# Patient Record
Sex: Female | Born: 1965 | Race: Black or African American | Hispanic: No | Smoking: Never smoker
Health system: Southern US, Community
[De-identification: ages and names within clinical notes are randomized; demographics above are authoritative.]

## PROBLEM LIST (undated history)

## (undated) DIAGNOSIS — I272 Pulmonary hypertension, unspecified: Secondary | ICD-10-CM

## (undated) DIAGNOSIS — Q211 Atrial septal defect: Secondary | ICD-10-CM

## (undated) DIAGNOSIS — D259 Leiomyoma of uterus, unspecified: Secondary | ICD-10-CM

## (undated) DIAGNOSIS — T7840XA Allergy, unspecified, initial encounter: Secondary | ICD-10-CM

## (undated) DIAGNOSIS — D509 Iron deficiency anemia, unspecified: Secondary | ICD-10-CM

## (undated) DIAGNOSIS — L0291 Cutaneous abscess, unspecified: Secondary | ICD-10-CM

## (undated) DIAGNOSIS — R600 Localized edema: Secondary | ICD-10-CM

## (undated) DIAGNOSIS — L723 Sebaceous cyst: Secondary | ICD-10-CM

## (undated) DIAGNOSIS — I10 Essential (primary) hypertension: Secondary | ICD-10-CM

## (undated) DIAGNOSIS — E559 Vitamin D deficiency, unspecified: Secondary | ICD-10-CM

## (undated) DIAGNOSIS — M519 Unspecified thoracic, thoracolumbar and lumbosacral intervertebral disc disorder: Secondary | ICD-10-CM

## (undated) DIAGNOSIS — K56609 Unspecified intestinal obstruction, unspecified as to partial versus complete obstruction: Secondary | ICD-10-CM

## (undated) DIAGNOSIS — D649 Anemia, unspecified: Secondary | ICD-10-CM

## (undated) DIAGNOSIS — E7800 Pure hypercholesterolemia, unspecified: Secondary | ICD-10-CM

## (undated) DIAGNOSIS — L089 Local infection of the skin and subcutaneous tissue, unspecified: Secondary | ICD-10-CM

## (undated) HISTORY — DX: Cutaneous abscess, unspecified: L02.91

## (undated) HISTORY — DX: Iron deficiency anemia, unspecified: D50.9

## (undated) HISTORY — DX: Local infection of the skin and subcutaneous tissue, unspecified: L08.9

## (undated) HISTORY — PX: ENDOMETRIAL ABLATION: SHX621

## (undated) HISTORY — DX: Sebaceous cyst: L72.3

## (undated) HISTORY — DX: Leiomyoma of uterus, unspecified: D25.9

## (undated) HISTORY — PX: OTHER SURGICAL HISTORY: SHX169

## (undated) HISTORY — DX: Essential (primary) hypertension: I10

## (undated) HISTORY — DX: Pulmonary hypertension, unspecified: I27.20

## (undated) HISTORY — DX: Atrial septal defect: Q21.1

## (undated) HISTORY — DX: Allergy, unspecified, initial encounter: T78.40XA

## (undated) HISTORY — DX: Morbid (severe) obesity due to excess calories: E66.01

## (undated) HISTORY — DX: Anemia, unspecified: D64.9

## (undated) HISTORY — PX: ASD REPAIR: SHX258

## (undated) HISTORY — DX: Localized edema: R60.0

## (undated) HISTORY — DX: Unspecified thoracic, thoracolumbar and lumbosacral intervertebral disc disorder: M51.9

## (undated) HISTORY — DX: Unspecified intestinal obstruction, unspecified as to partial versus complete obstruction: K56.609

## (undated) HISTORY — DX: Pure hypercholesterolemia, unspecified: E78.00

## (undated) HISTORY — DX: Vitamin D deficiency, unspecified: E55.9

---

## 1994-07-19 HISTORY — PX: TUBAL LIGATION: SHX77

## 1998-09-24 ENCOUNTER — Other Ambulatory Visit: Admission: RE | Admit: 1998-09-24 | Discharge: 1998-09-24 | Payer: Self-pay | Admitting: Obstetrics

## 2000-03-04 ENCOUNTER — Ambulatory Visit (HOSPITAL_COMMUNITY): Admission: RE | Admit: 2000-03-04 | Discharge: 2000-03-04 | Payer: Self-pay | Admitting: Orthopedic Surgery

## 2000-03-05 ENCOUNTER — Encounter: Payer: Self-pay | Admitting: Orthopedic Surgery

## 2000-03-05 ENCOUNTER — Ambulatory Visit (HOSPITAL_COMMUNITY): Admission: RE | Admit: 2000-03-05 | Discharge: 2000-03-05 | Payer: Self-pay | Admitting: Orthopedic Surgery

## 2000-04-25 ENCOUNTER — Other Ambulatory Visit: Admission: RE | Admit: 2000-04-25 | Discharge: 2000-04-25 | Payer: Self-pay | Admitting: Obstetrics

## 2000-04-28 ENCOUNTER — Encounter: Admission: RE | Admit: 2000-04-28 | Discharge: 2000-04-28 | Payer: Self-pay | Admitting: Obstetrics

## 2000-04-28 ENCOUNTER — Encounter: Payer: Self-pay | Admitting: Obstetrics

## 2000-07-07 ENCOUNTER — Encounter: Admission: RE | Admit: 2000-07-07 | Discharge: 2000-10-05 | Payer: Self-pay | Admitting: Anesthesiology

## 2000-08-09 ENCOUNTER — Other Ambulatory Visit: Admission: RE | Admit: 2000-08-09 | Discharge: 2000-08-09 | Payer: Self-pay | Admitting: Obstetrics

## 2003-04-30 ENCOUNTER — Encounter: Payer: Self-pay | Admitting: Obstetrics

## 2003-04-30 ENCOUNTER — Ambulatory Visit (HOSPITAL_COMMUNITY): Admission: RE | Admit: 2003-04-30 | Discharge: 2003-04-30 | Payer: Self-pay | Admitting: Obstetrics

## 2003-05-13 ENCOUNTER — Encounter: Payer: Self-pay | Admitting: Obstetrics

## 2003-05-13 ENCOUNTER — Encounter: Admission: RE | Admit: 2003-05-13 | Discharge: 2003-05-13 | Payer: Self-pay | Admitting: Obstetrics

## 2003-08-09 ENCOUNTER — Emergency Department (HOSPITAL_COMMUNITY): Admission: AD | Admit: 2003-08-09 | Discharge: 2003-08-09 | Payer: Self-pay | Admitting: Family Medicine

## 2003-08-11 ENCOUNTER — Emergency Department (HOSPITAL_COMMUNITY): Admission: AD | Admit: 2003-08-11 | Discharge: 2003-08-11 | Payer: Self-pay | Admitting: Family Medicine

## 2003-08-13 ENCOUNTER — Emergency Department (HOSPITAL_COMMUNITY): Admission: AD | Admit: 2003-08-13 | Discharge: 2003-08-13 | Payer: Self-pay | Admitting: Family Medicine

## 2003-10-24 ENCOUNTER — Encounter: Admission: RE | Admit: 2003-10-24 | Discharge: 2003-10-24 | Payer: Self-pay | Admitting: Obstetrics

## 2003-12-26 ENCOUNTER — Encounter: Payer: Self-pay | Admitting: Cardiology

## 2003-12-26 ENCOUNTER — Ambulatory Visit (HOSPITAL_COMMUNITY): Admission: RE | Admit: 2003-12-26 | Discharge: 2003-12-26 | Payer: Self-pay | Admitting: Cardiology

## 2004-04-10 ENCOUNTER — Ambulatory Visit: Payer: Self-pay | Admitting: Cardiology

## 2004-04-13 ENCOUNTER — Ambulatory Visit: Payer: Self-pay | Admitting: Cardiology

## 2004-04-20 ENCOUNTER — Ambulatory Visit: Payer: Self-pay | Admitting: Cardiology

## 2004-07-03 ENCOUNTER — Ambulatory Visit: Payer: Self-pay | Admitting: Cardiology

## 2004-09-08 ENCOUNTER — Ambulatory Visit (HOSPITAL_COMMUNITY): Admission: RE | Admit: 2004-09-08 | Discharge: 2004-09-08 | Payer: Self-pay | Admitting: Cardiology

## 2004-09-08 ENCOUNTER — Ambulatory Visit: Payer: Self-pay

## 2004-09-08 ENCOUNTER — Ambulatory Visit: Payer: Self-pay | Admitting: Cardiology

## 2004-09-10 ENCOUNTER — Ambulatory Visit: Payer: Self-pay | Admitting: Internal Medicine

## 2004-09-10 ENCOUNTER — Ambulatory Visit (HOSPITAL_COMMUNITY): Admission: RE | Admit: 2004-09-10 | Discharge: 2004-09-10 | Payer: Self-pay | Admitting: Cardiology

## 2004-09-10 ENCOUNTER — Encounter: Payer: Self-pay | Admitting: Internal Medicine

## 2004-09-24 ENCOUNTER — Ambulatory Visit: Payer: Self-pay | Admitting: Internal Medicine

## 2005-02-16 ENCOUNTER — Encounter: Admission: RE | Admit: 2005-02-16 | Discharge: 2005-02-16 | Payer: Self-pay | Admitting: Family Medicine

## 2005-03-19 ENCOUNTER — Ambulatory Visit: Payer: Self-pay | Admitting: Cardiology

## 2005-03-31 ENCOUNTER — Ambulatory Visit: Payer: Self-pay | Admitting: Internal Medicine

## 2005-04-07 ENCOUNTER — Ambulatory Visit: Payer: Self-pay

## 2006-07-20 ENCOUNTER — Ambulatory Visit (HOSPITAL_COMMUNITY): Admission: RE | Admit: 2006-07-20 | Discharge: 2006-07-20 | Payer: Self-pay | Admitting: Obstetrics

## 2006-08-11 ENCOUNTER — Ambulatory Visit: Payer: Self-pay | Admitting: Cardiology

## 2006-10-05 ENCOUNTER — Encounter (INDEPENDENT_AMBULATORY_CARE_PROVIDER_SITE_OTHER): Payer: Self-pay | Admitting: Specialist

## 2006-10-05 ENCOUNTER — Ambulatory Visit (HOSPITAL_COMMUNITY): Admission: RE | Admit: 2006-10-05 | Discharge: 2006-10-05 | Payer: Self-pay | Admitting: Obstetrics

## 2007-06-14 ENCOUNTER — Emergency Department (HOSPITAL_COMMUNITY): Admission: EM | Admit: 2007-06-14 | Discharge: 2007-06-15 | Payer: Self-pay | Admitting: Emergency Medicine

## 2007-12-21 ENCOUNTER — Ambulatory Visit: Payer: Self-pay | Admitting: Cardiology

## 2008-08-09 ENCOUNTER — Ambulatory Visit: Payer: Self-pay | Admitting: Cardiology

## 2008-09-02 ENCOUNTER — Encounter: Payer: Self-pay | Admitting: Cardiology

## 2008-09-02 ENCOUNTER — Ambulatory Visit: Payer: Self-pay

## 2008-09-16 LAB — CONVERTED CEMR LAB: Pap Smear: NORMAL

## 2009-01-27 ENCOUNTER — Telehealth: Payer: Self-pay | Admitting: Cardiology

## 2009-01-27 ENCOUNTER — Telehealth: Payer: Self-pay | Admitting: Internal Medicine

## 2009-02-07 ENCOUNTER — Encounter: Admission: RE | Admit: 2009-02-07 | Discharge: 2009-02-07 | Payer: Self-pay | Admitting: Family Medicine

## 2009-03-19 ENCOUNTER — Emergency Department (HOSPITAL_COMMUNITY): Admission: EM | Admit: 2009-03-19 | Discharge: 2009-03-19 | Payer: Self-pay | Admitting: Emergency Medicine

## 2009-08-19 ENCOUNTER — Ambulatory Visit: Payer: Self-pay | Admitting: Internal Medicine

## 2009-08-19 DIAGNOSIS — D509 Iron deficiency anemia, unspecified: Secondary | ICD-10-CM

## 2009-08-19 DIAGNOSIS — E559 Vitamin D deficiency, unspecified: Secondary | ICD-10-CM | POA: Insufficient documentation

## 2009-08-19 HISTORY — DX: Iron deficiency anemia, unspecified: D50.9

## 2009-08-19 LAB — CONVERTED CEMR LAB
Bilirubin Urine: NEGATIVE
Ketones, ur: NEGATIVE mg/dL
Leukocytes, UA: NEGATIVE
Nitrite: NEGATIVE
Specific Gravity, Urine: 1.015 (ref 1.000–1.030)
pH: 7.5 (ref 5.0–8.0)

## 2009-08-20 ENCOUNTER — Encounter: Payer: Self-pay | Admitting: Internal Medicine

## 2009-08-20 DIAGNOSIS — I272 Pulmonary hypertension, unspecified: Secondary | ICD-10-CM

## 2009-08-20 DIAGNOSIS — M5137 Other intervertebral disc degeneration, lumbosacral region: Secondary | ICD-10-CM | POA: Insufficient documentation

## 2009-08-20 DIAGNOSIS — J309 Allergic rhinitis, unspecified: Secondary | ICD-10-CM | POA: Insufficient documentation

## 2009-08-20 HISTORY — DX: Pulmonary hypertension, unspecified: I27.20

## 2009-08-21 ENCOUNTER — Telehealth: Payer: Self-pay | Admitting: Internal Medicine

## 2009-08-21 ENCOUNTER — Encounter (INDEPENDENT_AMBULATORY_CARE_PROVIDER_SITE_OTHER): Payer: Self-pay | Admitting: *Deleted

## 2009-08-25 ENCOUNTER — Telehealth: Payer: Self-pay | Admitting: Internal Medicine

## 2009-08-26 ENCOUNTER — Ambulatory Visit: Payer: Self-pay | Admitting: Internal Medicine

## 2009-08-27 ENCOUNTER — Encounter: Payer: Self-pay | Admitting: Internal Medicine

## 2009-08-27 ENCOUNTER — Telehealth: Payer: Self-pay | Admitting: Internal Medicine

## 2009-08-28 ENCOUNTER — Encounter: Payer: Self-pay | Admitting: Internal Medicine

## 2009-10-23 ENCOUNTER — Ambulatory Visit: Payer: Self-pay | Admitting: Internal Medicine

## 2009-10-23 ENCOUNTER — Encounter: Payer: Self-pay | Admitting: Internal Medicine

## 2010-01-02 ENCOUNTER — Ambulatory Visit (HOSPITAL_COMMUNITY): Admission: RE | Admit: 2010-01-02 | Discharge: 2010-01-02 | Payer: Self-pay | Admitting: Obstetrics

## 2010-01-08 ENCOUNTER — Ambulatory Visit: Payer: Self-pay | Admitting: Internal Medicine

## 2010-01-08 ENCOUNTER — Telehealth: Payer: Self-pay | Admitting: Internal Medicine

## 2010-01-08 DIAGNOSIS — L2089 Other atopic dermatitis: Secondary | ICD-10-CM

## 2010-01-08 DIAGNOSIS — L2084 Intrinsic (allergic) eczema: Secondary | ICD-10-CM | POA: Insufficient documentation

## 2010-01-09 ENCOUNTER — Telehealth: Payer: Self-pay | Admitting: Internal Medicine

## 2010-01-12 ENCOUNTER — Telehealth: Payer: Self-pay | Admitting: Internal Medicine

## 2010-04-06 ENCOUNTER — Ambulatory Visit: Payer: Self-pay | Admitting: Internal Medicine

## 2010-04-06 ENCOUNTER — Encounter: Payer: Self-pay | Admitting: Internal Medicine

## 2010-04-06 DIAGNOSIS — F329 Major depressive disorder, single episode, unspecified: Secondary | ICD-10-CM | POA: Insufficient documentation

## 2010-04-06 DIAGNOSIS — F3289 Other specified depressive episodes: Secondary | ICD-10-CM | POA: Insufficient documentation

## 2010-04-06 DIAGNOSIS — R9431 Abnormal electrocardiogram [ECG] [EKG]: Secondary | ICD-10-CM | POA: Insufficient documentation

## 2010-04-06 LAB — CONVERTED CEMR LAB
ALT: 14 units/L (ref 0–35)
Albumin: 3.5 g/dL (ref 3.5–5.2)
Basophils Absolute: 0 10*3/uL (ref 0.0–0.1)
CK-MB: 2.1 ng/mL (ref 0.3–4.0)
Calcium: 8.5 mg/dL (ref 8.4–10.5)
Chloride: 99 meq/L (ref 96–112)
Creatinine, Ser: 0.7 mg/dL (ref 0.4–1.2)
HCT: 33.1 % — ABNORMAL LOW (ref 36.0–46.0)
Hemoglobin: 11.1 g/dL — ABNORMAL LOW (ref 12.0–15.0)
Lymphs Abs: 2.2 10*3/uL (ref 0.7–4.0)
Monocytes Absolute: 0.4 10*3/uL (ref 0.1–1.0)
Neutro Abs: 4.7 10*3/uL (ref 1.4–7.7)
Potassium: 3.7 meq/L (ref 3.5–5.1)
RBC: 3.85 M/uL — ABNORMAL LOW (ref 3.87–5.11)
Sodium: 138 meq/L (ref 135–145)
TSH: 0.77 microintl units/mL (ref 0.35–5.50)
Total Bilirubin: 0.4 mg/dL (ref 0.3–1.2)
Total CK: 215 units/L — ABNORMAL HIGH (ref 7–177)
Total Protein: 7 g/dL (ref 6.0–8.3)
Vit D, 25-Hydroxy: 15 ng/mL — ABNORMAL LOW (ref 30–89)

## 2010-04-07 ENCOUNTER — Telehealth: Payer: Self-pay | Admitting: Internal Medicine

## 2010-04-08 ENCOUNTER — Telehealth: Payer: Self-pay | Admitting: Internal Medicine

## 2010-04-13 ENCOUNTER — Ambulatory Visit: Payer: Self-pay | Admitting: Internal Medicine

## 2010-04-21 ENCOUNTER — Telehealth: Payer: Self-pay | Admitting: Internal Medicine

## 2010-04-21 ENCOUNTER — Encounter: Payer: Self-pay | Admitting: Physician Assistant

## 2010-04-27 ENCOUNTER — Ambulatory Visit: Payer: Self-pay | Admitting: Internal Medicine

## 2010-06-04 ENCOUNTER — Telehealth: Payer: Self-pay | Admitting: Internal Medicine

## 2010-06-10 ENCOUNTER — Telehealth: Payer: Self-pay | Admitting: Internal Medicine

## 2010-06-15 ENCOUNTER — Ambulatory Visit: Payer: Self-pay | Admitting: Internal Medicine

## 2010-06-29 ENCOUNTER — Telehealth: Payer: Self-pay | Admitting: Internal Medicine

## 2010-07-06 ENCOUNTER — Telehealth: Payer: Self-pay | Admitting: Internal Medicine

## 2010-07-06 ENCOUNTER — Encounter (INDEPENDENT_AMBULATORY_CARE_PROVIDER_SITE_OTHER): Payer: Self-pay | Admitting: *Deleted

## 2010-07-27 ENCOUNTER — Emergency Department (HOSPITAL_COMMUNITY)
Admission: EM | Admit: 2010-07-27 | Discharge: 2010-07-27 | Payer: Self-pay | Source: Home / Self Care | Admitting: Emergency Medicine

## 2010-08-08 ENCOUNTER — Encounter: Payer: Self-pay | Admitting: Cardiology

## 2010-08-18 ENCOUNTER — Other Ambulatory Visit: Payer: Self-pay | Admitting: Internal Medicine

## 2010-08-18 ENCOUNTER — Encounter: Payer: Self-pay | Admitting: Internal Medicine

## 2010-08-18 ENCOUNTER — Ambulatory Visit
Admission: RE | Admit: 2010-08-18 | Discharge: 2010-08-18 | Payer: Self-pay | Source: Home / Self Care | Attending: Internal Medicine | Admitting: Internal Medicine

## 2010-08-18 DIAGNOSIS — R7309 Other abnormal glucose: Secondary | ICD-10-CM | POA: Insufficient documentation

## 2010-08-18 LAB — CBC WITH DIFFERENTIAL/PLATELET
Eosinophils Absolute: 0.1 10*3/uL (ref 0.0–0.7)
HCT: 34 % — ABNORMAL LOW (ref 36.0–46.0)
Hemoglobin: 11.3 g/dL — ABNORMAL LOW (ref 12.0–15.0)
MCHC: 33.3 g/dL (ref 30.0–36.0)
MCV: 81.7 fl (ref 78.0–100.0)
Neutro Abs: 4.1 10*3/uL (ref 1.4–7.7)
Platelets: 333 10*3/uL (ref 150.0–400.0)
RBC: 4.16 Mil/uL (ref 3.87–5.11)

## 2010-08-18 LAB — BASIC METABOLIC PANEL
BUN: 8 mg/dL (ref 6–23)
Calcium: 9.3 mg/dL (ref 8.4–10.5)
Creatinine, Ser: 0.7 mg/dL (ref 0.4–1.2)
GFR: 121.81 mL/min (ref >60.00)
Glucose, Bld: 104 mg/dL — ABNORMAL HIGH (ref 70–99)
Potassium: 4.2 mEq/L (ref 3.5–5.1)
Sodium: 142 mEq/L (ref 135–145)

## 2010-08-18 LAB — IBC PANEL
Iron: 29 ug/dL — ABNORMAL LOW (ref 42–145)
Saturation Ratios: 6.6 % — ABNORMAL LOW (ref 20.0–50.0)
Transferrin: 315.1 mg/dL (ref 212.0–360.0)

## 2010-08-18 NOTE — Progress Notes (Signed)
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 she looked up her Dx online and saw that she is still contagious. Pt is requesting MD's advise and clarification. please advise? Initial call taken by: Margaret Pyle, CMA,  August 27, 2009 1:17 PM  Follow-up for Phone Call        she would be only contagious for the first 7 days, and only to persons who have never had chickenpox in the past, which is usually children under 71 yo.  If she has significant exposure to persons such as this, she should avoiding touch contact, which is how it is spread.  we can give note off of work if this is needed, but since her rash started 5 days (by my last note) prior to feb 1 (her visit here) and is usually no longer contagious after 7 days, it is highly unlikely that she would need this at this time Follow-up by: Corwin Levins MD,  August 27, 2009 1:29 PM  Additional Follow-up for Phone Call Additional follow up Details #1::        left message on machine for pt to return my call. Margaret Pyle, CMA  August 27, 2009 2:40 PM.    Pt states that MD told her that she may also have Impetigo and she knows that this is infectious. Pt is very anxious and is requested a referral ASAP. please advise.  pt is requesting work note for secondary infection. Additional Follow-up by: Margaret Pyle, CMA,  August 27, 2009 3:00 PM     Appended Document: Clarification ok for work note  - done hardcopy to [employer] side B - dahlia   Appended Document: Clarification called pt left msg to call back  Appended Document: Clarification patient is requesting a dermatologist referral asap, called pt informed work note is up front for her  Appended Document: Clarification ok - refer derm

## 2010-08-18 NOTE — Progress Notes (Signed)
  Phone Note Call from Patient Call back at Home Phone [phone]   Caller: Patient Call For: Corwin Levins MD Summary of Call: Pt saw Dr Jonny Ruiz needs prescription for itching, Chicken Pox. Pt requests a cream to help with itching.Please advise. Initial call taken by: Verdell Face,  August 21, 2009 8:17 AM  Follow-up for Phone Call        ok for OTC caladryl as needed  Follow-up by: Corwin Levins MD,  August 21, 2009 12:42 PM  Additional Follow-up for Phone Call Additional follow up Details #1::        pt is requesting an Rx so she can use her public assistance flex card. pt is also requesting a note for work thru Friday, return Monday. please advise Additional Follow-up by: Margaret Pyle, CMA,  August 21, 2009 1:32 PM    Additional Follow-up for Phone Call Additional follow up Details #2::    done hardcopy to [facility] side B - dahlia   is pt okay to get work note?Margaret Pyle, CMA  August 21, 2009 2:49 PM  Follow-up by: Corwin Levins MD,  August 21, 2009 2:41 PM  Additional Follow-up for Phone Call Additional follow up Details #3:: Details for Additional Follow-up Action Taken: ok for work note as pt suggests - to robin Additional Follow-up by: Corwin Levins MD,  August 21, 2009 3:14 PM  New/Updated Medications: CALADRYL 1-8 % LOTN (PRAMOXINE-CALAMINE) use asd four times per day as needed Prescriptions: CALADRYL 1-8 % LOTN (PRAMOXINE-CALAMINE) use asd four times per day as needed  #1 x 1   Entered and Authorized by:   Corwin Levins MD   Signed by:   Corwin Levins MD on 08/21/2009   Method used:   Print then Give to Patient   RxID:   [id number]   Faxed prescription to [facility]. Left patient a work note at the front desk in the cabnet and called pt to informe prescription has been sent in and to come by and pickup work note.

## 2010-08-18 NOTE — Progress Notes (Signed)
Summary: Shingles  Phone Note Call from Patient   Caller: Patient [phone] Summary of Call: pt called stating that she is still having break out, and it has become a lot more painful. 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 Initial call taken by: Margaret Pyle, CMA,  August 25, 2009 9:36 AM  Follow-up for Phone Call        if the rash has "crusted over"  she should no longer be contagious (which should be about the 7 to 10 day mark);  remember the pain is from nerve irritation, so the pain itself does not mean she would be contagious Follow-up by: Corwin Levins MD,  August 25, 2009 10:14 AM  Additional Follow-up for Phone Call Additional follow up Details #1::        Patient notified. Additional Follow-up by: Lucious Groves,  August 25, 2009 1:56 PM

## 2010-08-18 NOTE — Assessment & Plan Note (Signed)
Summary: re-establish,rash/united hc/cd   Vital Signs:  Patient profile:   45 year old female Height:      66 inches Weight:      253 pounds BMI:     40.98 O2 Sat:      98 % on Room air Temp:     100.1 degrees F oral Pulse rate:   88 / minute BP sitting:   110 / 70  (left arm) Cuff size:   large  Vitals Entered ByZella Ball Ewing (August 19, 2009 2:49 PM)  O2 Flow:  Room air  Preventive Care Screening  Mammogram:    Date:  03/19/2009    Results:  normal   Pap Smear:    Date:  09/16/2008    Results:  normal   CC: new pt, re-established, rash/RE   CC:  new pt, re-established, and rash/RE.  History of Present Illness: here to re-establish;  unaware of fever., c/o rash x 5 days not painful unless she scratches it;  located left upper abd, chest and left side and back, with ? few spots to left neck and axillary and left medial arm;  all mildly uncomfortable only; mild tender to touch, and no high fever or chills;  has has mild URI symtpoms in the past wk with sinus ocngestion and post nasal gtt and mild non prod cough, but so mild she did not need OTC or other meds, all mostly resolved now.  No hx of shingles or similar rash in the past.  Pt denies other CP, sob, doe, wheezing, orthopnea, pnd, worsening LE edema, palps, dizziness or syncope Pt denies new neuro symptoms such as headache, facial or extremity weakness   also denies GU symptoms such as dysuria, freq, urgency and hematuria.    Problems Prior to Update: 1)  Fever Unspecified  (ICD-780.60) 2)  Uri  (ICD-465.9) 3)  Shingles  (ICD-053.9) 4)  Vitamin D Deficiency  (ICD-268.9) 5)  Anemia-iron Deficiency  (ICD-280.9)  Medications Prior to Update: 1)  Lasix 20 Mg Tabs (Furosemide) .Marland Kitchen.. 1 Tab Once Daily  Current Medications (verified): 1)  Lasix 20 Mg Tabs (Furosemide) .Marland Kitchen.. 1 Tab Once Daily 2)  Ecotrin 325 Mg Tbec (Aspirin) .Marland Kitchen.. 1p O Once Daily 3)  Valacyclovir Hcl 1 Gm Tabs (Valacyclovir Hcl) .Marland Kitchen.. 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 Tubal Ligation 1996  Family History: Reviewed history from 08/12/2008 and no changes required. Family History of Coronary Artery Disease: mother and father mother and father with HTN, DM sister with anxiety mother with breast cancer  Social History: Reviewed history from 08/12/2008 and no changes required. 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  Alcohol Use - no Regular Exercise - no Drug Use - no  Review of Systems  all otherwise negative per pt -  Physical Exam  General:  alert and overweight-appearing.   Head:  normocephalic and atraumatic.   Eyes:  vision grossly intact, pupils equal, and pupils round.   Ears:  R ear normal and L ear normal.   Nose:  no external deformity and no nasal discharge.   Mouth:  no gingival abnormalities and pharynx pink and moist.   Neck:  supple and no masses.   Lungs:  normal respiratory effort and normal breath sounds.   Heart:  normal rate and regular rhythm.   Abdomen:  soft, non-tender, and normal bowel sounds.   Msk:  no joint tenderness and no joint swelling.   Extremities:  no edema, no erythema  Skin:  left breast, chest , side, and left medial arm with grouped  nonpustular vesicles on erythem base Cervical Nodes:  No lymphadenopathy noted Axillary Nodes:  No palpable lymphadenopathy   Impression & Recommendations:  Problem # 1:  SHINGLES (ICD-053.9) mild symptomatic only;  for valtrex course, educated and reassured  Problem # 2:  FEVER UNSPECIFIED (ICD-780.60)  etilology not clear , as shingles most often does not have fever, - ? related to URI but asympt there as well; will check cxr and urine studies  Orders: T-2 View CXR, Same Day (71020.5TC) T-Culture, Urine ([id number]) TLB-Udip w/ Micro (81001-URINE)  Problem # 3:  URI (ICD-465.9)  Her updated medication list for this problem includes:    Ecotrin 325 Mg Tbec (Aspirin) .Marland Kitchen... 1p o once daily resolving, possibly the etiology for the fever, but not clear;  for tylenol prn  Complete Medication List: 1)  Lasix 20 Mg Tabs (Furosemide) .Marland Kitchen.. 1 tab once daily 2)  Ecotrin 325 Mg Tbec (Aspirin) .Marland Kitchen.. 1p o once daily 3)  Valacyclovir Hcl 1 Gm Tabs (Valacyclovir hcl) .Marland Kitchen.. 1 gm by mouth three times a day for 7 days  Other Orders: Tdap => 71yrs IM (09811) Admin 1st Vaccine (91478)  Patient Instructions: 1)  you had the tetanus shot today 2)  Please take all new medications as prescribed 3)  Continue all previous medications as before this visit  4)  please avoid small children or anyone who has not had chickenpox for 1 wk 5)  Please go to Radiology in the basement level for your X-Ray today  6)  Please go to the Lab in the basement for your urine tests today  7)  Please schedule a follow-up appointment in 1 year or sooner if needed Prescriptions: VALACYCLOVIR HCL 1 GM TABS (VALACYCLOVIR HCL) 1 gm by mouth three times a day for 7 days  #21 x 0   Entered and Authorized by:   Corwin Levins MD   Signed by:   Corwin Levins MD on 08/19/2009   Method used:   Print then Give to Patient   RxID:   [id number]    Immunizations Administered:  Tetanus Vaccine:    Vaccine Type: Tdap     Site: right deltoid    Mfr: GlaxoSmithKline    Dose: 0.5 ml    Route: IM    Given by: Robin Ewing    Exp. Date: 09/13/2011    Lot #: [id number]    VIS given: 06/06/07 version given August 19, 2009.

## 2010-08-18 NOTE — Miscellaneous (Signed)
  Clinical Lists Changes  Observations: Added new observation of ECHOINTERP: SUMMARY -  Overall left ventricular systolic function was normal. Left       ventricular ejection fraction was estimated to be 65 %. There       was no diagnostic evidence of left ventricular regional wall       motion abnormalities. -  Normal aortic valve -  Normal mitral valve -  The right ventricle was mildly dilated. Right ventricular       systolic function was mildly reduced. -  There is no obvious shunting at the level of the ASD repair.  IMPRESSIONS -  There is no obvious shunting at the level of the ASD repair.  (09/02/2009 9:43)      Echocardiogram  Procedure date:  09/02/2009  Findings:      SUMMARY -  Overall left ventricular systolic function was normal. Left       ventricular ejection fraction was estimated to be 65 %. There       was no diagnostic evidence of left ventricular regional wall       motion abnormalities. -  Normal aortic valve -  Normal mitral valve -  The right ventricle was mildly dilated. Right ventricular       systolic function was mildly reduced. -  There is no obvious shunting at the level of the ASD repair.  IMPRESSIONS -  There is no obvious shunting at the level of the ASD repair.

## 2010-08-18 NOTE — Assessment & Plan Note (Signed)
Summary: FU ON STRESS/ PT SPOKE WITH Jessica Davenport WED./N[employer]   Vital Signs:  Patient profile:   45 year old female Height:      67 inches Weight:      284 pounds BMI:     44.64 O2 Sat:      98 % on Room air Temp:     99.2 degrees F oral Pulse rate:   76 / minute Pulse rhythm:   regular Resp:     16 per minute BP sitting:   132 / 80  (left arm) Cuff size:   large  Vitals Entered By: Rock Nephew CMA (April 13, 2010 9:30 AM)  Nutrition Counseling: Patient's BMI is greater than 25 and therefore counseled on weight management options.  O2 Flow:  Room air CC: Patient c/o continued anxiety and stress/ discuss FMLA Is Patient Diabetic? No Pain Assessment Patient in pain? no       Does patient need assistance? Functional Status Self care Ambulation Normal   Primary Care Provider:  [name] MD  CC:  Patient c/o continued anxiety and stress/ discuss FMLA.  History of Present Illness: She returns for f/up and states that she needs a leave of absence from work for emotional reasons. She is feeling overwhelmed and has been having outbursts of crying. She has been on low dose Viibryd for one week and she has not had any side effects.  Preventive Screening-Counseling & Management  Alcohol-Tobacco     Alcohol drinks/day: 0     Smoking Status: never     Tobacco Counseling: not indicated; no tobacco use  Clinical Review Panels:  Prevention   Last Mammogram:  normal (02/16/2010)   Last Pap Smear:  normal (02/16/2010)  Immunizations   Last Tetanus Booster:  Tdap (08/19/2009)  Diabetes Management   Creatinine:  0.7 (04/06/2010)  CBC   WBC:  7.4 (04/06/2010)   RBC:  3.85 (04/06/2010)   Hgb:  11.1 (04/06/2010)   Hct:  33.1 (04/06/2010)   Platelets:  288.0 (04/06/2010)   MCV  86.0 (04/06/2010)   MCHC  33.7 (04/06/2010)   RDW  14.4 (04/06/2010)   PMN:  63.7 (04/06/2010)   Lymphs:  29.8 (04/06/2010)   Monos:  5.3 (04/06/2010)   Eosinophils:  0.9 (04/06/2010)  Basophil:  0.3 (04/06/2010)  Complete Metabolic Panel   Glucose:  108 (04/06/2010)   Sodium:  138 (04/06/2010)   Potassium:  3.7 (04/06/2010)   Chloride:  99 (04/06/2010)   CO2:  30 (04/06/2010)   BUN:  9 (04/06/2010)   Creatinine:  0.7 (04/06/2010)   Albumin:  3.5 (04/06/2010)   Total Protein:  7.0 (04/06/2010)   Calcium:  8.5 (04/06/2010)   Total Bili:  0.4 (04/06/2010)   Alk Phos:  56 (04/06/2010)   SGPT (ALT):  14 (04/06/2010)   SGOT (AST):  17 (04/06/2010)   Medications Prior to Update: 1)  Lasix 20 Mg Tabs (Furosemide) .Marland Kitchen.. 1 Tab Once Daily 2)  Ecotrin 325 Mg Tbec (Aspirin) .Marland Kitchen.. 1p O Once Daily 3)  Iron 325 (65 Fe) Mg Tabs (Ferrous Sulfate) .Marland Kitchen.. 1 Tab Once Daily 4)  Bromax 11 Mg Xr12h-Tab (Brompheniramine Maleate) .... One By Mouth Two Times A Day As Needed For Itching 5)  Desoximetasone 0.25 % Crea (Desoximetasone) .... Applt To Aa Two Times A Day Prn 6)  Viibryd 10 Mg Tabs (Vilazodone Hcl) .... Starter Kit, One Once Daily  Current Medications (verified): 1)  Lasix 20 Mg Tabs (Furosemide) .Marland Kitchen.. 1 Tab Once Daily 2)  Ecotrin  325 Mg Tbec (Aspirin) .Marland Kitchen.. 1p O Once Daily 3)  Iron 325 (65 Fe) Mg Tabs (Ferrous Sulfate) .Marland Kitchen.. 1 Tab Once Daily 4)  Desoximetasone 0.25 % Crea (Desoximetasone) .... 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  Alcohol Use - no Regular Exercise -  no Drug Use - no  Risk Factors: Alcohol Use: 0 (04/13/2010) Exercise: no (08/12/2008)  Risk Factors: Smoking Status: never (04/13/2010)  Family History: Reviewed history from 08/19/2009 and no changes required. Family History of Coronary Artery Disease: mother and father mother and father with HTN, DM sister with anxiety mother with breast cancer  Social History: Reviewed history from 08/19/2009 and no changes required. 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  Alcohol Use - no Regular Exercise - no Drug Use - no  Review of Systems       The patient complains of weight gain and depression.  The patient denies anorexia, fever, weight loss, chest pain, syncope, dyspnea on exertion, peripheral edema, prolonged cough, headaches, hemoptysis, and abdominal pain.   Psych:  Complains of anxiety, depression, easily angered, easily tearful, irritability, and mental problems; denies alternate hallucination ( auditory/visual), panic attacks, sense of great danger, suicidal thoughts/plans, thoughts of violence, unusual visions or sounds, and thoughts /plans of harming others.  Physical Exam  General:  alert, well-developed, well-nourished, well-hydrated, appropriate dress, normal appearance, healthy-appearing, and cooperative to examination.   Head:  normocephalic, atraumatic, no abnormalities observed, and no abnormalities palpated.   Mouth:  good dentition, no gingival abnormalities, no dental plaque, and pharynx pink and moist.   Neck:  No deformities, masses, or tenderness noted. Lungs:  normal respiratory effort, no intercostal retractions, no accessory muscle use, normal breath sounds, no dullness, no fremitus, no crackles, and no wheezes.   Heart:  normal rate, regular rhythm, no murmur, no gallop, no rub, and no JVD.   Abdomen:  soft, non-tender, normal bowel sounds, no distention, no masses, no guarding, no rigidity, no rebound tenderness, no abdominal  hernia, no inguinal hernia, no hepatomegaly, and no splenomegaly.   Msk:  No deformity or scoliosis noted of thoracic or lumbar spine.   Pulses:  R and L carotid,radial,femoral,dorsalis pedis and posterior tibial pulses are full and equal bilaterally Extremities:  No clubbing, cyanosis, edema, or deformity noted with normal full range of motion of all joints.   Neurologic:  No cranial nerve deficits noted. Station and gait are normal. Plantar reflexes are down-going bilaterally. DTRs are symmetrical throughout. Sensory, motor and coordinative functions appear intact. Skin:  Intact without suspicious lesions or rashes Cervical Nodes:  no anterior cervical adenopathy and no posterior cervical adenopathy.   Psych:  Cognition and judgment appear intact. Alert and cooperative with normal attention span and concentration. No apparent delusions, illusions, hallucinations   Impression & Recommendations:  Problem # 1:  DEPRESSIVE DISORDER (ICD-311)  FMLA paperwork completed, continue Viibryd with a slow dose increase according to  the starter pak, start psychotherapy The following medications were removed from the medication list:    Viibryd 10 Mg Tabs (Vilazodone hcl) ..... Starter kit, one once daily  Orders: Psychology Referral (Psychology)  Complete Medication List: 1)  Lasix 20 Mg Tabs (Furosemide) .Marland Kitchen.. 1 tab once daily 2)  Ecotrin 325 Mg Tbec (Aspirin) .Marland Kitchen.. 1p o once daily 3)  Iron 325 (65 Fe) Mg Tabs (Ferrous sulfate) .Marland Kitchen.. 1 tab once daily 4)  Desoximetasone 0.25 % Crea (Desoximetasone) .... Applt to aa two times a day prn  Patient Instructions: 1)  Please schedule a follow-up appointment in 2 weeks. 2)  It is important that you exercise regularly at least 20 minutes 5 times a week. If you develop chest pain, have severe difficulty breathing, or feel very tired , stop exercising immediately and seek medical attention. 3)  You need to lose weight. Consider a lower calorie diet and regular  exercise.   Preventive Care Screening  Mammogram:    Date:  02/16/2010    Results:  normal   Pap Smear:    Date:  02/16/2010    Results:  normal

## 2010-08-18 NOTE — Letter (Signed)
Summary: Out of Work  [facility]  [address] [place], [place] [address]   Phone: [phone]  Fax: [phone]    August 21, 2009   Employee:  LYNSI DOONER Holleran    To Whom It May Concern:   For Medical reasons, please excuse the above named employee from work for the following dates:  Start:   08/20/2009  End:     08/22/2009  If you need additional information, please feel free to contact our office.         Sincerely,    Dr Oliver Barre

## 2010-08-18 NOTE — Assessment & Plan Note (Signed)
Summary: FOOT PROBLEM/ TRANSFER FROM DR JOHN/NWS   Vital Signs:  Patient profile:   45 year old female Height:      66.5 inches Weight:      272.75 pounds BMI:     43.52 O2 Sat:      99 % on Room air Temp:     98.7 degrees F oral Pulse rate:   77 / minute Pulse rhythm:   regular Resp:     16 per minute BP sitting:   118 / 78  (left arm) Cuff size:   large  Vitals Entered By: Rock Nephew CMA (January 08, 2010 8:40 AM)  Nutrition Counseling: Patient's BMI is greater than 25 and therefore counseled on weight management options.  O2 Flow:  Room air CC: rash/itching, Rash Is Patient Diabetic? No Pain Assessment Patient in pain? no        Primary Care Provider:  [name] MD  CC:  rash/itching and Rash.  History of Present Illness:  Rash      This is a 45 year old woman who presents with Rash.  The symptoms began 2 days ago.  The severity is described as mild.  The patient reports hives, but denies macules, papules, nodules, welts, pustules, blisters, and ulcers.  The rash is located on the right arm and right foot.  The rash is worse with heat and worse with scratching.  The patient denies the following symptoms: fever, headache, facial swelling, tongue swelling, burning, difficulty breathing, abdominal pain, nausea, vomiting, diarrhea, dizziness, and sore throat.  The patient denies history of recent tick bite, recent tick exposure, other insect bite, recent infection, recent antibiotic use, new medication, new clothing, new topical exposure, recent travel, and pet/animal contact.    Preventive Screening-Counseling & Management  Alcohol-Tobacco     Alcohol drinks/day: 0     Smoking Status: never  Hep-HIV-STD-Contraception     Hepatitis Risk: no risk noted     HIV Risk: no risk noted     STD Risk: no risk noted      Sexual History:  currently monogamous.        Drug Use:  no.    Medications Prior to Update: 1)  Lasix 20 Mg Tabs (Furosemide) .Marland Kitchen.. 1 Tab Once  Daily 2)  Ecotrin 325 Mg Tbec (Aspirin) .Marland Kitchen.. 1p O Once Daily 3)  Hydroxyzine Hcl 25 Mg Tabs (Hydroxyzine Hcl) .Marland Kitchen.. 1 - 2 By Mouth Four Times Per Day As Needed For Itch 4)  Methylprednisolone (Pak) 4 Mg Tabs (Methylprednisolone) .... Follow Directions 5)  Cefuroxime Axetil 500 Mg Tabs (Cefuroxime Axetil) .Marland Kitchen.. 1 Tab Two Times A Day For 10 Days 6)  Allerx Df 4-2.5 & 8-2.5 Mg Misc (Chlorpheniramine-Methscop) .... Take 1 White Am Tab in Morning Take ` Blue Pm Tab in Evening 7)  Antibiotic Ear 3.5-10000-1 Soln (Neomycin-Polymyxin-Hc) .... 5 Drops in Affected Ear Four Times A Day For 10 Days 8)  Iron 325 (65 Fe) Mg Tabs (Ferrous Sulfate) .Marland Kitchen.. 1 Tab Once Daily 9)  Mytussin Ac 100-10 Mg/61ml Syrp (Guaifenesin-Codeine) .... 5-10 Ml By Mouth Qid As Needed For Cough  Current Medications (verified): 1)  Lasix 20 Mg Tabs (Furosemide) .Marland Kitchen.. 1 Tab Once Daily 2)  Ecotrin 325 Mg Tbec (Aspirin) .Marland Kitchen.. 1p O Once Daily 3)  Iron 325 (65 Fe) Mg Tabs (Ferrous Sulfate) .Marland Kitchen.. 1 Tab Once Daily 4)  Triamcinolone Acetonide 0.5 % Crea (Triamcinolone Acetonide) .... Apply To Aa Two Times A Day As Needed For Rash/itching 5)  Lodrane 12 Hour 6 Mg Xr12h-Tab (Brompheniramine Maleate) .... 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  Alcohol Use - no Regular Exercise - no Drug Use - no  Risk  Factors: Alcohol Use: 0 (01/08/2010) Exercise: no (08/12/2008)  Risk Factors: Smoking Status: never (01/08/2010)  Family History: Reviewed history from 08/19/2009 and no changes required. Family History of Coronary Artery Disease: mother and father mother and father with HTN, DM sister with anxiety mother with breast cancer  Social History: Reviewed history from 08/19/2009 and no changes required. 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  Alcohol Use - no Regular Exercise - no Drug Use - no Sexual History:  currently monogamous  Review of Systems       The patient complains of weight gain.  The patient denies anorexia, fever, weight loss, chest pain, syncope, dyspnea on exertion, peripheral edema, prolonged cough, headaches, hemoptysis, abdominal pain, suspicious skin lesions, and enlarged lymph nodes.    Physical Exam  General:  alert, well-developed, well-nourished, well-hydrated, appropriate dress, normal appearance, healthy-appearing, cooperative to examination, and overweight-appearing.   Head:  normocephalic and atraumatic.   Eyes:  vision grossly intact and no injection.   Nose:  External nasal examination shows no deformity or inflammation. Nasal mucosa are pink and moist without lesions or exudates. Mouth:  Oral mucosa and oropharynx without lesions or exudates.  Teeth in good repair. Neck:  supple, full ROM, right lipoma 1.5cm, no thyromegaly, no JVD, normal carotid upstroke, no carotid bruits, no cervical lymphadenopathy, and no neck tenderness.   Lungs:  normal respiratory effort, no intercostal retractions, no accessory muscle use, normal breath sounds, no dullness, no fremitus, no crackles, and no wheezes.   Heart:  normal rate, regular rhythm, no murmur, no gallop, no rub, and no JVD.   Abdomen:  soft, non-tender, normal bowel sounds, no distention, no masses, no hepatomegaly, and no splenomegaly.   Msk:  normal ROM, no joint tenderness, no  joint swelling, no joint warmth, no redness over joints, no joint deformities, no joint instability, and no crepitation.   Pulses:  R and L carotid,radial,femoral,dorsalis pedis and posterior tibial pulses are full and equal bilaterally Extremities:  No clubbing, cyanosis, edema, or deformity noted with normal full range of motion of all joints.   Neurologic:  No cranial nerve deficits noted. Station and gait are normal. Plantar reflexes are down-going bilaterally. DTRs are symmetrical throughout. Sensory, motor and coordinative functions appear intact. Skin:  macular rash  with erythema in right antecubital fossa but no induration, exudate, or vesicles and right medial foot and lipoma.  turgor normal, no suspicious lesions, no ecchymoses, no petechiae, no purpura, no ulcerations, no edema, macular rash, and lipoma.   Cervical Nodes:  no anterior cervical adenopathy and no posterior cervical adenopathy.   Axillary Nodes:  no R axillary adenopathy and no L axillary adenopathy.   Inguinal Nodes:  no R inguinal adenopathy and no L inguinal adenopathy.   Psych:  Cognition and judgment appear intact. Alert and cooperative with normal attention span and concentration. No apparent delusions, illusions, hallucinations   Impression & Recommendations:  Problem # 1:  ECZEMA, ATOPIC (ICD-691.8) Assessment New  try depo-medrol IM and Lodrane as needed  Her updated medication list for this problem includes:    Triamcinolone Acetonide 0.5 % Crea (Triamcinolone acetonide) .Marland Kitchen... Apply to aa two times a day as needed for rash/itching  Orders: Admin of Therapeutic Inj  intramuscular or subcutaneous (16109) Depo- Medrol 40mg  (J1030) Depo- Medrol 80mg  (J1040)  Complete Medication List: 1)  Lasix 20 Mg Tabs (Furosemide) .Marland Kitchen.. 1 tab once daily 2)  Ecotrin 325 Mg Tbec (Aspirin) .Marland Kitchen.. 1p o once daily 3)  Iron 325 (65 Fe) Mg Tabs (Ferrous sulfate) .Marland Kitchen.. 1 tab once daily 4)  Triamcinolone Acetonide 0.5 % Crea  (Triamcinolone acetonide) .... Apply to aa two times a day as needed for rash/itching 5)  Lodrane 12 Hour 6 Mg Xr12h-tab (Brompheniramine maleate) .... One by mouth two times a day as needed for rash and itching  Patient Instructions: 1)  Please schedule a follow-up appointment in 1 month. Prescriptions: LODRANE 12 HOUR 6 MG XR12H-TAB (BROMPHENIRAMINE MALEATE) One by mouth two times a day as needed for rash and itching  #35 x 2   Entered and Authorized by:   [name] MD   Signed by:   [name] MD on 01/08/2010   Method used:   Electronically to        [facility] [phone]* (retail)       [address]       [place], [place]  [address]       Ph: [phone]       Fax: [phone]   RxID:   [id number] TRIAMCINOLONE ACETONIDE 0.5 % CREA (TRIAMCINOLONE ACETONIDE) Apply to AA two times a day as needed for rash/itching  #45 gms x 2   Entered and Authorized by:   [name] MD   Signed by:   [name] MD on 01/08/2010   Method used:   Electronically to        [facility] [phone]* (retail)       [address]       [place], [place]  [address]       Ph: [phone]       Fax: [phone]   RxID:   [id number]    Medication Administration  Injection # 1:    Medication: Depo- Medrol 80mg     Diagnosis: ECZEMA, ATOPIC (ICD-691.8)    Route: IM    Site: R deltoid    Exp Date: 10/2012    Lot #: [id number]    Mfr: pfizer    Patient tolerated injection without complications    Given by: Rock Nephew CMA (January 08, 2010 9:43 AM)  Injection # 2:    Medication: Depo- Medrol 40mg     Diagnosis: ECZEMA, ATOPIC (ICD-691.8)    Route: IM    Site: R deltoid    Exp Date: 10/2012    Lot #: [id number]    Mfr: pfizer    Patient tolerated injection without complications    Given by: Rock Nephew CMA (January 08, 2010 9:43 AM)  Orders Added: 1)  Est. Patient Level IV [10932] 2)  Admin of Therapeutic Inj  intramuscular or subcutaneous [96372] 3)  Depo- Medrol  40mg  [J1030] 4)  Depo- Medrol 80mg  [  J1040]

## 2010-08-18 NOTE — Assessment & Plan Note (Signed)
Summary: 2 WK ROV /NWS  #   Vital Signs:  Patient profile:   45 year old female Height:      67 inches Weight:      284 pounds BMI:     44.64 O2 Sat:      99 % on Room air Temp:     98.8 degrees F oral Pulse rate:   60 / minute Pulse rhythm:   regular Resp:     16 per minute BP sitting:   124 / 88  (left arm) Cuff size:   large  Vitals Entered By: Rock Nephew CMA (April 27, 2010 10:08 AM)  Nutrition Counseling: Patient's BMI is greater than 25 and therefore counseled on weight management options.  O2 Flow:  Room air CC: follow-up visit, Insomnia Is Patient Diabetic? No Pain Assessment Patient in pain? no       Does patient need assistance? Functional Status Self care Ambulation Normal   Primary Care Provider:  [name] MD  CC:  follow-up visit and Insomnia.  History of Present Illness:  Insomnia      This is a 45 year old woman who presents with Insomnia.  The symptoms began 2 weeks ago.  The severity is described as mild.  The patient reports difficulty falling asleep, frequent awakening, early awakening, and daytime somnolence, but denies nightmares, leg movements, snoring, and apnea noted by partner.  Associated symptoms include weight gain, headache, and depression.  Risk factors for insomnia include obesity.  Treatments tried in the past and found to be ineffective incude behavior modification, hypnosis, and relaxation techniques.  She is getting therapy from her pastor.  She had an episode of hives several weeks ago while she was taking Vicodin for dental surgery but that problem has resolved.  Preventive Screening-Counseling & Management  Alcohol-Tobacco     Alcohol drinks/day: 0     Smoking Status: never     Tobacco Counseling: not indicated; no tobacco use  Hep-HIV-STD-Contraception     Hepatitis Risk: no risk noted     HIV Risk: no risk noted     STD Risk: no risk noted      Sexual History:  currently monogamous.        Drug Use:  no.     Clinical Review Panels:  Prevention   Last Mammogram:  normal (02/16/2010)   Last Pap Smear:  normal (02/16/2010)  Immunizations   Last Tetanus Booster:  Tdap (08/19/2009)  Diabetes Management   Creatinine:  0.7 (04/06/2010)  CBC   WBC:  7.4 (04/06/2010)   RBC:  3.85 (04/06/2010)   Hgb:  11.1 (04/06/2010)   Hct:  33.1 (04/06/2010)   Platelets:  288.0 (04/06/2010)   MCV  86.0 (04/06/2010)   MCHC  33.7 (04/06/2010)   RDW  14.4 (04/06/2010)   PMN:  63.7 (04/06/2010)   Lymphs:  29.8 (04/06/2010)   Monos:  5.3 (04/06/2010)   Eosinophils:  0.9 (04/06/2010)   Basophil:  0.3 (04/06/2010)  Complete Metabolic Panel   Glucose:  108 (04/06/2010)   Sodium:  138 (04/06/2010)   Potassium:  3.7 (04/06/2010)   Chloride:  99 (04/06/2010)   CO2:  30 (04/06/2010)   BUN:  9 (04/06/2010)   Creatinine:  0.7 (04/06/2010)   Albumin:  3.5 (04/06/2010)   Total Protein:  7.0 (04/06/2010)   Calcium:  8.5 (04/06/2010)   Total Bili:  0.4 (04/06/2010)   Alk Phos:  56 (04/06/2010)   SGPT (ALT):  14 (04/06/2010)   SGOT (  AST):  17 (04/06/2010)   Medications Prior to Update: 1)  Lasix 20 Mg Tabs (Furosemide) .Marland Kitchen.. 1 Tab Once Daily 2)  Ecotrin 325 Mg Tbec (Aspirin) .Marland Kitchen.. 1p O Once Daily 3)  Iron 325 (65 Fe) Mg Tabs (Ferrous Sulfate) .Marland Kitchen.. 1 Tab Once Daily 4)  Desoximetasone 0.25 % Crea (Desoximetasone) .... Applt To Aa Two Times A Day Prn 5)  Hydroxyzine Hcl 10 Mg Tabs (Hydroxyzine Hcl) .Marland Kitchen.. 1-2 By Mouth Q6 Hours As Needed For Hives  Current Medications (verified): 1)  Lasix 20 Mg Tabs (Furosemide) .Marland Kitchen.. 1 Tab Once Daily 2)  Ecotrin 325 Mg Tbec (Aspirin) .Marland Kitchen.. 1p O Once Daily 3)  Iron 325 (65 Fe) Mg Tabs (Ferrous Sulfate) .Marland Kitchen.. 1 Tab Once Daily 4)  Desoximetasone 0.25 % Crea (Desoximetasone) .... Applt To Aa Two Times A Day Prn 5)  Hydroxyzine Hcl 10 Mg Tabs (Hydroxyzine Hcl) .Marland Kitchen.. 1-2 By Mouth Q6 Hours As Needed For Hives 6)  Viibryd 40 Mg Tabs (Vilazodone Hcl) .... One By Mouth Once Daily 7)   Trazodone Hcl 50 Mg Tabs (Trazodone Hcl) .... 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  Alcohol Use - no Regular Exercise - no Drug Use - no  Risk Factors: Alcohol Use: 0 (04/27/2010) Exercise: no (08/12/2008)  Risk Factors: Smoking Status: never (04/27/2010)  Family History: Reviewed history from 08/19/2009 and no changes required. Family History of Coronary Artery Disease: mother and father mother and father with HTN, DM sister with anxiety mother with breast cancer  Social History: Reviewed history from 08/19/2009 and no changes required. 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  Alcohol Use - no Regular Exercise - no Drug Use - no  Review of Systems       The patient complains of weight gain.  The patient denies chest pain, dyspnea on exertion, peripheral edema, prolonged cough, abdominal pain, muscle weakness, difficulty walking, and depression.   Derm:  Denies changes in color of skin, changes in nail beds, dryness, flushing, hair loss, itching, lesion(s), and rash. Psych:  Complains of depression; denies alternate hallucination ( auditory/visual), anxiety, easily angered, easily tearful, irritability, mental problems, panic attacks, sense of great danger, suicidal thoughts/plans, thoughts of violence, unusual visions or sounds, and  thoughts /plans of harming others.  Physical Exam  General:  alert, well-developed, well-nourished, well-hydrated, appropriate dress, normal appearance, healthy-appearing, and cooperative to examination.  overweight-appearing.   Head:  normocephalic, atraumatic, no abnormalities observed, and no abnormalities palpated.   Mouth:  good dentition, no gingival abnormalities, no dental plaque, and pharynx pink and moist.   Neck:  No deformities, masses, or tenderness noted. Lungs:  normal respiratory effort, no intercostal retractions, no accessory muscle use, normal breath sounds, no dullness, no fremitus, no crackles, and no wheezes.   Heart:  normal rate, regular rhythm, no murmur, no gallop, no rub, and no JVD.   Abdomen:  soft, non-tender, normal bowel  sounds, no distention, no masses, no guarding, no rigidity, no rebound tenderness, no abdominal hernia, no inguinal hernia, no hepatomegaly, and no splenomegaly.   Msk:  No deformity or scoliosis noted of thoracic or lumbar spine.   Pulses:  R and L carotid,radial,femoral,dorsalis pedis and posterior tibial pulses are full and equal bilaterally Extremities:  No clubbing, cyanosis, edema, or deformity noted with normal full range of motion of all joints.   Neurologic:  No cranial nerve deficits noted. Station and gait are normal. Plantar reflexes are down-going bilaterally. DTRs are symmetrical throughout. Sensory, motor and coordinative functions appear intact. Skin:  Intact without suspicious lesions or rashes Psych:  Cognition and judgment appear intact. Alert and cooperative with normal attention span and concentration. No apparent delusions, illusions, hallucinations   Impression & Recommendations:  Problem # 1:  DEPRESSIVE DISORDER (ICD-311) Assessment Unchanged  her FMLA paperwork was completed again, she was not agreeable to returning to work due to the recent insomnia, will treat depression and insomnia for 2 months and evaluate if she  will return to work at that time Her updated medication list for this problem includes:    Hydroxyzine Hcl 10 Mg Tabs (Hydroxyzine hcl) .Marland Kitchen... 1-2 by mouth q6 hours as needed for hives    Viibryd 40 Mg Tabs (Vilazodone hcl) ..... One by mouth once daily    Trazodone Hcl 50 Mg Tabs (Trazodone hcl) ..... One by mouth at bedtime as needed for insomnia  Discussed treatment options, including trial of antidpressant medication. Will refer to behavioral health. Follow-up call in in 24-48 hours and recheck in 2 weeks, sooner as needed. Patient agrees to call if any worsening of symptoms or thoughts of doing harm arise. Verified that the patient has no suicidal ideation at this time.   Problem # 2:  ECZEMA, ATOPIC (ICD-691.8) Assessment: Improved  Her updated medication list for this problem includes:    Desoximetasone 0.25 % Crea (Desoximetasone) .Marland Kitchen... Applt to aa two times a day prn  Complete Medication List: 1)  Lasix 20 Mg Tabs (Furosemide) .Marland Kitchen.. 1 tab once daily 2)  Ecotrin 325 Mg Tbec (Aspirin) .Marland Kitchen.. 1p o once daily 3)  Iron 325 (65 Fe) Mg Tabs (Ferrous sulfate) .Marland Kitchen.. 1 tab once daily 4)  Desoximetasone 0.25 % Crea (Desoximetasone) .... Applt to aa two times a day prn 5)  Hydroxyzine Hcl 10 Mg Tabs (Hydroxyzine hcl) .Marland Kitchen.. 1-2 by mouth q6 hours as needed for hives 6)  Viibryd 40 Mg Tabs (Vilazodone hcl) .... One by mouth once daily 7)  Trazodone Hcl 50 Mg Tabs (Trazodone hcl) .... One by mouth at bedtime as needed for insomnia  Patient Instructions: 1)  Please schedule a follow-up appointment in 2 months. 2)  It is important that you exercise regularly at least 20 minutes 5 times a week. If you develop chest pain, have severe difficulty breathing, or feel very tired , stop exercising immediately and seek medical attention. 3)  You need to lose weight. Consider a lower calorie diet and regular exercise.  Prescriptions: TRAZODONE HCL 50 MG TABS (TRAZODONE HCL) One by mouth at bedtime as needed for  insomnia  #30 x 11   Entered and Authorized by:   [name] MD   Signed by:   [name] MD on 04/27/2010   Method used:   Electronically to        [facility] [phone]* (retail)       [address]  [address]       [place], [place]  [address]       Ph: [phone]       Fax: [phone]   RxID:   [id number] VIIBRYD 40 MG TABS (VILAZODONE HCL) One by mouth once daily  #110 x 0   Entered and Authorized by:   [name] MD   Signed by:   [name] MD on 04/27/2010   Method used:   Samples Given   RxID:   [id number]

## 2010-08-18 NOTE — Assessment & Plan Note (Signed)
Summary: anxiety/cd   Vital Signs:  Patient profile:   45 year old female Menstrual status:  irregular LMP:     05/14/2010 Height:      67 inches Weight:      292 pounds BMI:     45.90 O2 Sat:      99 % on Room air Temp:     99.2 degrees F oral Pulse rate:   91 / minute Pulse rhythm:   regular Resp:     16 per minute BP sitting:   128 / 80  (left arm) Cuff size:   large  Vitals Entered By: Rock Nephew CMA (June 15, 2010 3:44 PM)  Nutrition Counseling: Patient's BMI is greater than 25 and therefore counseled on weight management options.  O2 Flow:  Room air  Primary Care Provider:  [name] MD   History of Present Illness: She did not tolerate Viibryd and wants to try a new med for depression.  Preventive Screening-Counseling & Management  Alcohol-Tobacco     Alcohol drinks/day: 0     Alcohol Counseling: not indicated; patient does not drink     Smoking Status: never     Tobacco Counseling: not indicated; no tobacco use  Hep-HIV-STD-Contraception     Hepatitis Risk: no risk noted     HIV Risk: no risk noted     STD Risk: no risk noted      Sexual History:  currently monogamous.        Drug Use:  no.    Clinical Review Panels:  Prevention   Last Mammogram:  normal (02/16/2010)   Last Pap Smear:  normal (02/16/2010)  Immunizations   Last Tetanus Booster:  Tdap (08/19/2009)  Diabetes Management   Creatinine:  0.7 (04/06/2010)  CBC   WBC:  7.4 (04/06/2010)   RBC:  3.85 (04/06/2010)   Hgb:  11.1 (04/06/2010)   Hct:  33.1 (04/06/2010)   Platelets:  288.0 (04/06/2010)   MCV  86.0 (04/06/2010)   MCHC  33.7 (04/06/2010)   RDW  14.4 (04/06/2010)   PMN:  63.7 (04/06/2010)   Lymphs:  29.8 (04/06/2010)   Monos:  5.3 (04/06/2010)   Eosinophils:  0.9 (04/06/2010)   Basophil:  0.3 (04/06/2010)  Complete Metabolic Panel   Glucose:  108 (04/06/2010)   Sodium:  138 (04/06/2010)   Potassium:  3.7 (04/06/2010)   Chloride:  99 (04/06/2010)   CO2:   30 (04/06/2010)   BUN:  9 (04/06/2010)   Creatinine:  0.7 (04/06/2010)   Albumin:  3.5 (04/06/2010)   Total Protein:  7.0 (04/06/2010)   Calcium:  8.5 (04/06/2010)   Total Bili:  0.4 (04/06/2010)   Alk Phos:  56 (04/06/2010)   SGPT (ALT):  14 (04/06/2010)   SGOT (AST):  17 (04/06/2010)   Medications Prior to Update: 1)  Lasix 20 Mg Tabs (Furosemide) .Marland Kitchen.. 1 Tab Once Daily 2)  Ecotrin 325 Mg Tbec (Aspirin) .Marland Kitchen.. 1p O Once Daily 3)  Iron 325 (65 Fe) Mg Tabs (Ferrous Sulfate) .Marland Kitchen.. 1 Tab Once Daily 4)  Desoximetasone 0.25 % Crea (Desoximetasone) .... Applt To Aa Two Times A Day Prn 5)  Hydroxyzine Hcl 10 Mg Tabs (Hydroxyzine Hcl) .Marland Kitchen.. 1-2 By Mouth Q6 Hours As Needed For Hives 6)  Trazodone Hcl 150 Mg Tabs (Trazodone Hcl) .... One By Mouth At Bedtime  Current Medications (verified): 1)  Lasix 20 Mg Tabs (Furosemide) .Marland Kitchen.. 1 Tab Once Daily 2)  Ecotrin 325 Mg Tbec (Aspirin) .Marland Kitchen.. 1p O Once Daily 3)  Iron 325 (65 Fe) Mg Tabs (Ferrous Sulfate) .Marland Kitchen.. 1 Tab Once Daily 4)  Hydroxyzine Hcl 10 Mg Tabs (Hydroxyzine Hcl) .Marland Kitchen.. 1-2 By Mouth Q6 Hours As Needed For Hives 5)  Trazodone Hcl 150 Mg Tabs (Trazodone Hcl) .... One By Mouth At Bedtime 6)  Sertraline Hcl 50 Mg Tabs (Sertraline Hcl) .... 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  Alcohol Use - no Regular Exercise - no Drug Use -  no  Risk Factors: Alcohol Use: 0 (06/15/2010) Exercise: no (08/12/2008)  Risk Factors: Smoking Status: never (06/15/2010)  Family History: Reviewed history from 08/19/2009 and no changes required. Family History of Coronary Artery Disease: mother and father mother and father with HTN, DM sister with anxiety mother with breast cancer  Social History: Reviewed history from 08/19/2009 and no changes required. 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  Alcohol Use - no Regular Exercise - no Drug Use - no  Review of Systems       The patient complains of weight gain and depression.  The patient denies anorexia, fever, weight loss, chest pain, syncope, dyspnea on exertion, peripheral edema, prolonged cough, headaches, hemoptysis, abdominal pain, suspicious skin lesions, unusual weight change, abnormal bleeding, and enlarged lymph nodes.   Psych:  Complains of anxiety, depression, easily angered, and irritability; denies alternate hallucination ( auditory/visual), easily tearful, mental problems, panic attacks, sense of great danger, suicidal thoughts/plans, thoughts of violence, unusual visions or sounds, and thoughts /plans of harming others.  Physical Exam  General:  alert, well-developed, well-nourished, well-hydrated, appropriate dress, normal appearance, healthy-appearing, and cooperative to examination.  overweight-appearing.   Head:  normocephalic, atraumatic, no abnormalities observed, and no abnormalities palpated.   Mouth:  good dentition, no gingival abnormalities, no dental plaque, and pharynx pink and moist.   Neck:  No deformities, masses, or tenderness noted. Lungs:  normal respiratory effort, no intercostal retractions, no accessory muscle use, normal breath sounds, no dullness, no fremitus, no crackles, and no wheezes.   Heart:  normal rate, regular rhythm, no murmur, no gallop, no rub, and no JVD.   Abdomen:  soft, non-tender, normal bowel  sounds, no distention, no masses, no guarding, no rigidity, no rebound tenderness, no abdominal hernia, no inguinal hernia, no hepatomegaly, and no splenomegaly.   Msk:  No deformity or scoliosis noted of thoracic or lumbar spine.   Pulses:  R and L carotid,radial,femoral,dorsalis pedis and posterior tibial pulses are full and equal bilaterally Extremities:  No clubbing, cyanosis, edema, or deformity noted with normal full range of motion of all joints.   Neurologic:  No cranial nerve deficits noted. Station and gait are normal. Plantar reflexes are down-going bilaterally. DTRs are symmetrical throughout. Sensory, motor and coordinative functions appear intact. Skin:  Intact without suspicious lesions or rashes Cervical Nodes:  no anterior cervical adenopathy and no posterior cervical adenopathy.   Psych:  Oriented X3, memory intact for recent and remote,  normally interactive, good eye contact, not anxious appearing, not depressed appearing, not agitated, not suicidal, and not homicidal.     Impression & Recommendations:  Problem # 1:  DEPRESSIVE DISORDER (ICD-311) Assessment Unchanged  Her updated medication list for this problem includes:    Hydroxyzine Hcl 10 Mg Tabs (Hydroxyzine hcl) .Marland Kitchen... 1-2 by mouth q6 hours as needed for hives    Trazodone Hcl 150 Mg Tabs (Trazodone hcl) ..... One by mouth at bedtime    Sertraline Hcl 50 Mg Tabs (Sertraline hcl) ..... One by mouth once daily for depression  Discussed treatment options, including trial of antidpressant medication. Will refer to behavioral health. Follow-up call in in 24-48 hours and recheck in 2 weeks, sooner as needed. Patient agrees to call if any worsening of symptoms or thoughts of doing harm arise. Verified that the patient has no suicidal ideation at this time.   Problem # 2:  PULMONARY HYPERTENSION (ICD-416.8) Assessment: Unchanged  Complete Medication List: 1)  Lasix 20 Mg Tabs (Furosemide) .Marland Kitchen.. 1 tab once daily 2)  Ecotrin  325 Mg Tbec (Aspirin) .Marland Kitchen.. 1p o once daily 3)  Iron 325 (65 Fe) Mg Tabs (Ferrous sulfate) .Marland Kitchen.. 1 tab once daily 4)  Hydroxyzine Hcl 10 Mg Tabs (Hydroxyzine hcl) .Marland Kitchen.. 1-2 by mouth q6 hours as needed for hives 5)  Trazodone Hcl 150 Mg Tabs (Trazodone hcl) .... One by mouth at bedtime 6)  Sertraline Hcl 50 Mg Tabs (Sertraline hcl) .... One by mouth once daily for depression  Patient Instructions: 1)  Please schedule a follow-up appointment in 2 months. 2)  It is important that you exercise regularly at least 20 minutes 5 times a week. If you develop chest pain, have severe difficulty breathing, or feel very tired , stop exercising immediately and seek medical attention. 3)  You need to lose weight. Consider a lower calorie diet and regular exercise.  Prescriptions: SERTRALINE HCL 50 MG TABS (SERTRALINE HCL) One by mouth once daily for depression  #30 x 11   Entered and Authorized by:   [name] MD   Signed by:   [name] MD on 06/15/2010   Method used:   Electronically to        [facility] [address] [phone]* (retail)       [address]       [address]       [place], [place]  [address]       Ph: [phone]       Fax: [phone]   RxID:   [id number]    Orders Added: 1)  Est. Patient Level IV [60630]

## 2010-08-18 NOTE — Progress Notes (Signed)
Summary: RESULTS  Phone Note Call from Patient Call back at [phone]   Summary of Call: Patient is requesting results of labs.  Initial call taken by: Lamar Sprinkles, CMA,  April 07, 2010 10:00 AM  Follow-up for Phone Call        vitamin D level is low, mild anemia, cardiac enzymes and other labs are all normal Follow-up by: [name] MD,  April 07, 2010 10:04 AM  Additional Follow-up for Phone Call Additional follow up Details #1::        Left detailed vm on pt's cell # Additional Follow-up by: Lamar Sprinkles, CMA,  April 07, 2010 12:16 PM

## 2010-08-18 NOTE — Progress Notes (Signed)
Summary: Anxiety  Phone Note Call from Patient   Summary of Call: Pt stopped the Viibrid and says tremors stopped. She is c/o increased anxiety attacks and is going to schedule for earlier apt for f/u. ANything pt can use while waiting on office visit?  Initial call taken by: Lamar Sprinkles, CMA,  June 10, 2010 12:49 PM  Follow-up for Phone Call        higher dose of trazodone Follow-up by: [name] MD,  June 10, 2010 12:57 PM  Additional Follow-up for Phone Call Additional follow up Details #1::        left message for pt to callback office Additional Follow-up by: Brenton Grills CMA Duncan Dull),  June 10, 2010 2:55 PM    Additional Follow-up for Phone Call Additional follow up Details #2::    Pt informed  Follow-up by: Lamar Sprinkles, CMA,  June 10, 2010 5:09 PM  New/Updated Medications: TRAZODONE HCL 150 MG TABS (TRAZODONE HCL) One by mouth at bedtime Prescriptions: TRAZODONE HCL 150 MG TABS (TRAZODONE HCL) One by mouth at bedtime  #30 x 11   Entered and Authorized by:   [name] MD   Signed by:   [name] MD on 06/10/2010   Method used:   Electronically to        [facility] [address] [phone]* (retail)       [address]       [address]       [place], [place]  [address]       Ph: [phone]       Fax: [phone]   RxID:   [id number]

## 2010-08-18 NOTE — Letter (Signed)
Summary: [employer]   Imported By: Lester Westfir 04/29/2010 09:38:58  _____________________________________________________________________  External Attachment:    Type:   Image     Comment:   External Document

## 2010-08-18 NOTE — Assessment & Plan Note (Signed)
Summary: SHAKING-HEART RACING-ANXIOUS-CRYING--STC   Vital Signs:  Patient profile:   45 year old female Height:      67 inches Weight:      285 pounds O2 Sat:      98 % on Room air Temp:     98.7 degrees F oral Pulse rate:   80 / minute Pulse rhythm:   regular Resp:     16 per minute BP sitting:   120 / 68  (left arm)  O2 Flow:  Room air  Primary Care Provider:  [name] MD  CC:  Depressive symptoms.  History of Present Illness:       This is a 45 year old female who presents with Chest pain.  The symptoms began 2 days ago.  On a scale of 1 to 10, the intensity is described as a 3.  The patient reports resting chest pain, but denies exertional chest pain, nausea, vomiting, diaphoresis, shortness of breath, palpitations, dizziness, light headedness, syncope, and indigestion.  The pain is described as constant, sharp, and stabbing.  The pain is located in the left anterior chest.  The pain radiates to the left upper chest.  Episodes of chest pain last >30 minutes.  The pain is brought on or made worse by upper body movement.  The pain is relieved or improved with change in position.    Depressive Symptoms      The patient also presents with Depressive symptoms.  The symptoms began 4-8 weeks ago.  The severity is described as moderate.  The patient reports depressed mood, loss of interest/pleasure, significant weight gain, and insomnia, but denies significant weight loss, hypersomnia, psychomotor agitation, and psychomotor retardation.  The patient also reports fatigue or loss of energy, feelings of worthlessness, and diminished concentration.  The patient denies indecisiveness, thoughts of death, thoughts of suicide, suicidal intent, and suicidal plans.  The patient reports the following psychosocial stressors: recent divorce and major life changes.  The patient denies abnormally elevated mood, abnormally irritable mood, decreased need for sleep, increased talkativeness,  distractibility, flight of ideas, increased goal-directed activity, and inflated self-esteem/ grandiosity.    Preventive Screening-Counseling & Management  Alcohol-Tobacco     Alcohol drinks/day: 0     Smoking Status: never     Tobacco Counseling: not indicated; no tobacco use  Medications Prior to Update: 1)  Lasix 20 Mg Tabs (Furosemide) .Marland Kitchen.. 1 Tab Once Daily 2)  Ecotrin 325 Mg Tbec (Aspirin) .Marland Kitchen.. 1p O Once Daily 3)  Iron 325 (65 Fe) Mg Tabs (Ferrous Sulfate) .Marland Kitchen.. 1 Tab Once Daily 4)  Bromax 11 Mg Xr12h-Tab (Brompheniramine Maleate) .... One By Mouth Two Times A Day As Needed For Itching 5)  Desoximetasone 0.25 % Crea (Desoximetasone) .... Applt To Aa Two Times A Day Prn  Current Medications (verified): 1)  Lasix 20 Mg Tabs (Furosemide) .Marland Kitchen.. 1 Tab Once Daily 2)  Ecotrin 325 Mg Tbec (Aspirin) .Marland Kitchen.. 1p O Once Daily 3)  Iron 325 (65 Fe) Mg Tabs (Ferrous Sulfate) .Marland Kitchen.. 1 Tab Once Daily 4)  Bromax 11 Mg Xr12h-Tab (Brompheniramine Maleate) .... One By Mouth Two Times A Day As Needed For Itching 5)  Desoximetasone 0.25 % Crea (Desoximetasone) .... Applt To Aa Two Times A Day Prn 6)  Viibryd 10 Mg Tabs (Vilazodone Hcl) .... 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  Alcohol Use - no Regular Exercise - no Drug Use - no  Risk Factors: Alcohol Use: 0 (04/06/2010) Exercise: no  (08/12/2008)  Risk Factors: Smoking Status: never (04/06/2010)  Family History: Reviewed history from 08/19/2009 and no changes required. Family History of Coronary Artery Disease: mother and father mother and father with HTN, DM sister with anxiety mother with breast cancer  Social History: Reviewed history from 08/19/2009 and no changes required. 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  Alcohol Use - no Regular Exercise - no Drug Use - no  Review of Systems       The patient complains of weight gain and chest pain.  The patient denies anorexia, fever, weight loss, hoarseness, syncope, dyspnea on exertion, peripheral edema, prolonged cough, headaches, hemoptysis, abdominal pain, hematuria, suspicious skin lesions, enlarged lymph nodes, and angioedema.   Resp:  Complains of cough; denies chest discomfort, chest pain with inspiration, coughing up blood, pleuritic, shortness of breath, sputum productive, and wheezing. Psych:  Complains of anxiety, easily angered, easily tearful, and irritability; denies alternate hallucination ( auditory/visual), depression, mental problems, panic attacks, sense of great danger, suicidal thoughts/plans, thoughts of violence, unusual visions or sounds, and thoughts /plans of harming others. Heme:  Denies abnormal bruising, bleeding, enlarge lymph nodes, fevers, pallor, and skin discoloration.  Physical Exam  General:  alert, well-developed, well-nourished, well-hydrated, appropriate dress, normal appearance, healthy-appearing, and cooperative to examination.   Head:  normocephalic, atraumatic, no abnormalities observed, and no abnormalities palpated.   Mouth:  good dentition, no gingival abnormalities, no dental plaque, and pharynx pink and moist.   Neck:  No deformities, masses, or tenderness noted. Chest Wall:  no deformities, no tenderness, and no mass.   Lungs:  normal respiratory effort, no intercostal retractions, no  accessory muscle use, normal breath sounds, no dullness, no fremitus, no crackles, and no wheezes.   Heart:  normal rate, regular rhythm, no murmur, no gallop, no rub, and no JVD.   Abdomen:  soft, non-tender, normal bowel sounds, no distention, no masses, no guarding, no rigidity, no rebound tenderness, no abdominal hernia, no inguinal hernia, no hepatomegaly, and no splenomegaly.   Msk:  No deformity or scoliosis noted of thoracic or lumbar spine.   Pulses:  R and L carotid,radial,femoral,dorsalis pedis and posterior tibial pulses are full and equal bilaterally Extremities:  No clubbing, cyanosis, edema, or deformity noted with normal full range of motion of all joints.   Neurologic:  No cranial nerve deficits noted. Station and gait are normal. Plantar reflexes are down-going bilaterally. DTRs are symmetrical throughout. Sensory, motor and coordinative functions appear intact. Skin:  Intact without suspicious lesions or rashes Cervical Nodes:  no anterior cervical adenopathy and no posterior cervical adenopathy.   Axillary Nodes:  no R axillary adenopathy and no L axillary adenopathy.   Inguinal Nodes:  no R inguinal adenopathy and no L inguinal adenopathy.   Psych:  Cognition and judgment appear intact. Alert and cooperative with normal attention span and concentration. No apparent delusions, illusions, hallucinations Additional Exam:  EKG shows flat T waves diffusely with  no ST elevation and no Q waves   Impression & Recommendations:  Problem # 1:  DEPRESSIVE DISORDER (ICD-311) Assessment New  Her updated medication list for this problem includes:    Viibryd 10 Mg Tabs (Vilazodone hcl) ..... Starter kit, one once daily  Problem # 2:  ELECTROCARDIOGRAM, ABNORMAL (ICD-794.31) Assessment: New will check cadiac enzymes, d-dimer, lytes, and other labs Orders: Venipuncture (16109) TLB-BMP (Basic Metabolic Panel-BMET) (80048-METABOL) TLB-CBC Platelet - w/Differential  (85025-CBCD) TLB-Hepatic/Liver Function Pnl (80076-HEPATIC) TLB-TSH (Thyroid Stimulating Hormone) (60454-UJW) TLB-Cardiac Panel ([id number]) T-D-Dimer Fibrin Derivatives Quantitive [id number]) T-Vitamin D (25-Hydroxy) (29528-41324) EKG w/ Interpretation (93000)  Problem # 3:  CHEST PAIN UNSPECIFIED (ICD-786.50) Assessment: New this pain does not sound cardiac but since the EKG is abnormal I will check enzymes Orders: Venipuncture (40102) TLB-BMP (Basic Metabolic Panel-BMET) (80048-METABOL) TLB-CBC Platelet - w/Differential (85025-CBCD) TLB-Hepatic/Liver Function Pnl (80076-HEPATIC) TLB-TSH (Thyroid Stimulating Hormone) (84443-TSH) TLB-Cardiac Panel ([id number]) T-D-Dimer Fibrin Derivatives Quantitive ([id number]) T-Vitamin D (25-Hydroxy) (33295-18841) EKG w/ Interpretation (93000)  Complete Medication List: 1)  Lasix 20 Mg Tabs (Furosemide) .Marland Kitchen.. 1 tab once daily 2)  Ecotrin 325 Mg Tbec (Aspirin) .Marland Kitchen.. 1p o once daily 3)  Iron 325 (65 Fe) Mg Tabs (Ferrous sulfate) .Marland Kitchen.. 1 tab once daily 4)  Bromax 11 Mg Xr12h-tab (Brompheniramine maleate) .... One by mouth two times a day as needed for itching 5)  Desoximetasone 0.25 % Crea (Desoximetasone) .... Applt to aa two times a day prn 6)  Viibryd 10 Mg Tabs (Vilazodone hcl) .... Starter kit, one once daily  Patient Instructions: 1)  Please schedule a follow-up appointment in 1 month. 2)  It is important that you exercise regularly at least 20 minutes 5 times a week. If you develop chest pain, have severe difficulty breathing, or feel very tired , stop exercising immediately and seek medical attention. 3)  You need to lose weight. Consider a lower calorie diet and regular exercise.  Prescriptions: VIIBRYD 10 MG TABS (VILAZODONE HCL) starter kit, one once daily  #1 x 0   Entered and Authorized by:   [name] MD   Signed by:   [name] MD on 04/06/2010   Method used:   Samples Given   RxID:   [id number]

## 2010-08-18 NOTE — Progress Notes (Signed)
Summary: med change  Phone Note From Pharmacy   Summary of Call: [facility] sent fax stating that triamcinolon 0.5% cream is on back order and request for MD to changes to something else. Please advise    New/Updated Medications: DESOXIMETASONE 0.25 % CREA (DESOXIMETASONE) Applt to AA two times a day prn Prescriptions: DESOXIMETASONE 0.25 % CREA (DESOXIMETASONE) Applt to AA two times a day prn  #30 gms x 1   Entered and Authorized by:   [name] MD   Signed by:   [name] MD on 01/12/2010   Method used:   Electronically to        [facility] [phone]* (retail)       [address]       [place], [place]  [address]       Ph: [phone]       Fax: [phone]   RxID:   [id number]

## 2010-08-18 NOTE — Letter (Signed)
Summary: Out of Work  [facility]  [address] [place], [place] [address]   Phone: [phone]  Fax: [phone]    August 27, 2009   Employee:  Jessica Davenport    To Whom It May Concern:   For Medical reasons, please excuse the above named employee from work for the following dates:  Start:   Aug 28, 2009  End:   Sep 01, 2009  If you need additional information, please feel free to contact our office.         Sincerely,    Corwin Levins MD

## 2010-08-18 NOTE — Progress Notes (Signed)
Summary: Hives  Phone Note Call from Patient   Summary of Call: Patient is requesting rx for "hives". She is currently taking vicodin due to oral surgery which is causing itching. Says she can not stop med b/c of pain. She can not take benadryl during the day b/c of sleepyness. Please advise. Initial call taken by: Lamar Sprinkles, CMA,  April 21, 2010 9:24 AM  Follow-up for Phone Call        Patient notified by request different pharmacy  Follow-up by: Rock Nephew CMA,  April 21, 2010 2:05 PM    New/Updated Medications: HYDROXYZINE HCL 10 MG TABS (HYDROXYZINE HCL) 1-2 by mouth q6 hours as needed for hives Prescriptions: HYDROXYZINE HCL 10 MG TABS (HYDROXYZINE HCL) 1-2 by mouth q6 hours as needed for hives  #50 x 1   Entered by:   Rock Nephew CMA   Authorized by:   [name] MD   Signed by:   Rock Nephew CMA on 04/21/2010   Method used:   Electronically to        [facility] [address] [phone]* (retail)       [address]       [address]       [place], [place]  [address]       Ph: [phone]       Fax: [phone]   RxID:   [id number] HYDROXYZINE HCL 10 MG TABS (HYDROXYZINE HCL) 1-2 by mouth q6 hours as needed for hives  #50 x 1   Entered and Authorized by:   [name] MD   Signed by:   [name] MD on 04/21/2010   Method used:   Electronically to        [facility] [phone]* (retail)       [address]       [place], [place]  [address]       Ph: [phone]       Fax: [phone]   RxID:   [id number]

## 2010-08-18 NOTE — Progress Notes (Signed)
Summary: Lodrane  Phone Note From Pharmacy   Caller: [facility] [address] [phone]* Summary of Call: Pharmacy called requesting permission to dispense generic for Lodrane stating that it is generic but is not 100% approved. Please advise. Initial call taken by: Lucious Groves,  January 08, 2010 4:21 PM  Follow-up for Phone Call        Thanks. Follow-up by: Lucious Groves,  January 08, 2010 5:10 PM    New/Updated Medications: BROMAX 11 MG XR12H-TAB (BROMPHENIRAMINE MALEATE) One by mouth two times a day as needed for itching Prescriptions: BROMAX 11 MG XR12H-TAB (BROMPHENIRAMINE MALEATE) One by mouth two times a day as needed for itching  #60 x 2   Entered and Authorized by:   [name] MD   Signed by:   [name] MD on 01/08/2010   Method used:   Electronically to        [facility] [phone]* (retail)       [address]       [place], [place]  [address]       Ph: [phone]       Fax: [phone]   RxID:   [id number]

## 2010-08-18 NOTE — Progress Notes (Signed)
Summary: FORMS  Phone Note Call from Patient Call back at [phone]   Summary of Call: Patient is requesting a call back.  Initial call taken by: Lamar Sprinkles, CMA,  April 08, 2010 4:06 PM  Follow-up for Phone Call        Pt continues to have extreme anxiety and stress. She will get FMLA forms from employer and schedule office visit with Dr Yetta Barre to discuss some time off work.  Follow-up by: Lamar Sprinkles, CMA,  April 08, 2010 4:35 PM

## 2010-08-18 NOTE — Progress Notes (Signed)
  Phone Note Call from Patient   Caller: Patient Summary of Call: Per pt, called stating that she need rx [facility] of [facility]. RX sent and pt notified Initial call taken by: Rock Nephew CMA,  January 09, 2010 11:26 AM    Prescriptions: TRIAMCINOLONE ACETONIDE 0.5 % CREA (TRIAMCINOLONE ACETONIDE) Apply to AA two times a day as needed for rash/itching  #45 gms x 2   Entered by:   Rock Nephew CMA   Authorized by:   [name] MD   Signed by:   Rock Nephew CMA on 01/09/2010   Method used:   Electronically to        [facility] [phone]* (retail)       [address]       [address]       [place], [place]  [address]       Ph: [phone]       Fax: [phone]   RxID:   [id number] BROMAX 11 MG XR12H-TAB (BROMPHENIRAMINE MALEATE) One by mouth two times a day as needed for itching  #60 x 2   Entered by:   Rock Nephew CMA   Authorized by:   [name] MD   Signed by:   Rock Nephew CMA on 01/09/2010   Method used:   Electronically to        [facility] [phone]* (retail)       [address]       [address]       [place], [place]  [address]       Ph: [phone]       Fax: [phone]   RxID:   [id number]

## 2010-08-18 NOTE — Progress Notes (Signed)
Summary: Viibryd  Phone Note Call from Patient   Summary of Call: Patient is requesting a call back regarding vibrid med.  Initial call taken by: Lamar Sprinkles, CMA,  June 04, 2010 9:43 AM  Follow-up for Phone Call        Pt c/o shaking all over, nervous feeling, swelling in legs, ankles & feet, wt gain and she is waking up at 3 am unable to go back to sleep. Symptoms started approx 3 wks ago.  Follow-up by: Lamar Sprinkles, CMA,  June 04, 2010 9:46 AM  Additional Follow-up for Phone Call Additional follow up Details #1::        if she thinks this is a viibryd side effect then stop viibryd and follow-up with me in 2 weeks Additional Follow-up by: [name] MD,  June 04, 2010 9:48 AM    Additional Follow-up for Phone Call Additional follow up Details #2::    Pt informed  Follow-up by: Lamar Sprinkles, CMA,  June 04, 2010 11:01 AM

## 2010-08-18 NOTE — Assessment & Plan Note (Signed)
Summary: RASH IS WORSE  STC   Vital Signs:  Patient profile:   45 year old female Height:      66.5 inches Weight:      254 pounds BMI:     40.53 O2 Sat:      98 % on Room air Temp:     98.2 degrees F oral Pulse rate:   77 / minute BP sitting:   112 / 70  (left arm) Cuff size:   large  Vitals Entered ByZella Ball Ewing (August 26, 2009 11:24 AM)  O2 Flow:  Room air CC: rash/RE   CC:  rash/RE.  History of Present Illness: here with improvement of most recent rash with resolving vesciles to the left breast area with only an area of plaquelike erythema now wiht mild tender, but now with other rash worse to bilat upper abd, chest, upper back and medial left arm with some itch but mostly painful;  denies fever, chills, weakness, malaise, wt loss, night sweats, and Pt denies CP, sob, doe, wheezing, orthopnea, pnd, worsening LE edema, palps, dizziness or syncope   Pt denies new neuro symptoms such as headache, facial or extremity weakness   O/w seems to tolerate currnet meds well    Problems Prior to Update: 1)  Rash-nonvesicular  (ICD-782.1) 2)  Disc Disease, Lumbar  (ICD-722.52) 3)  Pulmonary Hypertension  (ICD-416.8) 4)  Allergic Rhinitis  (ICD-477.9) 5)  Fever Unspecified  (ICD-780.60) 6)  Uri  (ICD-465.9) 7)  Shingles  (ICD-053.9) 8)  Vitamin D Deficiency  (ICD-268.9) 9)  Anemia-iron Deficiency  (ICD-280.9)  Medications Prior to Update: 1)  Lasix 20 Mg Tabs (Furosemide) .Marland Kitchen.. 1 Tab Once Daily 2)  Ecotrin 325 Mg Tbec (Aspirin) .Marland Kitchen.. 1p O Once Daily 3)  Valacyclovir Hcl 1 Gm Tabs (Valacyclovir Hcl) .Marland Kitchen.. 1 Gm By Mouth Three Times A Day For 7 Days 4)  Caladryl 1-8 % Lotn (Pramoxine-Calamine) .... Use Asd Four Times Per Day As Needed  Current Medications (verified): 1)  Lasix 20 Mg Tabs (Furosemide) .Marland Kitchen.. 1 Tab Once Daily 2)  Ecotrin 325 Mg Tbec (Aspirin) .Marland Kitchen.. 1p O Once Daily 3)  Valacyclovir Hcl 1 Gm Tabs (Valacyclovir Hcl) .Marland Kitchen.. 1 Gm By Mouth Three Times A Day For 7 Days 4)   Caladryl 1-8 % Lotn (Pramoxine-Calamine) .... Use Asd Four Times Per Day As Needed 5)  Doxycycline Hyclate 100 Mg Caps (Doxycycline Hyclate) .Marland Kitchen.. 1po Two Times A Day 6)  Hydroxyzine Hcl 25 Mg Tabs (Hydroxyzine Hcl) .Marland Kitchen.. 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  Alcohol Use - no Regular Exercise - no Drug Use - no  Risk Factors: Exercise: no (08/12/2008)  Risk Factors: Smoking Status: never (08/12/2008)  Review of Systems       all otherwise negative per pt   Physical Exam  General:  alert and overweight-appearing.   Head:  normocephalic and atraumatic.   Eyes:  vision grossly intact, pupils equal, and pupils round.   Ears:  R ear normal and L ear normal.   Nose:  no external deformity and no nasal discharge.  Mouth:  no gingival abnormalities and pharynx pink and moist.   Neck:  supple and no masses.   Lungs:  normal respiratory effort and normal breath sounds.   Heart:  normal rate and regular rhythm.   Abdomen:  soft, non-tender, and normal bowel sounds.   Extremities:  no edema, no erythema  Skin:  grouped vesicle type rash to left breast improved with resolution of vesicles and plaque;like  erythema only still presnet;  also has numberous single erythem lesions ot the upper abd, chest , upper back adn left medial arm small 5 mm and mild tender   Impression & Recommendations:  Problem # 1:  RASH-NONVESICULAR (ICD-782.1) to upper abd, chest and back and medial left arm  ? staph  - for doxy trial  Problem # 2:  SHINGLES  (ICD-053.9) improved, to finish prior tx  Complete Medication List: 1)  Lasix 20 Mg Tabs (Furosemide) .Marland Kitchen.. 1 tab once daily 2)  Ecotrin 325 Mg Tbec (Aspirin) .Marland Kitchen.. 1p o once daily 3)  Valacyclovir Hcl 1 Gm Tabs (Valacyclovir hcl) .Marland Kitchen.. 1 gm by mouth three times a day for 7 days 4)  Caladryl 1-8 % Lotn (Pramoxine-calamine) .... Use asd four times per day as needed 5)  Doxycycline Hyclate 100 Mg Caps (Doxycycline hyclate) .Marland Kitchen.. 1po two times a day 6)  Hydroxyzine Hcl 25 Mg Tabs (Hydroxyzine hcl) .Marland Kitchen.. 1 - 2 by mouth four times per day as needed for itch  Patient Instructions: 1)  Please take all new medications as prescribed 2)  Continue all previous medications as before this visit  3)  please call in 2 to 3 days if not improved for dermatology referral 4)  Please schedule a follow-up appointment as needed. Prescriptions: HYDROXYZINE HCL 25 MG TABS (HYDROXYZINE HCL) 1 - 2 by mouth four times per day as needed for itch  #50 x 1   Entered and Authorized by:   Corwin Levins MD   Signed by:   Corwin Levins MD on 08/26/2009   Method used:   Print then Give to Patient   RxID:   [id number] DOXYCYCLINE HYCLATE 100 MG CAPS (DOXYCYCLINE HYCLATE) 1po two times a day  #20 x 0   Entered and Authorized by:   Corwin Levins MD   Signed by:   Corwin Levins MD on 08/26/2009   Method used:   Print then Give to Patient   RxID:   [id number]

## 2010-08-18 NOTE — Miscellaneous (Signed)
Summary: Orders Update  Clinical Lists Changes  Orders: Added new Referral order of Dermatology Referral (Derma) - Signed 

## 2010-08-18 NOTE — Letter (Signed)
Summary: Out of Work  [facility]  [address] [place], [place] [address]   Phone: [phone]  Fax: [phone]    October 23, 2009   Employee:  Jessica Davenport Bolinger    To Whom It May Concern:   For Medical reasons, please excuse the above named employee from work for the following dates:  Start:   10/23/09  End:   10/27/09  If you need additional information, please feel free to contact our office.         Sincerely,    [name] MD

## 2010-08-18 NOTE — Assessment & Plan Note (Signed)
Summary: SORE THROAT/ NOSE BLEEDS/ RED EYES/ NWS   Vital Signs:  Patient profile:   45 year old female Height:      66.5 inches (168.91 cm) Weight:      261.75 pounds (118.98 kg) BMI:     41.77 O2 Sat:      98 % on Room air Temp:     98.1 degrees F (36.72 degrees C) oral Pulse rate:   80 / minute Pulse rhythm:   regular Resp:     16 per minute BP sitting:   122 / 76  (left arm) Cuff size:   large  Vitals Entered By: Morrie Sheldon Johnson(October 23, 2009 1:20 PM)  Nutrition Counseling: Patient's BMI is greater than 25 and therefore counseled on weight management options.  O2 Flow:  Room air CC: pt c/o sore throat/swelling in throat blocking airway since sunday/pt also stated she had a nosebleed today/no voice/fatigue/ eye redness/aj, URI symptoms   Primary Care Provider:  Corwin Levins MD  CC:  pt c/o sore throat/swelling in throat blocking airway since sunday/pt also stated she had a nosebleed today/no voice/fatigue/ eye redness/aj and URI symptoms.  History of Present Illness:  URI Symptoms      This is a 45 year old woman who presents with URI symptoms.  The symptoms began 6 days ago.  The severity is described as moderate.  The patient reports nasal congestion, purulent nasal discharge, and dry cough, but denies productive cough, earache, and sick contacts.  The patient denies fever, stiff neck, dyspnea, wheezing, rash, vomiting, diarrhea, use of an antipyretic, and response to antipyretic.  The patient denies headache, muscle aches, and severe fatigue.  The patient denies the following risk factors for Strep sinusitis: unilateral facial pain, unilateral nasal discharge, poor response to decongestant, double sickening, tooth pain, Strep exposure, tender adenopathy, and absence of cough.  She went to an [facility] 3 days ago and she says that blood work and a RSS were normal but she says that a chest xray was not done. The [facility] gave several prescriptions but she still has a severe cough with  post-tussive N/V.  Preventive Screening-Counseling & Management  Alcohol-Tobacco     Alcohol drinks/day: 0     Smoking Status: never  Hep-HIV-STD-Contraception     Hepatitis Risk: no risk noted     HIV Risk: no risk noted     STD Risk: no risk noted  Medications Prior to Update: 1)  Lasix 20 Mg Tabs (Furosemide) .Marland Kitchen.. 1 Tab Once Daily 2)  Ecotrin 325 Mg Tbec (Aspirin) .Marland Kitchen.. 1p O Once Daily 3)  Valacyclovir Hcl 1 Gm Tabs (Valacyclovir Hcl) .Marland Kitchen.. 1 Gm By Mouth Three Times A Day For 7 Days 4)  Caladryl 1-8 % Lotn (Pramoxine-Calamine) .... Use Asd Four Times Per Day As Needed 5)  Doxycycline Hyclate 100 Mg Caps (Doxycycline Hyclate) .Marland Kitchen.. 1po Two Times A Day 6)  Hydroxyzine Hcl 25 Mg Tabs (Hydroxyzine Hcl) .Marland Kitchen.. 1 - 2 By Mouth Four Times Per Day As Needed For Itch  Current Medications (verified): 1)  Lasix 20 Mg Tabs (Furosemide) .Marland Kitchen.. 1 Tab Once Daily 2)  Ecotrin 325 Mg Tbec (Aspirin) .Marland Kitchen.. 1p O Once Daily 3)  Hydroxyzine Hcl 25 Mg Tabs (Hydroxyzine Hcl) .Marland Kitchen.. 1 - 2 By Mouth Four Times Per Day As Needed For Itch 4)  Methylprednisolone (Pak) 4 Mg Tabs (Methylprednisolone) .... Follow Directions 5)  Cefuroxime Axetil 500 Mg Tabs (Cefuroxime Axetil) .Marland Kitchen.. 1 Tab Two Times A Day For 10  Days 6)  Allerx Df 4-2.5 & 8-2.5 Mg Misc (Chlorpheniramine-Methscop) .... Take 1 White Am Tab in Morning Take ` Blue Pm Tab in Evening 7)  Antibiotic Ear 3.5-10000-1 Soln (Neomycin-Polymyxin-Hc) .... 5 Drops in Affected Ear Four Times A Day For 10 Days 8)  Iron 325 (65 Fe) Mg Tabs (Ferrous Sulfate) .Marland Kitchen.. 1 Tab Once Daily 9)  Mytussin Ac 100-10 Mg/38ml Syrp (Guaifenesin-Codeine) .... 5-10 Ml By Mouth Qid As Needed For Cough  Allergies (verified): No Known Drug Allergies  Past History:  Past Medical History: Reviewed history from 08/20/2009 and no changes required. 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 Tubal Ligation 1996  Family History: Reviewed history from 08/19/2009 and no changes required. Family History of Coronary Artery Disease: mother and father mother and father with HTN, DM sister with anxiety mother with breast cancer  Social History: Reviewed history from 08/19/2009 and no changes required. 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  Alcohol Use - no Regular Exercise - no Drug Use - no Hepatitis Risk:  no risk noted HIV Risk:  no risk noted STD Risk:  no risk noted  Review of Systems  The patient denies anorexia, fever, weight loss, chest pain, syncope, dyspnea on exertion, peripheral edema, prolonged cough, headaches, hemoptysis, abdominal pain, hematuria, suspicious skin lesions, and enlarged lymph nodes.    Physical Exam  General:  alert, well-developed, well-nourished, well-hydrated, appropriate dress, normal appearance, and healthy-appearing.   Head:  normocephalic, atraumatic, no abnormalities observed, and no abnormalities palpated.   Eyes:  No corneal or conjunctival inflammation noted. EOMI. Perrla. Funduscopic exam benign, without hemorrhages, exudates or papilledema. Vision grossly normal. Ears:  R ear normal and L ear normal.   Nose:  no external deformity, nose piercing noted, no external erythema, no nasal discharge, no mucosal pallor, no mucosal edema, no airflow obstruction, no intranasal foreign body, no nasal polyps, no nasal mucosal lesions, no mucosal friability, no active bleeding or clots, no sinus percussion tenderness, and no septum abnormalities.   Mouth:  Oral mucosa and oropharynx without lesions or exudates.  Teeth in good repair. Neck:  No deformities, masses, or tenderness noted. Lungs:  Normal respiratory effort, chest expands symmetrically. Lungs are clear to auscultation, no crackles or wheezes. Heart:  Normal rate and regular rhythm.  S1 and S2 normal without gallop, murmur, click, rub or other extra sounds. Abdomen:  Bowel sounds positive,abdomen soft and non-tender without masses, organomegaly or hernias noted. Msk:  No deformity or scoliosis noted of thoracic or lumbar spine.   Pulses:  R and L carotid,radial,femoral,dorsalis pedis and posterior tibial pulses are full and equal bilaterally Extremities:  No clubbing, cyanosis, edema, or deformity noted with normal full range of motion of all joints.   Neurologic:  No cranial nerve deficits noted. Station and gait are normal. Plantar reflexes are down-going bilaterally. DTRs are symmetrical throughout. Sensory, motor and coordinative functions appear intact. Skin:  Intact without suspicious lesions or rashes Cervical Nodes:  no anterior cervical adenopathy and no posterior cervical adenopathy.   Axillary Nodes:  no R axillary adenopathy and no L axillary adenopathy.   Inguinal Nodes:  no R inguinal adenopathy and no L inguinal adenopathy.   Psych:  Cognition and judgment appear intact. Alert and cooperative with normal attention span and concentration. No apparent delusions, illusions, hallucinations  Impression & Recommendations:  Problem # 1:  COUGH (ICD-786.2) will look for pneumonia Orders: T-2 View CXR (71020TC)  Problem # 2:  URI (ICD-465.9) Assessment: Unchanged continue current meds but offer stronger Rx for cough  Her updated medication list for this problem includes:    Ecotrin 325 Mg Tbec (Aspirin) .Marland Kitchen... 1p o once daily    Allerx Df 4-2.5 & 8-2.5 Mg Misc (Chlorpheniramine-methscop) .Marland Kitchen... Take 1 white am tab in morning take ` blue pm tab in evening    Mytussin Ac 100-10 Mg/65ml Syrp (Guaifenesin-codeine) .Marland Kitchen... 5-10 ml by mouth qid as needed for cough  Instructed on symptomatic treatment. Call if symptoms persist or worsen.   Complete Medication List: 1)  Lasix 20 Mg Tabs (Furosemide) .Marland Kitchen.. 1 tab once daily 2)  Ecotrin 325 Mg Tbec (Aspirin) .Marland Kitchen.. 1p o once  daily 3)  Hydroxyzine Hcl 25 Mg Tabs (Hydroxyzine hcl) .Marland Kitchen.. 1 - 2 by mouth four times per day as needed for itch 4)  Methylprednisolone (pak) 4 Mg Tabs (Methylprednisolone) .... Follow directions 5)  Cefuroxime Axetil 500 Mg Tabs (Cefuroxime axetil) .Marland Kitchen.. 1 tab two times a day for 10 days 6)  Allerx Df 4-2.5 & 8-2.5 Mg Misc (Chlorpheniramine-methscop) .... Take 1 white am tab in morning take ` blue pm tab in evening 7)  Antibiotic Ear 3.5-10000-1 Soln (Neomycin-polymyxin-hc) .... 5 drops in affected ear four times a day for 10 days 8)  Iron 325 (65 Fe) Mg Tabs (Ferrous sulfate) .Marland Kitchen.. 1 tab once daily 9)  Mytussin Ac 100-10 Mg/24ml Syrp (Guaifenesin-codeine) .... 5-10 ml by mouth qid as needed for cough  Patient Instructions: 1)  Please schedule a follow-up appointment in 2 weeks. 2)  Take your antibiotic as prescribed until ALL of it is gone, but stop if you develop a rash or swelling and contact our office as soon as possible. 3)  Acute bronchitis symptoms for less than 10 days are not helped by antibiotics. take over the counter cough medications. call if no improvment in  5-7 days, sooner if increasing cough, fever, or new symptoms( shortness of breath, chest pain). Prescriptions: MYTUSSIN AC 100-10 MG/5ML SYRP (GUAIFENESIN-CODEINE) 5-10 ml by mouth QID as needed for cough  #8 ozs. x 0   Entered and Authorized by:   [name] MD   Signed by:   [name] MD on 10/23/2009   Method used:   Print then Give to Patient   RxID:   [id number]

## 2010-08-18 NOTE — Miscellaneous (Signed)
  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

## 2010-08-19 ENCOUNTER — Encounter: Payer: Self-pay | Admitting: Internal Medicine

## 2010-08-20 NOTE — Progress Notes (Signed)
Summary: REQ A LETTER  Phone Note Call from Patient   Summary of Call: Please see previous phone note:   Pt need letter from MD stating she continues to be under his care and will be re-evaluated in January.  Follow-up by: Lamar Sprinkles, CMA,  July 01, 2010 9:22 AM  Initial call taken by: Lamar Sprinkles, CMA,  July 06, 2010 9:11 AM  Follow-up for Phone Call        please print this for me Follow-up by: [name] MD,  July 06, 2010 9:22 AM  Additional Follow-up for Phone Call Additional follow up Details #1::        Left vm for pt that letter is ready.  Additional Follow-up by: Lamar Sprinkles, CMA,  July 06, 2010 5:30 PM

## 2010-08-20 NOTE — Progress Notes (Signed)
Summary: FMLA  Phone Note Call from Patient Call back at Home Phone [phone] Call back at [phone]   Summary of Call: Patient is requesting a call back. Has questions about FMLA dates.  Initial call taken by: Lamar Sprinkles, CMA,  June 29, 2010 4:39 PM  Follow-up for Phone Call        Pt need letter from MD stating she continues to be under his care and will be re-evaluated in January.  Follow-up by: Lamar Sprinkles, CMA,  July 01, 2010 9:22 AM

## 2010-08-20 NOTE — Letter (Signed)
Summary: Generic Letter   [facility]  [address] [place], [place] [address]   Phone: [phone]  Fax: [phone]    07/06/2010  [facility] [address] [place], [place]  [address]    The above named patient is being treated by Dr Sanda Linger. She is required by Dr Yetta Barre to be re-evaluated January 2012. Please contact our office with any questions.      Sincerely,   Lamar Sprinkles, CMA (AAM) for Dr Sanda Linger

## 2010-08-21 ENCOUNTER — Telehealth: Payer: Self-pay | Admitting: Internal Medicine

## 2010-08-21 ENCOUNTER — Encounter: Payer: Self-pay | Admitting: Internal Medicine

## 2010-08-26 NOTE — Letter (Signed)
Summary: Out of Work  [facility]  [address] [place], [place] [address]   Phone: [phone]  Fax: [phone]    August 21, 2010   Employee:  RULA KENISTON Hipple    To Whom It May Concern:   Aubrei Bouchie may return to work Monday Feb. 6th. She is to remain on light duty. If you need additional information, please feel free to contact our office.         Sincerely,    Lamar Sprinkles, CMA for Dr Sanda Linger

## 2010-08-26 NOTE — Assessment & Plan Note (Signed)
Summary: 2 mos f/u #/cd   Vital Signs:  Patient profile:   45 year old female Menstrual status:  irregular LMP:     07/28/2010 Height:      67 inches Weight:      292 pounds BMI:     45.90 O2 Sat:      98 % on Room air Temp:     98.0 degrees F oral Pulse rate:   84 / minute Pulse rhythm:   regular Resp:     16 per minute BP sitting:   114 / 82  (left arm) Cuff size:   large  Vitals Entered By: Rock Nephew CMA (August 18, 2010 10:23 AM)  O2 Flow:  Room air  Primary Care Provider:  [name] MD   History of Present Illness: She returns for f/up and she tells me that she was at a drugstore about 3 weeks ago and her blood sugar was 345.  Also, she tells me that she strained her right knee on 08/06/10 and is seeing ?ortho doctor.    Preventive Screening-Counseling & Management  Alcohol-Tobacco     Alcohol drinks/day: 0     Alcohol Counseling: not indicated; patient does not drink     Smoking Status: never     Tobacco Counseling: not indicated; no tobacco use  Hep-HIV-STD-Contraception     Hepatitis Risk: no risk noted     HIV Risk: no risk noted     STD Risk: no risk noted      Sexual History:  currently monogamous.        Drug Use:  no.    Clinical Review Panels:  Prevention   Last Mammogram:  normal (02/16/2010)   Last Pap Smear:  normal (02/16/2010)  Immunizations   Last Tetanus Booster:  Tdap (08/19/2009)  Diabetes Management   Creatinine:  0.7 (04/06/2010)  CBC   WBC:  7.4 (04/06/2010)   RBC:  3.85 (04/06/2010)   Hgb:  11.1 (04/06/2010)   Hct:  33.1 (04/06/2010)   Platelets:  288.0 (04/06/2010)   MCV  86.0 (04/06/2010)   MCHC  33.7 (04/06/2010)   RDW  14.4 (04/06/2010)   PMN:  63.7 (04/06/2010)   Lymphs:  29.8 (04/06/2010)   Monos:  5.3 (04/06/2010)   Eosinophils:  0.9 (04/06/2010)   Basophil:  0.3 (04/06/2010)  Complete Metabolic Panel   Glucose:  108 (04/06/2010)   Sodium:  138 (04/06/2010)   Potassium:  3.7 (04/06/2010)  Chloride:  99 (04/06/2010)   CO2:  30 (04/06/2010)   BUN:  9 (04/06/2010)   Creatinine:  0.7 (04/06/2010)   Albumin:  3.5 (04/06/2010)   Total Protein:  7.0 (04/06/2010)   Calcium:  8.5 (04/06/2010)   Total Bili:  0.4 (04/06/2010)   Alk Phos:  56 (04/06/2010)   SGPT (ALT):  14 (04/06/2010)   SGOT (AST):  17 (04/06/2010)   Medications Prior to Update: 1)  Lasix 20 Mg Tabs (Furosemide) .Marland Kitchen.. 1 Tab Once Daily 2)  Ecotrin 325 Mg Tbec (Aspirin) .Marland Kitchen.. 1p O Once Daily 3)  Iron 325 (65 Fe) Mg Tabs (Ferrous Sulfate) .Marland Kitchen.. 1 Tab Once Daily 4)  Hydroxyzine Hcl 10 Mg Tabs (Hydroxyzine Hcl) .Marland Kitchen.. 1-2 By Mouth Q6 Hours As Needed For Hives 5)  Trazodone Hcl 150 Mg Tabs (Trazodone Hcl) .... One By Mouth At Bedtime 6)  Sertraline Hcl 50 Mg Tabs (Sertraline Hcl) .... One By Mouth Once Daily For Depression  Current Medications (verified): 1)  Lasix 20 Mg Tabs (Furosemide) .Marland KitchenMarland KitchenMarland Kitchen 1  Tab Once Daily 2)  Ecotrin 325 Mg Tbec (Aspirin) .Marland Kitchen.. 1p O Once Daily 3)  Iron 325 (65 Fe) Mg Tabs (Ferrous Sulfate) .Marland Kitchen.. 1 Tab Once Daily 4)  Hydroxyzine Hcl 10 Mg Tabs (Hydroxyzine Hcl) .Marland Kitchen.. 1-2 By Mouth Q6 Hours As Needed For Hives 5)  Sertraline Hcl 50 Mg Tabs (Sertraline Hcl) .... One By Mouth Once Daily For Depression 6)  Ibuprofen 800 Mg Tabs (Ibuprofen) 7)  Hydrocodone-Acetaminophen 5-500 Mg Tabs (Hydrocodone-Acetaminophen) .... 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  Alcohol Use - no Regular Exercise - no Drug Use - no  Risk Factors: Alcohol Use: 0 (08/18/2010) Exercise: no (08/12/2008)  Risk Factors: Smoking Status: never (08/18/2010)  Family History: Reviewed history from 08/19/2009 and no changes required. Family History of Coronary Artery Disease: mother and father mother and father with HTN, DM sister with anxiety mother with breast cancer  Social History: Reviewed history from 08/19/2009 and no changes required. 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  Alcohol Use - no Regular Exercise - no Drug Use - no  Review of Systems       The patient complains of weight gain and difficulty walking.  The patient denies anorexia, fever, weight loss, chest pain, syncope, dyspnea on exertion, peripheral edema, prolonged cough, headaches, hemoptysis, abdominal pain, hematuria, muscle weakness, suspicious skin lesions, depression, abnormal bleeding, and enlarged lymph nodes.   MS:  Complains of joint pain and stiffness; denies joint redness, joint swelling, loss of strength, low back pain, mid back pain, muscle aches, and muscle weakness. Psych:  Denies anxiety, depression, easily angered, easily tearful, irritability, mental problems, panic attacks, sense of great danger, suicidal thoughts/plans, thoughts of violence, and unusual visions or sounds. Endo:  Complains of excessive thirst; denies cold intolerance, excessive hunger, excessive urination, heat intolerance, polyuria, and weight change. Heme:  Denies abnormal bruising, bleeding, enlarge lymph nodes, fevers, pallor, and skin discoloration.  Physical Exam  General:  alert, well-developed, well-nourished, well-hydrated, appropriate dress, normal appearance, healthy-appearing, and cooperative to examination.  overweight-appearing.   Head:  normocephalic, atraumatic, no abnormalities observed, and no abnormalities palpated.   Mouth:   good dentition, no gingival abnormalities, no dental plaque, and pharynx pink and moist.   Neck:  No deformities, masses, or tenderness noted. Lungs:  normal respiratory effort, no intercostal retractions, no accessory muscle use, normal breath sounds, no dullness, no fremitus, no crackles, and no wheezes.   Heart:  normal rate, regular rhythm, no murmur, no gallop, no rub, and no JVD.   Abdomen:  soft, non-tender, normal bowel sounds, no distention, no masses, no guarding, no rigidity, no rebound tenderness, no abdominal hernia, no inguinal hernia, no hepatomegaly, and no splenomegaly.   Msk:  normal ROM, no joint tenderness, no joint swelling, no joint warmth, no redness over joints, no joint deformities, no joint instability, no crepitation, and no muscle atrophy.   Pulses:  R and L carotid,radial,femoral,dorsalis pedis and posterior tibial  pulses are full and equal bilaterally Extremities:  No clubbing, cyanosis, edema, or deformity noted with normal full range of motion of all joints.   Neurologic:  No cranial nerve deficits noted. Station and gait are normal. Plantar reflexes are down-going bilaterally. DTRs are symmetrical throughout. Sensory, motor and coordinative functions appear intact. Skin:  Intact without suspicious lesions or rashes Psych:  Oriented X3, memory intact for recent and remote, normally interactive, good eye contact, not anxious appearing, not depressed appearing, not agitated, not suicidal, and not homicidal.     Impression & Recommendations:  Problem # 1:  HYPERGLYCEMIA (ICD-790.29) Assessment New  Orders: Venipuncture (16109) TLB-BMP (Basic Metabolic Panel-BMET) (80048-METABOL) TLB-IBC Pnl (Iron/FE;Transferrin) (83550-IBC) TLB-CBC Platelet - w/Differential (85025-CBCD) TLB-A1C / Hgb A1C (Glycohemoglobin) (83036-A1C) T-Vitamin D (25-Hydroxy) [phone])  Labs Reviewed: Creat: 0.7 (04/06/2010)     Problem # 2:  DEPRESSIVE DISORDER (ICD-311) Assessment:  Improved  The following medications were removed from the medication list:    Trazodone Hcl 150 Mg Tabs (Trazodone hcl) ..... One by mouth at bedtime Her updated medication list for this problem includes:    Hydroxyzine Hcl 10 Mg Tabs (Hydroxyzine hcl) .Marland Kitchen... 1-2 by mouth q6 hours as needed for hives    Sertraline Hcl 50 Mg Tabs (Sertraline hcl) ..... One by mouth once daily for depression  Problem # 3:  PULMONARY HYPERTENSION (ICD-416.8) Assessment: Unchanged  Orders: Venipuncture (91478) TLB-BMP (Basic Metabolic Panel-BMET) (80048-METABOL) TLB-IBC Pnl (Iron/FE;Transferrin) (83550-IBC) TLB-CBC Platelet - w/Differential (85025-CBCD) TLB-A1C / Hgb A1C (Glycohemoglobin) (83036-A1C) T-Vitamin D (25-Hydroxy) (29562-13086)  Problem # 4:  ANEMIA-IRON DEFICIENCY (ICD-280.9) Assessment: Unchanged  Her updated medication list for this problem includes:    Iron 325 (65 Fe) Mg Tabs (Ferrous sulfate) .Marland Kitchen... 1 tab once daily  Orders: Venipuncture (57846) TLB-BMP (Basic Metabolic Panel-BMET) (80048-METABOL) TLB-IBC Pnl (Iron/FE;Transferrin) (83550-IBC) TLB-CBC Platelet - w/Differential (85025-CBCD) TLB-A1C / Hgb A1C (Glycohemoglobin) (83036-A1C) T-Vitamin D (25-Hydroxy) (96295-28413)  Problem # 5:  VITAMIN D DEFICIENCY (ICD-268.9) Assessment: Unchanged  Orders: Venipuncture (24401) TLB-BMP (Basic Metabolic Panel-BMET) (80048-METABOL) TLB-IBC Pnl (Iron/FE;Transferrin) (83550-IBC) TLB-CBC Platelet - w/Differential (85025-CBCD) TLB-A1C / Hgb A1C (Glycohemoglobin) (83036-A1C) T-Vitamin D (25-Hydroxy) (02725-36644)  Complete Medication List: 1)  Lasix 20 Mg Tabs (Furosemide) .Marland Kitchen.. 1 tab once daily 2)  Ecotrin 325 Mg Tbec (Aspirin) .Marland Kitchen.. 1p o once daily 3)  Iron 325 (65 Fe) Mg Tabs (Ferrous sulfate) .Marland Kitchen.. 1 tab once daily 4)  Hydroxyzine Hcl 10 Mg Tabs (Hydroxyzine hcl) .Marland Kitchen.. 1-2 by mouth q6 hours as needed for hives 5)  Sertraline Hcl 50 Mg Tabs (Sertraline hcl) .... One by mouth once daily  for depression 6)  Ibuprofen 800 Mg Tabs (Ibuprofen) 7)  Hydrocodone-acetaminophen 5-500 Mg Tabs (Hydrocodone-acetaminophen) .... As needed  Patient Instructions: 1)  Please schedule a follow-up appointment in 2 months. 2)  It is important that you exercise regularly at least 20 minutes 5 times a week. If you develop chest pain, have severe difficulty breathing, or feel very tired , stop exercising immediately and seek medical attention. 3)  You need to lose weight. Consider a lower calorie diet and regular exercise.    Orders Added: 1)  Venipuncture [36415] 2)  TLB-BMP (Basic Metabolic Panel-BMET) [80048-METABOL] 3)  TLB-IBC Pnl (Iron/FE;Transferrin) [83550-IBC] 4)  TLB-CBC Platelet - w/Differential [85025-CBCD] 5)  TLB-A1C / Hgb A1C (Glycohemoglobin) [83036-A1C] 6)  T-Vitamin D (25-Hydroxy) [03474-25956] 7)  Est. Patient Level IV [38756]

## 2010-08-26 NOTE — Letter (Signed)
Summary: Results Follow-up Letter  [facility]  [address] [place], [place] [address]   Phone: [phone]  Fax: [phone]    08/19/2010  [address] [place], [place]  [address]  Dear Ms. Quale,   The following are the results of your recent test(s):  Test     Result     Vitamin D     low Iron       low CBC       mild anemia Blood sugars    "pre-diabetes"    _________________________________________________________  Please call for an appointment soon _________________________________________________________ _________________________________________________________ _________________________________________________________  Sincerely,  Sanda Linger MD [facility]

## 2010-08-26 NOTE — Progress Notes (Signed)
Summary: REQ FOR NOTE  Phone Note Call from Patient Call back at Home Phone [phone]   Summary of Call: Patient is requesting a note from MD - return to work on Monday on light duty. OK?  Initial call taken by: Lamar Sprinkles, CMA,  August 21, 2010 11:17 AM  Follow-up for Phone Call        sure Follow-up by: [name] MD,  August 21, 2010 11:38 AM  Additional Follow-up for Phone Call Additional follow up Details #1::        Done, Pt informed  Additional Follow-up by: Lamar Sprinkles, CMA,  August 21, 2010 11:53 AM

## 2010-08-27 ENCOUNTER — Encounter: Payer: Self-pay | Admitting: Internal Medicine

## 2010-08-27 ENCOUNTER — Ambulatory Visit (INDEPENDENT_AMBULATORY_CARE_PROVIDER_SITE_OTHER): Payer: 59 | Admitting: Internal Medicine

## 2010-08-27 ENCOUNTER — Telehealth (INDEPENDENT_AMBULATORY_CARE_PROVIDER_SITE_OTHER): Payer: Self-pay | Admitting: *Deleted

## 2010-08-27 DIAGNOSIS — R7309 Other abnormal glucose: Secondary | ICD-10-CM

## 2010-08-27 DIAGNOSIS — F3289 Other specified depressive episodes: Secondary | ICD-10-CM

## 2010-08-27 DIAGNOSIS — E559 Vitamin D deficiency, unspecified: Secondary | ICD-10-CM

## 2010-08-27 DIAGNOSIS — N920 Excessive and frequent menstruation with regular cycle: Secondary | ICD-10-CM | POA: Insufficient documentation

## 2010-08-27 DIAGNOSIS — G4700 Insomnia, unspecified: Secondary | ICD-10-CM | POA: Insufficient documentation

## 2010-08-27 DIAGNOSIS — D509 Iron deficiency anemia, unspecified: Secondary | ICD-10-CM

## 2010-08-27 DIAGNOSIS — F329 Major depressive disorder, single episode, unspecified: Secondary | ICD-10-CM

## 2010-08-27 DIAGNOSIS — I2789 Other specified pulmonary heart diseases: Secondary | ICD-10-CM

## 2010-08-31 ENCOUNTER — Encounter: Payer: Self-pay | Admitting: Internal Medicine

## 2010-08-31 ENCOUNTER — Ambulatory Visit (INDEPENDENT_AMBULATORY_CARE_PROVIDER_SITE_OTHER): Payer: 59 | Admitting: Internal Medicine

## 2010-08-31 ENCOUNTER — Telehealth: Payer: Self-pay | Admitting: Internal Medicine

## 2010-08-31 DIAGNOSIS — L509 Urticaria, unspecified: Secondary | ICD-10-CM | POA: Insufficient documentation

## 2010-08-31 DIAGNOSIS — L501 Idiopathic urticaria: Secondary | ICD-10-CM

## 2010-08-31 DIAGNOSIS — L2089 Other atopic dermatitis: Secondary | ICD-10-CM

## 2010-09-01 ENCOUNTER — Ambulatory Visit: Payer: 59 | Admitting: Psychiatry

## 2010-09-01 DIAGNOSIS — F3289 Other specified depressive episodes: Secondary | ICD-10-CM

## 2010-09-01 DIAGNOSIS — F329 Major depressive disorder, single episode, unspecified: Secondary | ICD-10-CM

## 2010-09-01 DIAGNOSIS — F6389 Other impulse disorders: Secondary | ICD-10-CM

## 2010-09-03 ENCOUNTER — Telehealth: Payer: Self-pay | Admitting: Internal Medicine

## 2010-09-03 NOTE — Assessment & Plan Note (Signed)
Summary: PER PT RECEIVED LETTER TO SCHED--TEST RESULTS--L SHOULDER PAI...   Vital Signs:  Patient profile:   45 year old female Menstrual status:  irregular Height:      67 inches Weight:      292 pounds BMI:     45.90 O2 Sat:      94 % on Room air Temp:     98.7 degrees F oral Pulse rate:   66 / minute Pulse rhythm:   regular Resp:     16 per minute BP sitting:   116 / 80  (right arm) Cuff size:   large  Vitals Entered By: Rock Nephew CMA (August 27, 2010 10:13 AM)  Nutrition Counseling: Patient's BMI is greater than 25 and therefore counseled on weight management options.  O2 Flow:  Room air  Primary Care Provider:  [name] MD  CC:  Insomnia.  History of Present Illness:  Insomnia      This is a 45 year old woman who presents with Insomnia.  The symptoms began 4-6 months ago.  The severity is described as moderate.  The patient reports difficulty falling asleep and frequent awakening, but denies early awakening, nightmares, leg movements, snoring, apnea noted by partner, and daytime somnolence.  Associated symptoms include weight gain.  Risk factors for insomnia include obesity.  Behaviors that may contribute to insomnia include reading in bed.  Treatments tried in the past and found to be ineffective incude behavior modification, hypnosis, relaxation techniques, and sedatives.    Preventive Screening-Counseling & Management  Alcohol-Tobacco     Alcohol drinks/day: 0     Alcohol Counseling: not indicated; patient does not drink     Smoking Status: never     Tobacco Counseling: not indicated; no tobacco use  Hep-HIV-STD-Contraception     Hepatitis Risk: no risk noted     HIV Risk: no risk noted     STD Risk: no risk noted      Sexual History:  currently monogamous.        Drug Use:  no.    Clinical Review Panels:  Prevention   Last Mammogram:  normal (02/16/2010)   Last Pap Smear:  normal (02/16/2010)  Immunizations   Last Tetanus Booster:  Tdap  (08/19/2009)  Diabetes Management   HgBA1C:  5.9 (08/18/2010)   Creatinine:  0.7 (08/18/2010)  CBC   WBC:  6.7 (08/18/2010)   RBC:  4.16 (08/18/2010)   Hgb:  11.3 (08/18/2010)   Hct:  34.0 (08/18/2010)   Platelets:  333.0 (08/18/2010)   MCV  81.7 (08/18/2010)   MCHC  33.3 (08/18/2010)   RDW  16.4 (08/18/2010)   PMN:  60.6 (08/18/2010)   Lymphs:  34.0 (08/18/2010)   Monos:  3.6 (08/18/2010)   Eosinophils:  1.7 (08/18/2010)   Basophil:  0.1 (08/18/2010)  Complete Metabolic Panel   Glucose:  104 (08/18/2010)   Sodium:  142 (08/18/2010)   Potassium:  4.2 (08/18/2010)   Chloride:  104 (08/18/2010)   CO2:  30 (08/18/2010)   BUN:  8 (08/18/2010)   Creatinine:  0.7 (08/18/2010)   Albumin:  3.5 (04/06/2010)   Total Protein:  7.0 (04/06/2010)   Calcium:  9.3 (08/18/2010)   Total Bili:  0.4 (04/06/2010)   Alk Phos:  56 (04/06/2010)   SGPT (ALT):  14 (04/06/2010)   SGOT (AST):  17 (04/06/2010)   Medications Prior to Update: 1)  Lasix 20 Mg Tabs (Furosemide) .Marland Kitchen.. 1 Tab Once Daily 2)  Ecotrin 325 Mg Tbec (  Aspirin) .Marland Kitchen.. 1p O Once Daily 3)  Iron 325 (65 Fe) Mg Tabs (Ferrous Sulfate) .Marland Kitchen.. 1 Tab Once Daily 4)  Hydroxyzine Hcl 10 Mg Tabs (Hydroxyzine Hcl) .Marland Kitchen.. 1-2 By Mouth Q6 Hours As Needed For Hives 5)  Sertraline Hcl 50 Mg Tabs (Sertraline Hcl) .... One By Mouth Once Daily For Depression 6)  Ibuprofen 800 Mg Tabs (Ibuprofen) 7)  Hydrocodone-Acetaminophen 5-500 Mg Tabs (Hydrocodone-Acetaminophen) .... As Needed  Current Medications (verified): 1)  Lasix 20 Mg Tabs (Furosemide) .Marland Kitchen.. 1 Tab Once Daily 2)  Ecotrin 325 Mg Tbec (Aspirin) .Marland Kitchen.. 1p O Once Daily 3)  Iron 325 (65 Fe) Mg Tabs (Ferrous Sulfate) .Marland Kitchen.. 1 By Mouth Three Times A Day With Food 4)  Hydroxyzine Hcl 10 Mg Tabs (Hydroxyzine Hcl) .Marland Kitchen.. 1-2 By Mouth Q6 Hours As Needed For Hives 5)  Sertraline Hcl 50 Mg Tabs (Sertraline Hcl) .... One By Mouth Once Daily For Depression 6)  Zolpidem Tartrate 10 Mg Tabs (Zolpidem Tartrate)  .... One By Mouth At Bedtime As Needed For Insomnia 7)  [facility]vs Vitamin D 2000 Unit Caps (Cholecalciferol) .... 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  Alcohol Use - no Regular Exercise - no Drug Use - no  Risk Factors: Alcohol Use: 0 (08/27/2010) Exercise: no (08/12/2008)  Risk Factors: Smoking Status: never (08/27/2010)  Family History: Reviewed history from 08/19/2009 and no changes required. Family History of Coronary Artery Disease: mother and father mother and father with HTN, DM sister with anxiety mother with breast cancer  Social History: Reviewed history from 08/19/2009 and no changes required. 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  Alcohol Use - no Regular Exercise - no Drug Use - no  Review of Systems       The patient complains of weight gain.  The patient denies anorexia, fever, weight loss, chest pain, syncope, dyspnea on exertion, peripheral edema, prolonged cough, headaches, hemoptysis, abdominal pain, hematuria, suspicious skin lesions, transient blindness, difficulty walking, depression, unusual weight change, and enlarged lymph nodes.   GI:  Denies abdominal pain, change in bowel habits, diarrhea, excessive appetite, gas, indigestion, loss of appetite, nausea, and vomiting. GU:  Complains of abnormal vaginal bleeding; denies dysuria,  hematuria, incontinence, nocturia, urinary frequency, and urinary hesitancy. Psych:  Denies alternate hallucination ( auditory/visual), anxiety, depression, easily angered, easily tearful, irritability, mental problems, panic attacks, sense of great danger, suicidal thoughts/plans, thoughts of violence, and unusual visions or sounds. Heme:  Denies abnormal bruising, enlarge lymph nodes, fevers, pallor, and skin discoloration.  Physical Exam  General:  alert, well-developed, well-nourished, well-hydrated, appropriate dress, normal appearance, healthy-appearing, and cooperative to examination.  overweight-appearing.   Head:  normocephalic, atraumatic, no abnormalities observed, and no abnormalities palpated.   Mouth:  good dentition, no gingival abnormalities, no dental plaque, and pharynx pink and moist.   Neck:  No deformities, masses, or tenderness noted. Lungs:  normal respiratory effort, no intercostal retractions, no accessory muscle use,  normal breath sounds, no dullness, no fremitus, no crackles, and no wheezes.   Heart:  normal rate, regular rhythm, no murmur, no gallop, no rub, and no JVD.   Abdomen:  soft, non-tender, normal bowel sounds, no distention, no masses, no guarding, no rigidity, no rebound tenderness, no abdominal hernia, no inguinal hernia, no hepatomegaly, and no splenomegaly.   Msk:  normal ROM, no joint tenderness, no joint swelling, no joint warmth, no redness over joints, no joint deformities, no joint instability, no crepitation, and no muscle atrophy.   Pulses:  R and L carotid,radial,femoral,dorsalis pedis and posterior tibial pulses are full and equal bilaterally Extremities:  No clubbing, cyanosis, edema, or deformity noted with normal full range of motion of all joints.   Neurologic:  No cranial nerve deficits noted. Station and gait are normal. Plantar reflexes are down-going bilaterally. DTRs are symmetrical throughout. Sensory, motor and coordinative functions  appear intact. Skin:  Intact without suspicious lesions or rashes Cervical Nodes:  no anterior cervical adenopathy and no posterior cervical adenopathy.   Psych:  Oriented X3, memory intact for recent and remote, normally interactive, good eye contact, not anxious appearing, not depressed appearing, not agitated, not suicidal, and not homicidal.     Impression & Recommendations:  Problem # 1:  HYPERGLYCEMIA (ICD-790.29) Assessment Unchanged  Problem # 2:  VITAMIN D DEFICIENCY (ICD-268.9) Assessment: Deteriorated  Problem # 3:  ANEMIA-IRON DEFICIENCY (ICD-280.9) Assessment: Deteriorated  Her updated medication list for this problem includes:    Iron 325 (65 Fe) Mg Tabs (Ferrous sulfate) .Marland Kitchen... 1 by mouth three times a day with food  Problem # 4:  INSOMNIA-SLEEP DISORDER-UNSPEC (ICD-780.52) Assessment: New  Her updated medication list for this problem includes:    Zolpidem Tartrate 10 Mg Tabs (Zolpidem tartrate) ..... One by mouth at bedtime as needed for insomnia  Problem # 5:  DEPRESSIVE DISORDER (ICD-311) Assessment: Improved  Her updated medication list for this problem includes:    Hydroxyzine Hcl 10 Mg Tabs (Hydroxyzine hcl) .Marland Kitchen... 1-2 by mouth q6 hours as needed for hives    Sertraline Hcl 50 Mg Tabs (Sertraline hcl) ..... One by mouth once daily for depression  Problem # 6:  PULMONARY HYPERTENSION (ICD-416.8) Assessment: Unchanged  Orders: Cardiology Referral (Cardiology)  Complete Medication List: 1)  Lasix 20 Mg Tabs (Furosemide) .Marland Kitchen.. 1 tab once daily 2)  Ecotrin 325 Mg Tbec (Aspirin) .Marland Kitchen.. 1p o once daily 3)  Iron 325 (65 Fe) Mg Tabs (Ferrous sulfate) .Marland Kitchen.. 1 by mouth three times a day with food 4)  Hydroxyzine Hcl 10 Mg Tabs (Hydroxyzine hcl) .Marland Kitchen.. 1-2 by mouth q6 hours as needed for hives 5)  Sertraline Hcl 50 Mg Tabs (Sertraline hcl) .... One by mouth once daily for depression 6)  Zolpidem Tartrate 10 Mg Tabs (Zolpidem tartrate) .... One by mouth at bedtime as  needed for insomnia 7)  Cvs Vitamin D 2000 Unit Caps (Cholecalciferol) .... One by mouth once daily  Other Orders: Gynecologic Referral (Gyn)  Patient Instructions: 1)  Please schedule a follow-up appointment in 2 months. 2)  It is important that you exercise regularly at least 20 minutes 5 times a week. If you develop chest pain, have severe difficulty breathing, or feel very tired , stop exercising immediately and seek medical attention. 3)  You need to lose weight. Consider a lower calorie diet and regular exercise.  Prescriptions: [facility] VITAMIN D 2000 UNIT CAPS (CHOLECALCIFEROL) One by mouth once daily  #30 x 11   Entered and Authorized  by:   [name] MD   Signed by:   [name] MD on 08/27/2010   Method used:   Print then Give to Patient   RxID:   [id number] ZOLPIDEM TARTRATE 10 MG TABS (ZOLPIDEM TARTRATE) One by mouth at bedtime as needed for insomnia  #30 x 3   Entered and Authorized by:   [name] MD   Signed by:   [name] MD on 08/27/2010   Method used:   Print then Give to Patient   RxID:   [id number] IRON 325 (65 FE) MG TABS (FERROUS SULFATE) 1 by mouth three times a day with food  #90 x 11   Entered and Authorized by:   [name] MD   Signed by:   [name] MD on 08/27/2010   Method used:   Print then Give to Patient   RxID:   [id number]    Orders Added: 1)  Cardiology Referral [Cardiology] 2)  Gynecologic Referral [Gyn] 3)  Est. Patient Level IV [01027]

## 2010-09-03 NOTE — Progress Notes (Signed)
  Phone Note Other Incoming   Request: Send information Summary of Call: Request for records received from [employer]. Request forwarded to [employer].  03/19/2010 to present-T. Yetta Barre

## 2010-09-04 ENCOUNTER — Encounter: Payer: Self-pay | Admitting: Cardiology

## 2010-09-04 ENCOUNTER — Ambulatory Visit (INDEPENDENT_AMBULATORY_CARE_PROVIDER_SITE_OTHER): Payer: 59 | Admitting: Cardiology

## 2010-09-04 DIAGNOSIS — Q2111 Secundum atrial septal defect: Secondary | ICD-10-CM

## 2010-09-04 DIAGNOSIS — Q211 Atrial septal defect: Secondary | ICD-10-CM

## 2010-09-04 DIAGNOSIS — I2789 Other specified pulmonary heart diseases: Secondary | ICD-10-CM

## 2010-09-04 DIAGNOSIS — R9431 Abnormal electrocardiogram [ECG] [EKG]: Secondary | ICD-10-CM

## 2010-09-04 HISTORY — DX: Secundum atrial septal defect: Q21.11

## 2010-09-04 HISTORY — DX: Atrial septal defect: Q21.1

## 2010-09-08 ENCOUNTER — Telehealth: Payer: Self-pay | Admitting: Internal Medicine

## 2010-09-09 ENCOUNTER — Ambulatory Visit (INDEPENDENT_AMBULATORY_CARE_PROVIDER_SITE_OTHER): Payer: 59 | Admitting: Psychiatry

## 2010-09-09 DIAGNOSIS — F6389 Other impulse disorders: Secondary | ICD-10-CM

## 2010-09-09 DIAGNOSIS — F329 Major depressive disorder, single episode, unspecified: Secondary | ICD-10-CM

## 2010-09-09 DIAGNOSIS — F3289 Other specified depressive episodes: Secondary | ICD-10-CM

## 2010-09-09 NOTE — Progress Notes (Signed)
  Phone Note Call from Patient   Caller: Patient Summary of Call: Patient lmovm c/o itching, hives and swelling. Patient was scheduled for appt today.Alvy Beal Archie CMA  August 31, 2010 9:39 AM

## 2010-09-09 NOTE — Letter (Signed)
Summary: Out of Work  [facility]  [address] [place], [place] [address]   Phone: [phone]  Fax: [phone]    August 31, 2010   Employee:  SHARIS KEERAN Esquer    To Whom It May Concern:   For Medical reasons, please excuse the above named employee from work for the following dates:  08/31/2010  If you need additional information, please feel free to contact our office.         Sincerely,    Alvy Beal A CMA for Dr. Sanda Linger

## 2010-09-09 NOTE — Progress Notes (Signed)
  Phone Note Call from Patient   Caller: Patient Summary of Call: Patient called stating that she returned to work x 3 days ago. She got to work today and started to have chest pain. She said that once see got home this seem to resolve. Patient has appt with Cardiology tomarrow and feel that she will be fine until then. Patient was told to seek urgent care/ER if symptoms get worse over night.Marland KitchenMarland KitchenAlvy Beal Archie CMA  September 03, 2010 2:51 PM

## 2010-09-09 NOTE — Assessment & Plan Note (Signed)
Summary: HIVES /NWS   Vital Signs:  Patient profile:   45 year old female Menstrual status:  irregular Height:      67 inches Weight:      292 pounds BMI:     45.90 O2 Sat:      98 % on Room air Temp:     98.7 degrees F oral Pulse rate:   79 / minute Pulse rhythm:   regular Resp:     16 per minute BP sitting:   122 / 80  (left arm) Cuff size:   large  Vitals Entered By: Rock Nephew CMA (August 31, 2010 10:02 AM)  Nutrition Counseling: Patient's BMI is greater than 25 and therefore counseled on weight management options.  O2 Flow:  Room air CC: Patient c/o itching, hives and swelling, Rash Is Patient Diabetic? No Pain Assessment Patient in pain? no       Does patient need assistance? Functional Status Self care Ambulation Normal   Primary Care Provider:  [name] MD  CC:  Patient c/o itching, hives and swelling, and Rash.  History of Present Illness:  Rash      This is a 45 year old woman who presents with Rash.  The symptoms began 3 days ago.  The severity is described as moderate.  The patient reports hives and itching, but denies macules, papules, nodules, pustules, blisters, ulcers, scaling, weeping, oozing, redness, increased warmth, and tenderness.  The rash is located on the face/trunk/limbs diffusely.  The rash is worse with heat and worse with scratching.  The patient denies the following symptoms: fever, headache, facial swelling, tongue swelling, burning, difficulty breathing, abdominal pain, nausea, vomiting, diarrhea, dizziness, sore throat, dysuria, eye symptoms, arthralgias, and vaginal discharge.  The patient denies history of recent tick bite, recent tick exposure, other insect bite, recent infection, recent antibiotic use, new medication, new clothing, new topical exposure, recent travel, pet/animal contact, thyroid disease, chronic liver disease, autoimmune disease, chronic edema, and prior STD.    Preventive Screening-Counseling &  Management  Alcohol-Tobacco     Alcohol drinks/day: 0     Alcohol Counseling: not indicated; patient does not drink     Smoking Status: never     Tobacco Counseling: not indicated; no tobacco use  Hep-HIV-STD-Contraception     Hepatitis Risk: no risk noted     HIV Risk: no risk noted     STD Risk: no risk noted  Clinical Review Panels:  Prevention   Last Mammogram:  normal (02/16/2010)   Last Pap Smear:  normal (02/16/2010)  Immunizations   Last Tetanus Booster:  Tdap (08/19/2009)  Diabetes Management   HgBA1C:  5.9 (08/18/2010)   Creatinine:  0.7 (08/18/2010)  CBC   WBC:  6.7 (08/18/2010)   RBC:  4.16 (08/18/2010)   Hgb:  11.3 (08/18/2010)   Hct:  34.0 (08/18/2010)   Platelets:  333.0 (08/18/2010)   MCV  81.7 (08/18/2010)   MCHC  33.3 (08/18/2010)   RDW  16.4 (08/18/2010)   PMN:  60.6 (08/18/2010)   Lymphs:  34.0 (08/18/2010)   Monos:  3.6 (08/18/2010)   Eosinophils:  1.7 (08/18/2010)   Basophil:  0.1 (08/18/2010)  Complete Metabolic Panel   Glucose:  104 (08/18/2010)   Sodium:  142 (08/18/2010)   Potassium:  4.2 (08/18/2010)   Chloride:  104 (08/18/2010)   CO2:  30 (08/18/2010)   BUN:  8 (08/18/2010)   Creatinine:  0.7 (08/18/2010)   Albumin:  3.5 (04/06/2010)   Total Protein:  7.0 (04/06/2010)   Calcium:  9.3 (08/18/2010)   Total Bili:  0.4 (04/06/2010)   Alk Phos:  56 (04/06/2010)   SGPT (ALT):  14 (04/06/2010)   SGOT (AST):  17 (04/06/2010)   Medications Prior to Update: 1)  Lasix 20 Mg Tabs (Furosemide) .Marland Kitchen.. 1 Tab Once Daily 2)  Ecotrin 325 Mg Tbec (Aspirin) .Marland Kitchen.. 1p O Once Daily 3)  Iron 325 (65 Fe) Mg Tabs (Ferrous Sulfate) .Marland Kitchen.. 1 By Mouth Three Times A Day With Food 4)  Hydroxyzine Hcl 10 Mg Tabs (Hydroxyzine Hcl) .Marland Kitchen.. 1-2 By Mouth Q6 Hours As Needed For Hives 5)  Sertraline Hcl 50 Mg Tabs (Sertraline Hcl) .... One By Mouth Once Daily For Depression 6)  Zolpidem Tartrate 10 Mg Tabs (Zolpidem Tartrate) .... One By Mouth At Bedtime As Needed For  Insomnia 7)  Cvs Vitamin D 2000 Unit Caps (Cholecalciferol) .... One By Mouth Once Daily  Current Medications (verified): 1)  Lasix 20 Mg Tabs (Furosemide) .Marland Kitchen.. 1 Tab Once Daily 2)  Ecotrin 325 Mg Tbec (Aspirin) .Marland Kitchen.. 1p O Once Daily 3)  Iron 325 (65 Fe) Mg Tabs (Ferrous Sulfate) .Marland Kitchen.. 1 By Mouth Three Times A Day With Food 4)  Sertraline Hcl 50 Mg Tabs (Sertraline Hcl) .... One By Mouth Once Daily For Depression 5)  Zolpidem Tartrate 10 Mg Tabs (Zolpidem Tartrate) .... One By Mouth At Bedtime As Needed For Insomnia 6)  Cvs Vitamin D 2000 Unit Caps (Cholecalciferol) .... One By Mouth Once Daily 7)  Hydroxyzine Hcl 25 Mg Tabs (Hydroxyzine Hcl) .Marland Kitchen.. 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  Alcohol Use - no Regular Exercise - no Drug Use - no  Risk Factors: Alcohol Use: 0 (08/31/2010) Exercise: no (08/12/2008)  Risk Factors: Smoking Status: never (08/31/2010)  Family History: Reviewed history from 08/19/2009 and no changes required. Family History of Coronary Artery Disease: mother and father mother and father with HTN, DM sister with anxiety mother with breast cancer  Social History: Reviewed history from 08/19/2009 and no changes required. 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  Alcohol Use - no Regular Exercise - no Drug Use -  no  Review of Systems  The patient denies anorexia, fever, chest pain, syncope, dyspnea on exertion, peripheral edema, prolonged cough, headaches, hemoptysis, abdominal pain, suspicious skin lesions, enlarged lymph nodes, and angioedema.    Physical Exam  General:  alert, well-developed, well-nourished, well-hydrated, appropriate dress, normal appearance, healthy-appearing, and cooperative to examination.  overweight-appearing.   Head:  normocephalic, atraumatic, no abnormalities observed, and no abnormalities palpated.   Mouth:  good dentition, no gingival abnormalities, no dental plaque, and pharynx pink and moist.   Neck:  No deformities, masses, or tenderness noted. Lungs:  normal respiratory effort, no intercostal retractions, no accessory muscle use, normal breath sounds, no dullness, no fremitus, no crackles, and no wheezes.   Heart:  normal rate, regular rhythm, no murmur, no  gallop, no rub, and no JVD.   Abdomen:  soft, non-tender, normal bowel sounds, no distention, no masses, no guarding, no rigidity, no rebound tenderness, no abdominal hernia, no inguinal hernia, no hepatomegaly, and no splenomegaly.   Msk:  normal ROM, no joint tenderness, no joint swelling, no joint warmth, no redness over joints, no joint deformities, no joint instability, no crepitation, and no muscle atrophy.   Pulses:  R and L carotid,radial,femoral,dorsalis pedis and posterior tibial pulses are full and equal bilaterally Extremities:  No clubbing, cyanosis, edema, or deformity noted with normal full range of motion of all joints.   Neurologic:  No cranial nerve deficits noted. Station and gait are normal. Plantar reflexes are down-going bilaterally. DTRs are symmetrical throughout. Sensory, motor and coordinative functions appear intact. Skin:  skin is dry and there are diffuse linear excoriations and dermagraphism but I don't see any wheals, targets, vesicles, angioedema, erythema, streaking, pustules, or  induration. Cervical Nodes:  no anterior cervical adenopathy and no posterior cervical adenopathy.   Axillary Nodes:  no R axillary adenopathy and no L axillary adenopathy.   Inguinal Nodes:  no R inguinal adenopathy and no L inguinal adenopathy.   Psych:  Cognition and judgment appear intact. Alert and cooperative with normal attention span and concentration. No apparent delusions, illusions, hallucinations   Impression & Recommendations:  Problem # 1:  ECZEMA, ATOPIC (ICD-691.8) Assessment Deteriorated  Discussed use of medication and avoidance of irritating agents. Also stressed importance of moisturizers.   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 1 tab once daily 2)  Ecotrin 325 Mg Tbec (Aspirin) .Marland Kitchen.. 1p o once daily 3)  Iron 325 (65 Fe) Mg Tabs (Ferrous sulfate) .Marland Kitchen.. 1 by mouth three times a day with food 4)  Sertraline Hcl 50 Mg Tabs (Sertraline hcl) .... One by mouth once daily for depression 5)  Zolpidem Tartrate 10 Mg Tabs (Zolpidem tartrate) .... One by mouth at bedtime as needed for insomnia 6)  Cvs Vitamin D 2000 Unit Caps (Cholecalciferol) .... One by mouth once daily 7)  Hydroxyzine Hcl 25 Mg Tabs (Hydroxyzine hcl) .Marland Kitchen.. 1-2 by mouth qid as needed for itching/hives  Patient Instructions: 1)  Please schedule a follow-up appointment in 2 weeks. Prescriptions: HYDROXYZINE HCL 25 MG TABS (HYDROXYZINE HCL) 1-2 by mouth QID as needed for itching/hives  #75 x 11   Entered and Authorized by:   [name] MD   Signed by:   [name] MD on 08/31/2010   Method used:   Electronically to        [facility] [phone]* (retail)       [address]       [address]       [place], [place]  [address]       Ph: [phone]       Fax: [phone]   RxID:   [id number]    Orders Added: 1)  Est. Patient Level IV [73220]

## 2010-09-09 NOTE — Assessment & Plan Note (Signed)
Summary: pulmonary htn/per [facility] of[facility]/notes in emr/uhc/pt 272-83...   Visit Type:  follow-up from 2010 Primary Provider:  [name] MD  CC:  chest discomfort, anxiety, depression, stress, tightness, and sob...Marland Kitchenpulmonary htn.  History of Present Illness: Jessica Davenport RETURNS BECAUSE OF INCREASED SOB AND WEIGHT GAIN. sHE HAS BEEN THROUGH MAJOR DEPRESSION AND HAS GAINED 40 PLUS POUNDS. sHE DENIES ORTOPNEA, PND, PRESYCOPE, PALPS, OR INCREASED EDEMA. HER EKG SHOWS SINUS TACH WITH RAE, MORE PRONOUNCED THAN IN THE PAST  Current Medications (verified): 1)  Lasix 20 Mg Tabs (Furosemide) .Marland Kitchen.. 1 Tab Once Daily 2)  Ecotrin 325 Mg Tbec (Aspirin) .Marland Kitchen.. 1p O Once Daily 3)  Iron 325 (65 Fe) Mg Tabs (Ferrous Sulfate) .Marland Kitchen.. 1 By Mouth Three Times A Day With Food 4)  Sertraline Hcl 50 Mg Tabs (Sertraline Hcl) .... One By Mouth Once Daily For Depression 5)  Zolpidem Tartrate 10 Mg Tabs (Zolpidem Tartrate) .... One By Mouth At Bedtime As Needed For Insomnia 6)  Cvs Vitamin D 2000 Unit Caps (Cholecalciferol) .... One By Mouth Once Daily 7)  Hydroxyzine Hcl 25 Mg Tabs (Hydroxyzine Hcl) .Marland Kitchen.. 1-2 By Mouth Qid As Needed For Itching/hives 8)  Prenatal/iron  Tabs (Prenatal Multivit-Min-Fe-Fa) .... 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  Alcohol Use - no Regular Exercise - no Drug Use - no  Risk  Factors: Alcohol Use: 0 (08/31/2010) Exercise: no (08/12/2008)  Risk Factors: Smoking Status: never (08/31/2010)  Review of Systems       NEGGATIVE OTHER THAN HPI  Vital Signs:  Patient profile:   45 year old female Menstrual status:  irregular Height:      67 inches Weight:      289.25 pounds BMI:     45.47 Pulse rate:   100 / minute Resp:     18 per minute BP sitting:   120 / 82  (right arm) Cuff size:   regular  Vitals Entered By: Celestia Khat, CMA (September 04, 2010 3:49 PM)  Physical Exam  General:  MORBIDLY OBESE Head:  normocephalic and atraumatic Eyes:  PERRLA/EOM intact; conjunctiva and lids normal. Neck:  Neck supple, no JVD. No masses, thyromegaly or abnormal cervical nodes. Lungs:  Clear bilaterally to auscultation and percussion. Heart:  PMI NOT PALPABLE, SOFT S1 AND S2,  NO CAROTI BRUITS Msk:  decreased ROM.   Pulses:  pulses normal in all 4 extremities Extremities:  trace left pedal edema and trace right pedal edema.   Neurologic:  Alert and oriented x 3. Skin:  Intact without lesions or rashes. Psych:  Normal affect.   EKG  Procedure date:  09/04/2010  Findings:      SINUS TACH, RAE  Impression & Recommendations:  Problem # 1:  ELECTROCARDIOGRAM, ABNORMAL (ICD-794.31) Assessment Deteriorated I AM CONCERNED SHE IS DEVELOPING RECURRENT PULMONARY HTN, THIS TIME FROM MORBID OBESITY. SHE HAD SUBTANTIAL IMPROVEMENT IN HER RIGHT SIDED PARAMETERS AFTER ASD CLOSURE. STRONGLY ADVISED WEIGHT LOSS AND  EXERCISE...WALKING. WILL CHECK ECHO Her updated medication list for this problem includes:    Ecotrin 325 Mg Tbec (Aspirin) .Marland Kitchen... 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 Daleen Squibb 2)  Your physician recommends that you continue on your current medications as directed. Please refer to the Current Medication list given to you today. 3)  Your physician has requested that you have an echocardiogram.  Echocardiography is a painless test that uses sound waves to create images of your heart. It provides your doctor with information about the size and shape of your heart and how well your heart's chambers and valves are working.  This procedure takes approximately one hour. There are no restrictions for this procedure. 4)  Your physician encouraged you to lose weight for better health.  5)  Your physician discussed the importance of regular exercise and recommended that you start or continue a regular exercise program for good health. Walk 30 minutes daily  Prescriptions: LASIX 20 MG TABS (FUROSEMIDE) 1 tab once daily  #30 x 11   Entered by:   Lisabeth Devoid RN   Authorized by:   Gaylord Shih, MD, [facility]   Signed by:   Lisabeth Devoid RN on 09/04/2010   Method used:   Electronically to        [facility]* (retail)       [address] [place], [place]  [address]       Ph: [phone]       Fax: [phone]   RxID:   [id number]

## 2010-09-09 NOTE — Letter (Signed)
Summary: Work Writer, Main Office  [address]   [place], [place] [address]   Phone: [phone]  Fax: [phone]     September 04, 2010    [facility]   The above named patient had a medical visit today at:   3:00   pm.  Please take this into consideration when reviewing the time away from work/school.      Sincerely yours,     Dr. Valera [facility]

## 2010-09-14 ENCOUNTER — Encounter: Payer: Self-pay | Admitting: Internal Medicine

## 2010-09-14 ENCOUNTER — Ambulatory Visit (INDEPENDENT_AMBULATORY_CARE_PROVIDER_SITE_OTHER): Payer: 59 | Admitting: Internal Medicine

## 2010-09-14 DIAGNOSIS — I2789 Other specified pulmonary heart diseases: Secondary | ICD-10-CM

## 2010-09-14 DIAGNOSIS — F3289 Other specified depressive episodes: Secondary | ICD-10-CM

## 2010-09-14 DIAGNOSIS — F329 Major depressive disorder, single episode, unspecified: Secondary | ICD-10-CM

## 2010-09-14 DIAGNOSIS — L501 Idiopathic urticaria: Secondary | ICD-10-CM

## 2010-09-15 ENCOUNTER — Other Ambulatory Visit (HOSPITAL_COMMUNITY): Payer: 59

## 2010-09-15 NOTE — Progress Notes (Signed)
Summary: REQ FOR LETTER  Phone Note Call from Patient Call back at [phone]   Summary of Call: Patient is requesting advisement from Dr Yetta Barre. Going to GYN for eval of abn bleeding today. She was evaluated by cardiology and is waiting for echo apt. She says work does not understand that she cannot handle 8 hour days. Does pt need f/u office visit with you?  Initial call taken by: Lamar Sprinkles, CMA,  September 08, 2010 5:48 PM  Follow-up for Phone Call        no Follow-up by: [name] MD,  September 09, 2010 7:12 AM  Additional Follow-up for Phone Call Additional follow up Details #1::        Pt left vm req letter from MD stating that she can not work a 40 hour week due to her health problems. She feels that she can only work 4 hour days at the most.  Additional Follow-up by: Lamar Sprinkles, CMA,  September 10, 2010 9:45 AM    Additional Follow-up for Phone Call Additional follow up Details #2::    let GYN and Cardiology address this Follow-up by: [name] MD,  September 10, 2010 11:27 AM

## 2010-09-21 ENCOUNTER — Institutional Professional Consult (permissible substitution): Payer: 59 | Admitting: Internal Medicine

## 2010-09-22 ENCOUNTER — Telehealth: Payer: Self-pay | Admitting: Internal Medicine

## 2010-09-24 ENCOUNTER — Ambulatory Visit (INDEPENDENT_AMBULATORY_CARE_PROVIDER_SITE_OTHER): Payer: 59 | Admitting: Psychiatry

## 2010-09-24 DIAGNOSIS — F329 Major depressive disorder, single episode, unspecified: Secondary | ICD-10-CM

## 2010-09-24 DIAGNOSIS — F3289 Other specified depressive episodes: Secondary | ICD-10-CM

## 2010-09-24 NOTE — Letter (Signed)
Summary: [facility]  [address] [place], [place] [address]   Phone: [phone]  Fax: [phone]    Today's Date: September 14, 2010  Name of Patient: Jessica Davenport [facility]  The above named patient had a medical visit today at: 10 am .  Please take this into consideration when reviewing the time away from work/school.    Special Instructions:  [  ] None  [  ] To be off the remainder of today, returning to the normal work / school schedule tomorrow.  [  ] To be off until the next scheduled appointment on ______________________.  Arly.Keller  ] Other _________she can only work 5-6 hours per day_______________________________________________________ ________________________________________________________________________   Sincerely yours,   Sanda Linger MD

## 2010-09-24 NOTE — Assessment & Plan Note (Signed)
Summary: 2 wk f/u   Vital Signs:  Patient profile:   45 year old female Menstrual status:  irregular Height:      67 inches Weight:      289 pounds BMI:     45.43 O2 Sat:      98 % on Room air Temp:     98.6 degrees F oral Pulse rate:   72 / minute Pulse rhythm:   regular Resp:     16 per minute BP sitting:   124 / 82  (left arm) Cuff size:   large  Vitals Entered By: Rock Nephew CMA (September 14, 2010 9:56 AM)  O2 Flow:  Room air  Primary Care Provider:  [name] MD   History of Present Illness: She returns for f/up and she tells me that she needs a note that restricts her to only working for 5-6 hours a day because when she tries to work longer than that she develops chest pain, irritability, and crying spells.  Preventive Screening-Counseling & Management  Alcohol-Tobacco     Alcohol drinks/day: 0     Alcohol Counseling: not indicated; patient does not drink     Smoking Status: never     Tobacco Counseling: not indicated; no tobacco use  Hep-HIV-STD-Contraception     Hepatitis Risk: no risk noted     HIV Risk: no risk noted     STD Risk: no risk noted      Sexual History:  currently monogamous.        Drug Use:  no.    Clinical Review Panels:  Prevention   Last Mammogram:  normal (02/16/2010)   Last Pap Smear:  normal (02/16/2010)  Immunizations   Last Tetanus Booster:  Tdap (08/19/2009)  Diabetes Management   HgBA1C:  5.9 (08/18/2010)   Creatinine:  0.7 (08/18/2010)  CBC   WBC:  6.7 (08/18/2010)   RBC:  4.16 (08/18/2010)   Hgb:  11.3 (08/18/2010)   Hct:  34.0 (08/18/2010)   Platelets:  333.0 (08/18/2010)   MCV  81.7 (08/18/2010)   MCHC  33.3 (08/18/2010)   RDW  16.4 (08/18/2010)   PMN:  60.6 (08/18/2010)   Lymphs:  34.0 (08/18/2010)   Monos:  3.6 (08/18/2010)   Eosinophils:  1.7 (08/18/2010)   Basophil:  0.1 (08/18/2010)  Complete Metabolic Panel   Glucose:  104 (08/18/2010)   Sodium:  142 (08/18/2010)   Potassium:  4.2  (08/18/2010)   Chloride:  104 (08/18/2010)   CO2:  30 (08/18/2010)   BUN:  8 (08/18/2010)   Creatinine:  0.7 (08/18/2010)   Albumin:  3.5 (04/06/2010)   Total Protein:  7.0 (04/06/2010)   Calcium:  9.3 (08/18/2010)   Total Bili:  0.4 (04/06/2010)   Alk Phos:  56 (04/06/2010)   SGPT (ALT):  14 (04/06/2010)   SGOT (AST):  17 (04/06/2010)   Medications Prior to Update: 1)  Lasix 20 Mg Tabs (Furosemide) .Marland Kitchen.. 1 Tab Once Daily 2)  Ecotrin 325 Mg Tbec (Aspirin) .Marland Kitchen.. 1p O Once Daily 3)  Iron 325 (65 Fe) Mg Tabs (Ferrous Sulfate) .Marland Kitchen.. 1 By Mouth Three Times A Day With Food 4)  Sertraline Hcl 50 Mg Tabs (Sertraline Hcl) .... One By Mouth Once Daily For Depression 5)  Zolpidem Tartrate 10 Mg Tabs (Zolpidem Tartrate) .... One By Mouth At Bedtime As Needed For Insomnia 6)  Cvs Vitamin D 2000 Unit Caps (Cholecalciferol) .... One By Mouth Once Daily 7)  Hydroxyzine Hcl 25 Mg Tabs (Hydroxyzine Hcl) .Marland KitchenMarland KitchenMarland Kitchen  1-2 By Mouth Qid As Needed For Itching/hives 8)  Prenatal/iron  Tabs (Prenatal Multivit-Min-Fe-Fa) .... Take 1 Tablet By Mouth Once A Day  Current Medications (verified): 1)  Lasix 20 Mg Tabs (Furosemide) .Marland Kitchen.. 1 Tab Once Daily 2)  Ecotrin 325 Mg Tbec (Aspirin) .Marland Kitchen.. 1p O Once Daily 3)  Iron 325 (65 Fe) Mg Tabs (Ferrous Sulfate) .Marland Kitchen.. 1 By Mouth Three Times A Day With Food 4)  Sertraline Hcl 50 Mg Tabs (Sertraline Hcl) .... One By Mouth Once Daily For Depression 5)  Zolpidem Tartrate 10 Mg Tabs (Zolpidem Tartrate) .... One By Mouth At Bedtime As Needed For Insomnia 6)  Cvs Vitamin D 2000 Unit Caps (Cholecalciferol) .... One By Mouth Once Daily 7)  Hydroxyzine Hcl 25 Mg Tabs (Hydroxyzine Hcl) .Marland Kitchen.. 1-2 By Mouth Qid As Needed For Itching/hives 8)  Prenatal/iron  Tabs (Prenatal Multivit-Min-Fe-Fa) .... 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  Alcohol Use - no Regular Exercise - no Drug Use - no  Risk Factors: Alcohol Use: 0 (09/14/2010) Exercise: no (08/12/2008)  Risk Factors: Smoking Status: never (09/14/2010)  Family History: Reviewed history from 08/19/2009 and no changes required. Family History of Coronary Artery Disease: mother and father mother and father with HTN, DM sister with anxiety mother with breast cancer  Social History: Reviewed history from 08/19/2009 and no changes required. 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  Alcohol Use - no Regular Exercise - no Drug Use - no  Review of Systems       The patient complains of weight gain, chest pain, and depression.  The patient denies anorexia, fever, weight loss, syncope, dyspnea on exertion, peripheral edema, prolonged cough, headaches, hemoptysis, abdominal pain, hematuria, suspicious skin lesions, difficulty walking, abnormal bleeding, enlarged lymph nodes, and angioedema.   Derm:  Denies changes in color of skin, changes in nail beds, dryness, excessive perspiration, flushing, hair loss, insect bite(s), itching, lesion(s), poor wound healing, and rash. Psych:  Complains of easily angered, easily tearful, irritability, and panic attacks; denies alternate hallucination ( auditory/visual), anxiety, depression, mental problems, sense of great danger, suicidal thoughts/plans, thoughts of violence, unusual visions or sounds, and thoughts /plans of harming others.  Physical  Exam  General:  alert, well-developed, well-nourished, well-hydrated, appropriate dress, normal appearance, healthy-appearing, and cooperative to examination.  overweight-appearing.   Head:  normocephalic, atraumatic, no abnormalities observed, and no abnormalities palpated.   Mouth:  good dentition, no gingival abnormalities, no dental plaque, and pharynx pink and moist.   Neck:  No deformities, masses, or tenderness noted. Lungs:  normal respiratory effort, no intercostal retractions, no accessory muscle use, normal breath sounds, no dullness, no fremitus, no crackles, and no wheezes.   Heart:  normal rate, regular rhythm, no murmur, no gallop, no rub, and no JVD.   Abdomen:  soft, non-tender, normal bowel sounds, no distention, no masses, no guarding, no rigidity, no rebound tenderness, no abdominal  hernia, no inguinal hernia, no hepatomegaly, and no splenomegaly.   Msk:  normal ROM, no joint tenderness, no joint swelling, no joint warmth, no redness over joints, no joint deformities, no joint instability, no crepitation, and no muscle atrophy.   Pulses:  R and L carotid,radial,femoral,dorsalis pedis and posterior tibial pulses are full and equal bilaterally Extremities:  No clubbing, cyanosis, edema, or deformity noted with normal full range of motion of all joints.   Neurologic:  No cranial nerve deficits noted. Station and gait are normal. Plantar reflexes are down-going bilaterally. DTRs are symmetrical throughout. Sensory, motor and coordinative functions appear intact. Skin:  turgor normal, color normal, no rashes, no suspicious lesions, no ecchymoses, no petechiae, no purpura, no ulcerations, and no edema.   Cervical Nodes:  no anterior cervical adenopathy and no posterior cervical adenopathy.   Psych:  Cognition and judgment appear intact. Alert and cooperative with normal attention span and concentration. No apparent delusions, illusions, hallucinations   Impression &  Recommendations:  Problem # 1:  DEPRESSIVE DISORDER (ICD-311) Assessment Unchanged  Her updated medication list for this problem includes:    Sertraline Hcl 50 Mg Tabs (Sertraline hcl) ..... One by mouth once daily for depression    Hydroxyzine Hcl 25 Mg Tabs (Hydroxyzine hcl) .Marland Kitchen... 1-2 by mouth qid as needed for itching/hives  Discussed treatment options, including trial of antidpressant medication. Will refer to behavioral health. Follow-up call in in 24-48 hours and recheck in 2 weeks, sooner as needed. Patient agrees to call if any worsening of symptoms or thoughts of doing harm arise. Verified that the patient has no suicidal ideation at this time.   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 1 tab once daily 2)  Ecotrin 325 Mg Tbec (Aspirin) .Marland Kitchen.. 1p o once daily 3)  Iron 325 (65 Fe) Mg Tabs (Ferrous sulfate) .Marland Kitchen.. 1 by mouth three times a day with food 4)  Sertraline Hcl 50 Mg Tabs (Sertraline hcl) .... One by mouth once daily for depression 5)  Zolpidem Tartrate 10 Mg Tabs (Zolpidem tartrate) .... One by mouth at bedtime as needed for insomnia 6)  Cvs Vitamin D 2000 Unit Caps (Cholecalciferol) .... One by mouth once daily 7)  Hydroxyzine Hcl 25 Mg Tabs (Hydroxyzine hcl) .Marland Kitchen.. 1-2 by mouth qid as needed for itching/hives 8)  Prenatal/iron Tabs (Prenatal multivit-min-fe-fa) .... Take 1 tablet by mouth once a day  Patient Instructions: 1)  Please schedule a follow-up appointment in 3 months. 2)  It is important that you exercise regularly at least 20 minutes 5 times a week. If you develop chest pain, have severe difficulty breathing, or feel very tired , stop exercising immediately and seek medical attention. 3)  You need to lose weight. Consider a lower calorie diet and regular exercise.  4)  Check your Blood Pressure  regularly. If it is above 130/80: you should make an appointment.   Orders Added: 1)  Est. Patient Level IV [16109]

## 2010-09-28 ENCOUNTER — Ambulatory Visit (HOSPITAL_COMMUNITY): Payer: 59 | Attending: Cardiology

## 2010-09-28 DIAGNOSIS — R079 Chest pain, unspecified: Secondary | ICD-10-CM | POA: Insufficient documentation

## 2010-09-28 DIAGNOSIS — E669 Obesity, unspecified: Secondary | ICD-10-CM | POA: Insufficient documentation

## 2010-09-28 DIAGNOSIS — Z8249 Family history of ischemic heart disease and other diseases of the circulatory system: Secondary | ICD-10-CM | POA: Insufficient documentation

## 2010-09-28 DIAGNOSIS — R9431 Abnormal electrocardiogram [ECG] [EKG]: Secondary | ICD-10-CM | POA: Insufficient documentation

## 2010-09-28 DIAGNOSIS — I517 Cardiomegaly: Secondary | ICD-10-CM | POA: Insufficient documentation

## 2010-09-29 ENCOUNTER — Telehealth: Payer: Self-pay | Admitting: Cardiology

## 2010-09-29 ENCOUNTER — Encounter: Payer: Self-pay | Admitting: Internal Medicine

## 2010-09-29 NOTE — Progress Notes (Signed)
Summary: FYI  Phone Note From Other Clinic   Caller: [place] - [phone] EXT [phone] Summary of Call: [employer] will be faxing form for MD to complete.  Initial call taken by: Lamar Sprinkles, CMA,  September 22, 2010 10:52 AM

## 2010-09-29 NOTE — Progress Notes (Signed)
Summary: call  Phone Note Call from Patient Call back at [phone]   Summary of Call: Patient is requesting a call back regarding time off work. Wants someone to call her so she can explain what she is asking.  Initial call taken by: Lamar Sprinkles, CMA,  September 22, 2010 9:30 AM  Follow-up for Phone Call        Spoke w/pt, advised her that [employer] will be faxing forms to MD's attention per their call to our office yesterday.  Follow-up by: Lamar Sprinkles, CMA,  September 23, 2010 4:23 PM

## 2010-10-06 NOTE — Progress Notes (Signed)
Summary: need a note for yesterday appt- job requesting this  Phone Note Call from Patient Call back at [facility] Phone [phone]   Caller: [phone]. Reason for Call: Talk to Nurse Summary of Call: pt had an echo on yesterday. pt job is requesting  a note stating she went to her appt on 3/12. pt will pick up at office today.  Initial call taken by: Lorne Skeens,  September 29, 2010 10:13 AM  Follow-up for Phone Call        note left at front desk, left vm for pt Jessica Staggers, RN  September 29, 2010 10:58 AM

## 2010-10-06 NOTE — Letter (Signed)
Summary: Work Writer, Main Office  [address]   [place], [place] [address]   Phone: [phone]  Fax: [phone]     September 29, 2010    [facility]   The above named patient had a medical visit at our office on 09/28/10.  Please take this into consideration when reviewing the time away from work/school.      Sincerely yours,  Architectural technologist

## 2010-10-15 ENCOUNTER — Ambulatory Visit: Payer: 59 | Admitting: Psychiatry

## 2010-10-20 ENCOUNTER — Ambulatory Visit: Payer: Self-pay | Admitting: Internal Medicine

## 2010-10-22 ENCOUNTER — Ambulatory Visit (INDEPENDENT_AMBULATORY_CARE_PROVIDER_SITE_OTHER): Payer: 59 | Admitting: Internal Medicine

## 2010-10-22 ENCOUNTER — Encounter: Payer: Self-pay | Admitting: Internal Medicine

## 2010-10-22 ENCOUNTER — Other Ambulatory Visit: Payer: 59

## 2010-10-22 ENCOUNTER — Other Ambulatory Visit (INDEPENDENT_AMBULATORY_CARE_PROVIDER_SITE_OTHER): Payer: 59

## 2010-10-22 DIAGNOSIS — I2789 Other specified pulmonary heart diseases: Secondary | ICD-10-CM

## 2010-10-22 DIAGNOSIS — L501 Idiopathic urticaria: Secondary | ICD-10-CM

## 2010-10-22 DIAGNOSIS — D509 Iron deficiency anemia, unspecified: Secondary | ICD-10-CM

## 2010-10-22 DIAGNOSIS — R7309 Other abnormal glucose: Secondary | ICD-10-CM

## 2010-10-22 LAB — CBC WITH DIFFERENTIAL/PLATELET
Basophils Absolute: 0 10*3/uL (ref 0.0–0.1)
Basophils Relative: 0.2 % (ref 0.0–3.0)
Eosinophils Absolute: 0.1 10*3/uL (ref 0.0–0.7)
MCHC: 33.1 g/dL (ref 30.0–36.0)
MCV: 82.5 fl (ref 78.0–100.0)
Monocytes Absolute: 0.3 10*3/uL (ref 0.1–1.0)
Neutro Abs: 3.6 10*3/uL (ref 1.4–7.7)
Neutrophils Relative %: 56 % (ref 43.0–77.0)
RBC: 3.96 Mil/uL (ref 3.87–5.11)
RDW: 15.4 % — ABNORMAL HIGH (ref 11.5–14.6)

## 2010-10-22 LAB — COMPREHENSIVE METABOLIC PANEL
AST: 14 U/L (ref 0–37)
Alkaline Phosphatase: 69 U/L (ref 39–117)
BUN: 10 mg/dL (ref 6–23)
Glucose, Bld: 87 mg/dL (ref 70–99)
Sodium: 140 mEq/L (ref 135–145)
Total Bilirubin: 0.4 mg/dL (ref 0.3–1.2)

## 2010-10-22 NOTE — Assessment & Plan Note (Signed)
I will recheck her CBC today 

## 2010-10-22 NOTE — Assessment & Plan Note (Signed)
I will monitor her lytes and renal function today

## 2010-10-22 NOTE — Progress Notes (Signed)
Subjective:     Jessica Davenport is a 45 y.o. female who presents for evaluation of a rash involving the lower extremity, trunk and upper extremity. Rash started 2 weeks ago. Lesions are pink, and raised in texture. Rash has changed over time. Rash is pruritic. Associated symptoms: none and none. Patient denies: abdominal pain, arthralgia, congestion, cough, crankiness, decrease in appetite, decrease in energy level, fever, headache, irritability, myalgia, nausea, sore throat and vomiting. Patient has had contacts with similar rash. Patient has not had new exposures (soaps, lotions, laundry detergents, foods, medications, plants, insects or animals).  The following portions of the patient's history were reviewed and updated as appropriate: allergies, current medications, past family history, past medical history, past social history, past surgical history and problem list.  Review of Systems Constitutional: positive for none, negative for anorexia, chills, fatigue, fevers, malaise, night sweats, sweats and weight loss Integument/breast: positive for pruritus and rash, negative for changed mole, dryness, skin color change and skin lesion(s)    Objective:    BP 122/70  Pulse 75  Temp(Src) 97.7 F (36.5 C) (Oral)  Ht 5\' 7"  (1.702 m)  Wt 300 lb 12 oz (136.419 kg)  BMI 47.10 kg/m2  SpO2 98% General:  alert, cooperative and no distress  Skin:  normal and wheals on both arms     Assessment:    hives    Plan:    Medications: continue hydroxyxine. Written patient instruction given. Follow up in 2 weeks.  I will check for food allergies at her request.

## 2010-10-22 NOTE — Assessment & Plan Note (Signed)
Will check her A1C and blood sugar today

## 2010-10-22 NOTE — Patient Instructions (Signed)
Hives (Urticaria) Hives (urticaria) are itchy, red, swollen patches on the skin. They may change size, shape, and location quickly and repeatedly. Hives that occur deeper in the skin can cause swelling of the hands, feet, and face. Hives may be an allergic reaction to something you or your child ate, touched, or put on the skin. Hives can also be a reaction to cold, heat, viral infections, medication, insect bites, or emotional stress. Often the cause is hard to find. Hives can come and go for several days to several weeks. Hives are not contagious. HOME CARE INSTRUCTIONS:  If the cause of the hives is known, avoid exposure to that source.   To relieve itching and rash:   Apply cold compresses to the skin or take cool water baths. Do not take or give your child hot baths or showers because the warmth will make the itching worse.   The best medicine for hives is an antihistamine. An antihistamine will not cure hives, but it will reduce their severity. You can use an antihistamine available over the counter, such as Benadryl. This medicine may make your child sleepy. Teenagers should not drive while using this medicine.   Take or give Benadryl every 6 hours until the hives are completely gone for 24 hours or as directed.   Your child may have other medications prescribed for itching. Give these as directed by your child's physician.   You or your child should wear loose fitting clothing, including undergarments. Skin irritations may make hives worse.   Follow-up as provided by your caregiver.  SEEK MEDICAL CARE IF:  You or your child still have considerable itching after taking the medication (prescribed or purchased over the counter).   An oral temperature above 100.5 develops.   Joint swelling or pain occurs.  SEEK IMMEDIATE MEDICAL CARE IF:  Swollen lips or tongue are noticed.   There is difficulty with breathing, swallowing, or tightness in the throat or chest.   Abdominal  (belly) pain develops.   Your child starts acting very sick.  These may be the first signs of a life-threatening allergic reaction. THIS IS AN EMERGENCY. Call 911 for medical help. MAKE SURE YOU:   Understand these instructions.   Will watch your condition.   Will get help right away if you are not doing well or get worse.  Document Released: 07/05/2005 Document Re-Released: 06/17/2008 [facility] Patient Information 2011 [place], [place].

## 2010-10-26 ENCOUNTER — Telehealth: Payer: Self-pay | Admitting: *Deleted

## 2010-10-26 MED ORDER — METHYLPREDNISOLONE (PAK) 4 MG PO TABS: 4.0000 mg | ORAL_TABLET | Freq: Every day | ORAL | Status: DC

## 2010-10-26 NOTE — Telephone Encounter (Signed)
Patient informed. 

## 2010-10-26 NOTE — Telephone Encounter (Signed)
Results are not back yet, try medrol dose pak

## 2010-10-26 NOTE — Telephone Encounter (Signed)
Pt is req results of allergy testing. She has gotten no relief from meds advised by MD at last ov. Hives have now spread to her feet also. Please advise.

## 2010-11-03 ENCOUNTER — Telehealth: Payer: Self-pay | Admitting: Cardiology

## 2010-11-03 NOTE — Telephone Encounter (Signed)
Pt calling re echo results. °

## 2010-11-03 NOTE — Telephone Encounter (Signed)
Left pt a message to call back regarding echo results. 2-D echo  done on 09/28/10.

## 2010-11-04 MED ORDER — FUROSEMIDE 20 MG PO TABS: 20.0000 mg | ORAL_TABLET | Freq: Every day | ORAL | Status: DC

## 2010-11-04 NOTE — Telephone Encounter (Signed)
Jessica Davenport calls today for echo results.  Results given to Jessica Davenport. She has increased her furosemide to 40mg  daily because of weight gain over past 2-3 weeks. Current weight today 296.  Last ov 289. (09/04/10). She is limiting salt intake and weighing daily. Discussed potassium rich diet while taking increased dose of furosemide. She wants Dr. Daleen Squibb to know she has had to increase furosemide. Refill furosemide called in to [facility].  I will forward to him for review. Mylo Red RN

## 2010-11-05 ENCOUNTER — Telehealth: Payer: Self-pay | Admitting: *Deleted

## 2010-11-05 NOTE — Telephone Encounter (Signed)
She is released TODAY to work an unrestricted schedule

## 2010-11-05 NOTE — Telephone Encounter (Signed)
Patient requesting to know when MD is going to release her to work 8 hours. Her work needs to know.

## 2010-11-05 NOTE — Telephone Encounter (Signed)
Patient left messg requesting lab results. Per system results are not resulted from [facility] yet. Will call them to obtain them

## 2010-11-06 NOTE — Telephone Encounter (Signed)
Called and received fax from [place] [facility] (allergy testing results). Called lmovm for patient to call back

## 2010-11-09 NOTE — Telephone Encounter (Signed)
LMOVM for pt to call back. Allergy testing shows allegies to milk.

## 2010-11-12 ENCOUNTER — Telehealth: Payer: Self-pay | Admitting: *Deleted

## 2010-11-12 NOTE — Telephone Encounter (Signed)
Closing phone note due to no return call back from patient

## 2010-11-12 NOTE — Telephone Encounter (Signed)
Pt left vm stating she had been talking to [place] and would like a call back regarding results and to continue discussion about returning to work.

## 2010-11-13 NOTE — Telephone Encounter (Signed)
Returned call to patient//LMOVM to call back 

## 2010-11-16 ENCOUNTER — Telehealth: Payer: Self-pay | Admitting: *Deleted

## 2010-11-16 NOTE — Telephone Encounter (Signed)
My recommendation is that she work a full and unrestricted schedule

## 2010-11-16 NOTE — Telephone Encounter (Signed)
No return call from patient, will mail out a latter

## 2010-11-16 NOTE — Telephone Encounter (Signed)
Pt states she will not be able to work more than 6 hour days. Would you provide note for pt's job for this? Or does pt need f/u ov?

## 2010-11-17 ENCOUNTER — Telehealth: Payer: Self-pay | Admitting: *Deleted

## 2010-11-17 NOTE — Telephone Encounter (Signed)
error 

## 2010-11-17 NOTE — Telephone Encounter (Signed)
Pt left another mess today req call back about returning to work.

## 2010-11-19 NOTE — Telephone Encounter (Signed)
Left detailed vm on pt's home # with MD's recommendations and to schedule OV for further discussion

## 2010-11-26 ENCOUNTER — Other Ambulatory Visit (INDEPENDENT_AMBULATORY_CARE_PROVIDER_SITE_OTHER): Payer: 59

## 2010-11-26 ENCOUNTER — Encounter: Payer: Self-pay | Admitting: Internal Medicine

## 2010-11-26 ENCOUNTER — Ambulatory Visit (INDEPENDENT_AMBULATORY_CARE_PROVIDER_SITE_OTHER): Payer: 59 | Admitting: Internal Medicine

## 2010-11-26 DIAGNOSIS — R42 Dizziness and giddiness: Secondary | ICD-10-CM | POA: Insufficient documentation

## 2010-11-26 DIAGNOSIS — K529 Noninfective gastroenteritis and colitis, unspecified: Secondary | ICD-10-CM | POA: Insufficient documentation

## 2010-11-26 DIAGNOSIS — R7309 Other abnormal glucose: Secondary | ICD-10-CM

## 2010-11-26 DIAGNOSIS — R1084 Generalized abdominal pain: Secondary | ICD-10-CM

## 2010-11-26 DIAGNOSIS — K5289 Other specified noninfective gastroenteritis and colitis: Secondary | ICD-10-CM

## 2010-11-26 LAB — BASIC METABOLIC PANEL
BUN: 10 mg/dL (ref 6–23)
CO2: 32 mEq/L (ref 19–32)
GFR: 112.09 mL/min (ref >60.00)
Glucose, Bld: 98 mg/dL (ref 70–99)
Potassium: 3.8 mEq/L (ref 3.5–5.1)

## 2010-11-26 LAB — URINALYSIS, ROUTINE W REFLEX MICROSCOPIC
Ketones, ur: NEGATIVE
Leukocytes, UA: NEGATIVE
Nitrite: NEGATIVE
Urobilinogen, UA: 0.2 (ref 0.0–1.0)
pH: 6 (ref 5.0–8.0)

## 2010-11-26 LAB — CBC WITH DIFFERENTIAL/PLATELET
Basophils Relative: 1.4 % (ref 0.0–3.0)
Eosinophils Relative: 1.7 % (ref 0.0–5.0)
HCT: 33.2 % — ABNORMAL LOW (ref 36.0–46.0)
Lymphs Abs: 2.7 10*3/uL (ref 0.7–4.0)
MCV: 83.2 fl (ref 78.0–100.0)
Monocytes Absolute: 0.4 10*3/uL (ref 0.1–1.0)
Monocytes Relative: 4.9 % (ref 3.0–12.0)
Neutrophils Relative %: 56.4 % (ref 43.0–77.0)
RBC: 3.99 Mil/uL (ref 3.87–5.11)
WBC: 7.5 10*3/uL (ref 4.5–10.5)

## 2010-11-26 LAB — HEPATIC FUNCTION PANEL
AST: 14 U/L (ref 0–37)
Albumin: 3.5 g/dL (ref 3.5–5.2)
Alkaline Phosphatase: 64 U/L (ref 39–117)
Total Protein: 7.5 g/dL (ref 6.0–8.3)

## 2010-11-26 MED ORDER — ONDANSETRON HCL 4 MG PO TABS: 4.0000 mg | ORAL_TABLET | Freq: Three times a day (TID) | ORAL | Status: DC | PRN

## 2010-11-26 MED ORDER — DIPHENOXYLATE-ATROPINE 2.5-0.025 MG PO TABS: 1.0000 | ORAL_TABLET | Freq: Four times a day (QID) | ORAL | Status: DC | PRN

## 2010-11-26 NOTE — Progress Notes (Signed)
Quick Note:  Voice message left on [employer] system - lab is negative, normal or otherwise stable, pt to continue same tx ______ 

## 2010-11-26 NOTE — Assessment & Plan Note (Signed)
With fatigue, lightheaded, general weakness and ongoing menorrhagia - also for cbc

## 2010-11-26 NOTE — Assessment & Plan Note (Signed)
Mild to mod, for symptomatic meds, off work note, push more fluids, tylenol prn

## 2010-11-26 NOTE — Patient Instructions (Signed)
Take all new medications as prescribed Continue all other medications as before Please drink plenty of fluids Please go to LAB in the Basement for the blood and/or urine tests to be done today Please call the number on the Blue [employer] (the [facility] System) for results of testing in 2-3 days

## 2010-11-26 NOTE — Assessment & Plan Note (Signed)
Mild, most likely related to the AGE, but will check labs per pt request

## 2010-11-26 NOTE — Assessment & Plan Note (Signed)
stable overall by hx and exam, most recent lab reviewed with pt, and pt to continue medical treatment as before  Lab Results  Component Value Date   HGBA1C 6.1 10/22/2010

## 2010-11-26 NOTE — Progress Notes (Signed)
Subjective:    Patient ID: Jessica Davenport, female    DOB: 25-May-1966, 45 y.o.   MRN: [id number]  HPI  Here with acute onset 7 days mild symptoms of low grade temp, malaise, fatigue, crampy abd pains, gas , bloating, regurgitation, dizziness , and recurrent loose stools, without blood. Still with ongoing menorrhagia, with possbily increased fatigue recently.  Pt denies chest pain, increased sob or doe, wheezing, orthopnea, PND, increased LE swelling, palpitations, or syncope.  Pt denies new neurological symptoms such as new headache, or facial or extremity weakness or numbness   Pt denies polydipsia, polyuria.   Past Medical History  Diagnosis Date  . Allergy   . Anemia   . Lumbar disc disease   . Vitamin D deficiency   . Ostium secundum 03/27/04    S/P ASD repair/Amplatzer device   Past Surgical History  Procedure Date  . Tubal ligation 1996    reports that she has never smoked. She does not have any smokeless tobacco history on file. She reports that she does not drink alcohol or use illicit drugs. family history includes Anxiety disorder in her sister; Cancer in her mother; Coronary artery disease in her father and mother; Diabetes in her father and mother; and Hypertension in her father and mother. No Known Allergies Current Outpatient Prescriptions on File Prior to Visit  Medication Sig Dispense Refill  . aspirin (ECOTRIN) 325 MG EC tablet Take 325 mg by mouth daily.        . Cholecalciferol ([employer]S VITAMIN D) 2000 UNITS CAPS Take 1 capsule by mouth daily.        . Ferrous Sulfate (IRON) 325 (65 FE) MG TABS Take 1 tablet by mouth 3 (three) times daily as needed.        . furosemide (LASIX) 20 MG tablet Take 1 tablet (20 mg total) by mouth daily.  90 tablet  3  . hydrOXYzine (ATARAX) 25 MG tablet Take 25 mg by mouth 4 (four) times daily as needed. Take 1-2 tablets by mouth QID as needed for itching/hives       . Prenatal Multivit-Min-Fe-FA (PRENATAL 1 + IRON PO) Take 1 tablet by mouth  daily.        . sertraline (ZOLOFT) 50 MG tablet Take 50 mg by mouth daily.        Marland Kitchen zolpidem (AMBIEN) 10 MG tablet Take 10 mg by mouth at bedtime as needed.        Marland Kitchen DISCONTD: methylPREDNIsolone (MEDROL DOSPACK) 4 MG tablet Take 1 tablet (4 mg total) by mouth daily. follow package directions  21 tablet  0   Review of Systems All otherwise neg per pt     Objective:   Physical Exam BP 110/74  Pulse 80  Temp(Src) 98 F (36.7 C) (Oral)  Ht 5\' 7"  (1.702 m)  Wt 298 lb 2 oz (135.229 kg)  BMI 46.69 kg/m2  SpO2 99%  LMP 11/17/2010 Physical Exam  VS noted, mild ill, fatigued appearing Constitutional: Pt appears well-developed and well-nourished.  HENT: Head: Normocephalic.  Right Ear: External ear normal.  Left Ear: External ear normal.  Bilat tm's mild erythema.  Sinus nontender.  Pharynx mild erythema Eyes: Conjunctivae and EOM are normal. Pupils are equal, round, and reactive to light.  Neck: Normal range of motion. Neck supple.  Cardiovascular: Normal rate and regular rhythm.   Pulmonary/Chest: Effort normal and breath sounds normal.  Abd:  Soft, non-distended, + BS, midl diffuse tender Neurological: Pt is alert. No cranial nerve  deficit.  Skin: Skin is warm. No erythema.  Psychiatric: Pt behavior is normal. Thought content normal.         Assessment & Plan:

## 2010-12-01 NOTE — Assessment & Plan Note (Signed)
[facility]                                 ON-CALL NOTE   NAME:JEFFERSShamir, Sedlar                     MRN:          [id number]  DATE:06/14/2007                            DOB:          08-20-1965    PHONE NUMBER:  [phone].   SUBJECTIVE:  Pain in abdomen, periumbilical.  Time starting at 8 p.m.  this night and going on for several hours now.  Nausea.  No vomiting.  No diarrhea.  No fever.  She has had pain like this once last year, but  she does not remember what it was.  The pain is 10/10 on a pain scale  and is difficult to bear.   ASSESSMENT/PLAN:  Abdominal pain in a 45 year old female, multiple  possible etiologies.  Given a 10/10 on a pain scale.  Recommended being  seen at [facility] emergency.     Kerby Nora, MD  Electronically Signed    AB/MedQ  DD: 06/14/2007  DT: 06/15/2007  Job #: [id number]

## 2010-12-01 NOTE — Assessment & Plan Note (Signed)
[facility]                            CARDIOLOGY OFFICE NOTE   NAME:Finkbiner, EUSTOLIA DRENNEN                    MRN:          [id number]  DATE:08/09/2008                            DOB:          [birth year]    Ms. Smaltz comes in today for followup.  She offers no complaints.  Her  edema has been under good control and she rarely takes her furosemide.   She denies any tachy palpitations, presyncope, major dyspnea on  exertion.   PROBLEM LIST:  She is in atrial septal defect, ostium secundum, with a  large left-to-right shunt associated with right-sided enlargement and  moderate pulmonary hypertension.  She is status post Amplatzer closure  at [facility] March 27, 2004.  Followup echo last obtained in September  2006, which showed decrease in right-sided chambers, a well-seated  atrial septal closure device, paradoxical septal motion, EF 60%, and no  significant elevation of pulmonary pressures.   Her meds today, enteric-coated aspirin 81 mg a day and iron.  She takes  furosemide 20 mg p.r.n..   PHYSICAL EXAMINATION:  VITAL SIGNS:  Blood pressure is 116 over 77.  Her  pulse is 75 and regular.  Weight is 254.  HEENT:  Essentially normal.  NECK:  No JVD.  There are no V-waves.  Carotid upstrokes are equal  bilaterally without bruits, no JVD.  No thyroid enlargement.  Trachea is  midline.  LUNGS:  Clear to auscultation and percussion.  HEART:  Nondisplaced PMI.  Normal S1 and S2.  There is no right  ventricular lift.  Soft systolic murmur was heard at the apex.  ABDOMEN:  Soft, good bowel sounds.  No midline bruit.  No pulsatile  mass.  No organomegaly.  EXTREMITIES:  No cyanosis, clubbing, or edema.  Pulses are intact.  NEURO:  Intact.   EKG shows sinus rhythm with an incomplete right bundle which is old.   ASSESSMENT AND PLAN:  Ms. Ribera is doing well.  We do need to repeat an  echocardiogram for objective assessment of her right-sided function and  pressures.  We will also need to check to make sure the septal closure  device is intact.   Otherwise, we will see her back again a year.     Thomas C. Daleen Squibb, MD, [facility]  Electronically Signed    TCW/MedQ  DD: 08/09/2008  DT: 08/09/2008  Job #: [id number]

## 2010-12-03 ENCOUNTER — Telehealth: Payer: Self-pay

## 2010-12-03 NOTE — Telephone Encounter (Signed)
Pt called about 2nd concern - She is taking meds as prescribed - Starting at 4 am pt c/o red, itchy hives on left am spreading to right side. Please advise.

## 2010-12-03 NOTE — Telephone Encounter (Signed)
Ok to cancel the allergy to zofran

## 2010-12-03 NOTE — Telephone Encounter (Signed)
Pt states that rash is related to known allergy to dairy that she accidentally had. Pt denies sob/wheezing or swollen tongue. She also says that she no longer has diarrhea and not taken Lomotil x 4 days. Pt says she was Rxd a medication to treat hives by Dr Yetta Barre and she will start that along with Benadryl. Letter requested had been generated and placed in cabinet for pt pick up.

## 2010-12-03 NOTE — Telephone Encounter (Signed)
Rash most likely due to zofran (the one for nausea)  Stop zofran I will add to allergy list Benadryl 50 mg po q 6 hrs prn itch/rash To urgent care or Er if rash worse, tongue swelling or wheezing/sob I would stop the lomotil as well, ok to take immodium prn   Ok for note to go back to work as pt requests, as the rash should improve with above

## 2010-12-03 NOTE — Telephone Encounter (Signed)
Pt called stating that she was taken out of work by Dr Jonny Ruiz Friday May 11th. Pt is requesting a letter from MD stating she is okay to return to work for 8 hrs a day. Okay to generate?

## 2010-12-04 NOTE — Assessment & Plan Note (Signed)
[facility]                            CARDIOLOGY OFFICE NOTE   NAME:JEFFERS, ZIONAH CRISWELL                   MRN:          [id number]  DATE:08/11/2006                            DOB:          1965/12/03    Ms. Jessica Davenport comes today to establish with me as her cardiologist. She  has been a former patient of Dr. Andee Lineman before he moved to [place].   She has a history of an atrial septal defect, ostium secundum, with a  large left to right shunt associated with right atrial and right  ventricular enlargement with mild to moderate pulmonary hypertension.  She is status post Amplatzer closure at [facility] March 27, 2004.   A 2-D echocardiogram April 07, 2005 showed an EF of 60%, paradoxical  septal motion, atrial septal closure device nicely seated, no shunt,  right-sided structures looked mildly dilated, right ventricular function  was normal and she had mild mitral regurgitation and mild tricuspid  regurgitation but no major elevation in pulmonary pressures. She is  bothered intermittently by lower extremity edema treated with p.r.n.  Lasix.   She has lost about 25-30 pounds. She feels much better.   Her only medicine is aspirin 81 mg a day.   VITAL SIGNS:  Her blood pressure today is 116/64, pulse is 67 and she is  in normal sinus rhythm. She has got ST segment changes inferiorly and  also has an RSR prime in V1, V2 which is new. Her weight is 241.  HEENT:  Normocephalic, atraumatic. PERRLA. Extraocular movements intact.  Sclera clear. Facial symmetry is normal. Dentition is in disrepair.  Carotid upstrokes were equal bilaterally without bruits. No JVD. Thyroid  is not enlarged, trachea is midline.  LUNGS:  Clear.  HEART:  Reveals a regular rate and rhythm with a widely split S2. There  is no gallop. PMI was difficult to palpate. There was no obvious right  ventricular lift.  EXTREMITIES:  Reveal trace edema, pulses are intact.  NEUROLOGIC:   Intact.   Ms. Jessica Davenport is doing well. I have renewed her furosemide 20 mg p.o.  p.r.n. edema. I will see her back in a year. At that time, she will need  a 2-D echo.     Thomas C. Daleen Squibb, MD, [facility]  Electronically Signed    TCW/MedQ  DD: 08/11/2006  DT: 08/11/2006  Job #: [id number]   cc:   Kathreen Cosier, M.D.

## 2010-12-04 NOTE — Procedures (Signed)
[facility]  Patient:    PANHIA, KARL                    MRN: [id number] Proc. Date: 07/27/00 Adm. Date:  [id number] Attending:  Thyra Breed CC:         Alinda Deem, M.D.   Procedure Report  PROCEDURE:  Lumbar epidural steroid injection.  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  INTERVAL HISTORY:  The patient has noted a minimal response to her first injection but requests at least one more injection today.  She had no untoward effects.  PHYSICAL EXAMINATION:  VITAL SIGNS:  Blood pressure 124/58, heart rate 74, respiratory rate 18, O2 saturation 100%, pain level 6 out of 10.  MUSCULOSKELETAL/NEUROLOGIC:  The patient shows good healing from previous injection site.  Neurologic exam is grossly unchanged.  DESCRIPTION OF PROCEDURE:  After informed consent was obtained, the patient was placed in a sitting position and monitored.  Her back was prepped with Betadine x 3.  A skin wheal was raised at the L4-5 interspace.  A 20-gauge Tuohy needle was introduced in the lumbar epidural space and loss of resistance to preservative-free normal saline.  There was no CSF nor blood. Medrol 80 mg in 8 ml of preservative-free normal saline was gently injected. The needle was flushed with preservative-free normal saline and removed intact.  POSTPROCEDURE CONDITION:  Stable.  DISCHARGE INSTRUCTIONS: 1. Resume previous diet. 2. Limitation of activities per instruction sheet. 3. Continue on current medications. 4. The patient will go ahead and schedule for another epidural but if she    does not respond to this one, I advised her there is really no need to get    a third one.  In that event, she will go ahead and follow up with Dr.    Turner Daniels. DD:  07/27/00 TD:  07/27/00 Job: [id number]

## 2010-12-04 NOTE — Op Note (Signed)
NAME:  Jessica Davenport, BRACKNEY            ACCOUNT NO.:  [id number]   MEDICAL RECORD NO.:  [id number]          PATIENT TYPE:  AMB   LOCATION:  [facility]                           FACILITY:  [facility]   PHYSICIAN:  Kathreen Cosier, M.D.DATE OF BIRTH:  [birth year]   DATE OF PROCEDURE:  10/05/2006  DATE OF DISCHARGE:                               OPERATIVE REPORT   PREOPERATIVE DIAGNOSIS:  Dysfunctional uterine bleeding.   POSTOPERATIVE DIAGNOSIS:  Dysfunctional uterine bleeding, resulting in  severe anemia, hemoglobin 7.9.   Under general anesthesia, the patient's perineum and vagina prepped and  draped out after the straight catheter.  Bimanual exam revealed the  uterus to be top normal size.  Speculum placed in the vagina.  Cervix  injected with 10 mL 1% Xylocaine.  Cervix grasped at the anterior lip  with tenaculum.  Endocervix curetted.  The endometrial cavity sounded to  9 cm.  The cervical length was 4 cm; cavity length 5 cm.  Cervix dilated  to #25 Shawnie Pons.  Hysteroscope inserted.  Cavity normal except for polyp.  Sharp curettage was performed.  A large amount of tissue obtained.  NovaSure device was inserted, and the cavity width was 5 cm.  The cavity  integrity test was passed, and the endometrial cavity was ablated for 1  minute and 25-seconds at 138 watts.  The device was then removed, and  the hysteroscope reinserted.  Cavity was totally ablated, and fluid  deficit was 5 cm.  The patient tolerated the procedure well and was  taken to the recovery room in good condition.           ______________________________  Kathreen Cosier, M.D.     BAM/MEDQ  D:  10/05/2006  T:  10/05/2006  Job:  [id number]

## 2010-12-04 NOTE — Procedures (Signed)
Jessica [facility]en[facility]  Patient:    Jessica Davenport, Jessica Davenport                    MRN: [id number] Proc. Date: 07/07/00 Adm. Date:  [id number] Attending:  Thyra Breed CC:         Jessica Davenport, M.D.   Procedure Report  PROCEDURE:  Lumbar epidural steroid injection.  DIAGNOSIS:  Annular tears of L4-5, and 5-S1.  HISTORY OF PRESENT ILLNESS:  Jessica Davenport is a very pleasant 45 year old who is sent to [place] by Dr. Gean Davenport for a series of lumbar epidural steroid injections. She states that she was in her usual state of good health up until October of 1998 when she had a work related injury. She was initially treated at [facility] and then sent to Dr. Turner Davenport and treated conservatively with physical therapist and occupational therapy and slowly improved to the point that she was able to adjust to the level of discomfort she was suffering from. She does not find that medications were of much benefit early on. She was able to return to some level of function with gainful employment and switched from [employer] to [employer]. In May, she spontaneously noted on the onset of intensification of her pain which felt like a crampy type discomfort in her left lower extremity and back discomfort which she described as feeling as though she was ready to die it was so intense. She had numbness and weakness. She was treated conservatively with muscle relaxants and physical therapy. She underwent a MRI at [facility] which showed posterior annular rents at L4-5 and 5-S1. She underwent a nerve conduction studies on May 05, 2000 by Dr. Lennon Davenport which showed a S1 radiculopathy to the left lower extremity. She currently notes that her pain is made worse by standing and walking and improved by Celebrex mildly and hydrocodone. She has numbness and tingling to the left lower extremity along the posterolateral aspect of her calf, minimal weakness and no loss of bowel or bladder  control.  CURRENT MEDICATIONS:  Celebrex, hydrocodone, Claritin, Aleve, and iron.  ALLERGIES:  No known drug allergies.  FAMILY HISTORY:  Positive for diabetes, coronary artery disease, asthma, breast caner, hypertension, strokes and osteoarthritis.  ACTIVE MEDICAL PROBLEMS:  History of iron deficiency anemia, a heart murmur with a "hole in her heart". She has had an ultrasound done by Dr. Lucas Davenport. She does not know exactly what her diagnosis is but I suspect she has got either a small patent ______ or some related problem. She intermittently takes antibiotics to get her teeth worked on. She also has a history of tension headaches.  PAST SURGICAL HISTORY:  Significant for a bilateral tubal ligation.  SOCIAL HISTORY:  The patient is a nonsmoker, nondrinker. 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  REVIEW OF SYSTEMS:  GENERAL:  Negative.  HEAD:  Significant for tension headaches. EYES:  Significant for corrective lens and borderline intraocular hypertension. NOSE/MOUTH/THROAT:  Negative. EARS:  Negative. Currently she had a boil on her ear at one point. PULMONARY: Negative. CARDIOVASCULAR: Significant for the heart murmur. GI: Negative. GU: Negative. MUSCULOSKELETAL:  See HPI for pertinent positives. NEUROLOGIC: No history of seizure or stroke. See HPI for pertinent positives. HEMATOLOGIC: History of anemia on iron. ENDOCRINE: Negative. CUTANEOUS:  Negative. PSYCHIATRIC:  Negative. ALLERGY/IMMUNOLOGIC:  Negative.  PHYSICAL EXAMINATION:  VITAL SIGNS:  Blood pressure 129/75, heart rate 72, respiratory rate 18, O2 saturations 100% and pain level is 6/10.  GENERAL:  This is a pleasant, mildly obese female in no acute distress.  HEENT:  Head was normocephalic, atraumatic. Eyes, extraocular movements intact. Conjunctivae and sclerae clear. Nose patent nares. Oropharynx was free of lesions.  NECK:  Supple without lymphadenopathy. Carotids were 2+ and symmetric  without bruits.  CARDIAC:  Revealed a grade 2/6 systolic murmur with mild diastolic murmur over the precordium.  LUNGS:  Clear.  BREASTS/ABDOMINAL/PELVIC/RECTAL:  Not performed.  BACK:  Revealed accentuated lumbar lordosis with no increased pain on hyperextension of the back with mild increased pain on forward flexion. Straight leg raise signs were negative. Gait is intact.  EXTREMITIES:  No cyanosis, clubbing or edema with radial pulses and dorsalis pedis pulse were 2+ and symmetric.  NEUROLOGIC:  The patient was oriented x 4. Cranial nerves II-XII are grossly intact. Deep tendon reflexes were symmetric in the upper extremity, 2+ at the knees, 2+ at the right ankle, absent left ankle. Sensory was significant for attenuated pinprick over the posterolateral aspect of the left calf. Motor was 5/5 with symmetric bulk and tone. Coordination was grossly intact.  IMPRESSION: 1. Work related injury in October of 1998 with ongoing discomfort with    exacerbation in May with S1 radiculopathy and underlying annular rents    at L4-5 and 5-S1. 2. Murmur. The patient has been told she has a "hole in her heart". 3. Borderline glaucoma. 4. Tension headaches.  DISPOSITION:  I discussed the potential risks, benefits and limitations of a lumbar epidural steroid injection in detail with the patient as well as with her husband. Their questions were answered.  DESCRIPTION OF PROCEDURE:  After informed consent was obtained, the patient was placed in the sitting position and monitored. The patients back was prepped with Betadine x 3. A skin wheal was raised at the L4-5 interspace with 1 percent lidocaine. A 20 gauge Tuohy needle was introduced to the lumbar epidural space to loss of resistance to preservative free normal saline. There was no cerebrospinal fluid nor blood. 80 mg of Medrol and 8 ml of preservative free normal saline was gently injected. The needle was flushed with preservative free  normal saline and removed intact.  CONDITION POST PROCEDURE:  Stable.   DISCHARGE INSTRUCTIONS:  Resume previous diet. Limitations in activities per instruction sheet. Continue on current medications. Follow-up with me in 1-2 weeks for repeat injection. DD:  07/07/00 TD:  07/08/00 Job: [id number]

## 2010-12-10 ENCOUNTER — Encounter: Payer: Self-pay | Admitting: Internal Medicine

## 2010-12-16 ENCOUNTER — Other Ambulatory Visit: Payer: Self-pay | Admitting: Obstetrics

## 2010-12-16 ENCOUNTER — Encounter (HOSPITAL_COMMUNITY): Payer: 59

## 2010-12-16 LAB — BASIC METABOLIC PANEL
BUN: 10 mg/dL (ref 6–23)
Chloride: 103 mEq/L (ref 96–112)
Creatinine, Ser: 0.73 mg/dL (ref 0.4–1.2)

## 2010-12-16 LAB — CBC
HCT: 34.2 % — ABNORMAL LOW (ref 36.0–46.0)
Hemoglobin: 11 g/dL — ABNORMAL LOW (ref 12.0–15.0)
MCH: 26.7 pg (ref 26.0–34.0)
MCV: 83 fL (ref 78.0–100.0)
Platelets: 295 10*3/uL (ref 150–400)
RBC: 4.12 MIL/uL (ref 3.87–5.11)
WBC: 7.8 10*3/uL (ref 4.0–10.5)

## 2010-12-21 ENCOUNTER — Telehealth: Payer: Self-pay | Admitting: Cardiology

## 2010-12-21 NOTE — Telephone Encounter (Signed)
I spoke with pt and in light of pt's ASD repair will need Ampicillin 2 grams IV within 30 minutes before starting procedure. This is according to the Bacterial endocarditis card.  Pt will make anesthesia and her ob gyn aware. Pt has already told them she needs pre-op antibiotics. She is having an HTA procedure on 12/23/10. Mylo Red RN

## 2010-12-21 NOTE — Telephone Encounter (Signed)
Pt having surgery and needs pre meds called into [facility] on [address]

## 2010-12-23 ENCOUNTER — Ambulatory Visit (HOSPITAL_COMMUNITY)
Admission: RE | Admit: 2010-12-23 | Discharge: 2010-12-23 | Disposition: A | Discharge status: Home / Self Care | Payer: 59 | Source: Ambulatory Visit | Attending: Obstetrics | Admitting: Obstetrics

## 2010-12-23 ENCOUNTER — Other Ambulatory Visit: Payer: Self-pay | Admitting: Obstetrics

## 2010-12-23 DIAGNOSIS — Z01818 Encounter for other preprocedural examination: Secondary | ICD-10-CM | POA: Insufficient documentation

## 2010-12-23 DIAGNOSIS — Z01812 Encounter for preprocedural laboratory examination: Secondary | ICD-10-CM | POA: Insufficient documentation

## 2010-12-23 DIAGNOSIS — N949 Unspecified condition associated with female genital organs and menstrual cycle: Secondary | ICD-10-CM | POA: Insufficient documentation

## 2010-12-23 DIAGNOSIS — N938 Other specified abnormal uterine and vaginal bleeding: Secondary | ICD-10-CM | POA: Insufficient documentation

## 2010-12-23 DIAGNOSIS — N841 Polyp of cervix uteri: Secondary | ICD-10-CM | POA: Insufficient documentation

## 2010-12-26 ENCOUNTER — Inpatient Hospital Stay (HOSPITAL_COMMUNITY)
Admission: AD | Admit: 2010-12-26 | Discharge: 2010-12-26 | Disposition: A | Discharge status: Home / Self Care | Payer: 59 | Source: Ambulatory Visit | Attending: Obstetrics | Admitting: Obstetrics

## 2010-12-26 DIAGNOSIS — R109 Unspecified abdominal pain: Secondary | ICD-10-CM | POA: Insufficient documentation

## 2010-12-26 LAB — URINALYSIS, ROUTINE W REFLEX MICROSCOPIC
Glucose, UA: NEGATIVE mg/dL
Ketones, ur: 15 mg/dL — AB
Leukocytes, UA: NEGATIVE
Protein, ur: 30 mg/dL — AB

## 2010-12-26 LAB — URINE MICROSCOPIC-ADD ON

## 2010-12-30 NOTE — Op Note (Signed)
  NAME:  Prather, Loeta             ACCOUNT NO.:  [id number]  MEDICAL RECORD NO.:  [id number]  LOCATION:  [facility]                          FACILITY:  [facility]  PHYSICIAN:  Kathreen Cosier, M.D.DATE OF BIRTH:  12/01/1965  DATE OF PROCEDURE:  12/23/2010 DATE OF DISCHARGE:                              OPERATIVE REPORT   PREOPERATIVE DIAGNOSIS:  Dysfunctional uterine bleeding.  PROCEDURE:  Removal of endocervical polyp, hysteroscopy D and C, and HTA.  Under general anesthesia, the patient in lithotomy position, perineum and vagina were prepped and draped bladder emptied with straight catheter.  Bimanual exam revealed uterus to be top normal size, negative adnexa.  Speculum was placed in the vagina.  Cervix was grasped with tenaculum.  Cervix was injected with 10 mL of 1% Xylocaine.  A small endocervical polyp present.  This was removed with a curette. Endometrial cavity sounded 10 cm.  Cervix dilated #27 Shawnie Pons.  The hysteroscope inserted.  The were multiple adhesions in the endometrial cavity.  The hysteroscope removed.  Sharp curettage performed.  The HTA device was inserted and there was 2-minute warm, 10 minutes cautery, and 2-minute cool down.  The patient tolerated the procedure well, taken to recovery room in good condition.          ______________________________ Kathreen Cosier, M.D.     BAM/MEDQ  D:  12/23/2010  T:  12/24/2010  Job:  [id number]  Electronically Signed by Francoise Ceo M.D. on 12/30/2010 07:46:36 AM

## 2011-04-27 LAB — COMPREHENSIVE METABOLIC PANEL
AST: 13
Albumin: 3.5
Alkaline Phosphatase: 66
CO2: 29
Chloride: 101
GFR calc Af Amer: >60
GFR calc non Af Amer: >60
Potassium: 3.5
Total Bilirubin: 0.6

## 2011-04-27 LAB — DIFFERENTIAL
Basophils Absolute: 0
Basophils Relative: 0
Eosinophils Absolute: 0 — ABNORMAL LOW
Eosinophils Relative: 0
Monocytes Absolute: 0.5

## 2011-04-27 LAB — CBC
HCT: 37.1
Platelets: 326
RBC: 4.31
WBC: 9.8

## 2011-04-27 LAB — URINALYSIS, ROUTINE W REFLEX MICROSCOPIC
Bilirubin Urine: NEGATIVE
Glucose, UA: NEGATIVE
Hgb urine dipstick: NEGATIVE
Ketones, ur: NEGATIVE
Leukocytes, UA: NEGATIVE
pH: 8.5 — ABNORMAL HIGH

## 2011-04-27 LAB — AMYLASE: Amylase: 71

## 2011-04-27 LAB — URINE MICROSCOPIC-ADD ON

## 2011-05-04 ENCOUNTER — Telehealth: Payer: Self-pay | Admitting: *Deleted

## 2011-05-04 NOTE — Telephone Encounter (Signed)
Pt called and states she is having abd pain that started this morning. None of the Doctors had an open slot today. Pt advise to go to Urgent care or Er she wants to see Dr Yetta Barre. I advised pt that he is out of the office this afternoon but I could send a message back and see if he would work her in tomorrow. Please Advise thank you

## 2011-05-05 ENCOUNTER — Encounter: Payer: Self-pay | Admitting: Internal Medicine

## 2011-06-04 ENCOUNTER — Other Ambulatory Visit (INDEPENDENT_AMBULATORY_CARE_PROVIDER_SITE_OTHER): Payer: 59

## 2011-06-04 ENCOUNTER — Ambulatory Visit (INDEPENDENT_AMBULATORY_CARE_PROVIDER_SITE_OTHER): Payer: 59 | Admitting: Internal Medicine

## 2011-06-04 ENCOUNTER — Encounter: Payer: Self-pay | Admitting: Internal Medicine

## 2011-06-04 VITALS — BP 92/74 | HR 76 | Ht 67.0 in | Wt 284.6 lb

## 2011-06-04 DIAGNOSIS — R1011 Right upper quadrant pain: Secondary | ICD-10-CM

## 2011-06-04 LAB — CBC WITH DIFFERENTIAL/PLATELET
Basophils Relative: 0.4 % (ref 0.0–3.0)
Eosinophils Relative: 1.8 % (ref 0.0–5.0)
Lymphocytes Relative: 30.8 % (ref 12.0–46.0)
MCV: 85.4 fl (ref 78.0–100.0)
Monocytes Absolute: 0.3 10*3/uL (ref 0.1–1.0)
Neutrophils Relative %: 62.8 % (ref 43.0–77.0)
RBC: 4.36 Mil/uL (ref 3.87–5.11)
WBC: 7.6 10*3/uL (ref 4.5–10.5)

## 2011-06-04 LAB — AMYLASE: Amylase: 63 U/L (ref 27–131)

## 2011-06-04 LAB — LIPASE: Lipase: 25 U/L (ref 11.0–59.0)

## 2011-06-04 LAB — HEPATIC FUNCTION PANEL
ALT: 12 U/L (ref 0–35)
Total Bilirubin: 0.4 mg/dL (ref 0.3–1.2)

## 2011-06-04 MED ORDER — HYDROCODONE-ACETAMINOPHEN 5-500 MG PO TABS: ORAL_TABLET | ORAL | Status: DC

## 2011-06-04 MED ORDER — OMEPRAZOLE MAGNESIUM 20 MG PO TBEC: 20.0000 mg | DELAYED_RELEASE_TABLET | Freq: Every day | ORAL | Status: DC

## 2011-06-04 NOTE — Progress Notes (Signed)
Jessica Davenport November 12, 1965 MRN [id number]   History of Present Illness:  This is a 45 year old African American female with several episodes of severe upper abdominal pain associated with nausea and vomiting. The first attack occurred while she was driving, the second attack the pain radiated to the right scapula. She is now nauseated but the main pain has resolved. There has been no fever or jaundice. There is a positive family history of gallbladder disease in her mother and sister. She has been on a very bland low-fat diet. She has had increased symptoms of reflux. A CT scan of the abdomen in 2008 was done to rule out kidney stones. It was normal. Pertinent medical history includes high blood pressure, and ASD closure in 2005 at [facility]. She has a 60% ejection fraction.   Past Medical History  Diagnosis Date  . Allergy   . Anemia   . Lumbar disc disease   . Vitamin D deficiency   . 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 Pulmonary hypertension   . Morbid obesity   . Fibroid, uterine    Past Surgical History  Procedure Date  . Tubal ligation 1996  . Uterine ablation     reports that she has never smoked. She has never used smokeless tobacco. She reports that she does not drink alcohol or use illicit drugs. family history includes Anxiety disorder in her sister; Breast cancer in her mother; Coronary artery disease in her father and mother; Diabetes in her father and mother; and Hypertension in her father and mother. No Active Allergies      Review of Systems: Positive for recent onset of reflux. Denies dysphagia chest pain. Denies diarrhea or rectal bleeding  The remainder of the 10 point ROS is negative except as outlined in H&P   Physical Exam: General appearance  Well developed, in no distress. Obese Eyes- non icteric. HEENT nontraumatic, normocephalic. Mouth no lesions, tongue papillated, no cheilosis. Neck supple without adenopathy, thyroid not  enlarged, no carotid bruits, no JVD. Lungs Clear to auscultation bilaterally. Cor normal S1, normal S2, regular rhythm, no murmur,  quiet precordium. Abdomen: Large abdomen very soft with normoactive bowel sounds. Tenderness over right costal margin, liver not enlarged. Liver very tender. No ascites. Rectal: Not done. Extremities no pedal edema. Skin no lesions. Neurological alert and oriented x 3. Psychological normal mood and affect.  Assessment and Plan:  Problem #1 Several episodes of acute abdominal pain consistent with biliary colic. Differential diagnosis includes gastroesophageal reflux, irritable bowel syndrome or peptic ulcer disease. She has a strong family history of gallbladder disease. We will check her amylase, lipase and liver function tests today and order an upper abdominal ultrasound. If negative, we will pursue a HIDA scan. Patient will stay on low-fat diet. We will also give her Vicodin #10.   06/04/2011 Jessica Davenport

## 2011-06-04 NOTE — Patient Instructions (Addendum)
Your physician has requested that you go to the basement for the following lab work before leaving today: CBC, Hepatic Panel, Amylase, Lipase You have been scheduled for an abdominal ultrasound at [facility] (1st floor of hospital) on Tuesday 06/08/11 at 8:00. Please arrive 15 minutes prior to your appointment for registration. Make certain not to have anything to eat or drink 6 hours prior to your appointment. Should you need to reschedule your appointment, please contact radiology at [phone]. We have sent the following medications to your pharmacy for you to pick up at your convenience: Vicodin We have given you samples of Prilosec to take once daily. CC: Dr Yetta Barre

## 2011-06-07 ENCOUNTER — Telehealth: Payer: Self-pay | Admitting: *Deleted

## 2011-06-07 NOTE — Telephone Encounter (Signed)
Spoke with patient and gave her lab results as per Dr. Brodie 

## 2011-06-07 NOTE — Telephone Encounter (Signed)
Message copied by Daphine Deutscher on Mon Jun 07, 2011 10:37 AM ------      Message from: Hart Carwin      Created: Fri Jun 04, 2011 10:09 PM       Please call pt, all blood tests are normal

## 2011-06-08 ENCOUNTER — Ambulatory Visit (HOSPITAL_COMMUNITY)
Admission: RE | Admit: 2011-06-08 | Discharge: 2011-06-08 | Disposition: A | Discharge status: Home / Self Care | Payer: 59 | Source: Ambulatory Visit | Attending: Internal Medicine | Admitting: Internal Medicine

## 2011-06-08 DIAGNOSIS — R1011 Right upper quadrant pain: Secondary | ICD-10-CM | POA: Insufficient documentation

## 2011-06-08 NOTE — Progress Notes (Signed)
Patient advised of normal ultrasound. She has verbalized understanding and states that she will call back if she has continued pain.

## 2011-07-20 DIAGNOSIS — K56609 Unspecified intestinal obstruction, unspecified as to partial versus complete obstruction: Secondary | ICD-10-CM

## 2011-07-20 HISTORY — DX: Unspecified intestinal obstruction, unspecified as to partial versus complete obstruction: K56.609

## 2011-09-05 ENCOUNTER — Ambulatory Visit (INDEPENDENT_AMBULATORY_CARE_PROVIDER_SITE_OTHER): Payer: 59 | Admitting: Family Medicine

## 2011-09-05 VITALS — BP 134/81 | HR 92 | Temp 100.2°F | Resp 18 | Ht 67.0 in | Wt 272.0 lb

## 2011-09-05 DIAGNOSIS — R1033 Periumbilical pain: Secondary | ICD-10-CM

## 2011-09-05 DIAGNOSIS — R102 Pelvic and perineal pain: Secondary | ICD-10-CM

## 2011-09-05 DIAGNOSIS — R110 Nausea: Secondary | ICD-10-CM

## 2011-09-05 LAB — POCT CBC
Lymph, poc: 0.9 (ref 0.6–3.4)
MCHC: 30.8 g/dL — AB (ref 31.8–35.4)
MPV: 7.3 fL (ref 0–99.8)
POC Granulocyte: 7.9 — AB (ref 2–6.9)
POC LYMPH PERCENT: 9.8 %L — AB (ref 10–50)
POC MID %: 4.9 %M (ref 0–12)
Platelet Count, POC: 338 10*3/uL (ref 142–424)
RDW, POC: 15.2 %

## 2011-09-05 LAB — POCT UA - MICROSCOPIC ONLY
Casts, Ur, LPF, POC: NEGATIVE
Yeast, UA: NEGATIVE

## 2011-09-05 LAB — POCT URINALYSIS DIPSTICK
Nitrite, UA: NEGATIVE
pH, UA: 7.5

## 2011-09-05 MED ORDER — PROMETHAZINE HCL 25 MG/ML IJ SOLN
25.0000 mg | Freq: Four times a day (QID) | INTRAMUSCULAR | Status: DC | PRN
  Administered 2011-09-05: 25 mg via INTRAMUSCULAR

## 2011-09-05 MED ORDER — PROMETHAZINE HCL 25 MG PO TABS: 25.0000 mg | ORAL_TABLET | Freq: Three times a day (TID) | ORAL | Status: AC | PRN

## 2011-09-05 MED ORDER — HYDROCODONE-ACETAMINOPHEN 5-500 MG PO TABS: ORAL_TABLET | ORAL | Status: DC

## 2011-09-05 NOTE — Progress Notes (Signed)
Subjective:    Patient ID: Jessica Davenport, female    DOB: 03/06/1966, 46 y.o.   MRN: [id number]  HPI 46 yo female with sharp pain at belly buttom, goes to left.  Also sharp pain in suprapubic region.  Chills.  Mild temp right now, no fever earlier.  Started this morning during church.  Comes and goes, lasts 1-2 minutes.  Associated with nausea.  Diarrhea today.  Urinary frequency.  DEnies dysuria - "but I wouldn't notice my stomach hurt so much".  Has had chicken pie, potatoes, gravy today.  No help or worse.  Similar pains a few months ago.  Sees Dr. Juanda Chance for abdominal pain - has been worked up for GB disease before.     Review of Systems Negative except as per HPI     Objective:   Physical Exam  Constitutional: Vital signs are normal. She appears well-developed and well-nourished. She is active.  Cardiovascular: Normal rate, regular rhythm, normal heart sounds and normal pulses.   Pulmonary/Chest: Effort normal and breath sounds normal.  Abdominal: Soft. Normal appearance and bowel sounds are normal. She exhibits no distension and no mass. There is no hepatosplenomegaly. There is tenderness in the periumbilical area and suprapubic area. There is no rigidity, no rebound, no guarding, no CVA tenderness, no tenderness at McBurney's point and negative Murphy's sign. No hernia.  Neurological: She is alert.   Results for orders placed in visit on 09/05/11  POCT CBC      Component Value Range   WBC 9.3  4.6 - 10.2 (K/uL)   Lymph, poc 0.9  0.6 - 3.4    POC LYMPH PERCENT 9.8 (*) 10 - 50 (%L)   MID (cbc) 0.5  0 - 0.9    POC MID % 4.9  0 - 12 (%M)   POC Granulocyte 7.9 (*) 2 - 6.9    Granulocyte percent 85.3 (*) 37 - 80 (%G)   RBC 4.65  4.04 - 5.48 (M/uL)   Hemoglobin 11.9 (*) 12.2 - 16.2 (g/dL)   HCT, POC 69.6  29.5 - 47.9 (%)   MCV 83.1  80 - 97 (fL)   MCH, POC 25.6 (*) 27 - 31.2 (pg)   MCHC 30.8 (*) 31.8 - 35.4 (g/dL)   RDW, POC 28.4     Platelet Count, POC 338  142 - 424 (K/uL)     MPV 7.3  0 - 99.8 (fL)  POCT URINALYSIS DIPSTICK      Component Value Range   Color, UA yellow     Clarity, UA clear     Glucose, UA neg     Bilirubin, UA neg     Ketones, UA neg     Spec Grav, UA 1.015     Blood, UA neg     pH, UA 7.5     Protein, UA 30mg      Urobilinogen, UA 0.2     Nitrite, UA neg     Leukocytes, UA small (1+)    POCT UA - MICROSCOPIC ONLY      Component Value Range   WBC, Ur, HPF, POC 0-4     RBC, urine, microscopic 0-4     Bacteria, U Microscopic trace     Mucus, UA neg     Epithelial cells, urine per micros 5-10     Crystals, Ur, HPF, POC neg     Casts, Ur, LPF, POC neg     Yeast, UA neg  Assessment & Plan:  Abdominal pain, sharp, periumbilical.  Benign abdominal exam.  Normal labs.  H/O similar pain followed by Dr. Juanda Chance.  Phenergan here.  Vicodin, phenergan Rx.  Pt to call Dr. Regino Schultze office in AM.  RTC or eD if worsens or changes and unable to get to Dr. Juanda Chance.

## 2011-09-06 LAB — COMPREHENSIVE METABOLIC PANEL
ALT: 12 U/L (ref 0–35)
AST: 12 U/L (ref 0–37)
Albumin: 4 g/dL (ref 3.5–5.2)
Alkaline Phosphatase: 75 U/L (ref 39–117)
Calcium: 9.2 mg/dL (ref 8.4–10.5)
Chloride: 104 mEq/L (ref 96–112)
Creat: 0.72 mg/dL (ref 0.50–1.10)
Potassium: 4.2 mEq/L (ref 3.5–5.3)

## 2011-09-08 ENCOUNTER — Encounter: Payer: Self-pay | Admitting: *Deleted

## 2011-09-20 ENCOUNTER — Ambulatory Visit (INDEPENDENT_AMBULATORY_CARE_PROVIDER_SITE_OTHER): Payer: 59 | Admitting: Internal Medicine

## 2011-09-20 ENCOUNTER — Encounter: Payer: Self-pay | Admitting: Internal Medicine

## 2011-09-20 VITALS — BP 118/74 | HR 80 | Temp 98.5°F | Resp 16 | Ht 70.0 in | Wt 286.0 lb

## 2011-09-20 DIAGNOSIS — J029 Acute pharyngitis, unspecified: Secondary | ICD-10-CM

## 2011-09-20 MED ORDER — AZITHROMYCIN 500 MG PO TABS: 500.0000 mg | ORAL_TABLET | Freq: Every day | ORAL | Status: DC

## 2011-09-20 NOTE — Progress Notes (Signed)
  Subjective:    Patient ID: Jessica Davenport, female    DOB: January 29, 1966, 46 y.o.   MRN: [id number]  Sore Throat  This is a new problem. The current episode started in the past 7 days. The problem has been unchanged. Neither side of throat is experiencing more pain than the other. The maximum temperature recorded prior to her arrival was 100 - 100.9 F. The patient is experiencing no pain. Associated symptoms include a hoarse voice. Pertinent negatives include no abdominal pain, congestion, coughing, diarrhea, drooling, ear discharge, ear pain, headaches, plugged ear sensation, neck pain, shortness of breath, stridor, swollen glands, trouble swallowing or vomiting. She has had exposure to strep. She has had no exposure to mono. She has tried nothing for the symptoms.      Review of Systems  Constitutional: Negative for fever, chills, diaphoresis, activity change, appetite change, fatigue and unexpected weight change.  HENT: Positive for hoarse voice. Negative for ear pain, congestion, drooling, trouble swallowing, neck pain and ear discharge.   Eyes: Negative.   Respiratory: Negative for cough, chest tightness, shortness of breath, wheezing and stridor.   Cardiovascular: Negative for chest pain, palpitations and leg swelling.  Gastrointestinal: Negative for vomiting, abdominal pain and diarrhea.  Genitourinary: Negative.   Musculoskeletal: Negative for myalgias, back pain, joint swelling, arthralgias and gait problem.  Skin: Negative for color change, pallor, rash and wound.  Neurological: Negative for dizziness, tremors, seizures, syncope, facial asymmetry, speech difficulty, weakness, light-headedness, numbness and headaches.  Hematological: Negative for adenopathy. Does not bruise/bleed easily.  Psychiatric/Behavioral: Negative.        Objective:   Physical Exam  Vitals reviewed. Constitutional: She is oriented to person, place, and time. She appears well-developed and well-nourished.  No distress.  HENT:  Head: Normocephalic and atraumatic. No trismus in the jaw.  Right Ear: Hearing, tympanic membrane, external ear and ear canal normal.  Left Ear: Hearing, tympanic membrane, external ear and ear canal normal.  Nose: Nose normal. No mucosal edema or rhinorrhea. Right sinus exhibits no maxillary sinus tenderness and no frontal sinus tenderness. Left sinus exhibits no maxillary sinus tenderness and no frontal sinus tenderness.  Mouth/Throat: Mucous membranes are normal. Mucous membranes are not pale, not dry and not cyanotic. No oral lesions. No uvula swelling. Posterior oropharyngeal erythema present. No oropharyngeal exudate, posterior oropharyngeal edema or tonsillar abscesses.  Eyes: Conjunctivae are normal. Right eye exhibits no discharge. Left eye exhibits no discharge. No scleral icterus.  Neck: Normal range of motion. Neck supple. No JVD present. No tracheal deviation present. No thyromegaly present.  Cardiovascular: Normal rate, regular rhythm, normal heart sounds and intact distal pulses.  Exam reveals no gallop and no friction rub.   No murmur heard. Pulmonary/Chest: Effort normal and breath sounds normal. No stridor. No respiratory distress. She has no wheezes. She has no rales. She exhibits no tenderness.  Abdominal: Soft. Bowel sounds are normal. She exhibits no distension. There is no tenderness. There is no rebound and no guarding.  Musculoskeletal: Normal range of motion. She exhibits no edema and no tenderness.  Lymphadenopathy:    She has no cervical adenopathy.  Neurological: She is oriented to person, place, and time.  Skin: Skin is warm and dry. No rash noted. She is not diaphoretic. No erythema. No pallor.  Psychiatric: She has a normal mood and affect. Her behavior is normal. Judgment and thought content normal.          Assessment & Plan:

## 2011-09-20 NOTE — Assessment & Plan Note (Signed)
Start zpak for presumed strep infection

## 2011-09-20 NOTE — Patient Instructions (Signed)

## 2011-09-21 ENCOUNTER — Ambulatory Visit: Payer: 59 | Admitting: Internal Medicine

## 2011-09-23 ENCOUNTER — Telehealth: Payer: Self-pay | Admitting: *Deleted

## 2011-09-23 NOTE — Telephone Encounter (Signed)
3 days of zithromax 500 mg gives 10 days of antibiotic coverage. It is too early to call her persistent symptoms an antibiotic failure.  She may use otc cough syrup (robitussin DM), APAP 1000 mg tid for fever and aches, gargle of choice.   If she is wheezing and short of breath she will need to be seen.

## 2011-09-23 NOTE — Telephone Encounter (Signed)
Pt informed/transferred to scheduler.  

## 2011-09-23 NOTE — Telephone Encounter (Signed)
Was seen on Monday by Dr. Yetta Barre. He gave her Zithromax 500 mg # 3. Pt c/o fever,cough, sore throat, wheezing, HA bilat ear pain. She is req another rx. Please advise in PCP absence.

## 2011-09-24 ENCOUNTER — Encounter: Payer: Self-pay | Admitting: Internal Medicine

## 2011-09-24 ENCOUNTER — Encounter: Payer: Self-pay | Admitting: *Deleted

## 2011-09-24 ENCOUNTER — Ambulatory Visit (INDEPENDENT_AMBULATORY_CARE_PROVIDER_SITE_OTHER)
Admission: RE | Admit: 2011-09-24 | Discharge: 2011-09-24 | Disposition: A | Discharge status: Home / Self Care | Payer: 59 | Source: Ambulatory Visit | Attending: Internal Medicine | Admitting: Internal Medicine

## 2011-09-24 ENCOUNTER — Ambulatory Visit (INDEPENDENT_AMBULATORY_CARE_PROVIDER_SITE_OTHER): Payer: 59 | Admitting: Internal Medicine

## 2011-09-24 VITALS — BP 120/78 | HR 78 | Temp 99.2°F | Ht 67.0 in | Wt 285.1 lb

## 2011-09-24 DIAGNOSIS — R05 Cough: Secondary | ICD-10-CM

## 2011-09-24 DIAGNOSIS — R059 Cough, unspecified: Secondary | ICD-10-CM

## 2011-09-24 DIAGNOSIS — J9801 Acute bronchospasm: Secondary | ICD-10-CM

## 2011-09-24 MED ORDER — PREDNISONE (PAK) 10 MG PO TABS: 10.0000 mg | ORAL_TABLET | ORAL | Status: AC

## 2011-09-24 MED ORDER — HYDROCODONE-HOMATROPINE 5-1.5 MG/5ML PO SYRP: 5.0000 mL | ORAL_SOLUTION | Freq: Four times a day (QID) | ORAL | Status: DC | PRN

## 2011-09-24 NOTE — Patient Instructions (Signed)
It was good to see you today. Test(s) ordered today. Your results will be called to you after review (48-72hours after test completion). If any changes need to be made, you will be notified at that time. Pred taper x 6 days, prescription cough syrup for nighttime use - Your prescription(s) have been submitted to your pharmacy. Please take as directed and contact our office if you believe you are having problem(s) with the medication(s). If you develop worsening symptoms or fever, call and we can reconsider antibiotics, but it does not appear necessary to use additional antibiotics at this time. Work note provided Continue to use over-the-counter medications such as Afrin for nasal congestion or Tylenol/ibuprofen for aches, low-grade fever and discomfort

## 2011-09-24 NOTE — Progress Notes (Signed)
  Subjective:    Patient ID: Jessica Davenport, female    DOB: 09-13-1965, 46 y.o.   MRN: [id number]  HPI  complains of continued cold symptoms  Seen for same 5 days ago, unimproved with Z-Pak symptoms ongoing 2 weeks since onset: sore throat, hoarse, nasal congestion Now coughing (dry) and shortness of breath, low grade fever at night with "wheeze" when supine Fatigued but unable to sleep at night because of cough Unrelieved with over-the-counter cough medication  Past Medical History  Diagnosis Date  . Allergy   . Anemia   . Lumbar disc disease   . Vitamin d deficiency   . 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 Pulmonary hypertension   . Morbid obesity   . Fibroid, uterine     Review of Systems  Constitutional: Positive for fatigue. Negative for fever and unexpected weight change.  HENT: Positive for congestion. Negative for ear pain, facial swelling, rhinorrhea, neck pain, neck stiffness, postnasal drip and sinus pressure.   Respiratory: Positive for cough and shortness of breath.        Objective:   Physical Exam BP 120/78  Pulse 78  Temp(Src) 99.2 F (37.3 C) (Oral)  Ht 5\' 7"  (1.702 m)  Wt 285 lb 1.9 oz (129.33 kg)  BMI 44.66 kg/m2  SpO2 97%  LMP 09/09/2011 Constitutional: She is overweight, but appears well-developed and well-nourished. No distress.  HENT: Head: Normocephalic and atraumatic. Ears: B TMs ok, no erythema or effusion; Nose: Nose normal. Mouth/Throat: Oropharynx is mildly red, but clear and moist. No oropharyngeal exudate.  Eyes: Conjunctivae and EOM are normal. Pupils are equal, round, and reactive to light. No scleral icterus.  Neck: Normal range of motion. Neck supple. No JVD present. No thyromegaly present.  Cardiovascular: Normal rate, regular rhythm and normal heart sounds.  No murmur heard. No BLE edema. Pulmonary/Chest: Effort normal, soft end exp wheeze -no rhonchi. No respiratory distress Skin: Skin is warm and dry. No  rash noted. No erythema.  Psychiatric: She has a normal mood and affect. Her behavior is normal. Judgment and thought content normal.   Lab Results  Component Value Date   WBC 9.3 09/05/2011   HGB 11.9* 09/05/2011   HCT 38.6 09/05/2011   PLT 325.0 06/04/2011   GLUCOSE 119* 09/05/2011   ALT 12 09/05/2011   AST 12 09/05/2011   NA 138 09/05/2011   K 4.2 09/05/2011   CL 104 09/05/2011   CREATININE 0.72 09/05/2011   BUN 9 09/05/2011   CO2 23 09/05/2011   TSH 0.77 04/06/2010   HGBA1C 6.1 10/22/2010      Assessment & Plan:  Recent URI with pharyngitis New cough and bronchospasm, suspect reactive postviral  Afebrile, normal O2 - dry cough with scant sputum  Check chest x-ray Treat with prednisone taper x6 days and Hydromet No additional antibiotics unless abnormality on imaging Work note provided

## 2011-10-11 ENCOUNTER — Ambulatory Visit: Payer: 59 | Admitting: Internal Medicine

## 2011-10-11 DIAGNOSIS — Z0289 Encounter for other administrative examinations: Secondary | ICD-10-CM

## 2011-11-01 ENCOUNTER — Other Ambulatory Visit (HOSPITAL_COMMUNITY): Payer: Self-pay | Admitting: Obstetrics and Gynecology

## 2011-11-01 DIAGNOSIS — Z1231 Encounter for screening mammogram for malignant neoplasm of breast: Secondary | ICD-10-CM

## 2011-11-05 ENCOUNTER — Other Ambulatory Visit: Payer: Self-pay | Admitting: Cardiology

## 2011-11-19 ENCOUNTER — Other Ambulatory Visit: Payer: Self-pay | Admitting: Internal Medicine

## 2011-11-24 ENCOUNTER — Ambulatory Visit (HOSPITAL_COMMUNITY)
Admission: RE | Admit: 2011-11-24 | Discharge: 2011-11-24 | Disposition: A | Discharge status: Home / Self Care | Payer: 59 | Source: Ambulatory Visit | Attending: Obstetrics and Gynecology | Admitting: Obstetrics and Gynecology

## 2011-11-24 ENCOUNTER — Ambulatory Visit (HOSPITAL_COMMUNITY): Payer: 59

## 2011-11-24 DIAGNOSIS — Z1231 Encounter for screening mammogram for malignant neoplasm of breast: Secondary | ICD-10-CM | POA: Insufficient documentation

## 2011-12-01 ENCOUNTER — Encounter: Payer: Self-pay | Admitting: Cardiology

## 2011-12-01 ENCOUNTER — Ambulatory Visit (INDEPENDENT_AMBULATORY_CARE_PROVIDER_SITE_OTHER): Payer: 59 | Admitting: Cardiology

## 2011-12-01 VITALS — BP 120/86 | HR 88 | Ht 67.0 in | Wt 284.0 lb

## 2011-12-01 DIAGNOSIS — I2789 Other specified pulmonary heart diseases: Secondary | ICD-10-CM

## 2011-12-01 DIAGNOSIS — Q2111 Secundum atrial septal defect: Secondary | ICD-10-CM

## 2011-12-01 DIAGNOSIS — Q211 Atrial septal defect: Secondary | ICD-10-CM

## 2011-12-01 DIAGNOSIS — R9431 Abnormal electrocardiogram [ECG] [EKG]: Secondary | ICD-10-CM

## 2011-12-01 MED ORDER — FUROSEMIDE 20 MG PO TABS: 20.0000 mg | ORAL_TABLET | Freq: Every day | ORAL | Status: DC

## 2011-12-01 NOTE — Assessment & Plan Note (Signed)
Stable. No evidence of right ventricular strain.

## 2011-12-01 NOTE — Progress Notes (Signed)
Addended by: Lisabeth Devoid F on: 12/01/2011 05:22 PM   Modules accepted: Orders

## 2011-12-01 NOTE — Assessment & Plan Note (Signed)
Last echocardiogram showed no shunting. This is been consistently stable since her repair. Will not repeat it again since her exam is stable with a split S2 and her edema is minimal.

## 2011-12-01 NOTE — Assessment & Plan Note (Signed)
This is secondary to her morbid obesity. I urged her again to lose weight. She doesn't want to have swollen legs which may be motivator. No change in medications. Lasix renewed and potassium rich diet reviewed

## 2011-12-01 NOTE — Patient Instructions (Addendum)
Your physician recommends that you continue on your current medications as directed. Please refer to the Current Medication list given to you today.  Your physician wants you to follow-up in: 1 year. You will receive a reminder letter in the mail two months in advance. If you don't receive a letter, please call our office to schedule the follow-up appointment.  Your physician encouraged you to lose weight for better health.  Your physician recommended that you follow a potassium rich diet. Please see the list of foods below.  Food / Potassium (mg)  Apricots, dried,  cup / 378 mg   Apricots, raw, 1 cup halves / 401 mg   Avocado,  / 487 mg   Banana, 1 large / 487 mg   Beef, lean, round, 3 oz / 202 mg   Cantaloupe, 1 cup cubes / 427 mg   Dates, medjool, 5 whole / 835 mg   Ham, cured, 3 oz / 212 mg   Lentils, dried,  cup / 458 mg   Lima beans, frozen,  cup / 258 mg   Orange, 1 large / 333 mg   Orange juice, 1 cup / 443 mg   Peaches, dried,  cup / 398 mg   Peas, split, cooked,  cup / 355 mg   Potato, boiled, 1 medium / 515 mg   Prunes, dried, uncooked,  cup / 318 mg   Raisins,  cup / 309 mg   Salmon, pink, raw, 3 oz / 275 mg   Sardines, canned , 3 oz / 338 mg   Tomato, raw, 1 medium / 292 mg   Tomato juice, 6 oz / 417 mg   [place], 3 oz / 349 mg  Document Released: 07/05/2005 Document Revised: 03/17/2011 Document Reviewed: 11/18/2008 [facility] Patient Information 2012 [place], [place]. Basic Carbohydrate Counting Basic carbohydrate counting is a way to plan meals. It is done by counting the amount of carbohydrate in foods. Eating carbohydrates increases blood glucose (sugar) levels. People with diabetes use carbohydrate counting to help keep their blood glucose at a normal level.  Foods that have carbohydrates are starches (grains, beans, starchy vegetables) and sweets.  COUNTING CARBOHYDRATES IN FOODS The first step in counting carbohydrates is to learn how  many carbohydrate servings you should have in every meal. A dietitian can plan this for you. After learning the amount of carbohydrates to include in your meal plan, you can start to choose the carbohydrate-containing foods you want to eat.  There are 2 ways to identify the amount of carbohydrates in the foods you eat.  Read the Nutrition Facts panel on food labels. All you need are 2 pieces of information from the Nutrition Facts panel to count carbohydrates this way:   Serving size.   Total carbohydrate (in grams).  Decide how many servings you will be eating. If it is 1 serving, you will be eating the amount of carbohydrate listed on the panel. If you will be eating 2 servings, you will be eating double the amount of carbohydrate listed on the panel.   Learn serving sizes. A serving size of most carbohydrate-containing foods is about 15 g. Listed below are serving sizes of common carbohydrate-containing foods:   1 slice bread.    cup unsweetened, dry cereal.    cup hot cereal.   ? cup rice.    cup mashed potatoes.   ? cup pasta.   1 cup fresh fruit.    cup canned fruit.   1 cup milk (whole,  2%, or skim).    cup starchy vegetables (peas, corn, or potatoes).  Counting carbohydrates this way is similar to looking on the Nutrition Facts panel. Decide how many servings you will eat first. Multiply the number of servings you eat by 15 g. For example, if you have 2 cups of strawberries, you had 2 servings. That means you had 30 g of carbohydrate (2 servings x 15 g = 30 g). CALCULATING CARBOHYDRATES IN A MEAL Sample dinner  3 oz chicken breast.   ? cup brown rice.    cup corn.   1 cup fat-free milk.   1 cup strawberries with sugar-free whipped topping.  Carbohydrate calculation First, identify the foods that contain carbohydrate:  Rice.   Corn.   Milk.   Strawberries.  Calculate the number of servings eaten:  2 servings rice.   1 serving corn.   1 serving  milk.   1 serving strawberries.  Multiply the number of servings by 15 g:  2 servings rice x 15 g = 30 g.   1 serving corn x 15 g = 15 g.   1 serving milk x 15 g = 15 g.   1 serving strawberries x 15 g = 15 g.  Add the amounts to find the total carbohydrates eaten: 30 g + 15 g + 15 g + 15 g = 75 g carbohydrate eaten at dinner. Document Released: 07/05/2005 Document Revised: 06/24/2011 Document Reviewed: 05/21/2011 [facility] Patient Information 2012 [place], [place].

## 2011-12-01 NOTE — Progress Notes (Signed)
HPI  Ms Jessica Davenport comes in today for evaluation and management of her history of a secundum ASD repair, pulmonary hypertension, and morbid obesity.  She was overdue for this visit. She's run out of her Lasix.  She denies any palpitations or chest pain. She is not very active. Her weight has not decreased in fact is probably increased. She blames this on Zoloft. Her edema has been minimal. She denies any syncope or presyncope. Past Medical History  Diagnosis Date  . Allergy   . Anemia   . Lumbar disc disease   . Vitamin d deficiency   . 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 Pulmonary hypertension   . Morbid obesity   . Fibroid, uterine     Current Outpatient Prescriptions  Medication Sig Dispense Refill  . aspirin (ECOTRIN) 325 MG EC tablet Take 325 mg by mouth daily.        . cephALEXin (KEFLEX) 500 MG capsule as directed.      . Ferrous Sulfate (IRON) 325 (65 FE) MG TABS Take 1 tablet by mouth 3 (three) times daily as needed.        . furosemide (LASIX) 20 MG tablet Take 1 tablet (20 mg total) by mouth daily.  90 tablet  3  . Glucosamine HCl 1000 MG TABS Take by mouth daily.        Marland Kitchen omeprazole (PRILOSEC OTC) 20 MG tablet Take 1 tablet (20 mg total) by mouth daily.  10 tablet  0  . Prenatal Multivit-Min-Fe-FA (PRENATAL 1 + IRON PO) Take 1 tablet by mouth daily.        . traMADol (ULTRAM) 50 MG tablet as needed.      . zolpidem (AMBIEN) 10 MG tablet TAKE 1 TABLET BY MOUTH AT BEDTIME AS NEEDED FOR INSOMNIA  30 tablet  0    No Known Allergies  Family History  Problem Relation Age of Onset  . Coronary artery disease Mother   . Hypertension Mother   . Diabetes Mother   . Breast cancer Mother   . Coronary artery disease Father   . Hypertension Father   . Diabetes Father   . Anxiety disorder Sister     History   Social History  . Marital Status: Single    Spouse Name: N/A    Number of Children: 3  . Years of Education: N/A   Occupational History    . adult medicaid worker [employer]   Social History Main Topics  . Smoking status: Never Smoker   . Smokeless tobacco: Never Used  . Alcohol Use: No  . Drug Use: No  . Sexually Active: Yes    Birth Control/ Protection: Surgical   Other Topics Concern  . Not on file   Social History Narrative   No regular exercise    ROS ALL NEGATIVE EXCEPT THOSE NOTED IN HPI  PE  General Appearance: well developed, well nourished in no acute distress,morbidly obese HEENT: symmetrical face, PERRLA, good dentition  Neck: no JVD, thyromegaly, or adenopathy, trachea midline Chest: symmetric without deformity Cardiac: PMI non-displaced, RRR, normal S1, S2 splits, no gallop or murmur Lung: clear to ausculation and percussion Vascular: all pulses full without bruits  Abdominal: nondistended, nontender, good bowel sounds, no HSM, no bruits Extremities: no cyanosis, clubbing, minimal edema, no sign of DVT, no varicosities  Skin: normal color, no rashes Neuro: alert and oriented x 3, non-focal Pysch: normal affect  EKG Normal sinus rhythm, incomplete right bundle branch  block. BMET    Component Value Date/Time   NA 138 09/05/2011 1854   K 4.2 09/05/2011 1854   CL 104 09/05/2011 1854   CO2 23 09/05/2011 1854   GLUCOSE 119* 09/05/2011 1854   BUN 9 09/05/2011 1854   CREATININE 0.72 09/05/2011 1854   CREATININE 0.73 12/16/2010 0936   CALCIUM 9.2 09/05/2011 1854   GFRNONAA >60 12/16/2010 0936   GFRAA  Value: >60        The eGFR has been calculated using the MDRD equation. This calculation has not been validated in all clinical situations. eGFR's persistently <60 mL/min signify possible Chronic Kidney Disease. 12/16/2010 0936    Lipid Panel  No results found for this basename: chol, trig, hdl, cholhdl, vldl, ldlcalc    CBC    Component Value Date/Time   WBC 9.3 09/05/2011 1851   WBC 7.6 06/04/2011 1507   RBC 4.65 09/05/2011 1851   RBC 4.36 06/04/2011 1507   HGB 11.9* 09/05/2011 1851   HGB  12.0 06/04/2011 1507   HCT 38.6 09/05/2011 1851   HCT 37.2 06/04/2011 1507   PLT 325.0 06/04/2011 1507   MCV 83.1 09/05/2011 1851   MCV 85.4 06/04/2011 1507   MCH 25.6* 09/05/2011 1851   MCH 26.7 12/16/2010 0936   MCHC 30.8* 09/05/2011 1851   MCHC 32.3 06/04/2011 1507   RDW 15.6* 06/04/2011 1507   LYMPHSABS 2.3 06/04/2011 1507   MONOABS 0.3 06/04/2011 1507   EOSABS 0.1 06/04/2011 1507   BASOSABS 0.0 06/04/2011 1507

## 2011-12-03 ENCOUNTER — Other Ambulatory Visit: Payer: Self-pay | Admitting: Obstetrics and Gynecology

## 2011-12-06 ENCOUNTER — Encounter (HOSPITAL_COMMUNITY)
Admission: RE | Admit: 2011-12-06 | Discharge: 2011-12-06 | Disposition: A | Discharge status: Home / Self Care | Payer: 59 | Source: Ambulatory Visit | Attending: Obstetrics and Gynecology | Admitting: Obstetrics and Gynecology

## 2011-12-06 ENCOUNTER — Encounter (HOSPITAL_COMMUNITY): Payer: Self-pay

## 2011-12-06 LAB — BASIC METABOLIC PANEL
BUN: 10 mg/dL (ref 6–23)
CO2: 28 mEq/L (ref 19–32)
Calcium: 9.2 mg/dL (ref 8.4–10.5)
Creatinine, Ser: 0.67 mg/dL (ref 0.50–1.10)
Glucose, Bld: 95 mg/dL (ref 70–99)
Sodium: 138 mEq/L (ref 135–145)

## 2011-12-06 LAB — CBC
HCT: 35.3 % — ABNORMAL LOW (ref 36.0–46.0)
Hemoglobin: 11.2 g/dL — ABNORMAL LOW (ref 12.0–15.0)
MCH: 26.7 pg (ref 26.0–34.0)
MCHC: 31.7 g/dL (ref 30.0–36.0)
MCV: 84 fL (ref 78.0–100.0)
Platelets: 297 K/uL (ref 150–400)
RBC: 4.2 MIL/uL (ref 3.87–5.11)
RDW: 14.9 % (ref 11.5–15.5)
WBC: 8.3 K/uL (ref 4.0–10.5)

## 2011-12-06 MED ORDER — CEFAZOLIN SODIUM-DEXTROSE 2-3 GM-% IV SOLR
2.0000 g | INTRAVENOUS | Status: AC
  Administered 2011-12-07: 2 g via INTRAVENOUS
  Filled 2011-12-06: qty 50

## 2011-12-06 NOTE — Patient Instructions (Addendum)
Your procedure is scheduled on: Tuesday, May 21  Enter through the Main Entrance of [facility] at: 11:30 Pick up the phone at the desk and dial [phone] and inform us of your arrival.  Please call this number if you have any problems the morning of surgery: [phone]  Remember: Do not eat food after midnight: Monday Do not drink clear liquids after: 7:00am Take these medicines the morning of surgery with a SIP OF WATER:  Prilosec  Do not wear jewelry, make-up, or FINGER nail polish Do not wear lotions, powders, perfumes or deodorant. Do not shave 48 hours prior to surgery. Do not bring valuables to the hospital. Contacts, dentures or bridgework may not be worn into surgery.  Leave suitcase in the car. After Surgery it may be brought to your room. For patients being admitted to the hospital, checkout time is 11:00am the day of discharge.  Remember to use your hibiclens as instructed. Please shower with 1/2 bottle the evening before your surgery and the other 1/2 bottle the morning of surgery. Neck down avoiding private area.

## 2011-12-07 ENCOUNTER — Encounter (HOSPITAL_COMMUNITY): Payer: Self-pay | Admitting: Anesthesiology

## 2011-12-07 ENCOUNTER — Encounter (HOSPITAL_COMMUNITY)
Admission: RE | Disposition: A | Discharge status: Home / Self Care | Payer: Self-pay | Source: Ambulatory Visit | Attending: Obstetrics and Gynecology

## 2011-12-07 ENCOUNTER — Ambulatory Visit (HOSPITAL_COMMUNITY): Payer: 59 | Admitting: Anesthesiology

## 2011-12-07 ENCOUNTER — Encounter (HOSPITAL_COMMUNITY): Payer: Self-pay | Admitting: *Deleted

## 2011-12-07 ENCOUNTER — Ambulatory Visit (HOSPITAL_COMMUNITY)
Admission: RE | Admit: 2011-12-07 | Discharge: 2011-12-08 | Disposition: A | Discharge status: Home / Self Care | Payer: 59 | Source: Ambulatory Visit | Attending: Obstetrics and Gynecology | Admitting: Obstetrics and Gynecology

## 2011-12-07 DIAGNOSIS — Z01812 Encounter for preprocedural laboratory examination: Secondary | ICD-10-CM | POA: Insufficient documentation

## 2011-12-07 DIAGNOSIS — Z01818 Encounter for other preprocedural examination: Secondary | ICD-10-CM | POA: Insufficient documentation

## 2011-12-07 DIAGNOSIS — D259 Leiomyoma of uterus, unspecified: Secondary | ICD-10-CM | POA: Insufficient documentation

## 2011-12-07 DIAGNOSIS — N803 Endometriosis of pelvic peritoneum, unspecified: Secondary | ICD-10-CM | POA: Insufficient documentation

## 2011-12-07 DIAGNOSIS — N920 Excessive and frequent menstruation with regular cycle: Principal | ICD-10-CM | POA: Insufficient documentation

## 2011-12-07 DIAGNOSIS — N946 Dysmenorrhea, unspecified: Secondary | ICD-10-CM | POA: Insufficient documentation

## 2011-12-07 DIAGNOSIS — Z90710 Acquired absence of both cervix and uterus: Secondary | ICD-10-CM

## 2011-12-07 HISTORY — PX: ABDOMINAL HYSTERECTOMY: SHX81

## 2011-12-07 SURGERY — ROBOTIC ASSISTED TOTAL HYSTERECTOMY
Anesthesia: General | Site: Abdomen | Wound class: Clean Contaminated

## 2011-12-07 MED ORDER — HYDROMORPHONE HCL PF 1 MG/ML IJ SOLN
INTRAMUSCULAR | Status: DC | PRN
  Administered 2011-12-07 (×2): 1 mg via INTRAVENOUS

## 2011-12-07 MED ORDER — ACETAMINOPHEN 10 MG/ML IV SOLN
1000.0000 mg | Freq: Once | INTRAVENOUS | Status: AC
  Administered 2011-12-07: 1000 mg via INTRAVENOUS
  Filled 2011-12-07: qty 100

## 2011-12-07 MED ORDER — HYDROMORPHONE 0.3 MG/ML IV SOLN
INTRAVENOUS | Status: DC
  Administered 2011-12-07: 18:00:00 via INTRAVENOUS
  Administered 2011-12-07: 1.79 mg via INTRAVENOUS
  Administered 2011-12-08: 0.999 mg via INTRAVENOUS
  Administered 2011-12-08: 0.799 mg via INTRAVENOUS
  Filled 2011-12-07: qty 25

## 2011-12-07 MED ORDER — ONDANSETRON HCL 4 MG/2ML IJ SOLN
INTRAMUSCULAR | Status: DC | PRN
  Administered 2011-12-07: 4 mg via INTRAVENOUS

## 2011-12-07 MED ORDER — OXYCODONE-ACETAMINOPHEN 5-325 MG PO TABS
1.0000 | ORAL_TABLET | ORAL | Status: DC | PRN
  Administered 2011-12-08 (×2): 2 via ORAL
  Filled 2011-12-07 (×2): qty 2

## 2011-12-07 MED ORDER — LACTATED RINGERS IR SOLN
Status: DC | PRN
  Administered 2011-12-07: 3000 mL

## 2011-12-07 MED ORDER — LIDOCAINE HCL (CARDIAC) 20 MG/ML IV SOLN
INTRAVENOUS | Status: AC
  Filled 2011-12-07: qty 5

## 2011-12-07 MED ORDER — LIDOCAINE HCL (CARDIAC) 20 MG/ML IV SOLN
INTRAVENOUS | Status: DC | PRN
  Administered 2011-12-07: 100 mg via INTRAVENOUS

## 2011-12-07 MED ORDER — FUROSEMIDE 20 MG PO TABS
20.0000 mg | ORAL_TABLET | Freq: Every day | ORAL | Status: DC
  Administered 2011-12-08: 20 mg via ORAL
  Filled 2011-12-07 (×2): qty 1

## 2011-12-07 MED ORDER — LACTATED RINGERS IV SOLN
INTRAVENOUS | Status: DC
  Administered 2011-12-07 (×3): via INTRAVENOUS

## 2011-12-07 MED ORDER — KETOROLAC TROMETHAMINE 30 MG/ML IJ SOLN
15.0000 mg | Freq: Once | INTRAMUSCULAR | Status: AC | PRN
  Administered 2011-12-07: 15 mg via INTRAVENOUS

## 2011-12-07 MED ORDER — BUPIVACAINE HCL (PF) 0.25 % IJ SOLN
INTRAMUSCULAR | Status: AC
  Filled 2011-12-07: qty 30

## 2011-12-07 MED ORDER — GLYCOPYRROLATE 0.2 MG/ML IJ SOLN
INTRAMUSCULAR | Status: DC | PRN
  Administered 2011-12-07: 0.6 mg via INTRAVENOUS

## 2011-12-07 MED ORDER — ROCURONIUM BROMIDE 50 MG/5ML IV SOLN
INTRAVENOUS | Status: AC
  Filled 2011-12-07: qty 1

## 2011-12-07 MED ORDER — CEFAZOLIN SODIUM 1-5 GM-% IV SOLN
1.0000 g | Freq: Three times a day (TID) | INTRAVENOUS | Status: AC
  Administered 2011-12-07 – 2011-12-08 (×2): 1 g via INTRAVENOUS
  Filled 2011-12-07 (×2): qty 50

## 2011-12-07 MED ORDER — NEOSTIGMINE METHYLSULFATE 1 MG/ML IJ SOLN
INTRAMUSCULAR | Status: AC
  Filled 2011-12-07: qty 10

## 2011-12-07 MED ORDER — DEXAMETHASONE SODIUM PHOSPHATE 4 MG/ML IJ SOLN
INTRAMUSCULAR | Status: DC | PRN
  Administered 2011-12-07: 10 mg via INTRAVENOUS

## 2011-12-07 MED ORDER — HYDROMORPHONE HCL PF 1 MG/ML IJ SOLN
INTRAMUSCULAR | Status: AC
  Administered 2011-12-07: 0.5 mg via INTRAVENOUS
  Filled 2011-12-07: qty 1

## 2011-12-07 MED ORDER — IBUPROFEN 800 MG PO TABS
800.0000 mg | ORAL_TABLET | Freq: Three times a day (TID) | ORAL | Status: DC | PRN
  Administered 2011-12-08: 800 mg via ORAL
  Filled 2011-12-07: qty 1

## 2011-12-07 MED ORDER — OMEPRAZOLE MAGNESIUM 20 MG PO TBEC: 20.0000 mg | DELAYED_RELEASE_TABLET | Freq: Every day | ORAL | Status: DC

## 2011-12-07 MED ORDER — HYDROMORPHONE HCL PF 1 MG/ML IJ SOLN
INTRAMUSCULAR | Status: AC
  Filled 2011-12-07: qty 1

## 2011-12-07 MED ORDER — SODIUM CHLORIDE 0.9 % IJ SOLN: 9.0000 mL | INTRAMUSCULAR | Status: DC | PRN

## 2011-12-07 MED ORDER — MIDAZOLAM HCL 2 MG/2ML IJ SOLN
INTRAMUSCULAR | Status: AC
  Filled 2011-12-07: qty 2

## 2011-12-07 MED ORDER — DEXTROSE IN LACTATED RINGERS 5 % IV SOLN
INTRAVENOUS | Status: DC
  Administered 2011-12-08: 02:00:00 via INTRAVENOUS

## 2011-12-07 MED ORDER — DIPHENHYDRAMINE HCL 50 MG/ML IJ SOLN
12.5000 mg | Freq: Four times a day (QID) | INTRAMUSCULAR | Status: DC | PRN
  Administered 2011-12-07: 12.5 mg via INTRAVENOUS
  Filled 2011-12-07: qty 1

## 2011-12-07 MED ORDER — HYDROMORPHONE HCL PF 1 MG/ML IJ SOLN: 0.2000 mg | INTRAMUSCULAR | Status: DC | PRN

## 2011-12-07 MED ORDER — BUPIVACAINE HCL (PF) 0.25 % IJ SOLN
INTRAMUSCULAR | Status: DC | PRN
  Administered 2011-12-07: 12 mL

## 2011-12-07 MED ORDER — KETOROLAC TROMETHAMINE 15 MG/ML IJ SOLN: 15.0000 mg | Freq: Once | INTRAMUSCULAR | Status: DC

## 2011-12-07 MED ORDER — FENTANYL CITRATE 0.05 MG/ML IJ SOLN
INTRAMUSCULAR | Status: AC
  Filled 2011-12-07: qty 2

## 2011-12-07 MED ORDER — MENTHOL 3 MG MT LOZG
1.0000 | LOZENGE | OROMUCOSAL | Status: DC | PRN
  Administered 2011-12-07: 3 mg via ORAL
  Filled 2011-12-07: qty 9

## 2011-12-07 MED ORDER — PROPOFOL 10 MG/ML IV EMUL
INTRAVENOUS | Status: AC
  Filled 2011-12-07: qty 20

## 2011-12-07 MED ORDER — PROMETHAZINE HCL 25 MG/ML IJ SOLN: 6.2500 mg | INTRAMUSCULAR | Status: DC | PRN

## 2011-12-07 MED ORDER — ARTIFICIAL TEARS OP OINT
TOPICAL_OINTMENT | OPHTHALMIC | Status: AC
  Filled 2011-12-07: qty 3.5

## 2011-12-07 MED ORDER — ONDANSETRON HCL 4 MG PO TABS: 4.0000 mg | ORAL_TABLET | Freq: Four times a day (QID) | ORAL | Status: DC | PRN

## 2011-12-07 MED ORDER — KETOROLAC TROMETHAMINE 30 MG/ML IJ SOLN
INTRAMUSCULAR | Status: AC
  Administered 2011-12-07: 15 mg via INTRAVENOUS
  Filled 2011-12-07: qty 1

## 2011-12-07 MED ORDER — ONDANSETRON HCL 4 MG/2ML IJ SOLN: 4.0000 mg | Freq: Four times a day (QID) | INTRAMUSCULAR | Status: DC | PRN

## 2011-12-07 MED ORDER — FENTANYL CITRATE 0.05 MG/ML IJ SOLN
INTRAMUSCULAR | Status: AC
  Filled 2011-12-07: qty 5

## 2011-12-07 MED ORDER — NALOXONE HCL 0.4 MG/ML IJ SOLN: 0.4000 mg | INTRAMUSCULAR | Status: DC | PRN

## 2011-12-07 MED ORDER — PANTOPRAZOLE SODIUM 40 MG PO TBEC
40.0000 mg | DELAYED_RELEASE_TABLET | Freq: Every day | ORAL | Status: DC
  Administered 2011-12-07 – 2011-12-08 (×2): 40 mg via ORAL
  Filled 2011-12-07 (×3): qty 1

## 2011-12-07 MED ORDER — PROPOFOL 10 MG/ML IV EMUL
INTRAVENOUS | Status: DC | PRN
  Administered 2011-12-07: 50 mg via INTRAVENOUS
  Administered 2011-12-07: 100 mg via INTRAVENOUS
  Administered 2011-12-07: 50 mg via INTRAVENOUS
  Administered 2011-12-07: 250 mg via INTRAVENOUS

## 2011-12-07 MED ORDER — MIDAZOLAM HCL 5 MG/5ML IJ SOLN
INTRAMUSCULAR | Status: DC | PRN
  Administered 2011-12-07: 2 mg via INTRAVENOUS

## 2011-12-07 MED ORDER — NEOSTIGMINE METHYLSULFATE 1 MG/ML IJ SOLN
INTRAMUSCULAR | Status: DC | PRN
  Administered 2011-12-07: 4 mg via INTRAVENOUS

## 2011-12-07 MED ORDER — GLYCOPYRROLATE 0.2 MG/ML IJ SOLN
INTRAMUSCULAR | Status: AC
  Filled 2011-12-07: qty 1

## 2011-12-07 MED ORDER — MEPERIDINE HCL 25 MG/ML IJ SOLN: 6.2500 mg | INTRAMUSCULAR | Status: DC | PRN

## 2011-12-07 MED ORDER — FENTANYL CITRATE 0.05 MG/ML IJ SOLN
INTRAMUSCULAR | Status: DC | PRN
  Administered 2011-12-07: 100 ug via INTRAVENOUS
  Administered 2011-12-07 (×2): 50 ug via INTRAVENOUS
  Administered 2011-12-07: 150 ug via INTRAVENOUS

## 2011-12-07 MED ORDER — HYDROMORPHONE HCL PF 1 MG/ML IJ SOLN
0.2500 mg | INTRAMUSCULAR | Status: DC | PRN
  Administered 2011-12-07 (×3): 0.5 mg via INTRAVENOUS

## 2011-12-07 MED ORDER — DIPHENHYDRAMINE HCL 12.5 MG/5ML PO ELIX: 12.5000 mg | ORAL_SOLUTION | Freq: Four times a day (QID) | ORAL | Status: DC | PRN

## 2011-12-07 MED ORDER — MIDAZOLAM HCL 2 MG/2ML IJ SOLN
INTRAMUSCULAR | Status: AC
  Administered 2011-12-07: 1 mg
  Filled 2011-12-07: qty 2

## 2011-12-07 MED ORDER — ROCURONIUM BROMIDE 100 MG/10ML IV SOLN
INTRAVENOUS | Status: DC | PRN
  Administered 2011-12-07: 20 mg via INTRAVENOUS
  Administered 2011-12-07: 10 mg via INTRAVENOUS
  Administered 2011-12-07: 50 mg via INTRAVENOUS
  Administered 2011-12-07: 10 mg via INTRAVENOUS

## 2011-12-07 MED ORDER — ONDANSETRON HCL 4 MG/2ML IJ SOLN
INTRAMUSCULAR | Status: AC
  Filled 2011-12-07: qty 2

## 2011-12-07 MED ORDER — ZOLPIDEM TARTRATE 5 MG PO TABS: 5.0000 mg | ORAL_TABLET | Freq: Every evening | ORAL | Status: DC | PRN

## 2011-12-07 SURGICAL SUPPLY — 67 items
ADH SKN CLS APL DERMABOND .7 (GAUZE/BANDAGES/DRESSINGS) ×2
BAG URINE DRAINAGE (UROLOGICAL SUPPLIES) ×3 IMPLANT
BARRIER ADHS 3X4 INTERCEED (GAUZE/BANDAGES/DRESSINGS) ×2 IMPLANT
BRR ADH 4X3 ABS CNTRL BYND (GAUZE/BANDAGES/DRESSINGS)
CABLE HIGH FREQUENCY MONO STRZ (ELECTRODE) ×3 IMPLANT
CATH FOLEY 3WAY  5CC 16FR (CATHETERS) ×1
CATH FOLEY 3WAY 5CC 16FR (CATHETERS) ×2 IMPLANT
CHLORAPREP W/TINT 26ML (MISCELLANEOUS) ×3 IMPLANT
CLOTH BEACON ORANGE TIMEOUT ST (SAFETY) ×3 IMPLANT
CONT PATH 16OZ SNAP LID 3702 (MISCELLANEOUS) ×3 IMPLANT
COVER MAYO STAND STRL (DRAPES) ×3 IMPLANT
COVER TABLE BACK 60X90 (DRAPES) ×6 IMPLANT
COVER TIP SHEARS 8 DVNC (MISCELLANEOUS) ×2 IMPLANT
COVER TIP SHEARS 8MM DA VINCI (MISCELLANEOUS) ×1
DECANTER SPIKE VIAL GLASS SM (MISCELLANEOUS) ×3 IMPLANT
DERMABOND ADVANCED (GAUZE/BANDAGES/DRESSINGS) ×1
DERMABOND ADVANCED .7 DNX12 (GAUZE/BANDAGES/DRESSINGS) ×2 IMPLANT
DRAPE HUG U DISPOSABLE (DRAPE) ×3 IMPLANT
DRAPE LG THREE QUARTER DISP (DRAPES) ×6 IMPLANT
DRAPE MONITOR DA VINCI (DRAPE) IMPLANT
DRAPE WARM FLUID 44X44 (DRAPE) ×3 IMPLANT
ELECT REM PT RETURN 9FT ADLT (ELECTROSURGICAL) ×3
ELECTRODE REM PT RTRN 9FT ADLT (ELECTROSURGICAL) ×2 IMPLANT
EVACUATOR SMOKE 8.L (FILTER) ×3 IMPLANT
GAUZE VASELINE 3X9 (GAUZE/BANDAGES/DRESSINGS) IMPLANT
GLOVE BIO SURGEON STRL SZ 6.5 (GLOVE) ×6 IMPLANT
GLOVE BIOGEL PI IND STRL 7.0 (GLOVE) ×6 IMPLANT
GLOVE BIOGEL PI INDICATOR 7.0 (GLOVE) ×3
GOWN STRL REIN XL XLG (GOWN DISPOSABLE) ×18 IMPLANT
HEMOSTAT SURGICEL 2X3 (HEMOSTASIS) ×1 IMPLANT
KIT ACCESSORY DA VINCI DISP (KITS) ×1
KIT ACCESSORY DVNC DISP (KITS) ×2 IMPLANT
KIT DISP ACCESSORY 4 ARM (KITS) IMPLANT
NDL INSUFFLATION 14GA 120MM (NEEDLE) ×2 IMPLANT
NEEDLE INSUFFLATION 14GA 120MM (NEEDLE) ×3 IMPLANT
OCCLUDER COLPOPNEUMO (BALLOONS) ×2 IMPLANT
PACK LAVH (CUSTOM PROCEDURE TRAY) ×3 IMPLANT
PAD PREP 24X48 CUFFED NSTRL (MISCELLANEOUS) ×6 IMPLANT
PLUG CATH AND CAP STER (CATHETERS) ×3 IMPLANT
PROTECTOR NERVE ULNAR (MISCELLANEOUS) ×6 IMPLANT
SCISSORS LAP 5X35 DISP (ENDOMECHANICALS) IMPLANT
SET CYSTO W/LG BORE CLAMP LF (SET/KITS/TRAYS/PACK) IMPLANT
SET IRRIG TUBING LAPAROSCOPIC (IRRIGATION / IRRIGATOR) ×3 IMPLANT
SOLUTION ELECTROLUBE (MISCELLANEOUS) ×3 IMPLANT
SPONGE LAP 18X18 X RAY DECT (DISPOSABLE) IMPLANT
SUT VIC AB 0 CT1 27 (SUTURE) ×21
SUT VIC AB 0 CT1 27XBRD ANTBC (SUTURE) ×10 IMPLANT
SUT VICRYL 0 UR6 27IN ABS (SUTURE) ×4 IMPLANT
SUT VICRYL 4-0 PS2 18IN ABS (SUTURE) ×6 IMPLANT
SYR 50ML LL SCALE MARK (SYRINGE) ×3 IMPLANT
SYRINGE 10CC LL (SYRINGE) ×3 IMPLANT
SYSTEM CONVERTIBLE TROCAR (TROCAR) IMPLANT
TIP UTERINE 5.1X6CM LAV DISP (MISCELLANEOUS) IMPLANT
TIP UTERINE 6.7X10CM GRN DISP (MISCELLANEOUS) ×1 IMPLANT
TIP UTERINE 6.7X6CM WHT DISP (MISCELLANEOUS) IMPLANT
TIP UTERINE 6.7X8CM BLUE DISP (MISCELLANEOUS) IMPLANT
TOWEL OR 17X24 6PK STRL BLUE (TOWEL DISPOSABLE) ×9 IMPLANT
TROCAR 12M 150ML BLUNT (TROCAR) IMPLANT
TROCAR 5M 150ML BLDLS (TROCAR) ×1 IMPLANT
TROCAR DISP BLADELESS 8 DVNC (TROCAR) IMPLANT
TROCAR DISP BLADELESS 8MM (TROCAR) ×1
TROCAR XCEL 12X100 BLDLESS (ENDOMECHANICALS) ×3 IMPLANT
TROCAR XCEL NON-BLD 5MMX100MML (ENDOMECHANICALS) ×3 IMPLANT
TROCAR Z-THREAD 12X150 (TROCAR) ×3 IMPLANT
TUBING FILTER THERMOFLATOR (ELECTROSURGICAL) ×3 IMPLANT
WARMER LAPAROSCOPE (MISCELLANEOUS) ×3 IMPLANT
WATER STERILE IRR 1000ML POUR (IV SOLUTION) ×9 IMPLANT

## 2011-12-07 NOTE — Anesthesia Postprocedure Evaluation (Signed)
Anesthesia Post Note  Patient: Jessica Davenport  Procedure(s) Performed: Procedure(s) (LRB): ROBOTIC ASSISTED TOTAL HYSTERECTOMY (N/A) BILATERAL SALPINGECTOMY (Bilateral)  Anesthesia type: General  Patient location: PACU  Post pain: Pain level controlled  Post assessment: Post-op Vital signs reviewed  Last Vitals:  Filed Vitals:   12/07/11 1156  BP: 140/73  Temp: 37 C  Resp: 16    Post vital signs: Reviewed  Level of consciousness: sedated  Complications: No apparent anesthesia complicationsfj

## 2011-12-07 NOTE — Anesthesia Preprocedure Evaluation (Signed)
Anesthesia Evaluation  Patient identified by MRN, date of birth, ID band Patient awake    Reviewed: Allergy & Precautions, H&P , NPO status , Patient's Chart, lab work & pertinent test results  Airway Mallampati: III TM Distance: >3 FB Neck ROM: full    Dental  (+) Teeth Intact, Partial Upper and Partial Lower   Pulmonary neg pulmonary ROS,    Pulmonary exam normal       Cardiovascular negative cardio ROS      Neuro/Psych negative neurological ROS     GI/Hepatic negative GI ROS, Neg liver ROS,   Endo/Other  Morbid obesity  Renal/GU negative Renal ROS  negative genitourinary   Musculoskeletal negative musculoskeletal ROS (+)   Abdominal (+) + obese,   Peds negative pediatric ROS (+)  Hematology negative hematology ROS (+)   Anesthesia Other Findings   Reproductive/Obstetrics negative OB ROS                           Anesthesia Physical Anesthesia Plan  ASA: III  Anesthesia Plan: General   Post-op Pain Management:    Induction: Intravenous  Airway Management Planned: Oral ETT  Additional Equipment:   Intra-op Plan:   Post-operative Plan: Extubation in OR  Informed Consent: I have reviewed the patients History and Physical, chart, labs and discussed the procedure including the risks, benefits and alternatives for the proposed anesthesia with the patient or authorized representative who has indicated his/her understanding and acceptance.   Dental Advisory Given  Plan Discussed with: CRNA and Surgeon  Anesthesia Plan Comments:         Anesthesia Quick Evaluation

## 2011-12-07 NOTE — Transfer of Care (Signed)
Immediate Anesthesia Transfer of Care Note  Patient: Jessica Davenport  Procedure(s) Performed: Procedure(s) (LRB): ROBOTIC ASSISTED TOTAL HYSTERECTOMY (N/A) BILATERAL SALPINGECTOMY (Bilateral)  Patient Location: PACU  Anesthesia Type: General  Level of Consciousness: awake, alert  and oriented  Airway & Oxygen Therapy: Patient Spontanous Breathing and Patient connected to nasal cannula oxygen  Post-op Assessment: Report given to PACU RN and Post -op Vital signs reviewed and stable  Post vital signs: stable  Complications: No apparent anesthesia complications

## 2011-12-07 NOTE — Brief Op Note (Signed)
12/07/2011  4:41 PM  PATIENT:  Jessica Davenport  46 y.o. female  PRE-OPERATIVE DIAGNOSIS:  Menometrorrhagia, Fibroids, dysmenorrhea  POST-OPERATIVE DIAGNOSIS:  Menometrorrhagia, fibroids, dysmenorrhea  PROCEDURE:  Procedure(s) (LRB):DAVINCI ROBOTIC ASSISTED TOTAL HYSTERECTOMY (N/A) BILATERAL SALPINGECTOMY (Bilateral)  SURGEON:  Surgeon(s) and Role:    * Briceyda Abdullah Cathie Beams, MD - Primary    * Robley Fries, MD - Assisting  PHYSICIAN ASSISTANT:   ASSISTANTS: VAISHALI MODY, MD   ANESTHESIA:   general FINDINGS: FIBROID UTERUS, NL TUBES, OVARIES, ALLEN-MASTERS WINDOW, URETERS PERISTALSING BILATERALLY EBL: 50 cc    Total I/O In: 1000 [I.V.:1000] Out: 600 [Urine:550; Blood:50]  BLOOD ADMINISTERED:none  DRAINS: none   LOCAL MEDICATIONS USED:  MARCAINE     SPECIMEN:  Source of Specimen:  UTERUS,CERVIX TUBE, PERITONEAL WINDOW  DISPOSITION OF SPECIMEN:  PATHOLOGY  COUNTS:  YES  TOURNIQUET:  * No tourniquets in log *  DICTATION: .Other Dictation: Dictation Number [id number]  PLAN OF CARE: Admit for overnight observation  PATIENT DISPOSITION:  PACU - hemodynamically stable.   Delay start of Pharmacological VTE agent (>24hrs) due to surgical blood loss or risk of bleeding: no

## 2011-12-08 LAB — BASIC METABOLIC PANEL
Calcium: 8.9 mg/dL (ref 8.4–10.5)
Creatinine, Ser: 0.67 mg/dL (ref 0.50–1.10)
GFR calc non Af Amer: >90 mL/min (ref >90)
Glucose, Bld: 120 mg/dL — ABNORMAL HIGH (ref 70–99)
Sodium: 136 mEq/L (ref 135–145)

## 2011-12-08 LAB — CBC
MCH: 26.6 pg (ref 26.0–34.0)
MCHC: 31.6 g/dL (ref 30.0–36.0)
MCV: 84.4 fL (ref 78.0–100.0)
Platelets: 307 10*3/uL (ref 150–400)

## 2011-12-08 MED ORDER — OXYCODONE-ACETAMINOPHEN 5-325 MG PO TABS: 1.0000 | ORAL_TABLET | ORAL | Status: AC | PRN

## 2011-12-08 MED ORDER — IBUPROFEN 800 MG PO TABS: 800.0000 mg | ORAL_TABLET | Freq: Three times a day (TID) | ORAL | Status: AC | PRN

## 2011-12-08 NOTE — Discharge Instructions (Signed)
Call if temperature greater than equal to 100.4, nothing per vagina for 4-6 weeks or severe nausea vomiting, increased incisional pain , drainage or redness in the incision site, no straining with bowel movements, showers no bath °

## 2011-12-08 NOTE — Progress Notes (Signed)
Pt d/c home  Teaching  Complete

## 2011-12-08 NOTE — Op Note (Signed)
NAME:  Coccia, Wen             ACCOUNT NO.:  [id number]  MEDICAL RECORD NO.:  [id number]  LOCATION:  9316                          FACILITY:  [facility]  PHYSICIAN:  Maxie Better, M.D.DATE OF BIRTH:  12/11/1965  DATE OF PROCEDURE: DATE OF DISCHARGE:                              OPERATIVE REPORT   PREOPERATIVE DIAGNOSES:  Menometrorrhagia, fibroid uterus, dysmenorrhea.  PROCEDURES:  Da Vinci robotic-assisted total hysterectomy, bilateral salpingectomy.  POSTOPERATIVE DIAGNOSES:  Menometrorrhagia, fibroid uterus, dysmenorrhea, question pelvic endometriosis.  ANESTHESIA:  General.  SURGEON:  Maxie Better, MD  ASSISTANT:  Darryl Nestle, MD  INDICATION:  This is a 46 year old gravida 3, para 3, divorced black female with a history of tubal ligation as well as endometrial ablation x2, who now presents for definitive surgical management of her menometrorrhagia and dysmenorrhea via robotic-assisted hysterectomy. Risks and benefits of the procedure have been explained to the patient. Consent was signed.  The patient was transferred to the operating room.  DESCRIPTION OF PROCEDURE:  Under adequate general anesthesia, the patient was placed in the dorsal lithotomy position.  She was positioned for robotic surgery.  The patient had received IV antibiotics as well as IV Tylenol.  The patient was ChloraPrep and Betadine prepped in the usual fashion.  Examination under anesthesia had revealed a 10-12 week size uterus.  No adnexal masses could be appreciated, but exam was limited due to the patient's body habitus.  The patient was also having her menstrual cycle.  Once the patient was sterilely prepped and draped, indwelling Foley catheter 3-way was sterilely placed.  Weighted speculum was placed in the vagina.  Sims retractor was placed anteriorly.  0 Vicryl figure-of-eight suture was placed on the anterior and posterior lip of the cervix.  The cervix sounded to 10 cm and was  easily dilated.  A #10 uterine manipulator was introduced into the uterine cavity without incident and a medium-size RUMI KOH cup was placed around the cervix and the cervicovaginal junction.  Once these were in place, attention was then turned to the abdomen.  The patient was obese.  0.25% Marcaine was injected at the supraumbilical site.  Veress needle was introduced. Opening pressure varied, but settled down at 8.  2.8 L of CO2 was insufflated.  Veress needle was then removed.  A 12-mm disposable trocar with sleeve was introduced in the abdomen without incident.  The robotic camera was then placed through that port confirming entry into the abdomen without incident.  Normal liver edge was noted.  The patient was slid down to the table and subsequently placed in deep Trendelenburg. The uterus appears slightly enlarged with a posterior fibroid, otherwise unremarkable initially.  Using 4 fingerbreadths away from the midline of the robotic port site on either side, the demarcation for two 8-mm robotic ports were placed after 0.25% Marcaine was injected on the skin. Two ports on the left, one port on the right, and then on the right lower quadrant, a 5-mm  Assistant port site was placed.  Once these were placed, the pelvis was further inspected and it was noted that there was an incidental puncture of the uterine manipulator to the left posterior aspect of the uterus without any active bleeding.  The ovaries were normal.  The tubes had evidence of prior tubal separation and there was a fairly large Freida Busman- Masters window in the right pelvic sidewall.  No other evidence of endometriosis was noted; however, the patient again was on her cycle.  Ureters were both identified in the pelvis and seen peristalsing.  Posterior cul-de-sac without any particular lesions.  The robot was docked to the patient's left side and the monopolar scissors placed in arm 1, the PK dissector was placed in arm 2, and  the Prograsp was also placed in third arm.  At that point, I went to the surgical console.  At the surgical console, the pelvis was further inspected including the site of the incidental puncture.  The procedure was began by cauterization of the left round ligament, which was serially clamped, cauterized, and cut.  The anterior leaf of the broad was partially opened.  The left fallopian tube was grasped.  The underlying mesosalpinx was serially grasped with PK dissector, cauterized and then cut, and this was carried up to the utero- ovarian ligament.  At that point, the utero-ovarian ligament was then serially clamped, cauterized, and then cut.  The uterine vessels were skeletonized and the anterior vesicouterine peritoneum was opened transversely.  The bladder was then bluntly and sharply dissected off the lower uterine segment, displaced inferiorly with bleeders being cauterized as well.  The uterine vessels once they were skeletonized and free, they were clamped, cauterized, but not cut.  Attention was then turned to the opposite side.  The Allen-Masters window was excised and sent separately for possible endometriosis.  The ureter was again identified peristalsing.  The right round ligament was clamped, cauterized, and then cut.  The right fallopian tube was serially clamped under the mesosalpinx, cauterized, and then cut as well followed by the right utero-ovarian ligament, which was then serially clamped, cauterized, and cut, and then carried down to the junction of where the bladder reflection, which was then further opened and the bladder was sharply dissected off the lower uterine segment.  Once the right uterine vessels were skeletonized, they were clamped, cauterized, and cut until it was felt satisfactory that the blood supply to the uterus had been severed.  Once that was done on the right, it was then performed on the contralateral side.  Once this was done and the bladder  was felt to be further inferior at the top portion of the RUMI cup, monopolar scissors were then used to make a circumferential incision to sever the uterus from its vaginal attachment.  This was done.  Bleeding on the vaginal cuff was cauterized.  The uterus was brought out through the vagina. Pieces of the fallopian tube, which was separated due to the tubal ligation was also brought out through the vagina.  Once hemostasis was achieved, the bladder was further dissected off the anterior vaginal cuff and then, the monopolar scissors and the PK dissector was removed and replaced by long tipped forceps and large needle driver.  0 Vicryl sutures was placed through the umbilical port site and the vaginal cuff was closed with interrupted figure-of-eight sutures of 0 Vicryl until it was closed completely and confirmed by the digital exam.  The vagina was irrigated and suctioned. Small bleeding was noted on the bladder peritoneal area, which was cauterized.  The abdomen was irrigated, suctioned.  This was felt to be complete, the robotic instruments were removed, the robot was undocked from its attachment.  The needles that was in the abdomen  were removed.  The abdomen was again irrigated.  Small bleeder on the anterior area of the bladder was cauterized with good hemostasis noted.  Surgicel was placed overlying the vaginal cuff.  The appendix was noted to be normal.  The liver edge has been normal.  The robotic port sites were then removed under direct visualization.  The abdomen was deflated.  The incisions were then closed with 4-0 Vicryl subcuticular stitch in the umbilical site after identifying the rectus fascia, which was closed with 0 Vicryl. The remaining other site was closed with 4-0 vicryl subcuticular stitches followed by Dermabond. The vagina cuff was reinspected.  Good approximation was noted.  At that point, the procedure was terminated.  SPECIMENS:  Uterus with cervix and  fallopian tubes sent to pathology as well as the peritoneal window.  INTRAOPERATIVE FLUID:  1 L.  URINE OUTPUT:  550 mL.  BLOOD LOSS:  50 mL.  Sponge and instrument counts x2 was correct.  COMPLICATIONS:  None.  The patient tolerated the procedure well, was transferred to recovery in stable condition.     Maxie Better, M.D.     [facility]  D:  12/07/2011  T:  12/08/2011  Job:  [id number]

## 2011-12-08 NOTE — Addendum Note (Signed)
Addendum  created 12/08/11 1151 by Shanon Payor, CRNA   Modules edited:Notes Section

## 2011-12-08 NOTE — Progress Notes (Signed)
Subjective: Patient reports tolerating PO and no problems voiding.    Objective: I have reviewed patient's vital signs.  vital signs and intake and output. Filed Vitals:   12/08/11 1020  BP: 145/68  Pulse: 66  Temp: 98.2 F (36.8 C)  Resp: 18   I/O last 3 completed shifts: In: 3741.9 [P.O.:480; I.V.:3211.9; IV Piggyback:50] Out: 2475 [Urine:2425; Blood:50] Total I/O In: -  Out: 350 [Urine:350]  Lab Results  Component Value Date   WBC 12.7* 12/08/2011   HGB 10.1* 12/08/2011   HCT 32.0* 12/08/2011   MCV 84.4 12/08/2011   PLT 307 12/08/2011   Lab Results  Component Value Date   CREATININE 0.67 12/08/2011    EXAM General: alert, cooperative and no distress Resp: clear to auscultation bilaterally Cardio: regular rate and rhythm, S1, S2 normal, no murmur, click, rub or gallop GI: soft, non-tender; bowel sounds normal; no masses,  no organomegaly and incision: clean, dry and intact Extremities: no edema, redness or tenderness in the calves or thighs Vaginal Bleeding: minimal Incision: supraumbilical site w/ some surrounding ecchymosis Assessment: s/p Procedure(s): DAVINCI ROBOTIC ASSISTED TOTAL HYSTERECTOMY BILATERAL SALPINGECTOMY: stable, progressing well and tolerating diet  Plan: Advance diet Encourage ambulation Discharge home d/c instructions reviewed. Percocet script given. F/u 2 wk  LOS: 1 day    Ha Shannahan A, MD 12/08/2011 10:48 AM    12/08/2011, 10:48 AM

## 2011-12-08 NOTE — Anesthesia Postprocedure Evaluation (Signed)
  Anesthesia Post-op Note  Patient: Jessica Davenport  Procedure(s) Performed: Procedure(s) (LRB): ROBOTIC ASSISTED TOTAL HYSTERECTOMY (N/A) BILATERAL SALPINGECTOMY (Bilateral)  Patient Location: [facility]  Anesthesia Type: General  Level of Consciousness: awake, alert  and oriented  Airway and Oxygen Therapy: Patient Spontanous Breathing  Post-op Pain: none  Post-op Assessment: Post-op Vital signs reviewed and Patient's Cardiovascular Status Stable  Post-op Vital Signs: Reviewed and stable  Complications: No apparent anesthesia complications

## 2011-12-08 NOTE — Discharge Summary (Signed)
Physician Discharge Summary  Patient ID: Jessica Davenport MRN: [id number] DOB/AGE: [birth year] 46 y.o.  Admit date: 12/07/2011 Discharge date: 12/08/2011  Admission Diagnoses: menometrorrhagia, uterine fibroid, dysmenorrhea  Discharge Diagnoses: same Active Problems:  * No active hospital problems. *    Discharged Condition: stable  Hospital Course: uncomplicated postop course  Consults: None  Significant Diagnostic Studies: labs: WBC 12.7 hgb 10.1 hct 32   Treatments: surgery: DAVINCI ROBOTIC TLH, BILATERAL SALPINGECTOMY  Discharge Exam: Blood pressure 145/68, pulse 66, temperature 98.2 F (36.8 C), temperature source Oral, resp. rate 18, height 5\' 7"  (1.702 m), weight 128.822 kg (284 lb), last menstrual period 11/10/2011, SpO2 96.00%. General appearance: alert, cooperative and no distress Resp: clear to auscultation bilaterally Cardio: regular rate and rhythm, S1, S2 normal, no murmur, click, rub or gallop GI: soft, non-tender; bowel sounds normal; no masses,  no organomegaly Extremities: no edema, redness or tenderness in the calves or thighs Incision/Wound:well approximated incision  Disposition: 01-Home or Self Care  Discharge Orders    Future Appointments: Provider: Department: Dept Phone: Center:   01/25/2012 9:00 AM Cherylynn Ridges, MD [facility] [phone] None     Medication List  As of 12/08/2011 10:50 AM   ASK your doctor about these medications         cephALEXin 500 MG capsule   Commonly known as: KEFLEX   Take 1 mg by mouth 4 (four) times daily. Pt completed seven day course of medication on or about 12/05/11.      ECOTRIN 325 MG EC tablet   Generic drug: aspirin   Take 325 mg by mouth daily.      furosemide 20 MG tablet   Commonly known as: LASIX   Take 1 tablet (20 mg total) by mouth daily.      Glucosamine HCl 1000 MG Tabs   Take by mouth daily.      Iron 325 (65 FE) MG Tabs   Take 1 tablet by mouth 3 (three) times daily as needed.     omeprazole 20 MG tablet   Commonly known as: PRILOSEC OTC   Take 1 tablet (20 mg total) by mouth daily.      PRENATAL 1 + IRON PO   Take 1 tablet by mouth daily.      traMADol 50 MG tablet   Commonly known as: ULTRAM   as needed.      zolpidem 10 MG tablet   Commonly known as: AMBIEN   TAKE 1 TABLET BY MOUTH AT BEDTIME AS NEEDED FOR INSOMNIA           Follow-up Information    Follow up with Remigio Mcmillon A, MD in 2 weeks.   Contact information:   [address] [place] [address] [phone]          Signed: Serita Kyle 12/08/2011, 10:50 AM

## 2011-12-14 ENCOUNTER — Ambulatory Visit (INDEPENDENT_AMBULATORY_CARE_PROVIDER_SITE_OTHER): Payer: 59 | Admitting: General Surgery

## 2012-01-25 ENCOUNTER — Encounter (INDEPENDENT_AMBULATORY_CARE_PROVIDER_SITE_OTHER): Payer: Self-pay | Admitting: General Surgery

## 2012-01-25 ENCOUNTER — Ambulatory Visit (INDEPENDENT_AMBULATORY_CARE_PROVIDER_SITE_OTHER): Payer: 59 | Admitting: General Surgery

## 2012-01-25 VITALS — BP 138/80 | HR 89 | Temp 97.4°F | Resp 14 | Ht 67.0 in | Wt 288.6 lb

## 2012-01-25 DIAGNOSIS — L723 Sebaceous cyst: Secondary | ICD-10-CM

## 2012-01-25 NOTE — Progress Notes (Signed)
HPI The patient was referred by Dr. Cherly Hensen for a right neck mass.  PE On examination she has a 2 cm right neck mass in zone 2 of her neck. It is currently not infected.  Studiy review There are no studies to review.  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  Plan The plan is to excise this as an outpatient after getting preoperative R. position and clearance. The patient has a past medical history he ASD repair. She is not anticoagulated.

## 2012-02-15 ENCOUNTER — Telehealth (INDEPENDENT_AMBULATORY_CARE_PROVIDER_SITE_OTHER): Payer: Self-pay

## 2012-02-15 ENCOUNTER — Encounter (INDEPENDENT_AMBULATORY_CARE_PROVIDER_SITE_OTHER): Payer: Self-pay | Admitting: Surgery

## 2012-02-15 ENCOUNTER — Encounter (INDEPENDENT_AMBULATORY_CARE_PROVIDER_SITE_OTHER): Payer: 59 | Admitting: Surgery

## 2012-02-15 ENCOUNTER — Ambulatory Visit (INDEPENDENT_AMBULATORY_CARE_PROVIDER_SITE_OTHER): Payer: 59 | Admitting: Surgery

## 2012-02-15 VITALS — BP 132/86 | HR 80 | Temp 99.0°F | Resp 18 | Ht 67.0 in | Wt 283.6 lb

## 2012-02-15 DIAGNOSIS — E66813 Obesity, class 3: Secondary | ICD-10-CM

## 2012-02-15 DIAGNOSIS — L723 Sebaceous cyst: Secondary | ICD-10-CM

## 2012-02-15 DIAGNOSIS — L089 Local infection of the skin and subcutaneous tissue, unspecified: Secondary | ICD-10-CM | POA: Insufficient documentation

## 2012-02-15 HISTORY — DX: Morbid (severe) obesity due to excess calories: E66.01

## 2012-02-15 HISTORY — DX: Obesity, class 3: E66.813

## 2012-02-15 MED ORDER — HYDROCODONE-ACETAMINOPHEN 5-325 MG PO TABS: 1.0000 | ORAL_TABLET | Freq: Four times a day (QID) | ORAL | Status: AC | PRN

## 2012-02-15 MED ORDER — SULFAMETHOXAZOLE-TMP DS 800-160 MG PO TABS: 2.0000 | ORAL_TABLET | Freq: Two times a day (BID) | ORAL | Status: AC

## 2012-02-15 NOTE — Patient Instructions (Addendum)
WOUND CARE  It is important that the wound be kept open.   -Keeping the skin edges apart will allow the wound to gradually heal from the base upwards.   - If the skin edges of the wound close too early, a new fluid pocket can form and infection can occur. -This is the reason to pack deeper wounds with gauze or ribbon -This is why drained wounds cannot be sewed closed right away  A healthy wound should form a lining of bright red "beefy" granulating tissue that will help shrink the wound and help the edges grow new skin into it.   -A little mucus / yellow discharge is normal (the body's natural way to try and form a scab) and should be gently washed off with soap and water with daily dressing changes.  -Green or foul smelling drainage implies bacterial colonization and can slow wound healing - a short course of antibiotic ointment (3-5 days) can help it clear up.  Call the doctor if it does not improve or worsens  -Avoid use of antibiotic ointments for more than a week as they can slow wound healing over time.    -Sometimes other wound care products will be used to reduce need for dressing changes and/or help clean up dirty wounds -Sometimes the surgeon needs to debride the wound in the office to remove dead or infected tissue out of the wound so it can heal more quickly and safely.    Change the dressing at least once a day -Wash the wound with mild soap and water gently every day.  It is good to shower or bathe the wound to help it clean out. -Use clean ribbon plain NU-gauze for smaller wounds (it does not need to be sterile, just clean) -Keep the raw wound moist with a little saline or KY (saline) gel on the gauze.  -A dry wound will take longer to heal.  -Keep the skin dry around the wound to prevent breakdown and irritation. -Pack the wound down to the base -The goal is to keep the skin apart, not overpack the wound -Use a Q-tip or blunt-tipped kabob stick toothpick to push the gauze down  to the base in narrow or deep wounds   -Cover with a clean gauze and tape -paper or Medipore tape tend to be gentle on the skin -rotate the orientation of the tape to avoid repeated stress/trauma on the skin -using an ACE or Coban wrap on wounds on arms or legs can be used instead.  Complete all antibiotics through the entire prescription to help the infection heal and prevent new places of infection   Returning the see the surgeon is helpful to follow the healing process and help the wound close as fast as possible.   

## 2012-02-15 NOTE — Telephone Encounter (Signed)
Pt was seen this morning for abscess. Pt states tylenol not helping her pain. Pt request something for pain be called to [facility] at [address]. Pt states she is not allergic to any meds. Please call pt [phone] with recommendation.

## 2012-02-15 NOTE — Addendum Note (Signed)
Addended by: Ethlyn Gallery on: 02/15/2012 02:12 PM   Modules accepted: Orders

## 2012-02-15 NOTE — Telephone Encounter (Signed)
[facility] notifying pt that we did call a Rx into [facility] on [address].

## 2012-02-15 NOTE — Progress Notes (Addendum)
Subjective:     Patient ID: Jessica Davenport, female   DOB: [birth year], 46 y.o.   MRN: [id number]  HPI  Jessica Davenport  1965/10/28 [id number]  Patient Care Team: [name], MD as PCP - General  This patient is a 46 y.o.female who presents today for surgical evaluation at the request of self.   Reason for visit: Painful lump on pelvis.  Probable abscess.  Patient is a pleasant morbidly obese female.  She think she had a skin infection in the distant past but none recently.  No history of MRSA.  She noticed a painful lump two days ago.  It has gradually gotten bigger.  She was concerned and request to be seen.  She's been seen by her and is due to have a mass on her neck which is probably a sebaceous cyst electively removed in a few months.  No fevers or chills   Patient Active Problem List  Diagnosis  . VITAMIN D DEFICIENCY  . ANEMIA-IRON DEFICIENCY  . DEPRESSIVE DISORDER  . PULMONARY HYPERTENSION  . ALLERGIC RHINITIS  . ECZEMA, ATOPIC  . DISC DISEASE, LUMBAR  . ELECTROCARDIOGRAM, ABNORMAL  . HYPERGLYCEMIA  . INSOMNIA-SLEEP DISORDER-UNSPEC  . Ostium secundum type atrial septal defect  . Pharyngitis  . Sebaceous cyst of right neck  . Infected sebaceous cyst on mons pubis  . Obesity, Class III, BMI 40-49.9 (morbid obesity)    Past Medical History  Diagnosis Date  . Allergy   . Anemia   . Lumbar disc disease   . Vitamin d deficiency   . 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 Pulmonary hypertension   . Morbid obesity   . Fibroid, uterine   . Abscess     Past Surgical History  Procedure Date  . Tubal ligation 1996  . Uterine ablation   . Abdominal hysterectomy   . Asd repair 2005/2009  . Endometrial ablation 2010,2011,2012    History   Social History  . Marital Status: Divorced    Spouse Name: N/A    Number of Children: 3  . Years of Education: N/A   Occupational History  . adult medicaid worker [employer]   Social  History Main Topics  . Smoking status: Never Smoker   . Smokeless tobacco: Never Used  . Alcohol Use: No  . Drug Use: No  . Sexually Active: Yes    Birth Control/ Protection: Surgical   Other Topics Concern  . Not on file   Social History Narrative   No regular exercise    Family History  Problem Relation Age of Onset  . Coronary artery disease Mother   . Hypertension Mother   . Diabetes Mother   . Breast cancer Mother   . Cancer Mother     breast  . Coronary artery disease Father   . Hypertension Father   . Diabetes Father   . Anxiety disorder Sister   . Cancer Maternal Aunt     breast, ovarian, and cervical  . Cancer Maternal Uncle     colon    Current Outpatient Prescriptions  Medication Sig Dispense Refill  . Ferrous Sulfate (IRON) 325 (65 FE) MG TABS Take 1 tablet by mouth 3 (three) times daily as needed.        . furosemide (LASIX) 20 MG tablet Take 1 tablet (20 mg total) by mouth daily.  90 tablet  3  . Glucosamine HCl 1000 MG TABS Take by mouth daily.        Marland Kitchen  ibuprofen (ADVIL,MOTRIN) 800 MG tablet       . omeprazole (PRILOSEC OTC) 20 MG tablet Take 1 tablet (20 mg total) by mouth daily.  10 tablet  0  . Prenatal Multivit-Min-Fe-FA (PRENATAL 1 + IRON PO) Take 1 tablet by mouth daily.        Marland Kitchen zolpidem (AMBIEN) 10 MG tablet TAKE 1 TABLET BY MOUTH AT BEDTIME AS NEEDED FOR INSOMNIA  30 tablet  0  . cyclobenzaprine (FLEXERIL) 5 MG tablet Ad lib.         No Known Allergies  BP 132/86  Pulse 80  Temp 99 F (37.2 C) (Temporal)  Resp 18  Ht 5\' 7"  (1.702 m)  Wt 283 lb 9.6 oz (128.64 kg)  BMI 44.42 kg/m2  No results found.   Review of Systems  Constitutional: Negative for fever, chills and diaphoresis.  HENT: Negative for ear pain, sore throat and trouble swallowing.   Eyes: Negative for photophobia and visual disturbance.  Respiratory: Negative for cough and choking.   Cardiovascular: Negative for chest pain and palpitations.  Gastrointestinal: Negative  for nausea, vomiting, abdominal pain, diarrhea, constipation, anal bleeding and rectal pain.  Genitourinary: Negative for dysuria, frequency and difficulty urinating.  Musculoskeletal: Negative for myalgias and gait problem.  Skin: Negative for color change, pallor and rash.  Neurological: Negative for dizziness, speech difficulty, weakness and numbness.  Hematological: Negative for adenopathy.  Psychiatric/Behavioral: Negative for confusion and agitation. The patient is not nervous/anxious.        Objective:   Physical Exam  Constitutional: She is oriented to person, place, and time. She appears well-developed and well-nourished. No distress.  HENT:  Head: Normocephalic.  Mouth/Throat: Oropharynx is clear and moist. No oropharyngeal exudate.  Eyes: Conjunctivae and EOM are normal. Pupils are equal, round, and reactive to light. No scleral icterus.  Neck: Normal range of motion. No tracheal deviation present.  Cardiovascular: Normal rate and intact distal pulses.   Pulmonary/Chest: Effort normal. No respiratory distress. She exhibits no tenderness.  Abdominal: Soft. She exhibits no distension. There is no tenderness. Hernia confirmed negative in the right inguinal area and confirmed negative in the left inguinal area.         Incisions clean with normal healing ridges.  No hernias  Genitourinary: No vaginal discharge found.  Musculoskeletal: Normal range of motion. She exhibits no tenderness.  Lymphadenopathy:       Right: No inguinal adenopathy present.       Left: No inguinal adenopathy present.  Neurological: She is alert and oriented to person, place, and time. No cranial nerve deficit. She exhibits normal muscle tone. Coordination normal.  Skin: Skin is warm and dry. No rash noted. She is not diaphoretic.  Psychiatric: She has a normal mood and affect. Her behavior is normal.       Assessment:     Infected seb cyst of mons pubis    Plan:     I drained it:  The  pathophysiology of subcutaneous abscess and differential diagnosis was discussed.  Natural history progression was discussed.  The patient's symptoms are not adequately controlled.  Non-operative treatment has not healed the abscess.  Therefore, I recommended incision & drainage of the abscess to allow the infection to resolve and heal.  Technique, risks, benefits, alternatives discussed.  The patient expressed understanding & wished to proceed.  I placed a field block with local anaesthetic.  I incised the skin over the abscess to release the infection.  I excised skin at the  wound to have an adequate opening for drainage & prevent skin reclosure.  I packed the wound with ribbon NU-Gauze.    The patient tolerated the procedure.    PO ABx x5 days (Bactrim DS BID) to treat cellulitis   Let the wick remain in place for a few days then remove it.  Packed thereafter.  We will have the patient return to clinic in ~1 week for close follow up to make sure the infection heals.  Addendum: Pt afraid to do dressing change herself - will have her come back in 2 days for wick change & make sure area is improving

## 2012-02-16 ENCOUNTER — Encounter (INDEPENDENT_AMBULATORY_CARE_PROVIDER_SITE_OTHER): Payer: 59

## 2012-02-16 ENCOUNTER — Telehealth (INDEPENDENT_AMBULATORY_CARE_PROVIDER_SITE_OTHER): Payer: Self-pay | Admitting: General Surgery

## 2012-02-16 NOTE — Telephone Encounter (Signed)
Pt called to verify dressing changes.  Reviewed Dr. Gordy Savers notes and related how to do dressings.  She understands.

## 2012-02-22 ENCOUNTER — Ambulatory Visit (INDEPENDENT_AMBULATORY_CARE_PROVIDER_SITE_OTHER): Payer: 59 | Admitting: General Surgery

## 2012-02-22 ENCOUNTER — Encounter (INDEPENDENT_AMBULATORY_CARE_PROVIDER_SITE_OTHER): Payer: Self-pay | Admitting: General Surgery

## 2012-02-22 VITALS — BP 116/70 | HR 68 | Temp 98.0°F | Resp 16 | Ht 67.0 in | Wt 282.4 lb

## 2012-02-22 DIAGNOSIS — Z09 Encounter for follow-up examination after completed treatment for conditions other than malignant neoplasm: Secondary | ICD-10-CM

## 2012-02-22 NOTE — Progress Notes (Signed)
The patient comes in today after incision and drainage of the mons pubis abscess. Because of pain and concerns over how she could do the dressing change she cannot remove the packing since it was placed one week ago.  For the first 2 days after the excision and drainage of this abscess she ran a fever over 101.5 but currently is afebrile. On examination her wound is packed with approximately 6 inches of quarter-inch Nu Gauze. There was still some induration laterally to the left however there is minimal to no pus remaining in the wound.  The Nu Gauze packing was removed and the cavity was cleansed with peroxide on a Q-tip. We then placed some antibiotic ointment on top of the wound and covered it with a folded 4 x 4 gauze. She is to perform dressing changes after showering at least once a day with triple antibiotic ointment on the wound. I will see her back in 2 weeks.

## 2012-03-14 ENCOUNTER — Encounter (INDEPENDENT_AMBULATORY_CARE_PROVIDER_SITE_OTHER): Payer: Self-pay | Admitting: General Surgery

## 2012-03-14 ENCOUNTER — Ambulatory Visit (INDEPENDENT_AMBULATORY_CARE_PROVIDER_SITE_OTHER): Payer: 59 | Admitting: General Surgery

## 2012-03-14 VITALS — BP 134/82 | HR 72 | Temp 97.0°F | Resp 20 | Ht 67.0 in | Wt 283.4 lb

## 2012-03-14 DIAGNOSIS — Z09 Encounter for follow-up examination after completed treatment for conditions other than malignant neoplasm: Secondary | ICD-10-CM

## 2012-03-14 NOTE — Progress Notes (Signed)
The patient's mons pubis infection has cleared. She has no recurrent infection. She is to have surgery on the sebaceous cyst of her neck in the near future.

## 2012-04-05 ENCOUNTER — Ambulatory Visit (INDEPENDENT_AMBULATORY_CARE_PROVIDER_SITE_OTHER): Payer: 59 | Admitting: Physician Assistant

## 2012-04-05 ENCOUNTER — Encounter: Payer: Self-pay | Admitting: Physician Assistant

## 2012-04-05 VITALS — BP 140/88 | HR 82 | Ht 67.0 in | Wt 286.0 lb

## 2012-04-05 DIAGNOSIS — Q2111 Secundum atrial septal defect: Secondary | ICD-10-CM

## 2012-04-05 DIAGNOSIS — R079 Chest pain, unspecified: Secondary | ICD-10-CM

## 2012-04-05 DIAGNOSIS — Q211 Atrial septal defect: Secondary | ICD-10-CM

## 2012-04-05 DIAGNOSIS — E66813 Obesity, class 3: Secondary | ICD-10-CM

## 2012-04-05 DIAGNOSIS — I2789 Other specified pulmonary heart diseases: Secondary | ICD-10-CM

## 2012-04-05 LAB — D-DIMER, QUANTITATIVE: D-Dimer, Quant: 0.67 ug/mL-FEU — ABNORMAL HIGH (ref 0.00–0.48)

## 2012-04-05 NOTE — Assessment & Plan Note (Signed)
Patient has atypical chest pain and is very tender to touch in her left chest. Because of her recent hysterectomy we will check a d-dimer and a 2-D echo. I reviewed her EKGs with Dr.Hochrein who agrees they are nonspecific findings. I've asked her to take Advil 2 q.4 to 6 hours with food for pain. She will follow up Dr. Daleen Squibb.

## 2012-04-05 NOTE — Assessment & Plan Note (Signed)
Patient needs to lose weight. 

## 2012-04-05 NOTE — Progress Notes (Signed)
HPI:  Jessica Davenport is a patient of Dr. Vern Claude who has a history of a secundum ASD repair, pulmonary hypertension, and morbid obesity. He last saw her in May and she was doing well. Her last echo was in 2010 and he did not feel she needed another one because she was asymptomatic and had a normal exam. She has had a partial hysterectomy in May and a sebaceous cyst removed from her pelvic area she was last seen. She is scheduled to have a sebaceous cyst removed from right neck next week.  Last week while sitting at her desk at work she had sudden onset of sharp shooting chest pain that radiated into her left shoulder and left back and left arm. She got up and moved around and took some deep breaths which helped. She took her aspirin and finally eased off. That night she had trouble sleeping on her left side. Since then she is complaining of off-and-on chest pain no discharge shooting at times and then turned into the slight ache. She denies straining herself in any way. She has been careful since her hysterectomy and has not done any heavy lifting. She denies dyspnea, palpitations, dizziness, leg swelling or cramps.     No Known Allergies  Prescriptions on File Prior to Visit: Ferrous Sulfate (IRON) 325 (65 FE) MG TABS, Take 1 tablet by mouth 3 (three) times daily as needed.  , Disp: , Rfl:  furosemide (LASIX) 20 MG tablet, Take 1 tablet (20 mg total) by mouth daily., Disp: 90 tablet, Rfl: 3 Glucosamine HCl 1000 MG TABS, Take by mouth daily.  , Disp: , Rfl:  ibuprofen (ADVIL,MOTRIN) 800 MG tablet, , Disp: , Rfl:  omeprazole (PRILOSEC OTC) 20 MG tablet, Take 1 tablet (20 mg total) by mouth daily., Disp: 10 tablet, Rfl: 0 Prenatal Multivit-Min-Fe-FA (PRENATAL 1 + IRON PO), Take 1 tablet by mouth daily.  , Disp: , Rfl:  zolpidem (AMBIEN) 10 MG tablet, TAKE 1 TABLET BY MOUTH AT BEDTIME AS NEEDED FOR INSOMNIA, Disp: 30 tablet, Rfl: 0    Past Medical History:   Allergy                                                       Anemia                                                       Lumbar disc disease                                          Vitamin d deficiency                                         Ostium secundum                                 03/27/04         Comment:S/P ASD  repair/Amplatzer device   Pulmonary hypertension                                       Morbid obesity                                               Fibroid, uterine                                             Abscess                                                      Infected sebaceous cyst                                        Comment:mons pubis  Past Surgical History:   TUBAL LIGATION                                  1996         uterine ablation                                             ABDOMINAL HYSTERECTOMY                                       ASD REPAIR                                      2005/2009    ENDOMETRIAL ABLATION                            2010,2011*  Review of patient's family history indicates:   Coronary artery disease        Mother                   Hypertension                   Mother                   Diabetes                       Mother                   Breast cancer                  Mother  Cancer                         Mother                     Comment: breast   Coronary artery disease        Father                   Hypertension                   Father                   Diabetes                       Father                   Anxiety disorder               Sister                   Cancer                         Maternal Aunt              Comment: breast, ovarian, and cervical   Cancer                         Maternal Uncle             Comment: colon   Social History   Marital Status: Divorced            Spouse Name:                      Years of Education:                 Number of children: 3           Occupational History Occupation           Psychiatric nurse              adult medicaid wor* [employer]       Social History Main Topics   Smoking Status: Never Smoker                     Smokeless Status: Never Used                       Alcohol Use: No             Drug Use: No             Sexual Activity: Yes                    Birth Control/Protection: Surgical  Other Topics            Concern   None on file  Social History Narrative   No regular exercise    ZOX:WRUEAVW denies any leg swelling,pain or erythema. See history of present illness otherwise negative.   PHYSICAL EXAM: Obese, in no acute distress. Neck: No JVD, HJR, Bruit, or thyroid enlargement  Lungs: No tachypnea, clear without wheezing, rales, or rhonchi  Cardiovascular:Patient is extremely tender to touch in  the left chest area and shoulder, RRR, PMI not displaced, heart sounds normal, no murmurs, gallops, bruit, thrill, or heave.  Abdomen: BS normal. Soft without organomegaly, masses, lesions or tenderness.  Extremities: without cyanosis, clubbing or edema. Good distal pulses bilateral  SKin: Warm, no lesions or rashes   Musculoskeletal: No deformities  Neuro: no focal signs  BP 140/88  Ht 5\' 7"  (1.702 m)  Wt 286 lb (129.729 kg)  BMI 44.79 kg/m2   NWG:NFAOZH sinus rhythm with nonspecific T-wave changes. No acute change when compared to EKG in May 2013. EKGs were reviewed by Dr. Antoine Poche.  2Decho:2010 SUMMARY -  Overall left ventricular systolic function was normal. Left       ventricular ejection fraction was estimated to be 65 %. There       was no diagnostic evidence of left ventricular regional wall       motion abnormalities. -  Normal aortic valve -  Normal mitral valve -  The right ventricle was mildly dilated. Right ventricular       systolic function was mildly reduced. -  There is no obvious shunting at the level of the ASD repair.  IMPRESSIONS -  There is no obvious shunting at the level of the ASD  repair.

## 2012-04-05 NOTE — Assessment & Plan Note (Signed)
Asymptomatic and blood pressure stable

## 2012-04-05 NOTE — Patient Instructions (Addendum)
Your physician has requested that you have an echocardiogram. Echocardiography is a painless test that uses sound waves to create images of your heart. It provides your doctor with information about the size and shape of your heart and how well your heart's chambers and valves are working. This procedure takes approximately one hour. There are no restrictions for this procedure.  Your physician has recommended you make the following change in your medication: take Advil every 4 to 6 hours as needed for pain   Your physician recommends that you return for lab work in: today  Your physician recommends that you schedule a follow-up appointment in: May with Dr. Daleen Squibb

## 2012-04-05 NOTE — Assessment & Plan Note (Signed)
Normal exam. Will check 2-D echo since one has not been done since 2010

## 2012-04-07 ENCOUNTER — Encounter: Payer: Self-pay | Admitting: Endocrinology

## 2012-04-07 ENCOUNTER — Ambulatory Visit (INDEPENDENT_AMBULATORY_CARE_PROVIDER_SITE_OTHER): Payer: 59 | Admitting: Endocrinology

## 2012-04-07 ENCOUNTER — Encounter: Payer: Self-pay | Admitting: General Practice

## 2012-04-07 VITALS — BP 132/80 | HR 84 | Temp 98.7°F | Resp 16 | Wt 288.0 lb

## 2012-04-07 DIAGNOSIS — J069 Acute upper respiratory infection, unspecified: Secondary | ICD-10-CM

## 2012-04-07 MED ORDER — CEFUROXIME AXETIL 250 MG PO TABS: 250.0000 mg | ORAL_TABLET | Freq: Two times a day (BID) | ORAL | Status: AC

## 2012-04-07 MED ORDER — TRAMADOL HCL 50 MG PO TABS: 50.0000 mg | ORAL_TABLET | Freq: Four times a day (QID) | ORAL | Status: DC | PRN

## 2012-04-07 NOTE — Progress Notes (Signed)
Subjective:    Patient ID: Jessica Davenport, female    DOB: Jul 02, 1966, 46 y.o.   MRN: [id number]  HPI Pt state few days of moderate pain at the throat, and assoc dry-quality cough.   Past Medical History  Diagnosis Date  . Allergy   . Anemia   . Lumbar disc disease   . Vitamin d deficiency   . 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 Pulmonary hypertension   . Morbid obesity   . Fibroid, uterine   . Abscess   . Infected sebaceous cyst     mons pubis    Past Surgical History  Procedure Date  . Tubal ligation 1996  . Uterine ablation   . Abdominal hysterectomy   . Asd repair 2005/2009  . Endometrial ablation 2010,2011,2012    History   Social History  . Marital Status: Divorced    Spouse Name: N/A    Number of Children: 3  . Years of Education: N/A   Occupational History  . adult medicaid worker [employer]   Social History Main Topics  . Smoking status: Never Smoker   . Smokeless tobacco: Never Used  . Alcohol Use: No  . Drug Use: No  . Sexually Active: Yes    Birth Control/ Protection: Surgical   Other Topics Concern  . Not on file   Social History Narrative   No regular exercise    Current Outpatient Prescriptions on File Prior to Visit  Medication Sig Dispense Refill  . aspirin 81 MG tablet Take 81 mg by mouth daily.      . Ferrous Sulfate (IRON) 325 (65 FE) MG TABS Take 1 tablet by mouth 3 (three) times daily as needed.        . furosemide (LASIX) 20 MG tablet Take 1 tablet (20 mg total) by mouth daily.  90 tablet  3  . Glucosamine HCl 1000 MG TABS Take by mouth daily.        Marland Kitchen ibuprofen (ADVIL,MOTRIN) 800 MG tablet       . omeprazole (PRILOSEC OTC) 20 MG tablet Take 1 tablet (20 mg total) by mouth daily.  10 tablet  0  . Prenatal Multivit-Min-Fe-FA (PRENATAL 1 + IRON PO) Take 1 tablet by mouth daily.        Marland Kitchen zolpidem (AMBIEN) 10 MG tablet TAKE 1 TABLET BY MOUTH AT BEDTIME AS NEEDED FOR INSOMNIA  30 tablet  0    No  Known Allergies  Family History  Problem Relation Age of Onset  . Coronary artery disease Mother   . Hypertension Mother   . Diabetes Mother   . Breast cancer Mother   . Cancer Mother     breast  . Coronary artery disease Father   . Hypertension Father   . Diabetes Father   . Anxiety disorder Sister   . Cancer Maternal Aunt     breast, ovarian, and cervical  . Cancer Maternal Uncle     colon    BP 132/80  Pulse 84  Temp 98.7 F (37.1 C) (Oral)  Resp 16  Wt 288 lb (130.636 kg)  SpO2 99%    Review of Systems She has fever of 101, and she has bilat earache    Objective:   Physical Exam VITAL SIGNS:  See vs page GENERAL: no distress head: no deformity eyes: no periorbital swelling, no proptosis external nose and ears are normal mouth: no lesion seen Both tm's are red LUNGS:  Clear  to auscultation       Assessment & Plan:

## 2012-04-07 NOTE — Patient Instructions (Addendum)
i have sent 2 prescriptions to your pharmacy: antibiotic and cough medication.   I hope you feel better soon.  If you don't feel better by next week, please call back.

## 2012-04-11 ENCOUNTER — Ambulatory Visit (HOSPITAL_COMMUNITY): Admission: RE | Admit: 2012-04-11 | Payer: 59 | Source: Ambulatory Visit | Admitting: General Surgery

## 2012-04-11 ENCOUNTER — Encounter (HOSPITAL_COMMUNITY): Admission: RE | Payer: Self-pay | Source: Ambulatory Visit

## 2012-04-11 SURGERY — EXCISION MASS
Anesthesia: General | Site: Neck | Laterality: Right

## 2012-04-14 ENCOUNTER — Ambulatory Visit (HOSPITAL_COMMUNITY): Payer: 59 | Attending: Internal Medicine | Admitting: Radiology

## 2012-04-14 DIAGNOSIS — I079 Rheumatic tricuspid valve disease, unspecified: Secondary | ICD-10-CM | POA: Insufficient documentation

## 2012-04-14 DIAGNOSIS — I059 Rheumatic mitral valve disease, unspecified: Secondary | ICD-10-CM | POA: Insufficient documentation

## 2012-04-14 DIAGNOSIS — R079 Chest pain, unspecified: Secondary | ICD-10-CM

## 2012-04-14 DIAGNOSIS — I2789 Other specified pulmonary heart diseases: Secondary | ICD-10-CM | POA: Insufficient documentation

## 2012-04-14 DIAGNOSIS — R072 Precordial pain: Secondary | ICD-10-CM

## 2012-04-14 DIAGNOSIS — E669 Obesity, unspecified: Secondary | ICD-10-CM | POA: Insufficient documentation

## 2012-04-14 NOTE — Progress Notes (Signed)
Echocardiogram performed.  

## 2012-04-20 ENCOUNTER — Ambulatory Visit (INDEPENDENT_AMBULATORY_CARE_PROVIDER_SITE_OTHER)
Admission: RE | Admit: 2012-04-20 | Discharge: 2012-04-20 | Disposition: A | Discharge status: Home / Self Care | Payer: 59 | Source: Ambulatory Visit | Attending: Internal Medicine | Admitting: Internal Medicine

## 2012-04-20 ENCOUNTER — Ambulatory Visit (INDEPENDENT_AMBULATORY_CARE_PROVIDER_SITE_OTHER): Payer: 59 | Admitting: Internal Medicine

## 2012-04-20 ENCOUNTER — Encounter: Payer: Self-pay | Admitting: Internal Medicine

## 2012-04-20 ENCOUNTER — Encounter: Payer: Self-pay | Admitting: *Deleted

## 2012-04-20 VITALS — BP 126/78 | HR 72 | Temp 98.3°F | Resp 16 | Wt 290.0 lb

## 2012-04-20 DIAGNOSIS — R05 Cough: Secondary | ICD-10-CM

## 2012-04-20 DIAGNOSIS — J45909 Unspecified asthma, uncomplicated: Secondary | ICD-10-CM | POA: Insufficient documentation

## 2012-04-20 DIAGNOSIS — R059 Cough, unspecified: Secondary | ICD-10-CM

## 2012-04-20 DIAGNOSIS — J209 Acute bronchitis, unspecified: Secondary | ICD-10-CM

## 2012-04-20 MED ORDER — HYDROCODONE-HOMATROPINE 5-1.5 MG/5ML PO SYRP: 5.0000 mL | ORAL_SOLUTION | Freq: Four times a day (QID) | ORAL | Status: DC | PRN

## 2012-04-20 MED ORDER — AZITHROMYCIN 500 MG PO TABS: 500.0000 mg | ORAL_TABLET | Freq: Every day | ORAL | Status: DC

## 2012-04-20 NOTE — Patient Instructions (Signed)

## 2012-04-20 NOTE — Progress Notes (Signed)
Subjective:    Patient ID: Jessica Davenport, female    DOB: [birth year], 46 y.o.   MRN: [id number]  Cough This is a recurrent problem. The current episode started 1 to 4 weeks ago. The problem has been gradually worsening. The problem occurs every few hours. The cough is productive of purulent sputum. Associated symptoms include chills and a sore throat. Pertinent negatives include no chest pain, ear congestion, ear pain, fever, headaches, heartburn, hemoptysis, myalgias, nasal congestion, postnasal drip, rash, rhinorrhea, shortness of breath, sweats, weight loss or wheezing. Nothing aggravates the symptoms. She has tried OTC cough suppressant (ceftin) for the symptoms. The treatment provided mild relief. Her past medical history is significant for bronchitis.      Review of Systems  Constitutional: Positive for chills. Negative for fever, weight loss, diaphoresis, activity change, appetite change, fatigue and unexpected weight change.  HENT: Positive for sore throat. Negative for ear pain, rhinorrhea and postnasal drip.   Eyes: Negative.   Respiratory: Positive for cough. Negative for apnea, hemoptysis, choking, chest tightness, shortness of breath, wheezing and stridor.   Cardiovascular: Negative for chest pain, palpitations and leg swelling.  Gastrointestinal: Negative for heartburn, nausea, vomiting, abdominal pain, diarrhea, constipation and blood in stool.  Genitourinary: Negative.   Musculoskeletal: Negative for myalgias, back pain, joint swelling, arthralgias and gait problem.  Skin: Negative for pallor, rash and wound.  Neurological: Negative.  Negative for headaches.  Hematological: Negative for adenopathy. Does not bruise/bleed easily.  Psychiatric/Behavioral: Negative.        Objective:   Physical Exam  Vitals reviewed. Constitutional: She is oriented to person, place, and time. Vital signs are normal. She appears well-developed and well-nourished.  Non-toxic appearance. She  does not have a sickly appearance. She does not appear ill. No distress.  HENT:  Head: Normocephalic and atraumatic.  Right Ear: Hearing, tympanic membrane, external ear and ear canal normal.  Left Ear: Hearing, tympanic membrane, external ear and ear canal normal.  Mouth/Throat: Oropharynx is clear and moist and mucous membranes are normal. Mucous membranes are not pale, not dry and not cyanotic. No oropharyngeal exudate, posterior oropharyngeal edema, posterior oropharyngeal erythema or tonsillar abscesses.  Eyes: Conjunctivae normal are normal. Right eye exhibits no discharge. Left eye exhibits no discharge. No scleral icterus.  Neck: Normal range of motion. Neck supple. No JVD present. No tracheal deviation present. No thyromegaly present.  Cardiovascular: Normal rate, regular rhythm, normal heart sounds and intact distal pulses.  Exam reveals no gallop and no friction rub.   No murmur heard. Pulmonary/Chest: Effort normal and breath sounds normal. No stridor. No respiratory distress. She has no wheezes. She has no rales. She exhibits no tenderness.  Abdominal: Soft. Bowel sounds are normal. She exhibits no distension and no mass. There is no tenderness. There is no rebound and no guarding.  Musculoskeletal: Normal range of motion. She exhibits no edema and no tenderness.  Lymphadenopathy:    She has no cervical adenopathy.  Neurological: She is oriented to person, place, and time.  Skin: Skin is warm and dry. No rash noted. She is not diaphoretic. No erythema. No pallor.  Psychiatric: She has a normal mood and affect. Her behavior is normal. Judgment and thought content normal.     Lab Results  Component Value Date   WBC 12.7* 12/08/2011   HGB 10.1* 12/08/2011   HCT 32.0* 12/08/2011   PLT 307 12/08/2011   GLUCOSE 120* 12/08/2011   ALT 12 09/05/2011   AST 12 09/05/2011  NA 136 12/08/2011   K 4.6 12/08/2011   CL 102 12/08/2011   CREATININE 0.67 12/08/2011   BUN 8 12/08/2011   CO2 28  12/08/2011   TSH 0.77 04/06/2010   HGBA1C 6.1 10/22/2010       Assessment & Plan:

## 2012-04-20 NOTE — Assessment & Plan Note (Signed)
She was previously treated with ceftin so I asked her to take Zpak this time to cover the atypicals, also will offer her an Rx to control the cough

## 2012-04-20 NOTE — Assessment & Plan Note (Signed)
I will check her CXR to see if she has PNA, edema, mass, etc. 

## 2012-05-11 ENCOUNTER — Ambulatory Visit: Payer: 59 | Admitting: Internal Medicine

## 2012-05-16 ENCOUNTER — Telehealth: Payer: Self-pay | Admitting: Cardiology

## 2012-05-16 NOTE — Telephone Encounter (Signed)
F/U   Returning call back to nurse.   

## 2012-05-16 NOTE — Telephone Encounter (Signed)
Letter was mailed to pt's home however she states she didn't have the results of her echo.  Reviewed normal results with pt who states understanding.

## 2012-05-16 NOTE — Telephone Encounter (Signed)
New Problem: ° ° ° °Patient called in wanting to know the results of her latest ECHO.  Please call back. °

## 2012-05-16 NOTE — Telephone Encounter (Signed)
LMTCB

## 2012-05-19 ENCOUNTER — Ambulatory Visit (INDEPENDENT_AMBULATORY_CARE_PROVIDER_SITE_OTHER): Payer: 59 | Admitting: Internal Medicine

## 2012-05-19 ENCOUNTER — Other Ambulatory Visit (INDEPENDENT_AMBULATORY_CARE_PROVIDER_SITE_OTHER): Payer: 59

## 2012-05-19 ENCOUNTER — Encounter: Payer: Self-pay | Admitting: Internal Medicine

## 2012-05-19 VITALS — BP 130/76 | HR 70 | Temp 98.2°F | Resp 16 | Wt 289.0 lb

## 2012-05-19 DIAGNOSIS — R7309 Other abnormal glucose: Secondary | ICD-10-CM

## 2012-05-19 DIAGNOSIS — R05 Cough: Secondary | ICD-10-CM

## 2012-05-19 DIAGNOSIS — J209 Acute bronchitis, unspecified: Secondary | ICD-10-CM

## 2012-05-19 DIAGNOSIS — D509 Iron deficiency anemia, unspecified: Secondary | ICD-10-CM

## 2012-05-19 DIAGNOSIS — I2789 Other specified pulmonary heart diseases: Secondary | ICD-10-CM

## 2012-05-19 DIAGNOSIS — E66813 Obesity, class 3: Secondary | ICD-10-CM

## 2012-05-19 DIAGNOSIS — Q211 Atrial septal defect: Secondary | ICD-10-CM

## 2012-05-19 DIAGNOSIS — R059 Cough, unspecified: Secondary | ICD-10-CM

## 2012-05-19 DIAGNOSIS — Q2111 Secundum atrial septal defect: Secondary | ICD-10-CM

## 2012-05-19 LAB — COMPREHENSIVE METABOLIC PANEL
Albumin: 3.5 g/dL (ref 3.5–5.2)
CO2: 29 mEq/L (ref 19–32)
Chloride: 105 mEq/L (ref 96–112)
GFR: 109.58 mL/min (ref >60.00)
Glucose, Bld: 91 mg/dL (ref 70–99)
Potassium: 4.5 mEq/L (ref 3.5–5.1)
Sodium: 139 mEq/L (ref 135–145)
Total Protein: 7.5 g/dL (ref 6.0–8.3)

## 2012-05-19 LAB — HEMOGLOBIN A1C: Hgb A1c MFr Bld: 5.8 % (ref 4.6–6.5)

## 2012-05-19 LAB — CBC WITH DIFFERENTIAL/PLATELET
Eosinophils Relative: 2.1 % (ref 0.0–5.0)
HCT: 35.1 % — ABNORMAL LOW (ref 36.0–46.0)
Monocytes Relative: 5.7 % (ref 3.0–12.0)
Neutrophils Relative %: 54.6 % (ref 43.0–77.0)
Platelets: 276 10*3/uL (ref 150.0–400.0)
WBC: 6.9 10*3/uL (ref 4.5–10.5)

## 2012-05-19 MED ORDER — BUDESONIDE-FORMOTEROL FUMARATE 160-4.5 MCG/ACT IN AERO: 2.0000 | INHALATION_SPRAY | Freq: Two times a day (BID) | RESPIRATORY_TRACT | Status: DC

## 2012-05-19 MED ORDER — HYDROCODONE-HOMATROPINE 5-1.5 MG/5ML PO SYRP: 5.0000 mL | ORAL_SOLUTION | Freq: Four times a day (QID) | ORAL | Status: AC | PRN

## 2012-05-19 NOTE — Patient Instructions (Signed)

## 2012-05-19 NOTE — Progress Notes (Signed)
Subjective:    Patient ID: Jessica Davenport, female    DOB: [birth year], 46 y.o.   MRN: [id number]  Cough This is a recurrent problem. The current episode started 1 to 4 weeks ago. The problem has been gradually improving. The problem occurs every few hours. The cough is non-productive. Associated symptoms include wheezing. Pertinent negatives include no chest pain, chills, ear congestion, ear pain, fever, headaches, heartburn, hemoptysis, myalgias, nasal congestion, postnasal drip, rash, rhinorrhea, sore throat, shortness of breath, sweats or weight loss. The symptoms are aggravated by cold air. She has tried prescription cough suppressant for the symptoms. The treatment provided significant relief. Her past medical history is significant for bronchitis.      Review of Systems  Constitutional: Negative for fever, chills, weight loss, diaphoresis, activity change, appetite change, fatigue and unexpected weight change.  HENT: Negative for ear pain, nosebleeds, congestion, sore throat, facial swelling, rhinorrhea, trouble swallowing, voice change, postnasal drip and sinus pressure.   Eyes: Negative.   Respiratory: Positive for cough and wheezing. Negative for apnea, hemoptysis, choking, chest tightness, shortness of breath and stridor.   Cardiovascular: Negative for chest pain, palpitations and leg swelling.  Gastrointestinal: Negative for heartburn, nausea, vomiting, abdominal pain, diarrhea, constipation and blood in stool.  Genitourinary: Negative.   Musculoskeletal: Negative for myalgias, back pain, joint swelling, arthralgias and gait problem.  Skin: Negative for color change, pallor, rash and wound.  Neurological: Negative for dizziness, tremors, seizures, syncope, facial asymmetry, speech difficulty, weakness, light-headedness, numbness and headaches.  Hematological: Negative for adenopathy. Does not bruise/bleed easily.  Psychiatric/Behavioral: Negative.        Objective:   Physical  Exam  Vitals reviewed. Constitutional: She is oriented to person, place, and time. She appears well-developed and well-nourished.  Non-toxic appearance. She does not have a sickly appearance. She does not appear ill. No distress.  HENT:  Head: Normocephalic and atraumatic.  Mouth/Throat: Oropharynx is clear and moist. No oropharyngeal exudate.  Eyes: Conjunctivae normal are normal. Right eye exhibits no discharge. Left eye exhibits no discharge. No scleral icterus.  Neck: Normal range of motion. Neck supple. No JVD present. No tracheal deviation present. No thyromegaly present.  Cardiovascular: Normal rate, regular rhythm, normal heart sounds and intact distal pulses.  Exam reveals no gallop and no friction rub.   No murmur heard. Pulmonary/Chest: Effort normal and breath sounds normal. No accessory muscle usage or stridor. No respiratory distress. She has no decreased breath sounds. She has no wheezes. She has no rhonchi. She has no rales. She exhibits no tenderness.  Abdominal: Soft. Bowel sounds are normal. She exhibits no distension and no mass. There is no tenderness. There is no rebound and no guarding.  Musculoskeletal: Normal range of motion. She exhibits no edema and no tenderness.  Lymphadenopathy:    She has no cervical adenopathy.  Neurological: She is oriented to person, place, and time.  Skin: Skin is warm and dry. No rash noted. She is not diaphoretic. No erythema. No pallor.  Psychiatric: She has a normal mood and affect. Her behavior is normal. Judgment and thought content normal.      Lab Results  Component Value Date   WBC 12.7* 12/08/2011   HGB 10.1* 12/08/2011   HCT 32.0* 12/08/2011   PLT 307 12/08/2011   GLUCOSE 120* 12/08/2011   ALT 12 09/05/2011   AST 12 09/05/2011   NA 136 12/08/2011   K 4.6 12/08/2011   CL 102 12/08/2011   CREATININE 0.67 12/08/2011   BUN  8 12/08/2011   CO2 28 12/08/2011   TSH 0.77 04/06/2010   HGBA1C 6.1 10/22/2010  Dg Chest 2 View  04/20/2012   *RADIOLOGY REPORT*  Clinical Data: Cough, bronchitis  CHEST - 2 VIEW  Comparison: 09/24/2011  Findings: Cardiomediastinal silhouette is stable.  Mild elevation of the right hemidiaphragm again noted. Stable ASD occluder position. No acute infiltrate or pulmonary edema.  Bony thorax is stable.  IMPRESSION: No active disease.  No significant change.   Original Report Authenticated By: Natasha Mead, M.D.      Assessment & Plan:

## 2012-05-21 ENCOUNTER — Encounter: Payer: Self-pay | Admitting: Internal Medicine

## 2012-05-21 NOTE — Assessment & Plan Note (Signed)
Her BP is well controlled and she has no s/s, I will check her lytes and renal function today

## 2012-05-21 NOTE — Assessment & Plan Note (Signed)
I will check her a1c to see if she has developed DM II 

## 2012-05-21 NOTE — Assessment & Plan Note (Signed)
I will check her CBC and iron levels today and will address if needed

## 2012-05-21 NOTE — Assessment & Plan Note (Signed)
She can continue taking the cough suppressant but I have added symbicort as well

## 2012-05-22 ENCOUNTER — Encounter: Payer: Self-pay | Admitting: Internal Medicine

## 2012-05-24 ENCOUNTER — Encounter: Payer: Self-pay | Admitting: Internal Medicine

## 2012-05-24 ENCOUNTER — Telehealth: Payer: Self-pay | Admitting: Internal Medicine

## 2012-05-24 ENCOUNTER — Ambulatory Visit (INDEPENDENT_AMBULATORY_CARE_PROVIDER_SITE_OTHER): Payer: 59 | Admitting: Internal Medicine

## 2012-05-24 VITALS — BP 112/78 | HR 79 | Temp 98.5°F | Resp 16 | Ht 67.0 in | Wt 287.0 lb

## 2012-05-24 DIAGNOSIS — R059 Cough, unspecified: Secondary | ICD-10-CM

## 2012-05-24 DIAGNOSIS — J45909 Unspecified asthma, uncomplicated: Secondary | ICD-10-CM

## 2012-05-24 DIAGNOSIS — R05 Cough: Secondary | ICD-10-CM

## 2012-05-24 MED ORDER — LEVOFLOXACIN 500 MG PO TABS: 500.0000 mg | ORAL_TABLET | Freq: Every day | ORAL | Status: DC

## 2012-05-24 NOTE — Progress Notes (Signed)
Subjective:    Patient ID: Jessica Davenport, female    DOB: July 07, 1966, 46 y.o.   MRN: [id number]  Cough This is a recurrent problem. The current episode started in the past 7 days. The problem has been gradually worsening. The problem occurs every few hours. The cough is productive of purulent sputum. Associated symptoms include ear pain and a sore throat. Pertinent negatives include no chest pain, chills, ear congestion, fever, headaches, heartburn, hemoptysis, myalgias, nasal congestion, postnasal drip, rash, rhinorrhea, shortness of breath, sweats, weight loss or wheezing. Nothing aggravates the symptoms. She has tried prescription cough suppressant, steroid inhaler and a beta-agonist inhaler for the symptoms. The treatment provided moderate relief. Her past medical history is significant for asthma and bronchitis. There is no history of pneumonia.      Review of Systems  Constitutional: Negative for fever, chills, weight loss, diaphoresis, activity change, appetite change, fatigue and unexpected weight change.  HENT: Positive for ear pain and sore throat. Negative for hearing loss, nosebleeds, congestion, facial swelling, rhinorrhea, trouble swallowing, neck stiffness, voice change, postnasal drip and ear discharge.   Eyes: Negative.   Respiratory: Positive for cough. Negative for apnea, hemoptysis, choking, chest tightness, shortness of breath, wheezing and stridor.   Cardiovascular: Negative for chest pain, palpitations and leg swelling.  Gastrointestinal: Negative for heartburn, nausea, vomiting, diarrhea, constipation and blood in stool.  Genitourinary: Negative.   Musculoskeletal: Negative for myalgias, back pain, joint swelling, arthralgias and gait problem.  Skin: Negative for color change, pallor, rash and wound.  Neurological: Negative.  Negative for dizziness, tremors, seizures, syncope, facial asymmetry, speech difficulty, weakness, light-headedness, numbness and headaches.    Hematological: Negative for adenopathy. Does not bruise/bleed easily.  Psychiatric/Behavioral: Negative.        Objective:   Physical Exam  Vitals reviewed. Constitutional: She is oriented to person, place, and time. She appears well-developed and well-nourished.  Non-toxic appearance. She does not have a sickly appearance. She does not appear ill. No distress.  HENT:  Head: Normocephalic and atraumatic. No trismus in the jaw.  Right Ear: Hearing, tympanic membrane, external ear and ear canal normal.  Left Ear: Hearing, tympanic membrane, external ear and ear canal normal.  Nose: No mucosal edema or rhinorrhea. Right sinus exhibits no maxillary sinus tenderness and no frontal sinus tenderness. Left sinus exhibits no maxillary sinus tenderness and no frontal sinus tenderness.  Mouth/Throat: Mucous membranes are normal. Mucous membranes are not pale, not dry and not cyanotic. No oral lesions. No uvula swelling. Posterior oropharyngeal erythema present. No oropharyngeal exudate, posterior oropharyngeal edema or tonsillar abscesses.  Eyes: Conjunctivae normal are normal. Right eye exhibits no discharge. Left eye exhibits no discharge. No scleral icterus.  Neck: Normal range of motion. Neck supple. No JVD present. No tracheal deviation present. No thyromegaly present.  Cardiovascular: Normal rate, regular rhythm, normal heart sounds and intact distal pulses.  Exam reveals no gallop and no friction rub.   No murmur heard. Pulmonary/Chest: Effort normal and breath sounds normal. No stridor. No respiratory distress. She has no wheezes. She has no rales. She exhibits no tenderness.  Abdominal: Soft. Bowel sounds are normal. She exhibits no distension and no mass. There is no tenderness. There is no rebound and no guarding.  Musculoskeletal: Normal range of motion. She exhibits no edema and no tenderness.  Lymphadenopathy:    She has no cervical adenopathy.  Neurological: She is oriented to person,  place, and time.  Skin: Skin is warm and dry. No rash noted.  She is not diaphoretic. No erythema. No pallor.  Psychiatric: She has a normal mood and affect. Her behavior is normal. Judgment and thought content normal.      Dg Chest 2 View  04/20/2012  *RADIOLOGY REPORT*  Clinical Data: Cough, bronchitis  CHEST - 2 VIEW  Comparison: 09/24/2011  Findings: Cardiomediastinal silhouette is stable.  Mild elevation of the right hemidiaphragm again noted. Stable ASD occluder position. No acute infiltrate or pulmonary edema.  Bony thorax is stable.  IMPRESSION: No active disease.  No significant change.   Original Report Authenticated By: Natasha Mead, M.D.  .    Assessment & Plan:

## 2012-05-24 NOTE — Patient Instructions (Signed)

## 2012-05-24 NOTE — Telephone Encounter (Signed)
Caller: Lorana/Patient; Patient Name: Jessica Davenport; PCP: Sanda Linger (Adults only); Best Callback Phone Number: [phone]; Onset is still dealing with Bronchitis that started in October 2013-Was seen again on 05/21/12-- and continues very tired, can not do steps due to being winded  Is using Symbicort and cough medicine but feels like she has gotten worse. Feels like some one  is sitting in chest.  Cough is dry non productive; Denies nasal discharge.  Has mild headache due to lack of rest--at night wakes many times due to waking to cough.  Throat and ears have begun to hurt.  Still sore in left side of chest but cardiologist said it was ok.  Triaged using breathing problems with a disposition to see provider within 4 hours due to new or worsening breathing problems not responding to treatment pain.  Appointment scheduled today 05/24/12 with Dr. Yetta Barre at 15:30.

## 2012-05-26 NOTE — Assessment & Plan Note (Signed)
She appears to have another bacterial infection, I will treat with levaquin She will continue her inhalers and cough suppressant

## 2012-06-01 ENCOUNTER — Emergency Department (HOSPITAL_COMMUNITY): Payer: 59

## 2012-06-01 ENCOUNTER — Encounter (HOSPITAL_COMMUNITY): Payer: Self-pay | Admitting: Emergency Medicine

## 2012-06-01 ENCOUNTER — Emergency Department (HOSPITAL_COMMUNITY)
Admission: EM | Admit: 2012-06-01 | Discharge: 2012-06-02 | Disposition: A | Discharge status: Home / Self Care | Payer: 59 | Attending: Emergency Medicine | Admitting: Emergency Medicine

## 2012-06-01 DIAGNOSIS — M51379 Other intervertebral disc degeneration, lumbosacral region without mention of lumbar back pain or lower extremity pain: Secondary | ICD-10-CM | POA: Insufficient documentation

## 2012-06-01 DIAGNOSIS — R1013 Epigastric pain: Secondary | ICD-10-CM | POA: Insufficient documentation

## 2012-06-01 DIAGNOSIS — E559 Vitamin D deficiency, unspecified: Secondary | ICD-10-CM | POA: Insufficient documentation

## 2012-06-01 DIAGNOSIS — Z7982 Long term (current) use of aspirin: Secondary | ICD-10-CM | POA: Insufficient documentation

## 2012-06-01 DIAGNOSIS — M5137 Other intervertebral disc degeneration, lumbosacral region: Secondary | ICD-10-CM | POA: Insufficient documentation

## 2012-06-01 DIAGNOSIS — I2789 Other specified pulmonary heart diseases: Secondary | ICD-10-CM | POA: Insufficient documentation

## 2012-06-01 DIAGNOSIS — Z8739 Personal history of other diseases of the musculoskeletal system and connective tissue: Secondary | ICD-10-CM | POA: Insufficient documentation

## 2012-06-01 DIAGNOSIS — Z79899 Other long term (current) drug therapy: Secondary | ICD-10-CM | POA: Insufficient documentation

## 2012-06-01 DIAGNOSIS — R109 Unspecified abdominal pain: Secondary | ICD-10-CM

## 2012-06-01 LAB — CBC WITH DIFFERENTIAL/PLATELET
Basophils Absolute: 0 10*3/uL (ref 0.0–0.1)
Eosinophils Relative: 1 % (ref 0–5)
HCT: 36.3 % (ref 36.0–46.0)
Lymphocytes Relative: 23 % (ref 12–46)
Lymphs Abs: 2.5 10*3/uL (ref 0.7–4.0)
MCV: 83.8 fL (ref 78.0–100.0)
Neutro Abs: 7.9 10*3/uL — ABNORMAL HIGH (ref 1.7–7.7)
Platelets: 269 10*3/uL (ref 150–400)
RBC: 4.33 MIL/uL (ref 3.87–5.11)
RDW: 13.8 % (ref 11.5–15.5)
WBC: 10.9 10*3/uL — ABNORMAL HIGH (ref 4.0–10.5)

## 2012-06-01 MED ORDER — HYDROMORPHONE HCL PF 1 MG/ML IJ SOLN
1.0000 mg | Freq: Once | INTRAMUSCULAR | Status: AC
  Administered 2012-06-02: 1 mg via INTRAVENOUS
  Filled 2012-06-01: qty 1

## 2012-06-01 MED ORDER — PANTOPRAZOLE SODIUM 40 MG IV SOLR
40.0000 mg | Freq: Once | INTRAVENOUS | Status: AC
  Administered 2012-06-02: 40 mg via INTRAVENOUS
  Filled 2012-06-01: qty 40

## 2012-06-01 MED ORDER — ONDANSETRON HCL 4 MG/2ML IJ SOLN
4.0000 mg | Freq: Once | INTRAMUSCULAR | Status: AC
  Administered 2012-06-02: 4 mg via INTRAVENOUS
  Filled 2012-06-01: qty 2

## 2012-06-01 MED ORDER — SODIUM CHLORIDE 0.9 % IV BOLUS (SEPSIS)
1000.0000 mL | Freq: Once | INTRAVENOUS | Status: AC
  Administered 2012-06-02: 1000 mL via INTRAVENOUS

## 2012-06-01 MED ORDER — SODIUM CHLORIDE 0.9 % IV SOLN: INTRAVENOUS | Status: DC

## 2012-06-01 NOTE — ED Notes (Signed)
Patient complaining of upper abdominal pain that started around 1500 this afternoon; describes pain as "sharp".  Denies nausea, vomiting, and diarrhea.  Patient reports history of similar pains in the past; patient has been worked up for gallbladder issues.

## 2012-06-01 NOTE — ED Provider Notes (Signed)
History     CSN: [id number]  Arrival date & time 06/01/12  2310   First MD Initiated Contact with Patient 06/01/12 2326      Chief Complaint  Patient presents with  . Abdominal Pain    (Consider location/radiation/quality/duration/timing/severity/associated sxs/prior treatment) Patient is a 46 y.o. female presenting with abdominal pain. The history is provided by the patient.  Abdominal Pain The primary symptoms of the illness include abdominal pain.   patient here with sudden onset of sharp upper epigastric pain that started after she Congo food. Denies any nausea vomiting or diarrhea. No fever. Denies any urinary symptoms. No vaginal bleeding or discharge. Pain starts in her epigastric area radiates to her back. History of similar symptoms in the past and had a negative gallbladder ultrasound a year ago. Seen by her Dr. weeks ago diagnosis of bronchitis and is currently taking Levaquin. Patient denied taking medications for this prior to arrival. Symptoms are worse with certain movements. Denies any cough or congestion currently. No fever chills  Past Medical History  Diagnosis Date  . Allergy   . Anemia   . Lumbar disc disease   . Vitamin D deficiency   . 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 Pulmonary hypertension   . Morbid obesity   . Fibroid, uterine   . Abscess   . Infected sebaceous cyst     mons pubis    Past Surgical History  Procedure Date  . Tubal ligation 1996  . Uterine ablation   . Abdominal hysterectomy   . Asd repair 2005/2009  . Endometrial ablation 2010,2011,2012    Family History  Problem Relation Age of Onset  . Coronary artery disease Mother   . Hypertension Mother   . Diabetes Mother   . Breast cancer Mother   . Cancer Mother     breast  . Coronary artery disease Father   . Hypertension Father   . Diabetes Father   . Anxiety disorder Sister   . Cancer Maternal Aunt     breast, ovarian, and cervical  . Cancer  Maternal Uncle     colon    History  Substance Use Topics  . Smoking status: Never Smoker   . Smokeless tobacco: Never Used  . Alcohol Use: No    OB History    Grav Para Term Preterm Abortions TAB SAB Ect Mult Living                  Review of Systems  Gastrointestinal: Positive for abdominal pain.  All other systems reviewed and are negative.    Allergies  Other  Home Medications   Current Outpatient Rx  Name  Route  Sig  Dispense  Refill  . ASPIRIN 81 MG PO TABS   Oral   Take 81 mg by mouth daily.         . BUDESONIDE-FORMOTEROL FUMARATE 160-4.5 MCG/ACT IN AERO   Inhalation   Inhale 2 puffs into the lungs 2 (two) times daily.   3 Inhaler   0   . FUROSEMIDE 20 MG PO TABS   Oral   Take 1 tablet (20 mg total) by mouth daily.   90 tablet   3   . GLUCOSAMINE HCL 1000 MG PO TABS   Oral   Take by mouth daily.           Marland Kitchen HYDROCODONE-HOMATROPINE 5-1.5 MG/5ML PO SYRP   Oral   Take 5 mLs by mouth at bedtime  as needed. For bronchitis         . LEVOFLOXACIN 500 MG PO TABS   Oral   Take 1 tablet (500 mg total) by mouth daily.   7 tablet   0   . OMEPRAZOLE MAGNESIUM 20 MG PO TBEC   Oral   Take 1 tablet (20 mg total) by mouth daily.   10 tablet   0   . PRENATAL 1 + IRON PO   Oral   Take 1 tablet by mouth daily.           Marland Kitchen ZOLPIDEM TARTRATE 10 MG PO TABS      TAKE 1 TABLET BY MOUTH AT BEDTIME AS NEEDED FOR INSOMNIA   30 tablet   0     BP 138/68  Pulse 70  Temp 97.6 F (36.4 C) (Oral)  Resp 18  SpO2 100%  Physical Exam  Nursing note and vitals reviewed. Constitutional: She is oriented to person, place, and time. She appears well-developed and well-nourished.  Non-toxic appearance. No distress.  HENT:  Head: Normocephalic and atraumatic.  Eyes: Conjunctivae normal, EOM and lids are normal. Pupils are equal, round, and reactive to light.  Neck: Normal range of motion. Neck supple. No tracheal deviation present. No mass present.    Cardiovascular: Normal rate, regular rhythm and normal heart sounds.  Exam reveals no gallop.   No murmur heard. Pulmonary/Chest: Effort normal and breath sounds normal. No stridor. No respiratory distress. She has no decreased breath sounds. She has no wheezes. She has no rhonchi. She has no rales.  Abdominal: Soft. Normal appearance and bowel sounds are normal. She exhibits no distension. There is tenderness in the epigastric area. There is no rigidity, no rebound, no guarding and no CVA tenderness.  Musculoskeletal: Normal range of motion. She exhibits no edema and no tenderness.  Neurological: She is alert and oriented to person, place, and time. She has normal strength. No cranial nerve deficit or sensory deficit. GCS eye subscore is 4. GCS verbal subscore is 5. GCS motor subscore is 6.  Skin: Skin is warm and dry. No abrasion and no rash noted.  Psychiatric: She has a normal mood and affect. Her speech is normal and behavior is normal.    ED Course  Procedures (including critical care time)   Labs Reviewed  CBC WITH DIFFERENTIAL  COMPREHENSIVE METABOLIC PANEL  URINALYSIS, ROUTINE W REFLEX MICROSCOPIC  LIPASE, BLOOD   No results found.   No diagnosis found.    MDM  2:13 AM Patient given meds for her abdominal pain and felt better transiently but pain returned. Will order abdominal CT   5:32 AM Patient's abdominal CT shows possible early small bowel obstruction. She has not had any vomiting prior to arrival or here. She will be placed on liquid diet and give anti-medics and was given return instructions    Toy Baker, MD 06/02/12 [phone]

## 2012-06-02 ENCOUNTER — Encounter (HOSPITAL_COMMUNITY): Payer: Self-pay | Admitting: Radiology

## 2012-06-02 ENCOUNTER — Emergency Department (HOSPITAL_COMMUNITY): Payer: 59

## 2012-06-02 LAB — COMPREHENSIVE METABOLIC PANEL
ALT: 9 U/L (ref 0–35)
AST: 18 U/L (ref 0–37)
Alkaline Phosphatase: 74 U/L (ref 39–117)
CO2: 25 mEq/L (ref 19–32)
Calcium: 9.2 mg/dL (ref 8.4–10.5)
Chloride: 99 mEq/L (ref 96–112)
GFR calc Af Amer: >90 mL/min (ref >90)
GFR calc non Af Amer: >90 mL/min (ref >90)
Glucose, Bld: 124 mg/dL — ABNORMAL HIGH (ref 70–99)
Sodium: 132 mEq/L — ABNORMAL LOW (ref 135–145)
Total Bilirubin: 0.5 mg/dL (ref 0.3–1.2)

## 2012-06-02 LAB — URINALYSIS, ROUTINE W REFLEX MICROSCOPIC
Bilirubin Urine: NEGATIVE
Hgb urine dipstick: NEGATIVE
Ketones, ur: NEGATIVE mg/dL
Nitrite: NEGATIVE
Protein, ur: NEGATIVE mg/dL
Urobilinogen, UA: 0.2 mg/dL (ref 0.0–1.0)

## 2012-06-02 LAB — POCT PREGNANCY, URINE: Preg Test, Ur: NEGATIVE

## 2012-06-02 MED ORDER — FAMOTIDINE 20 MG PO TABS
40.0000 mg | ORAL_TABLET | Freq: Once | ORAL | Status: AC
  Administered 2012-06-02: 40 mg via ORAL
  Filled 2012-06-02: qty 2

## 2012-06-02 MED ORDER — SUCRALFATE 1 G PO TABS
1.0000 g | ORAL_TABLET | Freq: Three times a day (TID) | ORAL | Status: DC
  Administered 2012-06-02: 1 g via ORAL
  Filled 2012-06-02 (×6): qty 1

## 2012-06-02 MED ORDER — IOHEXOL 300 MG/ML  SOLN
100.0000 mL | Freq: Once | INTRAMUSCULAR | Status: AC | PRN
  Administered 2012-06-02: 100 mL via INTRAVENOUS

## 2012-06-02 MED ORDER — IOHEXOL 300 MG/ML  SOLN
20.0000 mL | INTRAMUSCULAR | Status: AC
  Administered 2012-06-02: 20 mL via ORAL

## 2012-06-02 MED ORDER — METHOCARBAMOL 500 MG PO TABS: 500.0000 mg | ORAL_TABLET | Freq: Two times a day (BID) | ORAL | Status: DC

## 2012-06-02 MED ORDER — GI COCKTAIL ~~LOC~~
30.0000 mL | Freq: Once | ORAL | Status: AC
  Administered 2012-06-02: 30 mL via ORAL
  Filled 2012-06-02: qty 30

## 2012-06-02 MED ORDER — ONDANSETRON 8 MG PO TBDP: 8.0000 mg | ORAL_TABLET | Freq: Three times a day (TID) | ORAL | Status: DC | PRN

## 2012-06-02 MED ORDER — HYDROMORPHONE HCL PF 1 MG/ML IJ SOLN
1.0000 mg | Freq: Once | INTRAMUSCULAR | Status: AC
  Administered 2012-06-02: 1 mg via INTRAVENOUS
  Filled 2012-06-02: qty 1

## 2012-06-02 NOTE — ED Notes (Signed)
In to check on pt.  Pt not drinking po contrast.  Informed pt importance of po contrast for scan and pt stated will try to drink it.  Ct tech aware

## 2012-06-02 NOTE — ED Notes (Signed)
Pt dc to home with family. Pt will f/u with pcp as needed or return here. Pt verbalizes all dc instructions at this time.

## 2012-06-02 NOTE — ED Notes (Signed)
Pt continues to c/o abd pain.  md aware and new orders obtained.  Pt awaiting ct scan at this time.  Family at bedside.

## 2012-06-05 ENCOUNTER — Telehealth: Payer: Self-pay | Admitting: Internal Medicine

## 2012-06-05 NOTE — Telephone Encounter (Signed)
I will see her 

## 2012-06-05 NOTE — Telephone Encounter (Signed)
Dr. Juanda Chance had a cancellation. Moved appointment to 06/07/12 at 10:30 AM with Dr. Juanda Chance.

## 2012-06-05 NOTE — Telephone Encounter (Signed)
Patient was seen in ED on Friday for ?partial SBO per CT scan. She states she is still on clear liquids. Her pain is better but she is still having nausea. She is taking Zofran for nausea with some relief. Wants to be seen. Scheduled with Mike Gip, PA on 06/06/12 at 3:00 PM.

## 2012-06-06 ENCOUNTER — Ambulatory Visit: Payer: 59 | Admitting: Physician Assistant

## 2012-06-06 ENCOUNTER — Encounter: Payer: Self-pay | Admitting: *Deleted

## 2012-06-07 ENCOUNTER — Ambulatory Visit (INDEPENDENT_AMBULATORY_CARE_PROVIDER_SITE_OTHER)
Admission: RE | Admit: 2012-06-07 | Discharge: 2012-06-07 | Disposition: A | Discharge status: Home / Self Care | Payer: 59 | Source: Ambulatory Visit | Attending: Internal Medicine | Admitting: Internal Medicine

## 2012-06-07 ENCOUNTER — Encounter: Payer: Self-pay | Admitting: Internal Medicine

## 2012-06-07 ENCOUNTER — Other Ambulatory Visit (INDEPENDENT_AMBULATORY_CARE_PROVIDER_SITE_OTHER): Payer: 59

## 2012-06-07 ENCOUNTER — Ambulatory Visit (INDEPENDENT_AMBULATORY_CARE_PROVIDER_SITE_OTHER): Payer: 59 | Admitting: Internal Medicine

## 2012-06-07 VITALS — BP 122/70 | HR 76 | Ht 66.0 in | Wt 286.1 lb

## 2012-06-07 DIAGNOSIS — K56609 Unspecified intestinal obstruction, unspecified as to partial versus complete obstruction: Secondary | ICD-10-CM

## 2012-06-07 DIAGNOSIS — R1033 Periumbilical pain: Secondary | ICD-10-CM

## 2012-06-07 DIAGNOSIS — R059 Cough, unspecified: Secondary | ICD-10-CM

## 2012-06-07 DIAGNOSIS — R05 Cough: Secondary | ICD-10-CM

## 2012-06-07 LAB — CBC WITH DIFFERENTIAL/PLATELET
Basophils Absolute: 0 10*3/uL (ref 0.0–0.1)
HCT: 37 % (ref 36.0–46.0)
Hemoglobin: 11.9 g/dL — ABNORMAL LOW (ref 12.0–15.0)
Lymphs Abs: 2.4 10*3/uL (ref 0.7–4.0)
MCHC: 32.3 g/dL (ref 30.0–36.0)
MCV: 85.3 fl (ref 78.0–100.0)
Monocytes Absolute: 0.3 10*3/uL (ref 0.1–1.0)
Monocytes Relative: 4.3 % (ref 3.0–12.0)
Neutro Abs: 4.1 10*3/uL (ref 1.4–7.7)
Platelets: 322 10*3/uL (ref 150.0–400.0)
RDW: 14.9 % — ABNORMAL HIGH (ref 11.5–14.6)

## 2012-06-07 MED ORDER — TRAMADOL HCL 50 MG PO TABS: ORAL_TABLET | ORAL | Status: DC

## 2012-06-07 NOTE — Patient Instructions (Addendum)
We have sent the following medications to your pharmacy for you to pick up at your convenience: Tramadol Your physician has requested that you go to the basement for the following lab work before leaving today: CBC Please go to the basement floor of the building before leaving today. Go to radiology for your KUB x ray. We have given you a note for work to be out the rest of this week. CC: Dr Sanda Linger

## 2012-06-07 NOTE — Progress Notes (Signed)
Jessica Davenport [birth year] MRN [id number]  History of Present Illness:  This is a 46 year old African American female with acute abdominal pain which started 5 days ago and was evaluated in the emergency room and showed to be a partial small bowel obstruction. The episodes started while at work and it was localized to the periumbilical area. She had 2 normal bowel movements but there was no vomiting. Patient underwent a vaginal hysterectomy in May 2013. She has never had an abdominal surgery before. A CT scan of the abdomen and pelvis in the emergency room showed mildly prominent small bowel loops in the pelvis with mild edema and stranding within the mesentery surrounding the small bowel. Her white blood cell count was 10,900. Her hemoglobin was 12.1. Her upper abdominal ultrasound in November 2012 was normal. She has remained on a clear liquid diet and ate some food yesterday. She denies fever or rectal bleeding. She did not have any bowel movements for 3 days but finally had a normal BM yesterday.   Past Medical History  Diagnosis Date  . Allergy   . Anemia   . Lumbar disc disease   . Vitamin D deficiency   . 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 Pulmonary hypertension   . Morbid obesity   . Fibroid, uterine   . Abscess   . Infected sebaceous cyst     mons pubis  . Small bowel obstruction 2013   Past Surgical History  Procedure Date  . Tubal ligation 1996  . Uterine ablation   . Abdominal hysterectomy 12/07/11  . Asd repair 2005/2009  . Endometrial ablation 2010,2011,2012    reports that she has never smoked. She has never used smokeless tobacco. She reports that she does not drink alcohol or use illicit drugs. family history includes Anxiety disorder in her sister; Breast cancer in her maternal aunt and mother; Cervical cancer in her maternal aunt; Colon cancer in her maternal uncle; Coronary artery disease in her father and mother; Diabetes in her father  and mother; Hypertension in her father and mother; and Ovarian cancer in her maternal aunt. Allergies  Allergen Reactions  . Other     Celery and green peas        Review of Systems: Denies nausea or vomiting. She has had increased reflux. Denies rectal bleeding  The remainder of the 10 point ROS is negative except as outlined in H&P   Physical Exam: General appearance  Well developed, in no distress. Overweight Eyes- non icteric. HEENT nontraumatic, normocephalic. Mouth no lesions, tongue papillated, no cheilosis. Neck supple without adenopathy, thyroid not enlarged, no carotid bruits, no JVD. Lungs Clear to auscultation bilaterally. Cor normal S1, normal S2, regular rhythm, no murmur,  quiet precordium. Abdomen: Diffuse tenderness. No rebound. Quiet bowel sounds. No distention tender in all quadrants. Rectal: Small amount of soft Hemoccult negative stool. Extremities no pedal edema. Skin no lesions. Neurological alert and oriented x 3. Psychological normal mood and affect.  Assessment and Plan:  Problem #1 Episode of acute abdominal pain which is slowly resolving. A CT scan showed questionable partial small bowel obstruction. We will recheck her KUB today and obtain a CBC to followup on her leukocytosis. She will stay on a liquid diet and increase her Prilosec to 20 mg twice a day. We will give her tramadol 50 mg when necessary for abdominal pain. We will take her out of work for the rest of the week. If her symptoms get worse  or if she develops fever, she needs to call us immediately.   06/07/2012 Lina Sar

## 2012-06-09 ENCOUNTER — Telehealth: Payer: Self-pay | Admitting: Internal Medicine

## 2012-06-09 NOTE — Telephone Encounter (Signed)
Patient calling with update on condition. States she feels better, temperature is 99.3. She is taking Zofran for nausea and it helps. Denies vomiting. She is feeling some better today.

## 2012-06-16 ENCOUNTER — Other Ambulatory Visit (INDEPENDENT_AMBULATORY_CARE_PROVIDER_SITE_OTHER): Payer: 59

## 2012-06-16 ENCOUNTER — Ambulatory Visit (INDEPENDENT_AMBULATORY_CARE_PROVIDER_SITE_OTHER)
Admission: RE | Admit: 2012-06-16 | Discharge: 2012-06-16 | Disposition: A | Discharge status: Home / Self Care | Payer: 59 | Source: Ambulatory Visit | Attending: Internal Medicine | Admitting: Internal Medicine

## 2012-06-16 ENCOUNTER — Telehealth: Payer: Self-pay

## 2012-06-16 ENCOUNTER — Ambulatory Visit (INDEPENDENT_AMBULATORY_CARE_PROVIDER_SITE_OTHER): Payer: 59 | Admitting: Internal Medicine

## 2012-06-16 ENCOUNTER — Encounter: Payer: Self-pay | Admitting: Internal Medicine

## 2012-06-16 VITALS — BP 116/64 | HR 81 | Temp 98.0°F | Resp 16 | Wt 288.5 lb

## 2012-06-16 DIAGNOSIS — R109 Unspecified abdominal pain: Secondary | ICD-10-CM

## 2012-06-16 DIAGNOSIS — K5900 Constipation, unspecified: Secondary | ICD-10-CM

## 2012-06-16 DIAGNOSIS — E7800 Pure hypercholesterolemia, unspecified: Secondary | ICD-10-CM

## 2012-06-16 DIAGNOSIS — D509 Iron deficiency anemia, unspecified: Secondary | ICD-10-CM

## 2012-06-16 DIAGNOSIS — I2789 Other specified pulmonary heart diseases: Secondary | ICD-10-CM

## 2012-06-16 HISTORY — DX: Pure hypercholesterolemia, unspecified: E78.00

## 2012-06-16 LAB — COMPREHENSIVE METABOLIC PANEL
Albumin: 3.3 g/dL — ABNORMAL LOW (ref 3.5–5.2)
CO2: 29 mEq/L (ref 19–32)
Calcium: 9 mg/dL (ref 8.4–10.5)
GFR: 125 mL/min (ref >60.00)
Glucose, Bld: 105 mg/dL — ABNORMAL HIGH (ref 70–99)
Potassium: 4.1 mEq/L (ref 3.5–5.1)
Sodium: 141 mEq/L (ref 135–145)
Total Protein: 7.5 g/dL (ref 6.0–8.3)

## 2012-06-16 LAB — LIPID PANEL: HDL: 52.8 mg/dL (ref >39.00)

## 2012-06-16 LAB — CBC WITH DIFFERENTIAL/PLATELET
Basophils Relative: 0.6 % (ref 0.0–3.0)
Eosinophils Relative: 2.2 % (ref 0.0–5.0)
HCT: 36.4 % (ref 36.0–46.0)
Monocytes Relative: 2.2 % — ABNORMAL LOW (ref 3.0–12.0)
Neutrophils Relative %: 60.1 % (ref 43.0–77.0)
Platelets: 280 10*3/uL (ref 150.0–400.0)
RBC: 4.24 Mil/uL (ref 3.87–5.11)
WBC: 7 10*3/uL (ref 4.5–10.5)

## 2012-06-16 LAB — LIPASE: Lipase: 28 U/L (ref 11.0–59.0)

## 2012-06-16 LAB — AMYLASE: Amylase: 55 U/L (ref 27–131)

## 2012-06-16 MED ORDER — ZOLPIDEM TARTRATE 10 MG PO TABS: ORAL_TABLET | ORAL | Status: DC

## 2012-06-16 NOTE — Progress Notes (Signed)
Subjective:    Patient ID: Jessica Davenport, female    DOB: [birth year], 46 y.o.   MRN: [id number]  Abdominal Pain This is a recurrent problem. The current episode started 1 to 4 weeks ago. The problem occurs intermittently. The problem has been gradually improving. The pain is located in the generalized abdominal region. The pain is at a severity of 1/10. The pain is mild. The quality of the pain is cramping. The abdominal pain does not radiate. Associated symptoms include constipation and nausea. Pertinent negatives include no anorexia, arthralgias, belching, diarrhea, dysuria, fever, flatus, frequency, headaches, hematochezia, hematuria, melena, myalgias, vomiting or weight loss. The pain is relieved by bowel movements and passing flatus. Treatments tried: zofran. The treatment provided moderate relief. Prior diagnostic workup includes GI consult.      Review of Systems  Constitutional: Negative for fever, chills, weight loss, diaphoresis, activity change, appetite change, fatigue and unexpected weight change.  HENT: Negative.   Eyes: Negative.   Respiratory: Negative for cough, chest tightness, shortness of breath, wheezing and stridor.   Cardiovascular: Negative for chest pain, palpitations and leg swelling.  Gastrointestinal: Positive for nausea, abdominal pain and constipation. Negative for vomiting, diarrhea, blood in stool, melena, hematochezia, abdominal distention, anal bleeding, rectal pain, anorexia and flatus.  Genitourinary: Negative for dysuria, urgency, frequency, hematuria, flank pain, decreased urine volume, vaginal bleeding, vaginal discharge, enuresis, difficulty urinating, genital sores, vaginal pain, menstrual problem, pelvic pain and dyspareunia.  Musculoskeletal: Negative for myalgias, back pain, joint swelling, arthralgias and gait problem.  Skin: Negative for color change, pallor, rash and wound.  Neurological: Negative.  Negative for headaches.  Hematological:  Negative for adenopathy. Does not bruise/bleed easily.  Psychiatric/Behavioral: Positive for sleep disturbance (DFA). Negative for suicidal ideas, hallucinations, behavioral problems, confusion, self-injury, dysphoric mood, decreased concentration and agitation. The patient is not nervous/anxious and is not hyperactive.        Objective:   Physical Exam  Constitutional: She is oriented to person, place, and time. She appears well-developed and well-nourished.  Non-toxic appearance. She does not have a sickly appearance. She does not appear ill. No distress.  HENT:  Head: Normocephalic and atraumatic.  Mouth/Throat: Oropharynx is clear and moist. No oropharyngeal exudate.  Eyes: Conjunctivae normal are normal. Right eye exhibits no discharge. Left eye exhibits no discharge. No scleral icterus.  Neck: Normal range of motion. Neck supple. No JVD present. No tracheal deviation present. No thyromegaly present.  Cardiovascular: Normal rate, regular rhythm, normal heart sounds and intact distal pulses.  Exam reveals no gallop and no friction rub.   No murmur heard. Pulmonary/Chest: Effort normal and breath sounds normal. No stridor. No respiratory distress. She has no wheezes. She has no rales. She exhibits no tenderness.  Abdominal: Soft. Bowel sounds are normal. She exhibits no distension and no mass. There is no tenderness. There is no rebound and no guarding.  Musculoskeletal: Normal range of motion. She exhibits no edema and no tenderness.  Lymphadenopathy:    She has no cervical adenopathy.  Neurological: She is oriented to person, place, and time.  Skin: Skin is warm and dry. No rash noted. She is not diaphoretic. No erythema. No pallor.  Psychiatric: She has a normal mood and affect. Her speech is normal and behavior is normal. Judgment and thought content normal. Cognition and memory are normal.     Lab Results  Component Value Date   WBC 6.9 06/07/2012   HGB 11.9* 06/07/2012   HCT  37.0 06/07/2012   PLT  322.0 06/07/2012   GLUCOSE 124* 06/01/2012   ALT 9 06/01/2012   AST 18 06/01/2012   NA 132* 06/01/2012   K 4.1 06/01/2012   CL 99 06/01/2012   CREATININE 0.71 06/01/2012   BUN 11 06/01/2012   CO2 25 06/01/2012   TSH 0.77 04/06/2010   HGBA1C 5.8 05/19/2012       Assessment & Plan:

## 2012-06-16 NOTE — Telephone Encounter (Signed)
Patient requesting refill on ambien. Thanks

## 2012-06-16 NOTE — Patient Instructions (Signed)
Abdominal Pain  Abdominal pain can be caused by many things. Your caregiver decides the seriousness of your pain by an examination and possibly blood tests and X-rays. Many cases can be observed and treated at home. Most abdominal pain is not caused by a disease and will probably improve without treatment. However, in many cases, more time must pass before a clear cause of the pain can be found. Before that point, it may not be known if you need more testing, or if hospitalization or surgery is needed.  HOME CARE INSTRUCTIONS   · Do not take laxatives unless directed by your caregiver.  · Take pain medicine only as directed by your caregiver.  · Only take over-the-counter or prescription medicines for pain, discomfort, or fever as directed by your caregiver.  · Try a clear liquid diet (broth, tea, or water) for as long as directed by your caregiver. Slowly move to a bland diet as tolerated.  SEEK IMMEDIATE MEDICAL CARE IF:   · The pain does not go away.  · You have a fever.  · You keep throwing up (vomiting).  · The pain is felt only in portions of the abdomen. Pain in the right side could possibly be appendicitis. In an adult, pain in the left lower portion of the abdomen could be colitis or diverticulitis.  · You pass bloody or black tarry stools.  MAKE SURE YOU:   · Understand these instructions.  · Will watch your condition.  · Will get help right away if you are not doing well or get worse.  Document Released: 04/14/2005 Document Revised: 09/27/2011 Document Reviewed: 02/21/2008  [employer]® Patient Information ©2013 [employer].

## 2012-06-16 NOTE — Assessment & Plan Note (Signed)
There has been some concern that she may have an ileus or SBO so I will check her plain films today, will also check her CBC to see if there is any concern for infection or anemia

## 2012-06-16 NOTE — Assessment & Plan Note (Signed)
She will stop the cough med, I will check her TSH and lytes today, will address as needed

## 2012-06-16 NOTE — Assessment & Plan Note (Signed)
Her BP is well controlled, I will check her lytes and renal function 

## 2012-06-16 NOTE — Assessment & Plan Note (Signed)
FLP TSH and CMP today 

## 2012-06-30 ENCOUNTER — Ambulatory Visit (INDEPENDENT_AMBULATORY_CARE_PROVIDER_SITE_OTHER): Payer: 59 | Admitting: Internal Medicine

## 2012-06-30 ENCOUNTER — Encounter: Payer: Self-pay | Admitting: Internal Medicine

## 2012-06-30 VITALS — BP 120/80 | HR 84 | Temp 98.4°F | Resp 16 | Wt 289.0 lb

## 2012-06-30 DIAGNOSIS — K5900 Constipation, unspecified: Secondary | ICD-10-CM

## 2012-06-30 NOTE — Patient Instructions (Signed)

## 2012-06-30 NOTE — Assessment & Plan Note (Signed)
Improvement noted She feels and looks well today

## 2012-06-30 NOTE — Progress Notes (Signed)
Subjective:    Patient ID: Jessica Davenport, female    DOB: 1965-08-16, 46 y.o.   MRN: [id number]  Constipation This is a chronic problem. The current episode started 1 to 4 weeks ago. The problem has been resolved since onset. Her stool frequency is 1 time per day. The stool is described as firm. Pertinent negatives include no abdominal pain, anorexia, back pain, bloating, diarrhea, difficulty urinating, fecal incontinence, fever, flatus, hematochezia, hemorrhoids, melena, nausea, rectal pain, vomiting or weight loss. Risk factors include obesity and stress. She has tried laxatives for the symptoms. The treatment provided significant relief. Her past medical history is significant for abdominal surgery.      Review of Systems  Constitutional: Positive for unexpected weight change (some weight gain). Negative for fever, chills, weight loss, diaphoresis, activity change, appetite change and fatigue.  HENT: Negative.   Eyes: Negative.   Respiratory: Negative for cough, chest tightness, shortness of breath, wheezing and stridor.   Cardiovascular: Negative for chest pain, palpitations and leg swelling.  Gastrointestinal: Positive for constipation. Negative for nausea, vomiting, abdominal pain, diarrhea, melena, hematochezia, rectal pain, bloating, anorexia, flatus and hemorrhoids.  Genitourinary: Negative.  Negative for difficulty urinating.  Musculoskeletal: Negative for myalgias, back pain, joint swelling, arthralgias and gait problem.  Skin: Negative.   Neurological: Negative.   Hematological: Negative for adenopathy. Does not bruise/bleed easily.  Psychiatric/Behavioral: Negative.        Objective:   Physical Exam  Constitutional: She is oriented to person, place, and time. She appears well-developed and well-nourished.  Non-toxic appearance. She does not have a sickly appearance. She does not appear ill. No distress.  HENT:  Head: Normocephalic and atraumatic.  Mouth/Throat:  Oropharynx is clear and moist. No oropharyngeal exudate.  Eyes: Conjunctivae normal are normal. Right eye exhibits no discharge. Left eye exhibits no discharge. No scleral icterus.  Neck: Normal range of motion. Neck supple. No JVD present. No tracheal deviation present. No thyromegaly present.  Cardiovascular: Normal rate, regular rhythm, normal heart sounds and intact distal pulses.  Exam reveals no gallop and no friction rub.   No murmur heard. Pulmonary/Chest: Effort normal and breath sounds normal. No stridor. No respiratory distress. She has no wheezes. She has no rales. She exhibits no tenderness.  Abdominal: Soft. Bowel sounds are normal. She exhibits no distension and no mass. There is no tenderness. There is no rebound and no guarding.  Musculoskeletal: Normal range of motion. She exhibits no edema and no tenderness.  Lymphadenopathy:    She has no cervical adenopathy.  Neurological: She is oriented to person, place, and time.  Skin: Skin is warm and dry. No rash noted. She is not diaphoretic. No erythema. No pallor.  Psychiatric: She has a normal mood and affect. Her behavior is normal. Judgment and thought content normal.      Lab Results  Component Value Date   WBC 7.0 06/16/2012   HGB 11.6* 06/16/2012   HCT 36.4 06/16/2012   PLT 280.0 06/16/2012   GLUCOSE 105* 06/16/2012   CHOL 211* 06/16/2012   TRIG 92.0 06/16/2012   HDL 52.80 06/16/2012   LDLDIRECT 148.1 06/16/2012   ALT 13 06/16/2012   AST 13 06/16/2012   NA 141 06/16/2012   K 4.1 06/16/2012   CL 105 06/16/2012   CREATININE 0.7 06/16/2012   BUN 11 06/16/2012   CO2 29 06/16/2012   TSH 0.68 06/16/2012   HGBA1C 5.8 05/19/2012     Dg Abd Acute W/chest  06/16/2012  *RADIOLOGY REPORT*  Clinical Data: Nausea and upper abdominal pain.  ACUTE ABDOMEN SERIES (ABDOMEN 2 VIEW & CHEST 1 VIEW)  Comparison: 06/07/2012  Findings: Stable appearance of the heart and mediastinum.  The lungs remain clear without focal airspace  disease.  There is a nonspecific bowel gas pattern.  Moderate amount of stool in the right lower quadrant.  Stable phleboliths in the left hemi pelvis. There is gas within the colon.  IMPRESSION: No acute chest findings.  Nonspecific bowel gas pattern.  Moderate amount of stool in the right lower quadrant.   Original Report Authenticated By: Richarda Overlie, M.D.   Assessment & Plan:

## 2012-11-17 ENCOUNTER — Encounter: Payer: Self-pay | Admitting: Internal Medicine

## 2012-11-17 ENCOUNTER — Ambulatory Visit (INDEPENDENT_AMBULATORY_CARE_PROVIDER_SITE_OTHER): Payer: 59 | Admitting: Internal Medicine

## 2012-11-17 ENCOUNTER — Other Ambulatory Visit: Payer: Self-pay | Admitting: Internal Medicine

## 2012-11-17 VITALS — BP 110/82 | HR 76 | Temp 98.0°F | Resp 16 | Wt 297.0 lb

## 2012-11-17 DIAGNOSIS — J209 Acute bronchitis, unspecified: Secondary | ICD-10-CM

## 2012-11-17 MED ORDER — AZITHROMYCIN 500 MG PO TABS: 500.0000 mg | ORAL_TABLET | Freq: Every day | ORAL | Status: DC

## 2012-11-17 MED ORDER — HYDROCODONE-HOMATROPINE 5-1.5 MG/5ML PO SYRP: 5.0000 mL | ORAL_SOLUTION | Freq: Four times a day (QID) | ORAL | Status: DC | PRN

## 2012-11-17 NOTE — Patient Instructions (Signed)

## 2012-11-17 NOTE — Progress Notes (Signed)
Subjective:    Patient ID: Jessica Davenport, female    DOB: 03-25-1966, 47 y.o.   MRN: [id number]  Cough This is a new problem. The current episode started in the past 7 days. The problem has been unchanged. The problem occurs every few hours. The cough is productive of purulent sputum. Associated symptoms include chills and a sore throat. Pertinent negatives include no chest pain, ear congestion, ear pain, fever, headaches, heartburn, hemoptysis, myalgias, nasal congestion, postnasal drip, rash, rhinorrhea, shortness of breath, sweats, weight loss or wheezing. She has tried nothing for the symptoms. The treatment provided no relief. There is no history of asthma, bronchiectasis, bronchitis, COPD, emphysema, environmental allergies or pneumonia.      Review of Systems  Constitutional: Positive for chills. Negative for fever, weight loss, diaphoresis, activity change, appetite change, fatigue and unexpected weight change.  HENT: Positive for sore throat. Negative for ear pain, congestion, rhinorrhea, sneezing, mouth sores, trouble swallowing, voice change, postnasal drip and sinus pressure.   Eyes: Negative.   Respiratory: Positive for cough. Negative for hemoptysis, shortness of breath and wheezing.   Cardiovascular: Negative.  Negative for chest pain, palpitations and leg swelling.  Gastrointestinal: Negative.  Negative for heartburn, nausea, vomiting, abdominal pain, diarrhea and constipation.  Endocrine: Negative.   Genitourinary: Negative.   Musculoskeletal: Negative.  Negative for myalgias.  Skin: Negative.  Negative for rash.  Allergic/Immunologic: Negative.  Negative for environmental allergies.  Neurological: Negative.  Negative for dizziness, tremors, weakness, light-headedness and headaches.  Hematological: Negative.  Negative for adenopathy. Does not bruise/bleed easily.  Psychiatric/Behavioral: Negative.        Objective:   Physical Exam  Vitals reviewed. Constitutional:  She is oriented to person, place, and time. She appears well-developed and well-nourished.  Non-toxic appearance. She does not have a sickly appearance. She does not appear ill. No distress.  HENT:  Head: Normocephalic and atraumatic.  Mouth/Throat: Oropharynx is clear and moist. No oropharyngeal exudate.  Eyes: Conjunctivae are normal. Right eye exhibits no discharge. Left eye exhibits no discharge. No scleral icterus.  Neck: Normal range of motion. Neck supple. No JVD present. No tracheal deviation present. No thyromegaly present.  Cardiovascular: Normal rate, regular rhythm, normal heart sounds and intact distal pulses.  Exam reveals no gallop and no friction rub.   No murmur heard. Pulmonary/Chest: Effort normal and breath sounds normal. No stridor. No respiratory distress. She has no wheezes. She has no rales. She exhibits no tenderness.  Abdominal: Soft. Bowel sounds are normal. She exhibits no distension and no mass. There is no tenderness. There is no rebound and no guarding.  Musculoskeletal: Normal range of motion. She exhibits no edema and no tenderness.  Lymphadenopathy:    She has no cervical adenopathy.  Neurological: She is oriented to person, place, and time.  Skin: Skin is warm and dry. No rash noted. She is not diaphoretic. No erythema. No pallor.  Psychiatric: She has a normal mood and affect. Her behavior is normal. Judgment and thought content normal.      Lab Results  Component Value Date   WBC 7.0 06/16/2012   HGB 11.6* 06/16/2012   HCT 36.4 06/16/2012   PLT 280.0 06/16/2012   GLUCOSE 105* 06/16/2012   CHOL 211* 06/16/2012   TRIG 92.0 06/16/2012   HDL 52.80 06/16/2012   LDLDIRECT 148.1 06/16/2012   ALT 13 06/16/2012   AST 13 06/16/2012   NA 141 06/16/2012   K 4.1 06/16/2012   CL 105 06/16/2012   CREATININE  0.7 06/16/2012   BUN 11 06/16/2012   CO2 29 06/16/2012   TSH 0.68 06/16/2012   HGBA1C 5.8 05/19/2012      Assessment & Plan:

## 2012-11-17 NOTE — Assessment & Plan Note (Signed)
She will start zpak for the infection and a cough suppressant

## 2012-11-22 ENCOUNTER — Telehealth: Payer: Self-pay | Admitting: *Deleted

## 2012-11-22 NOTE — Telephone Encounter (Signed)
Pt informed of MD's advisement-transferred to scheduler to set up appointment. Pt hung up, per Harriett Sine will callback to schedule appointment.

## 2012-11-22 NOTE — Telephone Encounter (Signed)
She needs to be rechecked, have her come in today

## 2012-11-22 NOTE — Telephone Encounter (Signed)
Pt was seen on 5/2 for bronchitis and given Hycodan cough syrup and ABX. Pt is still having cough and chest pain from cough, is winded and has HA.  Pt is asking for MD's advisement on whether she needs additional medications.

## 2012-12-08 ENCOUNTER — Ambulatory Visit: Payer: 59 | Admitting: Internal Medicine

## 2012-12-08 ENCOUNTER — Encounter: Payer: Self-pay | Admitting: Nurse Practitioner

## 2012-12-08 ENCOUNTER — Ambulatory Visit (INDEPENDENT_AMBULATORY_CARE_PROVIDER_SITE_OTHER): Payer: 59 | Admitting: Nurse Practitioner

## 2012-12-08 ENCOUNTER — Other Ambulatory Visit: Payer: Self-pay

## 2012-12-08 VITALS — BP 138/95 | HR 68 | Ht 67.0 in | Wt 294.0 lb

## 2012-12-08 DIAGNOSIS — R609 Edema, unspecified: Secondary | ICD-10-CM

## 2012-12-08 DIAGNOSIS — E7800 Pure hypercholesterolemia, unspecified: Secondary | ICD-10-CM

## 2012-12-08 LAB — BASIC METABOLIC PANEL
BUN: 10 mg/dL (ref 6–23)
CO2: 29 mEq/L (ref 19–32)
Calcium: 9.3 mg/dL (ref 8.4–10.5)
Chloride: 103 mEq/L (ref 96–112)
Creatinine, Ser: 0.8 mg/dL (ref 0.4–1.2)
GFR: 101.36 mL/min (ref >60.00)
Glucose, Bld: 84 mg/dL (ref 70–99)
Potassium: 3.6 mEq/L (ref 3.5–5.1)
Sodium: 139 mEq/L (ref 135–145)

## 2012-12-08 MED ORDER — FUROSEMIDE 40 MG PO TABS: 40.0000 mg | ORAL_TABLET | Freq: Every day | ORAL | Status: DC

## 2012-12-08 NOTE — Progress Notes (Signed)
Jessica Davenport Stable Date of Birth: 08/16/1965 Medical Record #[id number]  History of Present Illness: Jessica Davenport is seen back today for a follow up visit. Seen for Dr. Daleen Davenport. She is a 47 year old female who has pulmonary HTN, morbid obesity and past secundum ASD repair. Last echo was in 2010.   She was last seen here in September of 2013 for some atypical chest pain. Had her echo updated. This was normal. Due back in May for follow up.  Comes in today. She is here alone. Doing ok. Notes more swelling in a general fashion. Does go down some over night but not completely. Weight is up. Probably salt related. No chest pain. More constipated and just now getting back on her fiber/laxatives. Wanting to lose weight and be more active.    Current Outpatient Prescriptions on File Prior to Visit  Medication Sig Dispense Refill  . aspirin 81 MG tablet Take 81 mg by mouth daily.      . Glucosamine HCl 1000 MG TABS Take by mouth daily.        Marland Kitchen HYDROcodone-homatropine (HYCODAN) 5-1.5 MG/5ML syrup Take 5 mLs by mouth every 6 (six) hours as needed for cough. For bronchitis  120 mL  0  . zolpidem (AMBIEN) 10 MG tablet TAKE 1 TABLET BY MOUTH AT BEDTIME AS NEEDED FOR INSOMNIA  30 tablet  5  . omeprazole (PRILOSEC OTC) 20 MG tablet Take 80 mg by mouth daily.       . ondansetron (ZOFRAN ODT) 8 MG disintegrating tablet Take 1 tablet (8 mg total) by mouth every 8 (eight) hours as needed for nausea.  20 tablet  0   No current facility-administered medications on file prior to visit.    Allergies  Allergen Reactions  . Other     Celery and green peas    Past Medical History  Diagnosis Date  . Allergy   . Anemia   . Lumbar disc disease   . Vitamin D deficiency   . 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 Pulmonary hypertension   . Morbid obesity   . Fibroid, uterine   . Abscess   . Infected sebaceous cyst     mons pubis  . Small bowel obstruction 2013    Past Surgical History   Procedure Laterality Date  . Tubal ligation  1996  . Uterine ablation    . Abdominal hysterectomy  12/07/11  . Asd repair  2005/2009  . Endometrial ablation  2010,2011,2012    History  Smoking status  . Never Smoker   Smokeless tobacco  . Never Used    History  Alcohol Use No    Family History  Problem Relation Age of Onset  . Coronary artery disease Mother   . Hypertension Mother   . Diabetes Mother   . Breast cancer Mother   . Ovarian cancer Maternal Aunt   . Coronary artery disease Father   . Hypertension Father   . Diabetes Father   . Anxiety disorder Sister   . Breast cancer Maternal Aunt   . Colon cancer Maternal Uncle   . Cervical cancer Maternal Aunt     Review of Systems: The review of systems is per the HPI.  All other systems were reviewed and are negative.  Physical Exam: BP 138/95  Pulse 68  Ht 5\' 7"  (1.702 m)  Wt 294 lb (133.358 kg)  BMI 46.04 kg/m2  LMP 11/10/2011 Patient is very pleasant and in  no acute distress. She is morbidly obese. Weight is up 8 pounds. Skin is warm and dry. Color is normal.  HEENT is unremarkable. Normocephalic/atraumatic. PERRL. Sclera are nonicteric. Neck is supple. No masses. No JVD. Lungs are clear. Cardiac exam shows a regular rate and rhythm. Abdomen is soft. Extremities are with 1+ edema. Gait and ROM are intact. No gross neurologic deficits noted.  LABORATORY DATA: BMET is pending  Lab Results  Component Value Date   WBC 7.0 06/16/2012   HGB 11.6* 06/16/2012   HCT 36.4 06/16/2012   PLT 280.0 06/16/2012   GLUCOSE 105* 06/16/2012   CHOL 211* 06/16/2012   TRIG 92.0 06/16/2012   HDL 52.80 06/16/2012   LDLDIRECT 148.1 06/16/2012   ALT 13 06/16/2012   AST 13 06/16/2012   NA 141 06/16/2012   K 4.1 06/16/2012   CL 105 06/16/2012   CREATININE 0.7 06/16/2012   BUN 11 06/16/2012   CO2 29 06/16/2012   TSH 0.68 06/16/2012   HGBA1C 5.8 05/19/2012   Echo Study Conclusions from September 2013  Left ventricle: The  cavity size was normal. Wall thickness was normal. Systolic function was normal. The estimated ejection fraction was in the range of 60% to 65%.   Assessment / Plan: 1. Prior ASD repair - negative echo back in the fall  2. HTN - recheck of her BP is 140/90 by me. I have asked her to monitor at home.   3. Morbid obesity - weight is up  4. Edema - I have increased the Lasix to 40 mg a day. Check BMET today. See her back in about 3 weeks.  She will need to be reassigned a cardiologist on her return visit.   Patient is agreeable to this plan and will call if any problems develop in the interim.   Rosalio Macadamia, RN, ANP-C [facility] [address] [place], [place]  [address]

## 2012-12-08 NOTE — Patient Instructions (Addendum)
Avoid salt  We need to check lab today  I am increasing your Lasix to 40 mg a day  I will see you back in 3 weeks with repeat labs  Here are my tips to lose weight:  1. Drink only water. You do not need milk, juice, tea, soda or diet soda.  2. Do not eat anything "white". This includes white bread, potatoes, rice or mayo  3. Stay away from fried foods and sweets  4. Your portion should be the size of the palm of your hand.  5. Know what your weaknesses are and avoid.  6. Find an exercise you like and do it every day for 45 to 60 minutes.       Call the [facility] office at [phone] if you have any questions, problems or concerns.

## 2013-01-01 ENCOUNTER — Ambulatory Visit: Payer: 59 | Admitting: Nurse Practitioner

## 2013-04-24 DIAGNOSIS — Q211 Atrial septal defect, unspecified: Secondary | ICD-10-CM | POA: Insufficient documentation

## 2013-04-24 DIAGNOSIS — R600 Localized edema: Secondary | ICD-10-CM | POA: Insufficient documentation

## 2013-04-30 ENCOUNTER — Encounter: Payer: Self-pay | Admitting: Nurse Practitioner

## 2013-04-30 ENCOUNTER — Ambulatory Visit (INDEPENDENT_AMBULATORY_CARE_PROVIDER_SITE_OTHER): Payer: 59 | Admitting: Nurse Practitioner

## 2013-04-30 VITALS — BP 128/78 | HR 86 | Ht 67.0 in | Wt 288.6 lb

## 2013-04-30 DIAGNOSIS — Q211 Atrial septal defect: Secondary | ICD-10-CM

## 2013-04-30 DIAGNOSIS — Q2111 Secundum atrial septal defect: Secondary | ICD-10-CM

## 2013-04-30 NOTE — Progress Notes (Signed)
Jessica Davenport Stable Date of Birth: 1966/05/20 Medical Record #[id number]  History of Present Illness: Ms. Comp is seen back today for a follow up visit. Former patient's of Dr. Daleen Squibb. She has pulmonary HTN, morbid obesity and past secundum ASD repair. Last echo was in 2013 and showed normal LV function.   Seen by me last May with edema and weight gain. Did not keep her follow up.   Comes back today. Here alone. Doing ok but very tearful - her father died a month ago. Had CHF with multi system organ failure - age 47. She is doing ok. Still has issues with swelling. Not short of breath. Seems more committed to try and make changes now that her father has passed. Was at [facility] last week for her ASD follow up - this was ok per her report.   Current Outpatient Prescriptions  Medication Sig Dispense Refill  . aspirin 81 MG tablet Take 81 mg by mouth daily.      . furosemide (LASIX) 40 MG tablet Take 1 tablet (40 mg total) by mouth daily.  90 tablet  3  . Glucosamine HCl 1000 MG TABS Take by mouth daily.        Marland Kitchen omeprazole (PRILOSEC OTC) 20 MG tablet Take 80 mg by mouth daily.       Marland Kitchen zolpidem (AMBIEN) 10 MG tablet TAKE 1 TABLET BY MOUTH AT BEDTIME AS NEEDED FOR INSOMNIA  30 tablet  5   No current facility-administered medications for this visit.    Allergies  Allergen Reactions  . Other     Celery and green peas    Past Medical History  Diagnosis Date  . Allergy   . Anemia   . Lumbar disc disease   . Vitamin D deficiency   . 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 Pulmonary hypertension   . Morbid obesity   . Fibroid, uterine   . Abscess   . Infected sebaceous cyst     mons pubis  . Small bowel obstruction 2013    Past Surgical History  Procedure Laterality Date  . Tubal ligation  1996  . Uterine ablation    . Abdominal hysterectomy  12/07/11  . Asd repair  2005/2009  . Endometrial ablation  2010,2011,2012    History  Smoking status  . Never  Smoker   Smokeless tobacco  . Never Used    History  Alcohol Use No    Family History  Problem Relation Age of Onset  . Coronary artery disease Mother   . Hypertension Mother   . Diabetes Mother   . Breast cancer Mother   . Ovarian cancer Maternal Aunt   . Coronary artery disease Father   . Hypertension Father   . Diabetes Father   . Anxiety disorder Sister   . Breast cancer Maternal Aunt   . Colon cancer Maternal Uncle   . Cervical cancer Maternal Aunt     Review of Systems: The review of systems is per the HPI.  All other systems were reviewed and are negative.  Physical Exam: BP 128/78  Pulse 86  Ht 5\' 7"  (1.702 m)  Wt 288 lb 9.6 oz (130.908 kg)  BMI 45.19 kg/m2  LMP 11/10/2011 Patient is very pleasant and in no acute distress. Skin is warm and dry. Color is normal.  HEENT is unremarkable. Normocephalic/atraumatic. PERRL. Sclera are nonicteric. Neck is supple. No masses. No JVD. Lungs are clear. Cardiac exam shows a regular  rate and rhythm. Abdomen is soft. Extremities are without edema. Gait and ROM are intact. No gross neurologic deficits noted.  LABORATORY DATA:  Lab Results  Component Value Date   WBC 7.0 06/16/2012   HGB 11.6* 06/16/2012   HCT 36.4 06/16/2012   PLT 280.0 06/16/2012   GLUCOSE 84 12/08/2012   CHOL 211* 06/16/2012   TRIG 92.0 06/16/2012   HDL 52.80 06/16/2012   LDLDIRECT 148.1 06/16/2012   ALT 13 06/16/2012   AST 13 06/16/2012   NA 139 12/08/2012   K 3.6 12/08/2012   CL 103 12/08/2012   CREATININE 0.8 12/08/2012   BUN 10 12/08/2012   CO2 29 12/08/2012   TSH 0.68 06/16/2012   HGBA1C 5.8 05/19/2012    Echo Study Conclusions from September 2013  Left ventricle: The cavity size was normal. Wall thickness was normal. Systolic function was normal. The estimated ejection fraction was in the range of 60% to 65%.    Assessment / Plan:  1. Pulmonary HTN   2. Morbid obesity - this seems to be more of the issue for her - discussed in depth  today.   3. Past ASD repair - last echo in September of 2013 but just seen at [facility] - reportedly stable.   Will see her back in 4 months. Discussed need for salt restriction and regular exercise - she seems motivated to make changes.   Patient is agreeable to this plan and will call if any problems develop in the interim.   Rosalio Macadamia, RN, ANP-C [facility] [address] [place], [place]  [address]

## 2013-04-30 NOTE — Patient Instructions (Signed)
Stay on your current medicines  Walking every day  No cans or boxed foods  I will see you in 4 months  Call the [facility] office at [phone] if you have any questions, problems or concerns.

## 2013-05-03 ENCOUNTER — Telehealth: Payer: Self-pay

## 2013-05-03 MED ORDER — ZOLPIDEM TARTRATE 10 MG PO TABS: ORAL_TABLET | ORAL | Status: DC

## 2013-05-03 NOTE — Telephone Encounter (Signed)
error 

## 2013-05-03 NOTE — Telephone Encounter (Signed)
Please advise on rx received for zolpidem 10 mg quantity 30. Patient was last seen on 12/05/2012 and medication was last filled on 06/16/2012.  Thanks!

## 2013-05-03 NOTE — Telephone Encounter (Signed)
Refill request was received for Zolpidem 10 mg quantity 30. Patient was last seen on 12/05/2012 and medication was last filled on 11/29/20013. Please advise.   Thanks!

## 2013-05-22 ENCOUNTER — Telehealth: Payer: Self-pay

## 2013-05-22 NOTE — Telephone Encounter (Signed)
Medication was phoned in to [facility] pharmacy.

## 2013-05-24 ENCOUNTER — Other Ambulatory Visit: Payer: Self-pay

## 2013-07-06 ENCOUNTER — Other Ambulatory Visit (HOSPITAL_COMMUNITY): Payer: Self-pay | Admitting: Obstetrics and Gynecology

## 2013-07-06 DIAGNOSIS — Z1231 Encounter for screening mammogram for malignant neoplasm of breast: Secondary | ICD-10-CM

## 2013-07-09 ENCOUNTER — Ambulatory Visit (HOSPITAL_COMMUNITY)
Admission: RE | Admit: 2013-07-09 | Discharge: 2013-07-09 | Disposition: A | Discharge status: Home / Self Care | Payer: 59 | Source: Ambulatory Visit | Attending: Obstetrics and Gynecology | Admitting: Obstetrics and Gynecology

## 2013-07-09 DIAGNOSIS — Z1231 Encounter for screening mammogram for malignant neoplasm of breast: Secondary | ICD-10-CM | POA: Insufficient documentation

## 2013-08-10 ENCOUNTER — Ambulatory Visit (INDEPENDENT_AMBULATORY_CARE_PROVIDER_SITE_OTHER): Payer: 59 | Admitting: Internal Medicine

## 2013-08-10 ENCOUNTER — Encounter: Payer: Self-pay | Admitting: Internal Medicine

## 2013-08-10 VITALS — BP 120/78 | HR 94 | Temp 98.7°F | Resp 16

## 2013-08-10 DIAGNOSIS — L2089 Other atopic dermatitis: Secondary | ICD-10-CM

## 2013-08-10 DIAGNOSIS — J209 Acute bronchitis, unspecified: Secondary | ICD-10-CM

## 2013-08-10 MED ORDER — AZITHROMYCIN 500 MG PO TABS: 500.0000 mg | ORAL_TABLET | Freq: Every day | ORAL | Status: DC

## 2013-08-10 MED ORDER — CLOBETASOL PROPIONATE 0.05 % EX OINT: 1.0000 "application " | TOPICAL_OINTMENT | Freq: Two times a day (BID) | CUTANEOUS | Status: DC

## 2013-08-10 MED ORDER — HYDROCODONE-HOMATROPINE 5-1.5 MG/5ML PO SYRP: 5.0000 mL | ORAL_SOLUTION | Freq: Three times a day (TID) | ORAL | Status: DC | PRN

## 2013-08-10 NOTE — Progress Notes (Signed)
Subjective:    Patient ID: Jessica Davenport, female    DOB: 01-Jul-1966, 48 y.o.   MRN: [id number]  Rash This is a chronic problem. The current episode started more than 1 year ago. The problem has been gradually worsening since onset. The rash is diffuse. The rash is characterized by itchiness. She was exposed to nothing. Associated symptoms include coughing and a sore throat. Pertinent negatives include no anorexia, congestion, diarrhea, eye pain, facial edema, fatigue, fever, joint pain, nail changes, rhinorrhea, shortness of breath or vomiting. Past treatments include nothing. Her past medical history is significant for allergies and eczema. There is no history of asthma or varicella.  Cough This is a new problem. The current episode started in the past 7 days. The problem has been gradually worsening. The problem occurs every few hours. The cough is productive of purulent sputum. Associated symptoms include chills, a rash and a sore throat. Pertinent negatives include no chest pain, ear congestion, ear pain, fever, headaches, heartburn, hemoptysis, myalgias, nasal congestion, postnasal drip, rhinorrhea, shortness of breath, sweats, weight loss or wheezing. She has tried OTC cough suppressant for the symptoms. The treatment provided no relief. There is no history of asthma.      Review of Systems  Constitutional: Positive for chills. Negative for fever, weight loss, diaphoresis, activity change, appetite change and fatigue.  HENT: Positive for sore throat. Negative for congestion, ear pain, postnasal drip, rhinorrhea, trouble swallowing and voice change.   Eyes: Negative.  Negative for pain.  Respiratory: Positive for cough. Negative for apnea, hemoptysis, choking, chest tightness, shortness of breath, wheezing and stridor.   Cardiovascular: Negative.  Negative for chest pain, palpitations and leg swelling.  Gastrointestinal: Negative.  Negative for heartburn, vomiting, abdominal pain,  diarrhea and anorexia.  Endocrine: Negative.   Genitourinary: Negative.   Musculoskeletal: Negative.  Negative for joint pain and myalgias.  Skin: Positive for rash. Negative for nail changes, color change, pallor and wound.  Allergic/Immunologic: Negative.   Neurological: Negative.  Negative for headaches.  Hematological: Negative.  Negative for adenopathy. Does not bruise/bleed easily.  Psychiatric/Behavioral: Negative.        Objective:   Physical Exam  Vitals reviewed. Constitutional: She is oriented to person, place, and time. She appears well-developed and well-nourished.  Non-toxic appearance. She does not have a sickly appearance. She does not appear ill. No distress.  HENT:  Head: Normocephalic and atraumatic.  Mouth/Throat: Oropharynx is clear and moist. No oropharyngeal exudate.  Eyes: Conjunctivae are normal. Right eye exhibits no discharge. Left eye exhibits no discharge. No scleral icterus.  Neck: Normal range of motion. Neck supple. No JVD present. No tracheal deviation present. No thyromegaly present.  Cardiovascular: Normal rate, regular rhythm, normal heart sounds and intact distal pulses.  Exam reveals no gallop and no friction rub.   No murmur heard. Pulmonary/Chest: Effort normal and breath sounds normal. No stridor. No respiratory distress. She has no wheezes. She has no rales. She exhibits no tenderness.  Abdominal: Soft. Bowel sounds are normal. She exhibits no distension and no mass. There is no tenderness. There is no rebound and no guarding.  Musculoskeletal: Normal range of motion. She exhibits no edema and no tenderness.  Lymphadenopathy:    She has no cervical adenopathy.  Neurological: She is oriented to person, place, and time.  Skin: Skin is warm, dry and intact. Rash noted. No abrasion, no bruising, no burn, no ecchymosis, no laceration, no lesion, no petechiae and no purpura noted. Rash is papular.  Rash is not macular, not maculopapular, not nodular,  not pustular, not vesicular and not urticarial. She is not diaphoretic. No erythema. No pallor.  There are diffuse, scattered scaly papules with xerosis.  Psychiatric: She has a normal mood and affect. Her behavior is normal. Judgment and thought content normal.     Lab Results  Component Value Date   WBC 7.0 06/16/2012   HGB 11.6* 06/16/2012   HCT 36.4 06/16/2012   PLT 280.0 06/16/2012   GLUCOSE 84 12/08/2012   CHOL 211* 06/16/2012   TRIG 92.0 06/16/2012   HDL 52.80 06/16/2012   LDLDIRECT 148.1 06/16/2012   ALT 13 06/16/2012   AST 13 06/16/2012   NA 139 12/08/2012   K 3.6 12/08/2012   CL 103 12/08/2012   CREATININE 0.8 12/08/2012   BUN 10 12/08/2012   CO2 29 12/08/2012   TSH 0.68 06/16/2012   HGBA1C 5.8 05/19/2012       Assessment & Plan:

## 2013-08-10 NOTE — Patient Instructions (Signed)
Acute Bronchitis Bronchitis is inflammation of the airways that extend from the windpipe into the lungs (bronchi). The inflammation often causes mucus to develop. This leads to a cough, which is the most common symptom of bronchitis.  In acute bronchitis, the condition usually develops suddenly and goes away over time, usually in a couple weeks. Smoking, allergies, and asthma can make bronchitis worse. Repeated episodes of bronchitis may cause further lung problems.  CAUSES Acute bronchitis is most often caused by the same virus that causes a cold. The virus can spread from person to person (contagious).  SIGNS AND SYMPTOMS   Cough.   Fever.   Coughing up mucus.   Body aches.   Chest congestion.   Chills.   Shortness of breath.   Sore throat.  DIAGNOSIS  Acute bronchitis is usually diagnosed through a physical exam. Tests, such as chest X-rays, are sometimes done to rule out other conditions.  TREATMENT  Acute bronchitis usually goes away in a couple weeks. Often times, no medical treatment is necessary. Medicines are sometimes given for relief of fever or cough. Antibiotics are usually not needed but may be prescribed in certain situations. In some cases, an inhaler may be recommended to help reduce shortness of breath and control the cough. A cool mist vaporizer may also be used to help thin bronchial secretions and make it easier to clear the chest.  HOME CARE INSTRUCTIONS  Get plenty of rest.   Drink enough fluids to keep your urine clear or pale yellow (unless you have a medical condition that requires fluid restriction). Increasing fluids may help thin your secretions and will prevent dehydration.   Only take over-the-counter or prescription medicines as directed by your health care provider.   Avoid smoking and secondhand smoke. Exposure to cigarette smoke or irritating chemicals will make bronchitis worse. If you are a smoker, consider using nicotine gum or skin  patches to help control withdrawal symptoms. Quitting smoking will help your lungs heal faster.   Reduce the chances of another bout of acute bronchitis by washing your hands frequently, avoiding people with cold symptoms, and trying not to touch your hands to your mouth, nose, or eyes.   Follow up with your health care provider as directed.  SEEK MEDICAL CARE IF: Your symptoms do not improve after 1 week of treatment.  SEEK IMMEDIATE MEDICAL CARE IF:  You develop an increased fever or chills.   You have chest pain.   You have severe shortness of breath.  You have bloody sputum.   You develop dehydration.  You develop fainting.  You develop repeated vomiting.  You develop a severe headache. MAKE SURE YOU:   Understand these instructions.  Will watch your condition.  Will get help right away if you are not doing well or get worse. Document Released: 08/12/2004 Document Revised: 03/07/2013 Document Reviewed: 12/26/2012 Exit[employer] Patient Information 2014 [employer].  

## 2013-08-10 NOTE — Progress Notes (Signed)
Pre visit review using our clinic review tool, if applicable. No additional management support is needed unless otherwise documented below in the visit note. 

## 2013-08-11 ENCOUNTER — Encounter: Payer: Self-pay | Admitting: Internal Medicine

## 2013-08-11 MED ORDER — CETIRIZINE HCL 10 MG PO TABS: 10.0000 mg | ORAL_TABLET | Freq: Two times a day (BID) | ORAL | Status: DC

## 2013-08-11 NOTE — Assessment & Plan Note (Signed)
I will treat the infection with zpak and will control the cough with hycodan

## 2013-08-11 NOTE — Assessment & Plan Note (Signed)
I will treat this with topical steroids and zyrtec

## 2013-08-30 ENCOUNTER — Telehealth: Payer: Self-pay

## 2013-08-30 DIAGNOSIS — L2089 Other atopic dermatitis: Secondary | ICD-10-CM

## 2013-08-30 MED ORDER — CLOBETASOL PROPIONATE 0.05 % EX OINT: 1.0000 "application " | TOPICAL_OINTMENT | Freq: Two times a day (BID) | CUTANEOUS | Status: DC

## 2013-08-30 NOTE — Telephone Encounter (Signed)
Spoke with patient who request rx for cream sent to a different pharmacy. Also c/o facial pain and cough, I advised that appointment is needed and pt scheduled.

## 2013-08-30 NOTE — Telephone Encounter (Signed)
The patient called and is hoping to speak with the CMA as soon as possible regarding her medication.

## 2013-08-31 ENCOUNTER — Ambulatory Visit (INDEPENDENT_AMBULATORY_CARE_PROVIDER_SITE_OTHER): Payer: 59 | Admitting: Internal Medicine

## 2013-08-31 ENCOUNTER — Encounter: Payer: Self-pay | Admitting: Internal Medicine

## 2013-08-31 VITALS — BP 118/82 | HR 78 | Temp 99.6°F | Resp 16 | Ht 67.0 in | Wt 302.0 lb

## 2013-08-31 DIAGNOSIS — J019 Acute sinusitis, unspecified: Secondary | ICD-10-CM

## 2013-08-31 DIAGNOSIS — J209 Acute bronchitis, unspecified: Secondary | ICD-10-CM

## 2013-08-31 MED ORDER — HYDROCODONE-HOMATROPINE 5-1.5 MG/5ML PO SYRP: 5.0000 mL | ORAL_SOLUTION | Freq: Three times a day (TID) | ORAL | Status: DC | PRN

## 2013-08-31 MED ORDER — AMOXICILLIN-POT CLAVULANATE 875-125 MG PO TABS: 1.0000 | ORAL_TABLET | Freq: Two times a day (BID) | ORAL | Status: DC

## 2013-08-31 NOTE — Progress Notes (Signed)
Pre visit review using our clinic review tool, if applicable. No additional management support is needed unless otherwise documented below in the visit note. 

## 2013-08-31 NOTE — Progress Notes (Signed)
Subjective:    Patient ID: Jessica Davenport, female    DOB: September 09, 1965, 48 y.o.   MRN: [id number]  Sinusitis This is a recurrent problem. The current episode started 1 to 4 weeks ago. The problem has been gradually worsening since onset. There has been no fever. The fever has been present for less than 1 day. The pain is mild. Associated symptoms include congestion, coughing (mild NP), sinus pressure and a sore throat. Pertinent negatives include no chills, diaphoresis, ear pain, headaches, hoarse voice, neck pain, shortness of breath, sneezing or swollen glands. Past treatments include oral decongestants. The treatment provided mild relief.      Review of Systems  Constitutional: Negative.  Negative for fever, chills, diaphoresis, appetite change and fatigue.  HENT: Positive for congestion, postnasal drip, rhinorrhea, sinus pressure and sore throat. Negative for ear pain, hoarse voice, sneezing and tinnitus.   Eyes: Negative.   Respiratory: Positive for cough (mild NP). Negative for apnea, choking, chest tightness, shortness of breath, wheezing and stridor.   Cardiovascular: Negative.  Negative for chest pain, palpitations and leg swelling.  Gastrointestinal: Negative.  Negative for abdominal pain.  Endocrine: Negative.   Genitourinary: Negative.   Musculoskeletal: Negative.  Negative for neck pain.  Skin: Negative.   Allergic/Immunologic: Negative.  Negative for environmental allergies.  Neurological: Negative.  Negative for dizziness and headaches.  Hematological: Negative.  Negative for adenopathy. Does not bruise/bleed easily.  Psychiatric/Behavioral: Negative.        Objective:   Physical Exam  Vitals reviewed. Constitutional: She is oriented to person, place, and time. She appears well-developed and well-nourished.  Non-toxic appearance. She does not have a sickly appearance. She does not appear ill. No distress.  HENT:  Right Ear: Hearing, tympanic membrane, external ear  and ear canal normal.  Left Ear: Hearing, tympanic membrane, external ear and ear canal normal.  Nose: Mucosal edema and rhinorrhea present. No sinus tenderness. Right sinus exhibits no maxillary sinus tenderness and no frontal sinus tenderness. Left sinus exhibits maxillary sinus tenderness. Left sinus exhibits no frontal sinus tenderness.  Mouth/Throat: Oropharynx is clear and moist and mucous membranes are normal. Mucous membranes are not pale, not dry and not cyanotic. No oral lesions. No trismus in the jaw. No uvula swelling. No oropharyngeal exudate, posterior oropharyngeal edema, posterior oropharyngeal erythema or tonsillar abscesses.  Eyes: Conjunctivae are normal. Right eye exhibits no discharge. Left eye exhibits no discharge. No scleral icterus.  Neck: Normal range of motion. Neck supple. No JVD present. No tracheal deviation present. No thyromegaly present.  Cardiovascular: Normal rate, regular rhythm, normal heart sounds and intact distal pulses.  Exam reveals no gallop and no friction rub.   No murmur heard. Pulmonary/Chest: Effort normal and breath sounds normal. No stridor. No respiratory distress. She has no wheezes. She has no rales. She exhibits no tenderness.  Abdominal: Soft. Bowel sounds are normal. She exhibits no distension and no mass. There is no tenderness. There is no rebound and no guarding.  Musculoskeletal: Normal range of motion. She exhibits no edema and no tenderness.  Lymphadenopathy:    She has no cervical adenopathy.  Neurological: She is oriented to person, place, and time.  Skin: Skin is warm and dry. No rash noted. She is not diaphoretic. No erythema. No pallor.     Lab Results  Component Value Date   WBC 7.0 06/16/2012   HGB 11.6* 06/16/2012   HCT 36.4 06/16/2012   PLT 280.0 06/16/2012   GLUCOSE 84 12/08/2012  CHOL 211* 06/16/2012   TRIG 92.0 06/16/2012   HDL 52.80 06/16/2012   LDLDIRECT 148.1 06/16/2012   ALT 13 06/16/2012   AST 13 06/16/2012     NA 139 12/08/2012   K 3.6 12/08/2012   CL 103 12/08/2012   CREATININE 0.8 12/08/2012   BUN 10 12/08/2012   CO2 29 12/08/2012   TSH 0.68 06/16/2012   HGBA1C 5.8 05/19/2012       Assessment & Plan:

## 2013-08-31 NOTE — Patient Instructions (Signed)

## 2013-09-01 NOTE — Assessment & Plan Note (Signed)
I will treat the infection with augmentin Will control the symptoms with hycodan as needed

## 2013-12-27 ENCOUNTER — Other Ambulatory Visit: Payer: Self-pay | Admitting: Nurse Practitioner

## 2014-01-12 IMAGING — CR DG CHEST 2V
2 series · 2 of 2 positions shown · non-contrast
Comparison: 06/01/2012

CLINICAL DATA: Cough

CHEST - 2 VIEW

[view not recorded (1 of 2)]
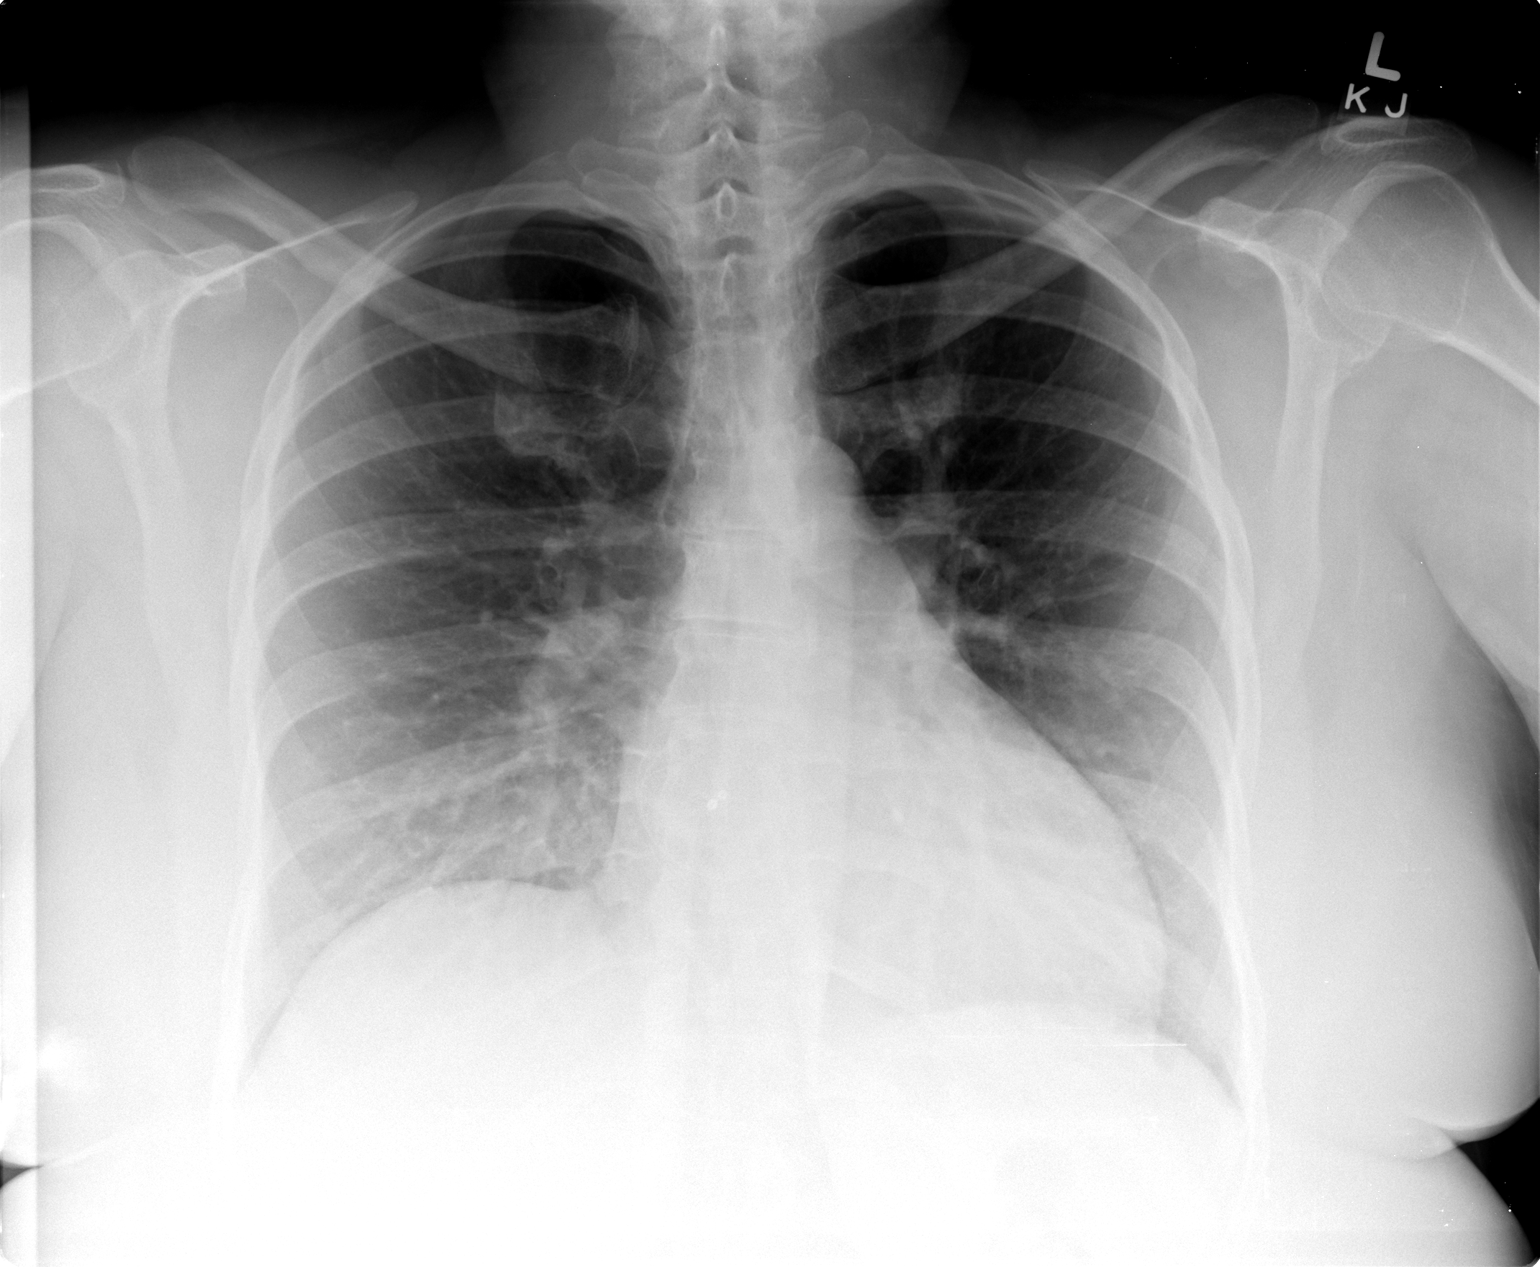

[view not recorded (2 of 2)]
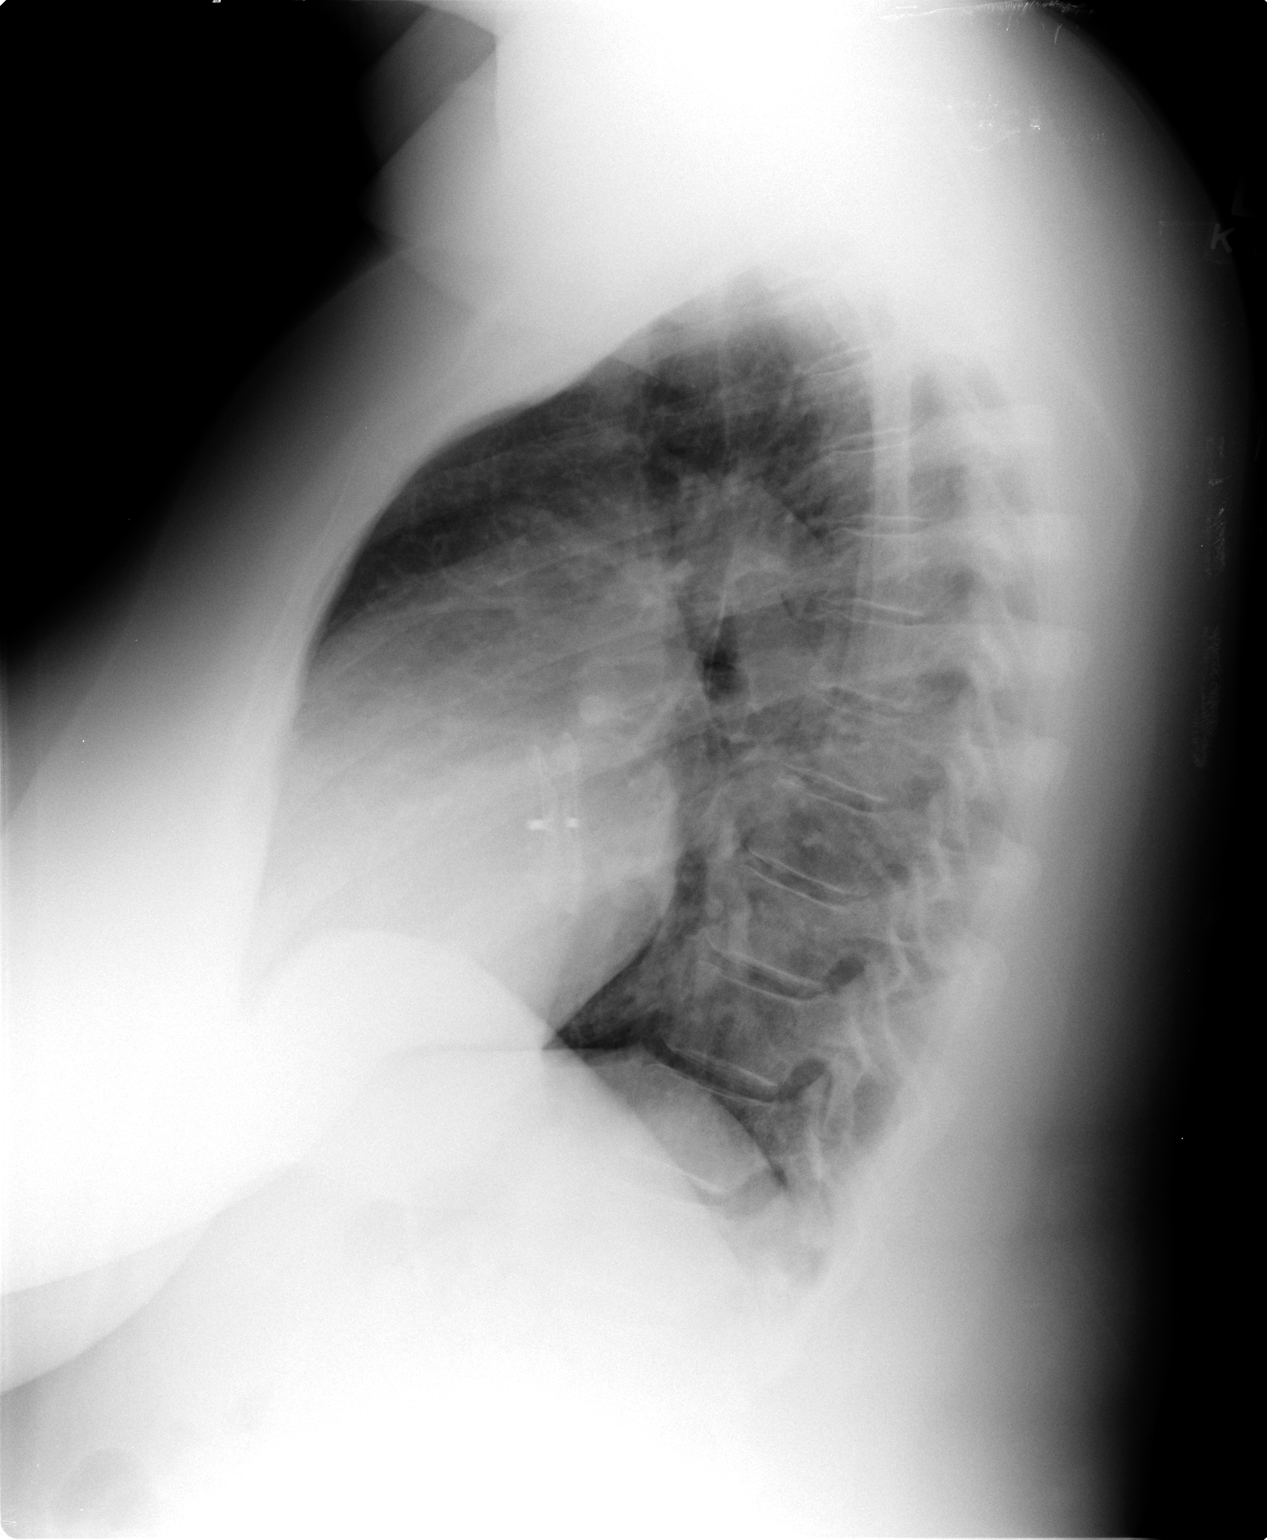

[2 of 2 positions shown; findings below may reference images not displayed]

FINDINGS: The heart and pulmonary vascularity are within normal
limits.  A new atrial septal occlusion device is noted.  The lungs
are clear bilaterally.  No bony abnormality is noted.
IMPRESSION: No acute abnormalities seen.

## 2014-04-23 ENCOUNTER — Ambulatory Visit: Payer: 59 | Admitting: Nurse Practitioner

## 2014-04-26 ENCOUNTER — Ambulatory Visit: Payer: 59 | Admitting: Nurse Practitioner

## 2014-04-29 ENCOUNTER — Ambulatory Visit: Payer: 59 | Admitting: Nurse Practitioner

## 2014-05-01 ENCOUNTER — Ambulatory Visit (INDEPENDENT_AMBULATORY_CARE_PROVIDER_SITE_OTHER): Payer: 59 | Admitting: Nurse Practitioner

## 2014-05-01 ENCOUNTER — Encounter: Payer: Self-pay | Admitting: Nurse Practitioner

## 2014-05-01 VITALS — BP 118/62 | HR 81 | Ht 67.0 in | Wt 306.8 lb

## 2014-05-01 DIAGNOSIS — R609 Edema, unspecified: Secondary | ICD-10-CM

## 2014-05-01 DIAGNOSIS — R0600 Dyspnea, unspecified: Secondary | ICD-10-CM

## 2014-05-01 LAB — LIPID PANEL
Cholesterol: 214 mg/dL — ABNORMAL HIGH (ref 0–200)
HDL: 46.4 mg/dL (ref >39.00)
LDL Cholesterol: 140 mg/dL — ABNORMAL HIGH (ref 0–99)
NonHDL: 167.6
Total CHOL/HDL Ratio: 5
Triglycerides: 139 mg/dL (ref 0.0–149.0)
VLDL: 27.8 mg/dL (ref 0.0–40.0)

## 2014-05-01 LAB — HEPATIC FUNCTION PANEL
ALT: 13 U/L (ref 0–35)
AST: 15 U/L (ref 0–37)
Albumin: 3.5 g/dL (ref 3.5–5.2)
Alkaline Phosphatase: 77 U/L (ref 39–117)
Bilirubin, Direct: 0 mg/dL (ref 0.0–0.3)
Total Bilirubin: 0.3 mg/dL (ref 0.2–1.2)
Total Protein: 8.7 g/dL — ABNORMAL HIGH (ref 6.0–8.3)

## 2014-05-01 LAB — TSH: TSH: 1.01 u[IU]/mL (ref 0.35–4.50)

## 2014-05-01 LAB — BASIC METABOLIC PANEL
BUN: 10 mg/dL (ref 6–23)
CO2: 29 mEq/L (ref 19–32)
Calcium: 9.4 mg/dL (ref 8.4–10.5)
Chloride: 101 mEq/L (ref 96–112)
Creatinine, Ser: 0.8 mg/dL (ref 0.4–1.2)
GFR: 102.22 mL/min (ref >60.00)
Glucose, Bld: 94 mg/dL (ref 70–99)
Potassium: 3.5 mEq/L (ref 3.5–5.1)
Sodium: 136 mEq/L (ref 135–145)

## 2014-05-01 LAB — CBC
HCT: 39.2 % (ref 36.0–46.0)
Hemoglobin: 12.6 g/dL (ref 12.0–15.0)
MCHC: 32.2 g/dL (ref 30.0–36.0)
MCV: 85.9 fl (ref 78.0–100.0)
Platelets: 308 10*3/uL (ref 150.0–400.0)
RBC: 4.56 Mil/uL (ref 3.87–5.11)
RDW: 14.5 % (ref 11.5–15.5)
WBC: 7.6 10*3/uL (ref 4.0–10.5)

## 2014-05-01 LAB — HEMOGLOBIN A1C: Hgb A1c MFr Bld: 6.1 % (ref 4.6–6.5)

## 2014-05-01 MED ORDER — FUROSEMIDE 40 MG PO TABS: ORAL_TABLET | ORAL | Status: DC

## 2014-05-01 NOTE — Progress Notes (Signed)
National Date of Birth: Oct 28, 1965 Medical Record #[id number]  History of Present Illness: Jessica Davenport is seen back today for a follow up visit. This is a one year check.  Former patient's of Dr. Verl Blalock. She has pulmonary HTN, morbid obesity and past secundum ASD repair (followed at [facility]). Last echo was in 2013 and showed normal LV function.   Seen by me in October of 2014.   Comes back today. Here alone. Has had an echo from October of 2014 noted in [place]. EF good. She has gotten way off track with taking care of herself. Weight is up. More swelling. More dyspnea. Eating poorly. No exercise. Limited by back pain. Has ran out of her Lasix and needed an OV to get refilled.   Current Outpatient Prescriptions  Medication Sig Dispense Refill  . aspirin 81 MG tablet Take 81 mg by mouth daily.      . cetirizine (ZYRTEC) 10 MG tablet Take 1 tablet (10 mg total) by mouth 2 (two) times daily.  180 tablet  3  . clobetasol ointment (TEMOVATE) 2.58 % Apply 1 application topically 2 (two) times daily.  45 g  1  . furosemide (LASIX) 40 MG tablet TAKE ONE TABLET BY MOUTH ONCE DAILY  90 tablet  0  . Glucosamine HCl 1000 MG TABS Take by mouth daily.        Marland Kitchen omeprazole (PRILOSEC OTC) 20 MG tablet Take 80 mg by mouth daily.        No current facility-administered medications for this visit.    Allergies  Allergen Reactions  . Other     Celery and green peas    Past Medical History  Diagnosis Date  . Allergy   . Anemia   . Lumbar disc disease   . Vitamin D deficiency   . 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 Pulmonary hypertension   . Morbid obesity   . Fibroid, uterine   . Abscess   . Infected sebaceous cyst     mons pubis  . Small bowel obstruction 2013    Past Surgical History  Procedure Laterality Date  . Tubal ligation  1996  . Uterine ablation    . Abdominal hysterectomy  12/07/11  . Asd repair  2005/2009  . Endometrial ablation   2010,2011,2012    History  Smoking status  . Never Smoker   Smokeless tobacco  . Never Used    History  Alcohol Use No    Family History  Problem Relation Age of Onset  . Coronary artery disease Mother   . Hypertension Mother   . Diabetes Mother   . Breast cancer Mother   . Ovarian cancer Maternal Aunt   . Coronary artery disease Father   . Hypertension Father   . Diabetes Father   . Anxiety disorder Sister   . Breast cancer Maternal Aunt   . Colon cancer Maternal Uncle   . Cervical cancer Maternal Aunt     Review of Systems: The review of systems is per the HPI.  All other systems were reviewed and are negative.  Physical Exam: BP 118/62  Pulse 81  Ht 5\' 7"  (1.702 m)  Wt 306 lb 12.8 oz (139.164 kg)  BMI 48.04 kg/m2  SpO2 99%  LMP 11/10/2011 Patient is very pleasant and in no acute distress. She is obese. Weight is up 18 pounds over the past year. Skin is warm and dry. Color is normal.  HEENT  is unremarkable but has poor dentition with lots of missing teeth. Normocephalic/atraumatic. PERRL. Sclera are nonicteric. Neck is supple. No masses. No JVD. Lungs are clear. Cardiac exam shows a regular rate and rhythm. Abdomen is soft. Extremities are without edema. Gait and ROM are intact. No gross neurologic deficits noted.  Wt Readings from Last 3 Encounters:  05/01/14 306 lb 12.8 oz (139.164 kg)  08/31/13 302 lb (136.986 kg)  04/30/13 288 lb 9.6 oz (130.908 kg)    LABORATORY DATA/PROCEDURES: PENDING   Lab Results  Component Value Date   WBC 7.0 06/16/2012   HGB 11.6* 06/16/2012   HCT 36.4 06/16/2012   PLT 280.0 06/16/2012   GLUCOSE 84 12/08/2012   CHOL 211* 06/16/2012   TRIG 92.0 06/16/2012   HDL 52.80 06/16/2012   LDLDIRECT 148.1 06/16/2012   ALT 13 06/16/2012   AST 13 06/16/2012   NA 139 12/08/2012   K 3.6 12/08/2012   CL 103 12/08/2012   CREATININE 0.8 12/08/2012   BUN 10 12/08/2012   CO2 29 12/08/2012   TSH 0.68 06/16/2012   HGBA1C 5.8 05/19/2012    BNP  (last 3 results) No results found for this basename: PROBNP,  in the last 8760 hours  ECHO from October of 2014 at [facility] HISTORY: ASD REASON: Assess LV function Assess RV function INDICATION: 745.5 - Ostium secundum type atrial septal defect.  ECHOCARDIOGRAPHIC MEASUREMENTS ----------------------------------------------- 2D DIMENSIONS AORTA Values Normal Range MAIN PA Values Normal Range Annulus: nm* cm [2.1 - 2.5] PA Main: nm* cm [1.5 - 2.1] Aorta Sin: nm* cm [2.7 - 3.3] RIGHT VENTRICLE ST Junction: nm* cm [2.3 - 2.9] RV Base: nm* cm [<= 4.2] Asc.Aorta: nm* cm [2.3 - 3.1] RV Mid: nm* cm [<= 3.5] LEFT VENTRICLE RV Length: nm* cm [<= 8.6] LVIDd: 4.9 cm [3.9 - 5.3] INFERIOR VENA CAVA LVIDs: 3.0 cm Max.IVC: nm* cm [<= 2.1] FS: 39% [> 25%] Min.IVC: nm* cm [<= 1.7] SWT: 0.9 cm [0.6 - 1.0] __________________ PWT: 0.9 cm [0.6 - 1.0] nm* - not measured LEFT ATRIUM LA Diam: 3.4 cm [2.7 - 3.8] LA Area: nm* cm2 [< 20] LA Volume: nm* mL [22 - 52]  ECHOCARDIOGRAPHIC DESCRIPTIONS ----------------------------------------------- AORTIC ROOT Size: Not seen Dissection: INDETERM FOR DISSECTION  AORTIC VALVE Leaflets: Tricuspid Morphology: Normal Mobility: Fully Mobile  LEFT VENTRICLE Anterior: Normal Size: Normal Lateral: Normal Contraction: Normal Septal: Normal Closest EF: >55% (Estimated) Apical: Normal LV masses: No Masses Inferior: Normal LVH: None Posterior: Normal Dias.FxClass: Normal  MITRAL VALVE Leaflets: Normal Mobility: Fully mobile Morphology: Normal  LEFT ATRIUM Size: Normal LA masses: No masses OCCLUDER DEVICE  MAIN PA Size: Normal  PULMONIC VALVE Morphology: Normal Mobility: Fully Mobile  RIGHT VENTRICLE Size: MILDLY ENLARGED Free wall: Normal Contraction: Normal RV masses: No Masses TAPSE: 2.5 cm, Normal Range [>= 1.6 cm]  TRICUSPID VALVE Leaflets: Normal Mobility: Fully mobile Morphology: Normal  RIGHT ATRIUM Size: MILDLY ENLARGED RA Other: None RA  masses: No masses  PERICARDIUM Fluid: No effusion  INFERIOR VENACAVA Size: Normal ABNORMAL RESPIRATORY COLLAPSE  DOPPLER ECHO and OTHER SPECIAL PROCEDURES ------------------------------------ Aortic: No AR No AS  Mitral: No MR No MS MV Inflow E Vel.= 75.0 cm/s MV Annulus E'Vel.= 9.3 cm/s E/E'Ratio= 8  Tricuspid: TRIVIAL TR No TS  Pulmonary: TRIVIAL PR No PS  Other:  INTERPRETATION --------------------------------------------------------------- NORMAL LEFT VENTRICULAR SYSTOLIC FUNCTION NORMAL RIGHT VENTRICULAR SYSTOLIC FUNCTION VALVULAR REGURGITATION: TRIVIAL PR, TRIVIAL TR NO VALVULAR STENOSIS Compared with prior Echo study on 08/20/2004: No significant change   (Report  version 3.0) Interpreted and Electronically signed Perform. by: Jacqualine Mau, RCS by: Elfredia Nevins, MD Resp.Person: Jacqualine Mau, RCS On: 04/24/2013 17:27:21  Assessment / Plan: 1. Prior ASD repair - last echo a year ago - EF normal. Structurally ok.  2. Volume overload - most likely from obesity/lifestyle choices - discussed at length - needs to really make her health a priority - not sure if she has the motivation to follow thru. Lasix refilled today.   3. Morbid obesity - this is the crux of her issues - discussed again at length. See back in 3 months to reassess progress.  Check labs today. See in 3 months. Lasix refilled today.  Patient is agreeable to this plan and will call if any problems develop in the interim.   Burtis Junes, RN, [facility] [address] [place], [place]  [id number] [phone]

## 2014-05-01 NOTE — Patient Instructions (Signed)
We will be checking the following labs today CBC, BMET, TSH, BNP, HPF, and Lipids  See me in 3 months   I have refilled your lasix today  Elevate your legs - try to use support stockings as well for your swelling  Here are my tips to lose weight:  1. Drink only water. You do not need milk, juice, tea, soda or diet soda.  2. Do not eat anything "white". This includes white bread, potatoes, rice or mayo  3. Stay away from fried foods and sweets  4. Your portion should be the size of the palm of your hand.  5. Know what your weaknesses are and avoid.  6. Find an exercise you like and do it with a goal of every day for 45 to 60 minutes.       Call the [place] office at [phone] if you have any questions, problems or concerns.

## 2014-05-02 ENCOUNTER — Telehealth: Payer: Self-pay | Admitting: Nurse Practitioner

## 2014-05-02 NOTE — Telephone Encounter (Signed)
The pt has been given her recent lab results, she verbalized understanding.

## 2014-05-02 NOTE — Telephone Encounter (Signed)
New message    Calling from test results

## 2014-05-03 ENCOUNTER — Other Ambulatory Visit: Payer: Self-pay

## 2014-06-07 ENCOUNTER — Other Ambulatory Visit: Payer: Self-pay

## 2014-06-07 DIAGNOSIS — Z1231 Encounter for screening mammogram for malignant neoplasm of breast: Secondary | ICD-10-CM

## 2014-06-18 ENCOUNTER — Encounter: Payer: Self-pay | Admitting: Interventional Cardiology

## 2014-06-18 DIAGNOSIS — R600 Localized edema: Secondary | ICD-10-CM

## 2014-06-20 ENCOUNTER — Ambulatory Visit: Payer: Self-pay | Admitting: Nurse Practitioner

## 2014-06-20 ENCOUNTER — Telehealth: Payer: Self-pay | Admitting: Internal Medicine

## 2014-06-20 NOTE — Telephone Encounter (Signed)
Patient Information:  Caller Name: Teryl  Phone: [phone]  Patient: Jessica Davenport, Jessica Davenport  Gender: Female  DOB: 1966/07/12  Age: 48 Years  PCP: [name] (Adults only)  Pregnant: No  Office Follow Up:  Does the office need to follow up with this patient?: No  Instructions For The Office: N/A  RN Note:  Noted bilateral edema 06/20/14. Called from work. Advised to see provider within 4 hours for swelling of hands with bilateral lower leg edema. No appointments remain at [place], [facility] or [place].  Scheduled for 1300 06/20/14 with Hinda Glatter, NP at [facility].   Symptoms  Reason For Call & Symptoms: Bilateral lower leg edema below knees for past two months.  Also reports ongoing eczema rash on bilateal lower legs with redness, itching, and aching at night.  Reviewed Health History In EMR: Yes  Reviewed Medications In EMR: Yes  Reviewed Allergies In EMR: Yes  Reviewed Surgeries / Procedures: Yes  Date of Onset of Symptoms: 04/18/2014  Treatments Tried: elevates legs when home, takes break to walk around, > water intake, watching sodium intake, rinses canned foods,, taking fluid pills  Treatments Tried Worked: No OB / GYN:  LMP: Unknown  Guideline(s) Used:  Leg Swelling and Edema  Disposition Per Guideline:   Go to Office Now  Reason For Disposition Reached:   Swelling of face, arm or hands (Exception: slight puffiness of fingers during hot weather)  Advice Given:  Varicose Veins - Treatment:  Try to rest and elevate your legs above your heart a couple times each day for 15 minutes.  Walking is good for your blood flow (Reason: helps pump the blood out of the veins).  Avoid prolonged standing in one place.  Support hose may be helpful. Put them on first thing in the morning when the swelling is least.  Call Back If:  Swelling becomes worse  Swelling becomes red or painful to the touch  Calf pain occurs and becomes constant  You become worse.  Patient Will Follow  Care Advice:  YES  Appointment Scheduled:  06/20/2014 13:00:00 Appointment Scheduled Provider:  Other

## 2014-07-02 ENCOUNTER — Telehealth: Payer: Self-pay | Admitting: *Deleted

## 2014-07-02 NOTE — Telephone Encounter (Signed)
[facility] - Client [place] Call Center Patient Name: [facility] Gender: Female DOB: Nov 21, 1965 Age: 48 Y 55 M Return Phone Number: [phone] (Primary) Address: [address] City/State/Zip: [place] [address] Client [facility] - Client Client Site [place] - Day Physician Jones, Mary Esther Type Call Call Type Triage / Clinical Relationship To Patient Self Return Phone Number [phone] (Primary) Chief Complaint Leg Swelling And Edema Initial Comment caller states her right hand is swollen and her legs are swollen PreDisposition Call Doctor Nurse Assessment Nurse: Donalynn Furlong, RN, Myna Hidalgo Date/Time Eilene Ghazi Time): 07/02/2014 9:39:45 AM Confirm and document reason for call. If symptomatic, describe symptoms. ---caller states her right hand is swollen and her legs are swollen, no other S/S Has the patient traveled out of the country within the last 30 days? ---No Does the patient require triage? ---Yes Related visit to physician within the last 2 weeks? ---Yes Does the PT have any chronic conditions? (i.e. diabetes, asthma, etc.) ---Yes List chronic conditions. ---"heart problems", takes lasix for swelling. Did the patient indicate they were pregnant? ---No Guidelines Guideline Title Affirmed Question Affirmed Notes Nurse Date/Time Eilene Ghazi Time) Leg Swelling and Edema [1] MILD swelling of both ankles (i.e., pedal edema) AND [2] is a chronic symptom (recurrent or ongoing AND present > 4 weeks) Donalynn Furlong, RN, Myna Hidalgo 07/02/2014 9:43:53 AM Disp. Time Eilene Ghazi Time) Disposition Final User 07/02/2014 9:27:46 AM Send To Clinical Follow Up Jeral Fruit 07/02/2014 9:53:23 AM See PCP within 2 Weeks Yes Donalynn Furlong, RN, Myna Hidalgo Caller Understands: Yes PLEASE NOTE: All timestamps contained within this report are represented as [place] Standard Time. CONFIDENTIALTY NOTICE:  This fax transmission is intended only for the addressee. It contains information that is legally privileged, confidential or otherwise protected from use or disclosure. If you are not the intended recipient, you are strictly prohibited from reviewing, disclosing, copying using or disseminating any of this information or taking any action in reliance on or regarding this information. If you have received this fax in error, please notify us immediately by telephone so that we can arrange for its return to [place]. Phone: [phone], Toll-Free: [phone], Fax: [phone] Page: 2 of 2 Call Id: [id number] Disagree/Comply: Comply Care Advice Given Per Guideline SEE PCP WITHIN 2 WEEKS: You need an evaluation for this ongoing problem within the next 2 weeks. Call your doctor during regular office hours and make an appointment. LEG SWELLING AND/OR EDEMA: * Elevate your legs or try to lay down once or twice daily for 20 minutes. * Avoid socks with an elastic band at the top. Wear comfortable shoes. CALL BACK IF: * Swelling becomes worse * Swelling becomes red or painful to the touch * Calf pain occurs and becomes constant * You become worse. CARE ADVICE given per Leg Swelling and Edema (Adult) guideline. After Care Instructions Given Call Event Type User D

## 2014-07-04 ENCOUNTER — Ambulatory Visit: Payer: Self-pay | Admitting: Internal Medicine

## 2014-07-05 ENCOUNTER — Telehealth: Payer: Self-pay | Admitting: Family

## 2014-07-05 DIAGNOSIS — R05 Cough: Secondary | ICD-10-CM

## 2014-07-05 DIAGNOSIS — J069 Acute upper respiratory infection, unspecified: Secondary | ICD-10-CM

## 2014-07-05 DIAGNOSIS — R059 Cough, unspecified: Secondary | ICD-10-CM

## 2014-07-05 MED ORDER — AZITHROMYCIN 250 MG PO TABS: ORAL_TABLET | ORAL | Status: DC

## 2014-07-05 MED ORDER — BENZONATATE 100 MG PO CAPS: 100.0000 mg | ORAL_CAPSULE | Freq: Three times a day (TID) | ORAL | Status: DC | PRN

## 2014-07-05 NOTE — Progress Notes (Signed)
We are sorry that you are not feeling well.  Here is how we plan to help!  Based on what you have shared with me it looks like you have upper respiratory tract inflammation that has resulted in a signification cough.  Inflammation and infection in the upper respiratory tract is commonly called bronchitis and has four common causes:  Allergies, Viral Infections, Acid Reflux and Bacterial Infections.  Allergies, viruses and acid reflux are treated by controlling symptoms or eliminating the cause. An example might be a cough caused by taking certain blood pressure medications. You stop the cough by changing the medication. Another example might be a cough caused by acid reflux. Controlling the reflux helps control the cough.  Based on your presentation I believe you most likely have A cough due to bacteria.  When patients have a fever and a productive cough with a change in color or increased sputum production, we are concerned about bacterial bronchitis.  If left untreated it can progress to pneumonia.  If your symptoms do not improve with your treatment plan it is important that you contact your provider.   I have prescribed Azithromyin 250 mg: two tables now and then one tablet daily for 4 additonal days   In addition you may use A prescription cough medication called Tessalon Perles 100mg . You may take 1-2 capsules every 8 hours as needed for your cough.    HOME CARE . Only take medications as instructed by your medical team. . Complete the entire course of an antibiotic. . Drink plenty of fluids and get plenty of rest. . Avoid close contacts especially the very young and the elderly . Cover your mouth if you cough or cough into your sleeve. . Always remember to wash your hands . A steam or ultrasonic humidifier can help congestion.    GET HELP RIGHT AWAY IF: . You develop worsening fever. . You become short of breath . You cough up blood. . Your symptoms persist after you have completed your  treatment plan MAKE SURE YOU   Understand these instructions.  Will watch your condition.  Will get help right away if you are not doing well or get worse.  Your e-visit answers were reviewed by a board certified advanced clinical practitioner to complete your personal care plan.  Depending on the condition, your plan could have included both over the counter or prescription medications.  Please review your pharmacy choice.  If there is a problem, you may call our nursing hot line at [phone] and have the prescription routed to another pharmacy.  Your safety is important to [place].  If you have drug allergies check your prescription carefully.    You can use MyChart to ask questions about today's visit, request a non-urgent call back, or ask for a work or school excuse.  You will get an e-mail in the next two days asking about your experience.  I hope that your e-visit has been valuable and will speed your recovery. Thank you for using e-visits.

## 2014-07-05 NOTE — Telephone Encounter (Signed)
Spoke w/pt.  Advised will schedule LE venous doppler to evaluate LE swelling.  She states she watches her salt intake and is very conscious of her restrictions.

## 2014-07-08 ENCOUNTER — Ambulatory Visit (HOSPITAL_COMMUNITY): Payer: 59 | Attending: Cardiovascular Disease | Admitting: Cardiology

## 2014-07-08 DIAGNOSIS — E669 Obesity, unspecified: Secondary | ICD-10-CM | POA: Diagnosis not present

## 2014-07-08 DIAGNOSIS — M79605 Pain in left leg: Secondary | ICD-10-CM | POA: Insufficient documentation

## 2014-07-08 DIAGNOSIS — M79604 Pain in right leg: Secondary | ICD-10-CM | POA: Insufficient documentation

## 2014-07-08 DIAGNOSIS — Z8249 Family history of ischemic heart disease and other diseases of the circulatory system: Secondary | ICD-10-CM | POA: Diagnosis not present

## 2014-07-08 DIAGNOSIS — R600 Localized edema: Secondary | ICD-10-CM

## 2014-07-08 DIAGNOSIS — R0602 Shortness of breath: Secondary | ICD-10-CM | POA: Diagnosis not present

## 2014-07-08 DIAGNOSIS — R609 Edema, unspecified: Secondary | ICD-10-CM | POA: Diagnosis not present

## 2014-07-08 DIAGNOSIS — M79606 Pain in leg, unspecified: Secondary | ICD-10-CM

## 2014-07-08 NOTE — Progress Notes (Signed)
Bilateral lower venous duplex performed  

## 2014-07-10 ENCOUNTER — Ambulatory Visit
Admission: RE | Admit: 2014-07-10 | Discharge: 2014-07-10 | Disposition: A | Discharge status: Home / Self Care | Payer: 59 | Source: Ambulatory Visit

## 2014-07-10 DIAGNOSIS — Z1231 Encounter for screening mammogram for malignant neoplasm of breast: Secondary | ICD-10-CM

## 2014-07-18 ENCOUNTER — Telehealth: Payer: Self-pay | Admitting: Interventional Cardiology

## 2014-07-18 NOTE — Telephone Encounter (Signed)
New Msg         Pt calling to inform weight has increased by 6lbs in a week. Pt was informed to call and report this.  Please return pt call.

## 2014-07-18 NOTE — Telephone Encounter (Signed)
Calling stating she spoke to someone in our office last week and was told to monitor her weight due to swelling in legs.  She states on 12/23 her wt was 297 lbs and today is 304 lbs. She monitors her salt intake.  States she is SOB but " not any worse than usual". States swelling in legs about the same.  Is scheduled to see Dr. Irish Lack on [place] 1/5.  Is taking Lasix 40 mg daily.  Spoke w/Scott Weaver,PA/flex  who suggests that she take an extra dose of Lasix today and see [place] on Tues. She verbalizes understanding and will take the extra Lasix today.

## 2014-07-23 ENCOUNTER — Other Ambulatory Visit: Payer: Self-pay

## 2014-07-23 ENCOUNTER — Ambulatory Visit (INDEPENDENT_AMBULATORY_CARE_PROVIDER_SITE_OTHER): Payer: 59 | Admitting: Interventional Cardiology

## 2014-07-23 ENCOUNTER — Encounter: Payer: Self-pay | Admitting: Interventional Cardiology

## 2014-07-23 ENCOUNTER — Ambulatory Visit (HOSPITAL_COMMUNITY): Payer: 59 | Attending: Interventional Cardiology | Admitting: Cardiology

## 2014-07-23 VITALS — BP 136/88 | HR 71 | Ht 67.0 in | Wt 302.0 lb

## 2014-07-23 DIAGNOSIS — I270 Primary pulmonary hypertension: Secondary | ICD-10-CM

## 2014-07-23 DIAGNOSIS — R600 Localized edema: Secondary | ICD-10-CM

## 2014-07-23 DIAGNOSIS — I272 Pulmonary hypertension, unspecified: Secondary | ICD-10-CM

## 2014-07-23 DIAGNOSIS — R0602 Shortness of breath: Secondary | ICD-10-CM

## 2014-07-23 DIAGNOSIS — R0600 Dyspnea, unspecified: Secondary | ICD-10-CM | POA: Insufficient documentation

## 2014-07-23 DIAGNOSIS — Q2111 Secundum atrial septal defect: Secondary | ICD-10-CM

## 2014-07-23 DIAGNOSIS — Q211 Atrial septal defect: Secondary | ICD-10-CM

## 2014-07-23 DIAGNOSIS — E66813 Obesity, class 3: Secondary | ICD-10-CM

## 2014-07-23 LAB — BASIC METABOLIC PANEL
BUN: 10 mg/dL (ref 6–23)
CHLORIDE: 101 meq/L (ref 96–112)
CO2: 30 meq/L (ref 19–32)
CREATININE: 0.9 mg/dL (ref 0.4–1.2)
Calcium: 9.7 mg/dL (ref 8.4–10.5)
GFR: 91.24 mL/min (ref >60.00)
Glucose, Bld: 93 mg/dL (ref 70–99)
Potassium: 3.6 mEq/L (ref 3.5–5.1)
Sodium: 139 mEq/L (ref 135–145)

## 2014-07-23 LAB — BRAIN NATRIURETIC PEPTIDE: PRO B NATRI PEPTIDE: 11 pg/mL (ref 0.0–100.0)

## 2014-07-23 NOTE — Patient Instructions (Addendum)
Your physician recommends that you schedule a follow-up appointment in:  --Follow up will be determined based on results of echocardiogram  Your physician has requested that you have an echocardiogram. Echocardiography is a painless test that uses sound waves to create images of your heart. It provides your doctor with information about the size and shape of your heart and how well your heart's chambers and valves are working. This procedure takes approximately one hour. There are no restrictions for this procedure.   Lab work to be done today--BMP, BNP

## 2014-07-23 NOTE — Progress Notes (Signed)
Patient ID: Jessica Davenport, female   DOB: 09/13/1965, 49 y.o.   MRN: [id number]    [place], [place], [place]  [address] Phone: [phone] Fax:  [phone]  Date:  07/23/2014   ID:  Jessica Davenport, DOB 06-01-1966, MRN [id number]  PCP:  [name], MD      History of Present Illness: Jessica Davenport is a 49 y.o. female who has had longstanding issues with LE edema.  SHe has had some DOE.  She benefits from diuretics, leg elevation and then compression stockings.  H/o ASD repair at age 31.   00  She continues to have DOE.  She walks twice a day at least 15 minutes each time- at work.  She walks on the weekends as well- continuously for an hour.    Wt Readings from Last 3 Encounters:  07/23/14 302 lb (136.986 kg)  05/01/14 306 lb 12.8 oz (139.164 kg)  08/31/13 302 lb (136.986 kg)     Past Medical History  Diagnosis Date  . Allergy   . Anemia   . Lumbar disc disease   . Vitamin D deficiency   . 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 Pulmonary hypertension   . Morbid obesity   . Fibroid, uterine   . Abscess   . Infected sebaceous cyst     mons pubis  . Small bowel obstruction 2013    Current Outpatient Prescriptions  Medication Sig Dispense Refill  . aspirin 81 MG tablet Take 81 mg by mouth daily.    . cetirizine (ZYRTEC) 10 MG tablet Take 1 tablet (10 mg total) by mouth 2 (two) times daily. 180 tablet 3  . clobetasol ointment (TEMOVATE) 7.89 % Apply 1 application topically 2 (two) times daily. 45 g 1  . furosemide (LASIX) 40 MG tablet TAKE ONE TABLET BY MOUTH ONCE DAILY 90 tablet 3  . Glucosamine HCl 1000 MG TABS Take by mouth as needed.     Marland Kitchen omeprazole (PRILOSEC OTC) 20 MG tablet Take 80 mg by mouth daily.      No current facility-administered medications for this visit.    Allergies:    Allergies  Allergen Reactions  . Other     Celery and green peas    Social History:  The patient  reports that she has never  smoked. She has never used smokeless tobacco. She reports that she does not drink alcohol or use illicit drugs.   Family History:  The patient's family history includes Anxiety disorder in her sister; Breast cancer in her maternal aunt and mother; Cervical cancer in her maternal aunt; Colon cancer in her maternal uncle; Coronary artery disease in her father and mother; Diabetes in her father and mother; Hypertension in her father and mother; Ovarian cancer in her maternal aunt.   ROS:  Please see the history of present illness.  No nausea, vomiting.  No fevers, chills.  No focal weakness.  No dysuria.    All other systems reviewed and negative.   PHYSICAL EXAM: VS:  BP 136/88 mmHg  Pulse 71  Ht 5\' 7"  (1.702 m)  Wt 302 lb (136.986 kg)  BMI 47.29 kg/m2  LMP 11/10/2011 General: Well developed, well nourished, in no acute distress HEENT: normal Neck: no JVD, no carotid bruits Cardiac:  normal S1, S2; RRR;  Lungs:  clear to auscultation bilaterally, no wheezing, rhonchi or rales Abd: soft, nontender, no hepatomegaly Ext: Trace pretibial pitting bilateral lower  extremity edema Skin: warm and dry Neuro:   no focal abnormalities noted Psych: normal affect      ASSESSMENT AND PLAN:  1. DOE: Check echo.  Look for fluid overload as well with BNP.  COntinue Lasix at the current dose. Of note, she has had lightheadedness in the past when she has been over diuresed. 2. Obesity: we spoke at length about weight loss.  She has lost some weight since her last visit.  Consider referral to a nutritionist.  Could discuss weight loss surgery evaluation at the next visit.   3. Edema: Improved with leg elevation and compression stockings.  Contine Lasix for now   4. Pulmonary HTN: Likely related to Prior ASD and obesity.  Reassess with echo.  Signed, Mina Marble, MD, [facility] 07/23/2014 11:27 AM

## 2014-07-24 NOTE — Progress Notes (Signed)
Echo performed. 

## 2014-07-29 ENCOUNTER — Ambulatory Visit: Payer: 59 | Admitting: Nurse Practitioner

## 2014-08-19 ENCOUNTER — Ambulatory Visit (INDEPENDENT_AMBULATORY_CARE_PROVIDER_SITE_OTHER): Payer: 59 | Admitting: Family Medicine

## 2014-08-19 VITALS — BP 120/78 | HR 82 | Temp 98.7°F | Resp 16 | Ht 65.5 in | Wt 300.4 lb

## 2014-08-19 DIAGNOSIS — M7521 Bicipital tendinitis, right shoulder: Secondary | ICD-10-CM

## 2014-08-19 DIAGNOSIS — R600 Localized edema: Secondary | ICD-10-CM

## 2014-08-19 DIAGNOSIS — R21 Rash and other nonspecific skin eruption: Secondary | ICD-10-CM

## 2014-08-19 MED ORDER — FLUOCINONIDE-E 0.05 % EX CREA: 1.0000 "application " | TOPICAL_CREAM | Freq: Two times a day (BID) | CUTANEOUS | Status: DC

## 2014-08-19 MED ORDER — TORSEMIDE 20 MG PO TABS: 20.0000 mg | ORAL_TABLET | Freq: Every day | ORAL | Status: DC

## 2014-08-19 MED ORDER — PREDNISONE 20 MG PO TABS: 40.0000 mg | ORAL_TABLET | Freq: Every day | ORAL | Status: DC

## 2014-08-19 NOTE — Patient Instructions (Signed)
Call elastics therapy at [phone] Measurements are 9" ankles, 18" mid thigh, 28" thigh Pressure 20-30 thigh length Color/open or closed toe optional    Biceps Tendon Tendinitis (Proximal) and Tenosynovitis with Rehab Tendonitis and tenosynovitis involve inflammation of the tendon and the tendon lining (sheath). The proximal biceps tendon is vulnerable to tendonitis and tenosynovitis, which causes pain and discomfort in the front of the shoulder and upper arm. The tendon lining secretes a fluid that helps lubricate the tendon, allowing for proper function without pain. When the tendon and its lining become inflamed, the tendon can no longer glide smoothly, causing pain. The proximal biceps tendon connects the biceps muscle to two bones of the shoulder. It is important for proper function of the elbow and turning the palm upward (supination) using the wrist. Proximal biceps tendon tendinitis may include a grade 1 or 2 strain of the tendon. Grade 1 strains involve a slight pull of the tendon without signs of tearing and no observed tendon lengthening. There is also no loss of strength. Grade 2 strains involve small tears in the tendon fibers. The tendon or muscle is stretched and strength is usually decreased.  SYMPTOMS   Pain, tenderness, swelling, warmth, or redness over the front of the shoulder.  Pain that gets worse with shoulder and elbow use, especially against resistance.  Limited motion of the shoulder or elbow.  Crackling sound (crepitation) when the tendon or shoulder is moved or touched. CAUSES  The symptoms of biceps tendonitis are due to inflammation of the tendon. Inflammation may be caused by:  Strain from sudden increase in amount or intensity of activity.  Direct blow or injury to the elbow (uncommon).  Overuse or repetitive elbow bending or wrist rotation, particularly when turning the palm up, or with elbow hyperextension. RISK INCREASES WITH:  Sports that involve  contact or overhead arm activity (throwing sports, gymnastics, weightlifting, bodybuilding, rock climbing).  Heavy labor.  Poor strength and flexibility.  Failure to warm up properly before activity. PREVENTION  Warm up and stretch properly before activity.  Allow time for recovery between activities.  Maintain physical fitness:  Strength, flexibility, and endurance.  Cardiovascular fitness.  Learn and use proper exercise technique. PROGNOSIS  With proper treatment, proximal biceps tendon tendonitis and tenosynovitis is usually curable within 6 weeks. Healing is usually quicker if the cause was a direct blow, not overuse.  RELATED COMPLICATIONS   Longer healing time if not properly treated or if not given enough time to heal.  Chronically inflamed tendon that causes persistent pain with activity, that may progress to constant pain and potentially rupture of the tendon.  Recurring symptoms, especially if activity is resumed too soon or with overuse, a direct blow, or use of poor exercise technique. TREATMENT Treatment first involves ice and medicine, to reduce pain and inflammation. It is helpful to modify activities that cause pain, to reduce the chances of causing the condition to get worse. Strengthening and stretching exercises should be performed to promote proper use of the muscles of the shoulder. These exercises may be performed at home or with a therapist. Other treatments may be given such as ultrasound or heat therapy. A corticosteroid injection may be recommended to help reduce inflammation of the tendon lining. Surgery is usually not necessary. Sometimes, if symptoms last for greater than 6 months, surgery will be advised to detach the tendon and re-insert it into the arm bone. Surgery to correct other shoulder problems that may be contributing to tendinitis may be  advised before surgery for the tendinitis itself.  MEDICATION  If pain medicine is needed, nonsteroidal  anti-inflammatory medicines (aspirin and ibuprofen), or other minor pain relievers (acetaminophen), are often advised.  Do not take pain medicine for 7 days before surgery.  Prescription pain relievers may be given if your caregiver thinks they are needed. Use only as directed and only as much as you need.  Corticosteroid injections may be given. These injections should only be used on the most severe cases, as one can only receive a limited number of them. HEAT AND COLD   Cold treatment (icing) should be applied for 10 to 15 minutes every 2 to 3 hours for inflammation and pain, and immediately after activity that aggravates your symptoms. Use ice packs or an ice massage.  Heat treatment may be used before performing stretching and strengthening activities prescribed by your caregiver, physical therapist, or athletic trainer. Use a heat pack or a warm water soak. SEEK MEDICAL CARE IF:   Symptoms get worse or do not improve in 2 weeks, despite treatment.  New, unexplained symptoms develop. (Drugs used in treatment may produce side effects.) EXERCISES RANGE OF MOTION (ROM) AND EXERCISES - Biceps Tendon (Proximal) and Tenosynovitis These exercises may help you when beginning to rehabilitate your injury. Your symptoms may go away with or without further involvement from your physician, physical therapist, or athletic trainer. While completing these exercises, remember:   Restoring tissue flexibility helps normal motion to return to the joints. This allows healthier, less painful movement and activity.  An effective stretch should be held for at least 30 seconds.  A stretch should never be painful. You should only feel a gentle lengthening or release in the stretched tissue. STRETCH - Flexion, Standing  Stand with good posture. With an underhand grip on your right / left hand and an overhand grip on the opposite hand, grasp a broomstick or cane so that your hands are a little more than  shoulder width apart.  Keeping your right / left elbow straight and shoulder muscles relaxed, push the stick with your opposite hand to raise your right / left arm in front of your body and then overhead. Raise your arm until you feel a stretch in your right / left shoulder, but before you have increased shoulder pain.  Try to avoid shrugging your right / left shoulder as your arm rises, by keeping your shoulder blade tucked down and toward your mid-back spine. Hold for __________ seconds.  Slowly return to the starting position. Repeat __________ times. Complete this exercise __________ times per day. STRETCH - Abduction, Supine  Lie on your back. With an underhand grip on your right / left hand and an overhand grip on the opposite hand, grasp a broomstick or cane so that your hands are a little more than shoulder width apart.  Keeping your right / left elbow straight and shoulder muscles relaxed, push the stick with your opposite hand to raise your right / left arm out to the side of your body and then overhead. Raise your arm until you feel a stretch in your right / left shoulder, but before you have increased shoulder pain.  Try to avoid shrugging your right / left shoulder as your arm rises, by keeping your shoulder blade tucked down and toward your mid-back spine. Hold for __________ seconds.  Slowly return to the starting position. Repeat __________ times. Complete this exercise __________ times per day. ROM - Flexion, Active-Assisted  Lie on your back. You  may bend your knees for comfort.  Grasp a broomstick or cane so your hands are about shoulder width apart. Your right / left hand should grip the end of the stick so that your hand is positioned "thumbs-up," as if you were about to shake hands.  Using your healthy arm to lead, raise your right / left arm overhead until you feel a gentle stretch in your shoulder. Hold for __________ seconds.  Use the stick to assist in returning  your right / left arm to its starting position. Repeat __________ times. Complete this exercise __________ times per day.  STRETCH - Flexion, Standing   Stand facing a wall. Walk your right / left fingers up the wall until you feel a moderate stretch in your shoulder. As your hand gets higher, you may need to step closer to the wall or use a door frame to walk through.  Try to avoid shrugging your right / left shoulder as your arm rises, by keeping your shoulder blade tucked down and toward your mid-back spine.  Hold for __________ seconds. Use your other hand, if needed, to ease out of the stretch and return to the starting position. Repeat __________ times. Complete this exercise __________ times per day.  ROM - Internal Rotation   Using underhand grips, grasp a stick behind your back with both hands.  While standing upright with good posture, slide the stick up your back until you feel a mild stretch in the front of your shoulder.  Hold for __________ seconds. Slowly return to your starting position. Repeat __________ times. Complete this exercise __________ times per day.  STRETCH - Internal Rotation  Place your right / left hand behind your back, palm-up.  Throw a towel or belt over your opposite shoulder. Grasp the towel with your right / left hand.  While keeping an upright posture, gently pull up on the towel until you feel a stretch in the front of your right / left shoulder.  Avoid shrugging your right / left shoulder as your arm rises, by keeping your shoulder blade tucked down and toward your mid-back spine.  Hold for __________ seconds. Release the stretch by lowering your opposite hand. Repeat __________ times. Complete this exercise __________ times per day. STRENGTHENING EXERCISES - Biceps Tendon Tendinitis (Proximal) and Tenosynovitis These exercises may help you regain your strength after your physician has discontinued your restraint in a cast or brace. They may  resolve your symptoms with or without further involvement from your physician, physical therapist or athletic trainer. While completing these exercises, remember:   Muscles can gain both the endurance and the strength needed for everyday activities through controlled exercises.  Complete these exercises as instructed by your physician, physical therapist or athletic trainer. Increase the resistance and repetitions only as guided.  You may experience muscle soreness or fatigue, but the pain or discomfort you are trying to eliminate should never worsen during these exercises. If this pain does get worse, stop and make sure you are following the directions exactly. If the pain is still present after adjustments, discontinue the exercise until you can discuss the trouble with your caregiver. STRENGTH - Elbow Flexors, Isometric  Stand or sit upright on a firm surface. Place your right / left arm so that your hand is palm-up and at the height of your waist.  Place your opposite hand on top of your forearm. Gently push down as your right / left arm resists. Push as hard as you can with both arms,  without causing any pain or movement at your right / left elbow. Hold this stationary position for __________ seconds.  Gradually release the tension in both arms. Allow your muscles to relax completely before repeating. Repeat __________ times. Complete this exercise __________ times per day. STRENGTH - Shoulder Flexion, Isometric  With good posture and facing a wall, stand or sit about 4-6 inches away.  Keeping your right / left elbow straight, gently press the top of your fist into the wall. Increase the pressure gradually until you are pressing as hard as you can, without shrugging your shoulder or increasing any shoulder discomfort.  Hold for __________ seconds.  Release the tension slowly. Relax your shoulder muscles completely before you start the next repetition. Repeat __________ times. Complete  this exercise __________ times per day.  STRENGTH - Elbow Flexors, Supinated  With good posture, stand or sit on a firm chair without armrests. Allow your right / left arm to rest at your side with your palm facing forward.  Holding a __________ weight, or gripping a rubber exercise band or tubing,  bring your hand toward your shoulder.  Allow your muscles to control the resistance as your hand returns to your side. Repeat __________ times. Complete this exercise __________ times per day.  STRENGTH - Shoulder Flexion  Stand or sit with good posture. Grasp a __________ weight, or an exercise band or tubing, so that your hand is "thumbs-up," like when you shake hands.  Slowly lift your right / left arm as far as you can, without increasing any shoulder pain. At first, many people can only raise their hand to shoulder height.  Avoid shrugging your right / left shoulder as your arm rises, by keeping your shoulder blade tucked down and toward your mid-back spine.  Hold for __________ seconds. Control the descent of your hand as you slowly return to your starting position. Repeat __________ times. Complete this exercise __________ times per day. Document Released: 07/05/2005 Document Revised: 09/27/2011 Document Reviewed: 10/17/2008 [facility] Patient Information 2015 [place], [place]. This information is not intended to replace advice given to you by your health care provider. Make sure you discuss any questions you have with your health care provider.

## 2014-08-19 NOTE — Progress Notes (Signed)
This chart was scribed for Robyn Haber, MD by Einar Pheasant, ED Scribe. This patient was seen in room 14 and the patient's care was started at 8:18 PM.  Patient ID: Jessica Davenport MRN: [id number], DOB: 1965/12/06, 49 y.o. Date of Encounter: 08/19/2014, 8:17 PM  Primary Physician: [name], MD  Chief Complaint:  Chief Complaint  Patient presents with  . 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 She states that she does not recall falling on hitting it on anything. Pain is localized to the anterior side of her shoulder. Pt denies fever, neck pain, sore throat, visual disturbance, CP, cough, SOB, abdominal pain, nausea, emesis, diarrhea, urinary symptoms, back pain, HA, weakness, numbness and rash as associated symptoms.    Pt is also complaining of bilateral leg edema and cellulitis. She was told that it was eczema but she does not think it is. Pt was prescribed a topical cream but it has not been working.   Past Medical History  Diagnosis Date  . Allergy   . Anemia   . Lumbar disc disease   . Vitamin D deficiency   . 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 Pulmonary hypertension   . Morbid obesity   . Fibroid, uterine   . Abscess   . Infected sebaceous cyst     mons pubis  . Small bowel obstruction 2013     Home Meds: Prior to Admission medications   Medication Sig Start Date End Date Taking? Authorizing Provider  aspirin 81 MG tablet Take 81 mg by mouth daily.   Yes Historical Provider, MD  cetirizine (ZYRTEC) 10 MG tablet Take 1 tablet (10 mg total) by mouth 2 (two) times daily. 08/11/13  Yes Janith Lima, MD  clobetasol ointment (TEMOVATE) 9.73 % Apply 1 application topically 2 (two) times daily. 08/30/13  Yes Janith Lima, MD  furosemide (LASIX) 40 MG tablet TAKE ONE TABLET BY  MOUTH ONCE DAILY 05/01/14  Yes Burtis Junes, NP  Glucosamine HCl 1000 MG TABS Take by mouth as needed.    Yes Historical Provider, MD  omeprazole (PRILOSEC OTC) 20 MG tablet Take 80 mg by mouth daily.  06/04/11  Yes Lafayette Dragon, MD    Allergies:  Allergies  Allergen Reactions  . Other     Celery and green peas    History   Social History  . Marital Status: Divorced    Spouse Name: N/A    Number of Children: 3  . Years of Education: N/A   Occupational History  . adult medicaid worker [employer] History Main Topics  . Smoking status: Never Smoker   . Smokeless tobacco: Never Used  . Alcohol Use: No  . Drug Use: No  . Sexual Activity: Yes    Birth Control/ Protection: Surgical   Other Topics Concern  . Not on file   Social History Narrative   No regular exercise     Review of Systems: positive shoulder pain and bilateral leg swelling Constitutional: negative for chills, fever, night sweats, weight changes, or fatigue  HEENT: negative for vision changes, hearing loss, congestion, rhinorrhea, ST, epistaxis, or sinus pressure Cardiovascular: negative for chest pain or palpitations Respiratory: negative for hemoptysis, wheezing, shortness of breath, or cough Abdominal:  negative for abdominal pain, nausea, vomiting, diarrhea, or constipation Dermatological: negative for rash Neurologic: negative for headache, dizziness, or syncope All other systems reviewed and are otherwise negative with the exception to those above and in the HPI.   Physical Exam: Blood pressure 120/78, pulse 82, temperature 98.7 F (37.1 C), temperature source Oral, resp. rate 16, height 5' 5.5" (1.664 m), weight 300 lb 6 oz (136.249 kg), last menstrual period 11/10/2011, SpO2 99 %., Body mass index is 49.21 kg/(m^2). General: Well developed, well nourished, in no acute distress. Head: Normocephalic, atraumatic, eyes without discharge, sclera non-icteric, nares are without discharge.  Bilateral auditory canals clear, TM's are without perforation, pearly grey and translucent with reflective cone of light bilaterally. Oral cavity moist, posterior pharynx without exudate, erythema, peritonsillar abscess, or post nasal drip.  Neck: Supple. No thyromegaly. Full ROM. No lymphadenopathy. Lungs: Clear bilaterally to auscultation without wheezes, rales, or rhonchi. Breathing is unlabored. Heart: RRR with S1 S2. No murmurs, rubs, or gallops appreciated. Abdomen: Soft, non-tender, non-distended with normoactive bowel sounds. No hepatomegaly. No rebound/guarding. No obvious abdominal masses. Msk:  Strength and tone normal for age. Very tender anterior right shoulder joint line but complete range of motion is present Extremities/Skin: Warm and dry. No clubbing or cyanosis. No edema. No rashes or suspicious lesions. Pain with rotation of the right shoulder.  Bilateral leg edema. Hyperpigmented rash with thickening to bilateral anterior shins just above the ankles.  Neuro: Alert and oriented X 3. Moves all extremities spontaneously. Gait is normal. CNII-XII grossly in tact. Psych:  Responds to questions appropriately with a normal affect.     ASSESSMENT AND PLAN:  49 y.o. year old female with  This chart was scribed in my presence and reviewed by me personally.    ICD-9-CM ICD-10-CM   1. Pedal edema 782.3 R60.0 torsemide (DEMADEX) 20 MG tablet  2. Rash and nonspecific skin eruption 782.1 R21 fluocinonide-emollient (LIDEX-E) 0.05 % cream  3. Biceps tendonitis on right 726.12 M75.21 predniSONE (DELTASONE) 20 MG tablet     Signed, Robyn Haber, MD   Signed, Robyn Haber, MD 08/19/2014 8:17 PM

## 2015-01-15 ENCOUNTER — Telehealth: Payer: Self-pay | Admitting: Internal Medicine

## 2015-01-15 NOTE — Telephone Encounter (Signed)
Patient is requesting a referral to [facility]- dr sanford. She refused a follow up visit.

## 2015-01-15 NOTE — Telephone Encounter (Signed)
Whati s the diagnosis for this?

## 2015-01-16 ENCOUNTER — Encounter: Payer: Self-pay | Admitting: Internal Medicine

## 2015-01-16 ENCOUNTER — Other Ambulatory Visit (INDEPENDENT_AMBULATORY_CARE_PROVIDER_SITE_OTHER): Payer: 59

## 2015-01-16 ENCOUNTER — Ambulatory Visit (INDEPENDENT_AMBULATORY_CARE_PROVIDER_SITE_OTHER): Payer: 59 | Admitting: Internal Medicine

## 2015-01-16 ENCOUNTER — Telehealth: Payer: Self-pay | Admitting: Interventional Cardiology

## 2015-01-16 VITALS — BP 124/78 | HR 76 | Temp 98.3°F | Resp 16 | Ht 65.5 in | Wt 303.0 lb

## 2015-01-16 DIAGNOSIS — E559 Vitamin D deficiency, unspecified: Secondary | ICD-10-CM

## 2015-01-16 DIAGNOSIS — I8311 Varicose veins of right lower extremity with inflammation: Secondary | ICD-10-CM | POA: Diagnosis not present

## 2015-01-16 DIAGNOSIS — D509 Iron deficiency anemia, unspecified: Secondary | ICD-10-CM | POA: Diagnosis not present

## 2015-01-16 DIAGNOSIS — I270 Primary pulmonary hypertension: Secondary | ICD-10-CM

## 2015-01-16 DIAGNOSIS — I8312 Varicose veins of left lower extremity with inflammation: Secondary | ICD-10-CM

## 2015-01-16 DIAGNOSIS — R609 Edema, unspecified: Secondary | ICD-10-CM

## 2015-01-16 DIAGNOSIS — I872 Venous insufficiency (chronic) (peripheral): Secondary | ICD-10-CM | POA: Insufficient documentation

## 2015-01-16 DIAGNOSIS — I272 Pulmonary hypertension, unspecified: Secondary | ICD-10-CM

## 2015-01-16 LAB — CBC WITH DIFFERENTIAL/PLATELET
BASOS PCT: 0.7 % (ref 0.0–3.0)
Basophils Absolute: 0.1 10*3/uL (ref 0.0–0.1)
EOS PCT: 0.9 % (ref 0.0–5.0)
Eosinophils Absolute: 0.1 10*3/uL (ref 0.0–0.7)
HEMATOCRIT: 40.4 % (ref 36.0–46.0)
Hemoglobin: 13.1 g/dL (ref 12.0–15.0)
LYMPHS ABS: 3.3 10*3/uL (ref 0.7–4.0)
LYMPHS PCT: 37.7 % (ref 12.0–46.0)
MCHC: 32.5 g/dL (ref 30.0–36.0)
MCV: 85.4 fl (ref 78.0–100.0)
Monocytes Absolute: 0.3 10*3/uL (ref 0.1–1.0)
Monocytes Relative: 3.9 % (ref 3.0–12.0)
NEUTROS ABS: 5 10*3/uL (ref 1.4–7.7)
NEUTROS PCT: 56.8 % (ref 43.0–77.0)
Platelets: 349 10*3/uL (ref 150.0–400.0)
RBC: 4.73 Mil/uL (ref 3.87–5.11)
RDW: 14.3 % (ref 11.5–15.5)
WBC: 8.9 10*3/uL (ref 4.0–10.5)

## 2015-01-16 LAB — URINALYSIS, ROUTINE W REFLEX MICROSCOPIC
BILIRUBIN URINE: NEGATIVE
HGB URINE DIPSTICK: NEGATIVE
Ketones, ur: NEGATIVE
LEUKOCYTES UA: NEGATIVE
Nitrite: NEGATIVE
Specific Gravity, Urine: 1.01 (ref 1.000–1.030)
Total Protein, Urine: NEGATIVE
Urine Glucose: NEGATIVE
Urobilinogen, UA: 0.2 (ref 0.0–1.0)
pH: 6 (ref 5.0–8.0)

## 2015-01-16 LAB — BASIC METABOLIC PANEL
BUN: 10 mg/dL (ref 6–23)
CO2: 35 mEq/L — ABNORMAL HIGH (ref 19–32)
CREATININE: 0.78 mg/dL (ref 0.40–1.20)
Calcium: 9.8 mg/dL (ref 8.4–10.5)
Chloride: 99 mEq/L (ref 96–112)
GFR: 101.9 mL/min (ref >60.00)
GLUCOSE: 95 mg/dL (ref 70–99)
Potassium: 4 mEq/L (ref 3.5–5.1)
Sodium: 141 mEq/L (ref 135–145)

## 2015-01-16 LAB — VITAMIN D 25 HYDROXY (VIT D DEFICIENCY, FRACTURES): VITD: 12.23 ng/mL — AB (ref 30.00–100.00)

## 2015-01-16 NOTE — Telephone Encounter (Signed)
Pt returned call.  States that she is still having trouble to LEE. Pt states that she has stopped taking Furosemide and was switched to Torsemide by Dr. Joseph Art in February. Pt states that she has SOB occasionally but rare. Pt states that occasionally on the weekends she forgets to take her Torsemide. Pt denies adding salt to foods and says propping her feet up doesn't help much. Pt states her BP and HR are always normal at the doctors but she doesn't check it at home. Pt would like blood work done if Dr. Irish Lack is agreeable. Informed pt that I would route this information to Dr. Irish Lack for review and advisement.

## 2015-01-16 NOTE — Telephone Encounter (Signed)
OK to check BNP, BMet.    If she feels furosemide worked better than torsemide, we can switch her back.

## 2015-01-16 NOTE — Telephone Encounter (Signed)
Pt c/o swelling: STAT is pt has developed SOB within 24 hours  1. How long have you been experiencing swelling? Almost 1 year   2. Where is the swelling located? Lower legs   3.  Are you currently taking a "fluid pill"? Yes, not taking lasix. Pt is taking torsemide  4.  Are you currently SOB? Yes.. At times.. Only when she has a lot of fluid   5.  Have you traveled recently? no   Comments: Need to update medications. Still has fluid and she believes that it is due to the medication.

## 2015-01-16 NOTE — Telephone Encounter (Signed)
Left message to call back  

## 2015-01-16 NOTE — Telephone Encounter (Signed)
Spoke with patient who states that she has a family history of renal failure. Due to that reason and continued back pain she would like a referral. Patient later decided to schedule appointment.

## 2015-01-16 NOTE — Progress Notes (Signed)
Pre visit review using our clinic review tool, if applicable. No additional management support is needed unless otherwise documented below in the visit note. 

## 2015-01-16 NOTE — Telephone Encounter (Signed)
Spoke with pt and informed her that Dr. Irish Lack said it was ok to check BNP and BMET. Pt states that she would like to stay on Torsemide because she feels that it works better than Furosemide. Scheduled lab appt for 01/17/15. Pt verbalized understanding and was in agreement with this plan.

## 2015-01-16 NOTE — Progress Notes (Signed)
Subjective:  Patient ID: Jessica Davenport, female    DOB: [birth year]  Age: 49 y.o. MRN: [id number]  CC: Hypertension   HPI Jessica Davenport presents for a BP check - she offers no new complaints today. She tells me that she saw a dermatologist about the rash on her legs and was told that it is stasis dermatitis, it has improved with topical steroids.  Outpatient Prescriptions Prior to Visit  Medication Sig Dispense Refill  . aspirin 81 MG tablet Take 81 mg by mouth daily.    . cetirizine (ZYRTEC) 10 MG tablet Take 1 tablet (10 mg total) by mouth 2 (two) times daily. 180 tablet 3  . fluocinonide-emollient (LIDEX-E) 0.05 % cream Apply 1 application topically 2 (two) times daily. 60 g 3  . Glucosamine HCl 1000 MG TABS Take by mouth as needed.     Marland Kitchen omeprazole (PRILOSEC OTC) 20 MG tablet Take 80 mg by mouth daily.     Marland Kitchen torsemide (DEMADEX) 20 MG tablet Take 1 tablet (20 mg total) by mouth daily. 90 tablet 3  . predniSONE (DELTASONE) 20 MG tablet Take 2 tablets (40 mg total) by mouth daily. 10 tablet 0   No facility-administered medications prior to visit.    ROS Review of Systems  Constitutional: Negative.  Negative for fever, diaphoresis, appetite change and fatigue.  HENT: Negative.   Eyes: Negative.   Respiratory: Negative.  Negative for cough, choking, chest tightness, shortness of breath and stridor.   Cardiovascular: Negative.  Negative for chest pain, palpitations and leg swelling.  Gastrointestinal: Negative.  Negative for nausea, vomiting, abdominal pain, diarrhea, constipation and blood in stool.  Endocrine: Negative.   Genitourinary: Negative.   Musculoskeletal: Negative.  Negative for myalgias, back pain, joint swelling and arthralgias.  Skin: Positive for rash.  Allergic/Immunologic: Negative.   Neurological: Negative.  Negative for dizziness, tremors, light-headedness and numbness.  Hematological: Negative.   Psychiatric/Behavioral: Negative.     Objective:  BP  124/78 mmHg  Pulse 76  Temp(Src) 98.3 F (36.8 C) (Oral)  Resp 16  Ht 5' 5.5" (1.664 m)  Wt 303 lb (137.44 kg)  BMI 49.64 kg/m2  SpO2 98%  LMP 11/10/2011  BP Readings from Last 3 Encounters:  01/16/15 124/78  08/19/14 120/78  07/23/14 136/88    Wt Readings from Last 3 Encounters:  01/16/15 303 lb (137.44 kg)  08/19/14 300 lb 6 oz (136.249 kg)  07/23/14 302 lb (136.986 kg)    Physical Exam  Constitutional: She is oriented to person, place, and time. She appears well-developed and well-nourished. No distress.  HENT:  Head: Normocephalic and atraumatic.  Mouth/Throat: Oropharynx is clear and moist. No oropharyngeal exudate.  Eyes: Conjunctivae are normal. Right eye exhibits no discharge. Left eye exhibits no discharge. No scleral icterus.  Neck: Normal range of motion. Neck supple. No JVD present. No tracheal deviation present. No thyromegaly present.  Cardiovascular: Normal rate, regular rhythm, normal heart sounds and intact distal pulses.  Exam reveals no gallop and no friction rub.   No murmur heard. Pulmonary/Chest: Effort normal and breath sounds normal. No stridor. No respiratory distress. She has no wheezes. She has no rales. She exhibits no tenderness.  Abdominal: Soft. Bowel sounds are normal. She exhibits no distension and no mass. There is no tenderness. There is no rebound and no guarding.  Musculoskeletal: Normal range of motion. She exhibits no edema or tenderness.  Lymphadenopathy:    She has no cervical adenopathy.  Neurological: She is oriented to  person, place, and time.  Skin: Skin is warm and dry. Rash noted. No purpura noted. Rash is papular. Rash is not macular, not maculopapular, not nodular, not pustular, not vesicular and not urticarial. She is not diaphoretic. No erythema. No pallor.     Psychiatric: She has a normal mood and affect. Her behavior is normal. Judgment and thought content normal.  Vitals reviewed.   Lab Results  Component Value Date     WBC 8.9 01/16/2015   HGB 13.1 01/16/2015   HCT 40.4 01/16/2015   PLT 349.0 01/16/2015   GLUCOSE 95 01/16/2015   CHOL 214* 05/01/2014   TRIG 139.0 05/01/2014   HDL 46.40 05/01/2014   LDLDIRECT 148.1 06/16/2012   LDLCALC 140* 05/01/2014   ALT 13 05/01/2014   AST 15 05/01/2014   NA 141 01/16/2015   K 4.0 01/16/2015   CL 99 01/16/2015   CREATININE 0.78 01/16/2015   BUN 10 01/16/2015   CO2 35* 01/16/2015   TSH 1.01 05/01/2014   HGBA1C 6.1 05/01/2014    Mm Digital Screening Bilateral  07/11/2014   CLINICAL DATA:  Screening.  EXAM: DIGITAL SCREENING BILATERAL MAMMOGRAM WITH CAD  COMPARISON:  Previous exam(s)  ACR Breast Density Category a: The breast tissue is almost entirely fatty.  FINDINGS: There are no findings suspicious for malignancy. Images were processed with CAD.  IMPRESSION: No mammographic evidence of malignancy. A result letter of this screening mammogram will be mailed directly to the patient.  RECOMMENDATION: Screening mammogram in one year. (Code:SM-B-01Y)  BI-RADS CATEGORY  1: Negative.   Electronically Signed   By: Hassan Rowan M.D.   On: 07/11/2014 15:07    Assessment & Plan:   Jessica Davenport was seen today for hypertension.  Diagnoses and all orders for this visit:  Pulmonary HTN- she has no SOB, DOE, or edema, her BP is well controlled, her lytes and renal function are stable Orders: -     Basic metabolic panel; Future -     Urinalysis, Routine w reflex microscopic (not at [facility]); Future  Iron deficiency anemia- this has resolved Orders: -     CBC with Differential/Platelet; Future  Vitamin D deficiency- this needs to be treated Orders: -     Vit D  25 hydroxy (rtn osteoporosis monitoring); Future -     Cholecalciferol 2000 UNITS TABS; Take 1 tablet (2,000 Units total) by mouth daily.  Stasis dermatitis of both legs- cont topical steroids   I have discontinued Jessica Davenport's predniSONE. I am also having her start on Cholecalciferol. Additionally, I am having her  maintain her Glucosamine HCl, aspirin, omeprazole, cetirizine, fluocinonide-emollient, and torsemide.  Meds ordered this encounter  Medications  . Cholecalciferol 2000 UNITS TABS    Sig: Take 1 tablet (2,000 Units total) by mouth daily.    Dispense:  90 tablet    Refill:  3     Follow-up: Return in about 4 months (around 05/18/2015).  [name], MD

## 2015-01-16 NOTE — Patient Instructions (Signed)

## 2015-01-17 ENCOUNTER — Other Ambulatory Visit: Payer: 59

## 2015-01-18 ENCOUNTER — Encounter: Payer: Self-pay | Admitting: Internal Medicine

## 2015-01-18 MED ORDER — CHOLECALCIFEROL 50 MCG (2000 UT) PO TABS: 1.0000 | ORAL_TABLET | Freq: Every day | ORAL | Status: DC

## 2015-05-19 ENCOUNTER — Other Ambulatory Visit (INDEPENDENT_AMBULATORY_CARE_PROVIDER_SITE_OTHER): Payer: 59

## 2015-05-19 ENCOUNTER — Ambulatory Visit (INDEPENDENT_AMBULATORY_CARE_PROVIDER_SITE_OTHER): Payer: 59 | Admitting: Internal Medicine

## 2015-05-19 VITALS — BP 113/80 | HR 90 | Temp 98.3°F | Resp 16 | Ht 65.5 in | Wt 290.0 lb

## 2015-05-19 DIAGNOSIS — I272 Other secondary pulmonary hypertension: Secondary | ICD-10-CM

## 2015-05-19 DIAGNOSIS — R7309 Other abnormal glucose: Secondary | ICD-10-CM

## 2015-05-19 DIAGNOSIS — D509 Iron deficiency anemia, unspecified: Secondary | ICD-10-CM

## 2015-05-19 DIAGNOSIS — E7800 Pure hypercholesterolemia, unspecified: Secondary | ICD-10-CM

## 2015-05-19 LAB — CBC WITH DIFFERENTIAL/PLATELET
BASOS ABS: 0 10*3/uL (ref 0.0–0.1)
Basophils Relative: 0.4 % (ref 0.0–3.0)
EOS ABS: 0.1 10*3/uL (ref 0.0–0.7)
Eosinophils Relative: 1.3 % (ref 0.0–5.0)
HEMATOCRIT: 41.3 % (ref 36.0–46.0)
Hemoglobin: 13.5 g/dL (ref 12.0–15.0)
LYMPHS ABS: 3.3 10*3/uL (ref 0.7–4.0)
Lymphocytes Relative: 41.6 % (ref 12.0–46.0)
MCHC: 32.6 g/dL (ref 30.0–36.0)
MCV: 85.6 fl (ref 78.0–100.0)
Monocytes Absolute: 0.3 10*3/uL (ref 0.1–1.0)
Monocytes Relative: 4.3 % (ref 3.0–12.0)
NEUTROS ABS: 4.1 10*3/uL (ref 1.4–7.7)
NEUTROS PCT: 52.4 % (ref 43.0–77.0)
PLATELETS: 380 10*3/uL (ref 150.0–400.0)
RBC: 4.83 Mil/uL (ref 3.87–5.11)
RDW: 14.7 % (ref 11.5–15.5)
WBC: 7.9 10*3/uL (ref 4.0–10.5)

## 2015-05-19 LAB — LIPID PANEL
CHOL/HDL RATIO: 4
Cholesterol: 227 mg/dL — ABNORMAL HIGH (ref 0–200)
HDL: 57.2 mg/dL (ref >39.00)
LDL CALC: 148 mg/dL — AB (ref 0–99)
NONHDL: 170.01
Triglycerides: 108 mg/dL (ref 0.0–149.0)
VLDL: 21.6 mg/dL (ref 0.0–40.0)

## 2015-05-19 LAB — BASIC METABOLIC PANEL
BUN: 8 mg/dL (ref 6–23)
CALCIUM: 9.7 mg/dL (ref 8.4–10.5)
CO2: 29 mEq/L (ref 19–32)
CREATININE: 0.73 mg/dL (ref 0.40–1.20)
Chloride: 100 mEq/L (ref 96–112)
GFR: 109.84 mL/min (ref >60.00)
Glucose, Bld: 109 mg/dL — ABNORMAL HIGH (ref 70–99)
Potassium: 3.6 mEq/L (ref 3.5–5.1)
Sodium: 139 mEq/L (ref 135–145)

## 2015-05-19 LAB — HEMOGLOBIN A1C: Hgb A1c MFr Bld: 6 % (ref 4.6–6.5)

## 2015-05-19 LAB — TSH: TSH: 1.1 u[IU]/mL (ref 0.35–4.50)

## 2015-05-19 NOTE — Patient Instructions (Signed)
Pulmonary Hypertension Pulmonary hypertension is high blood pressure within the arteries in your lungs (pulmonary arteries). It is different than having high blood pressure elsewhere in your body, such as blood pressure that is measured with a blood pressure cuff. Pulmonary hypertension makes it harder for blood to flow through the lungs. As a result, the heart must work harder to pump blood through the lungs, and it may be harder for you to breathe. Over time, this can weaken the heart muscle. Pulmonary hypertension is a serious condition and it can be fatal.  CAUSES Many different medical conditions can cause pulmonary hypertension. Pulmonary hypertension can be categorized by cause into five groups: Group 1 Pulmonary hypertension that is caused by abnormal growth of small blood vessels in the lungs (pulmonary arterial hypertension). The abnormal blood vessel growth may have no known cause, or it may be:  Passed along from a parent (hereditary).  Caused by another disease, such as a connective tissue disease (including lupus or scleroderma) or HIV.  Caused by certain drugs or toxins. Group 2 Pulmonary hypertension that is caused by weakness of the main chamber of the heart (left ventricle) or heart valve disease. Group 3 Pulmonary hypertension that is caused by lung disease or low oxygen levels. Causes in this group include:  Emphysema or chronic obstructive pulmonary disease (COPD).  Untreated sleep apnea.  Pulmonary fibrosis. Group 4 Pulmonary hypertension that is caused by blood clots in the lungs (pulmonary emboli).  Group 5 Other causes of pulmonary hypertension, such as sickle cell anemia, or a mix of multiple causes. SYMPTOMS Symptoms of this condition include:  Shortness of breath. You may notice shortness of breath with:  Activity, such as walking.  No activity.  Tiredness and fatigue.  Dizziness or fainting.  Rapid heartbeat or feeling your heart flutter or skip a  beat (palpitations).  Neck vein enlargement.  Bluish color to your lips and fingertips. DIAGNOSIS This condition may be diagnosed by:  Chest X-ray.  Arterial blood gases. This test checks the acidity of your blood as well as your blood oxygen and carbon dioxide levels.  CT scan. This test can provide detailed images of your lungs.  Pulmonary function test. This test measures how much air your lungs can hold. It also tests how well air moves in and out of your lungs.  Electrocardiogram (ECG). This test traces the electrical activity of your heart.  Echocardiogram. This test is used to look at your heart in motion and check how it is functioning.  Heart catheterization. This test can measure the pressure in your pulmonary artery and the right side of your heart.  Lung biopsy. This procedure involves checking a sample of lung tissue to find underlying causes. TREATMENT There is no cure for pulmonary hypertension, but treatment can help to relieve symptoms and slow the progress of the condition. Treatment can involve:  Medicines, such as:  Blood pressure medicines.  Medicines to relax (dilate) the pulmonary blood vessels.  Water pills to get rid of extra fluid (diuretic medicines).  Blood-thinning medicines.  Surgery. For severe pulmonary hypertension that does not respond to medical treatment, heart-lung or lung transplant may be needed. HOME CARE INSTRUCTIONS  Take medicines only as directed by your health care provider. These include over-the-counter medicines and prescription medicines. Take all medicines exactly as instructed. Do not change or stop medicines without first checking with your health care provider.  Do not smoke. If you need help quitting, ask your health care provider.  Eat a   healthy diet.  Limit your salt (sodium) intake to less than 2,300 mg per day.  Stay as active as possible. Exercise as directed by your health care provider. Talk with your health  care provider about what type of exercise is safe for you.  Avoid high altitudes.  Avoid hot tubs and saunas.  Avoid becoming pregnant, if this applies. Talk with your health care provider about safe methods of birth control.  Keep all follow-up visits as directed by your health care provider. This is important. SEEK IMMEDIATE MEDICAL CARE IF:  You have severe shortness of breath.  You develop chest pain or pressure in your chest.  You cough up blood.  You develop swelling of your feet or legs.  You have a significant increase in weight within 1-2 days.   This information is not intended to replace advice given to you by your health care provider. Make sure you discuss any questions you have with your health care provider.   Document Released: 05/02/2007 Document Revised: 11/19/2014 Document Reviewed: 12/25/2012 Elsevier Interactive Patient Education 2016 [employer]  

## 2015-05-19 NOTE — Progress Notes (Signed)
Pre visit review using our clinic review tool, if applicable. No additional management support is needed unless otherwise documented below in the visit note. 

## 2015-05-19 NOTE — Progress Notes (Signed)
Subjective:  Patient ID: Jessica Davenport, female    DOB: March 15, 1966  Age: 49 y.o. MRN: [id number]  CC: Anemia and Hypertension   HPI Jessica Davenport presents for follow up - she feels well and offers no complaints.  Outpatient Prescriptions Prior to Visit  Medication Sig Dispense Refill  . aspirin 81 MG tablet Take 81 mg by mouth daily.    . cetirizine (ZYRTEC) 10 MG tablet Take 1 tablet (10 mg total) by mouth 2 (two) times daily. 180 tablet 3  . Cholecalciferol 2000 UNITS TABS Take 1 tablet (2,000 Units total) by mouth daily. 90 tablet 3  . fluocinonide-emollient (LIDEX-E) 0.05 % cream Apply 1 application topically 2 (two) times daily. 60 g 3  . Glucosamine HCl 1000 MG TABS Take by mouth as needed.     Marland Kitchen omeprazole (PRILOSEC OTC) 20 MG tablet Take 80 mg by mouth daily.     Marland Kitchen torsemide (DEMADEX) 20 MG tablet Take 1 tablet (20 mg total) by mouth daily. 90 tablet 3   No facility-administered medications prior to visit.    ROS Review of Systems  Constitutional: Negative.  Negative for fever, chills, diaphoresis, appetite change and fatigue.  HENT: Negative.   Eyes: Negative.   Respiratory: Negative.  Negative for cough, choking, chest tightness, shortness of breath and stridor.   Cardiovascular: Negative.  Negative for chest pain, palpitations and leg swelling.  Gastrointestinal: Negative.  Negative for nausea, vomiting, abdominal pain, diarrhea, constipation and blood in stool.  Endocrine: Negative.   Genitourinary: Negative.  Negative for dysuria, urgency and hematuria.  Musculoskeletal: Negative.   Skin: Negative.  Negative for pallor and rash.  Allergic/Immunologic: Negative.   Neurological: Negative.  Negative for dizziness.  Hematological: Negative.  Negative for adenopathy. Does not bruise/bleed easily.  Psychiatric/Behavioral: Negative.     Objective:  BP 113/80 mmHg  Pulse 90  Temp(Src) 98.3 F (36.8 C) (Oral)  Resp 16  Ht 5' 5.5" (1.664 m)  Wt 290 lb  (131.543 kg)  BMI 47.51 kg/m2  SpO2 97%  LMP 11/10/2011  BP Readings from Last 3 Encounters:  05/19/15 113/80  01/16/15 124/78  08/19/14 120/78    Wt Readings from Last 3 Encounters:  05/19/15 290 lb (131.543 kg)  01/16/15 303 lb (137.44 kg)  08/19/14 300 lb 6 oz (136.249 kg)    Physical Exam  Constitutional: She is oriented to person, place, and time. She appears well-developed and well-nourished. No distress.  HENT:  Head: Normocephalic and atraumatic.  Mouth/Throat: Oropharynx is clear and moist. No oropharyngeal exudate.  Eyes: Conjunctivae are normal. Right eye exhibits no discharge. Left eye exhibits no discharge. No scleral icterus.  Neck: Normal range of motion. Neck supple. No JVD present. No tracheal deviation present. No thyromegaly present.  Cardiovascular: Normal rate, regular rhythm, normal heart sounds and intact distal pulses.  Exam reveals no gallop and no friction rub.   No murmur heard. Pulmonary/Chest: Effort normal and breath sounds normal. No stridor. No respiratory distress. She has no wheezes. She has no rales. She exhibits no tenderness.  Abdominal: Soft. Bowel sounds are normal. She exhibits no distension and no mass. There is no tenderness. There is no rebound and no guarding.  Musculoskeletal: Normal range of motion. She exhibits no edema or tenderness.  Lymphadenopathy:    She has no cervical adenopathy.  Neurological: She is oriented to person, place, and time.  Skin: Skin is warm and dry. No rash noted. She is not diaphoretic. No erythema. No pallor.  Vitals reviewed.   Lab Results  Component Value Date   WBC 7.9 05/19/2015   HGB 13.5 05/19/2015   HCT 41.3 05/19/2015   PLT 380.0 05/19/2015   GLUCOSE 109* 05/19/2015   CHOL 227* 05/19/2015   TRIG 108.0 05/19/2015   HDL 57.20 05/19/2015   LDLDIRECT 148.1 06/16/2012   LDLCALC 148* 05/19/2015   ALT 13 05/01/2014   AST 15 05/01/2014   NA 139 05/19/2015   K 3.6 05/19/2015   CL 100 05/19/2015    CREATININE 0.73 05/19/2015   BUN 8 05/19/2015   CO2 29 05/19/2015   TSH 1.10 05/19/2015   HGBA1C 6.0 05/19/2015    Mm Digital Screening Bilateral  07/11/2014  CLINICAL DATA:  Screening. EXAM: DIGITAL SCREENING BILATERAL MAMMOGRAM WITH CAD COMPARISON:  Previous exam(s) ACR Breast Density Category a: The breast tissue is almost entirely fatty. FINDINGS: There are no findings suspicious for malignancy. Images were processed with CAD. IMPRESSION: No mammographic evidence of malignancy. A result letter of this screening mammogram will be mailed directly to the patient. RECOMMENDATION: Screening mammogram in one year. (Code:SM-B-01Y) BI-RADS CATEGORY  1: Negative. Electronically Signed   By: Hassan Rowan M.D.   On: 07/11/2014 15:07    Assessment & Plan:   [name] was seen today for anemia and hypertension.  Diagnoses and all orders for this visit:  HYPERGLYCEMIA- she is prediabetic and will cont to work on her lifestyle modifications -     Basic metabolic panel; Future -     Hemoglobin A1c; Future  Pure hypercholesterolemia- Framingham risk score is 1% so I do not recommend a statin -     Lipid panel; Future -     TSH; Future  Iron deficiency anemia- improvement noted -     CBC with Differential/Platelet; Future  Pulmonary HTN ([place])- she does not report SOB or edema and her BP is well controlled, lytes and renal function are stable -     Basic metabolic panel; Future   I am having [name] maintain her Glucosamine HCl, aspirin, omeprazole, cetirizine, fluocinonide-emollient, torsemide, Cholecalciferol, and phentermine.  Meds ordered this encounter  Medications  . phentermine (ADIPEX-P) 37.5 MG tablet    Sig: TK 1 T PO QD    Refill:  0     Follow-up: Return in about 6 months (around 11/16/2015).  [name], MD

## 2015-05-20 ENCOUNTER — Encounter: Payer: Self-pay | Admitting: Internal Medicine

## 2016-11-25 ENCOUNTER — Encounter: Payer: Self-pay | Admitting: Interventional Cardiology

## 2016-12-01 NOTE — Progress Notes (Signed)
Cardiology Office Note   Date:  12/02/2016   ID:  [name], DOB [birth year], MRN [id number]  PCP:  Janith Lima, MD    No chief complaint on file.  Leg swelling  Wt Readings from Last 3 Encounters:  12/02/16 (!) 306 lb 8 oz (139 kg)  05/19/15 290 lb (131.5 kg)  01/16/15 (!) 303 lb (137.4 kg)       History of Present Illness: Jessica Davenport is a 51 y.o. female  who had a h/o ASD repair at age 6.  She has had longstanding issues with LE edema.  She benefits from diuretics, leg elevation and then compression stockings.     She has gained weight.  SHe got off of her diet.  She had has some DOE.  She has had some darkening of the skin on her legs.  Steroid cream  Her skin.  She has had more swelling.  She ran out of torsemide.  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    She does walk several miles several days a week [place] and from the bus[place].  No chest pain with these activities.    Past Medical History:  Diagnosis Date  . Abscess   . Allergy   . Anemia   . Fibroid, uterine   . Infected sebaceous cyst    mons pubis  . Lumbar disc disease   . Morbid obesity ([place])   . 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 Pulmonary hypertension ([place])   . Small bowel obstruction ([place]) 2013  . Vitamin D deficiency     Past Surgical History:  Procedure Laterality Date  . ABDOMINAL HYSTERECTOMY  12/07/11  . ASD REPAIR  2005/2009  . ENDOMETRIAL ABLATION  2010,2011,2012  . TUBAL LIGATION  1996  . uterine ablation       Current Outpatient Prescriptions  Medication Sig Dispense Refill  . aspirin 81 MG tablet Take 81 mg by mouth daily.    . cetirizine (ZYRTEC) 10 MG tablet Take 1 tablet (10 mg total) by mouth 2 (two) times daily. 180 tablet 3  . Glucosamine HCl 1000 MG TABS Take by mouth as needed.     Marland Kitchen omeprazole (PRILOSEC OTC) 20 MG tablet Take 80 mg by mouth daily.     . phentermine (ADIPEX-P) 37.5 MG tablet TK 1 T PO QD  0  .  torsemide (DEMADEX) 20 MG tablet Take 1 tablet (20 mg total) by mouth daily. 90 tablet 3   No current facility-administered medications for this visit.     Allergies:   Other    Social History:  The patient  reports that she has never smoked. She has never used smokeless tobacco. She reports that she does not drink alcohol or use drugs.   Family History:  The patient's family history includes Anxiety disorder in her sister; Breast cancer in her maternal aunt and mother; Cervical cancer in her maternal aunt; Colon cancer in her maternal uncle; Coronary artery disease in her father and mother; Diabetes in her father and mother; Hypertension in her father and mother; Ovarian cancer in her maternal aunt.    ROS:  Please see the history of present illness.   Otherwise, review of systems are positive for weight gain, leg swelling.   All other systems are reviewed and negative.    PHYSICAL EXAM: VS:  BP 132/78   Pulse 73   Ht 5\' 7"  (1.702 m)  Wt (!) 306 lb 8 oz (139 kg)   LMP 11/10/2011   SpO2 98%   BMI 48.00 kg/m  , BMI Body mass index is 48 kg/m. GEN: Well nourished, well developed, in no acute distress  HEENT: normal  Neck: no JVD, carotid bruits, or masses Cardiac: RRR; no murmurs, rubs, or gallops,; bilateral leg edema  Respiratory:  clear to auscultation bilaterally, normal work of breathing GI: soft, nontender, nondistended, + BS MS: no deformity or atrophy  Skin: warm and dry, ; dark rash on both legs Neuro:  Strength and sensation are intact Psych: euthymic mood, full affect   EKG:   The ekg ordered today demonstrates NSR, Nonspecific ST changes   Recent Labs: No results found for requested labs within last 8760 hours.   Lipid Panel    Component Value Date/Time   CHOL 227 (H) 05/19/2015 1452   TRIG 108.0 05/19/2015 1452   HDL 57.20 05/19/2015 1452   CHOLHDL 4 05/19/2015 1452   VLDL 21.6 05/19/2015 1452   LDLCALC 148 (H) 05/19/2015 1452   LDLDIRECT 148.1  06/16/2012 0929     Other studies Reviewed: Additional studies/ records that were reviewed today with results demonstrating: 2016 echo shows normal LV/RV size and function.   ASSESSMENT AND PLAN:  1. ASD repair: 2005 Amplatzer device.  DOing well.   2. Volume overload: restart torsemide 20 mg daily.  Elevate legs.  3. Obesity: she is interested in considering weight loss surgery. Will refer her to the bariatric clinic at [facility] long. At this point, there is no cardiac contraindication to her undergoing surgery. She has not had issues with coronary artery disease. Her ASD has been repaired and she has no signs of heart failure. 4. Check electrolytes in one week. We'll also check fasting lipids at that time.  May need to initiate lipid-lowering therapy.   Current medicines are reviewed at length with the patient today.  The patient concerns regarding her medicines were addressed.  The following changes have been made:  Restart torsemide  Labs/ tests ordered today include:  No orders of the defined types were placed in this encounter.   Recommend 150 minutes/week of aerobic exercise Low fat, low carb, high fiber diet recommended  Disposition:   FU in 6 months   Signed, Larae Grooms, MD  12/02/2016 3:12 PM    [facility] [address], [place], [place]  [address] Phone: [phone]; Fax: [phone]

## 2016-12-02 ENCOUNTER — Ambulatory Visit (INDEPENDENT_AMBULATORY_CARE_PROVIDER_SITE_OTHER): Payer: 59 | Admitting: Interventional Cardiology

## 2016-12-02 ENCOUNTER — Encounter: Payer: Self-pay | Admitting: Interventional Cardiology

## 2016-12-02 VITALS — BP 132/78 | HR 73 | Ht 67.0 in | Wt 306.5 lb

## 2016-12-02 DIAGNOSIS — Q211 Atrial septal defect, unspecified: Secondary | ICD-10-CM

## 2016-12-02 DIAGNOSIS — R600 Localized edema: Secondary | ICD-10-CM | POA: Diagnosis not present

## 2016-12-02 DIAGNOSIS — Z79899 Other long term (current) drug therapy: Secondary | ICD-10-CM | POA: Diagnosis not present

## 2016-12-02 DIAGNOSIS — E785 Hyperlipidemia, unspecified: Secondary | ICD-10-CM | POA: Diagnosis not present

## 2016-12-02 HISTORY — DX: Localized edema: R60.0

## 2016-12-02 MED ORDER — TORSEMIDE 20 MG PO TABS: 20.0000 mg | ORAL_TABLET | Freq: Every day | ORAL | 3 refills | Status: DC

## 2016-12-02 NOTE — Patient Instructions (Signed)
Medication Instructions:  Your physician recommends that you continue on your current medications as directed. Please refer to the Current Medication list given to you today.   Labwork: Your physician recommends that you return for lab work on 12/10/2016 for FASTING lipids, CMET   Testing/Procedures: None ordered.  Follow-Up: Your physician wants you to follow-up in: 6 months with Dr. Irish Lack. You will receive a reminder letter in the mail two months in advance. If you don't receive a letter, please call our office to schedule the follow-up appointment.   You have been referred to [facility] for Weight Loss Surgery.   Any Other Special Instructions Will Be Listed Below (If Applicable).      If you need a refill on your cardiac medications before your next appointment, please call your pharmacy.

## 2016-12-10 ENCOUNTER — Other Ambulatory Visit: Payer: 59

## 2016-12-10 ENCOUNTER — Other Ambulatory Visit: Payer: 59 | Admitting: *Deleted

## 2016-12-10 DIAGNOSIS — Z79899 Other long term (current) drug therapy: Secondary | ICD-10-CM

## 2016-12-10 DIAGNOSIS — E785 Hyperlipidemia, unspecified: Secondary | ICD-10-CM

## 2016-12-10 LAB — LIPID PANEL
Chol/HDL Ratio: 4.1 ratio (ref 0.0–4.4)
Cholesterol, Total: 193 mg/dL (ref 100–199)
HDL: 47 mg/dL
LDL Calculated: 124 mg/dL — ABNORMAL HIGH (ref 0–99)
Triglycerides: 112 mg/dL (ref 0–149)
VLDL Cholesterol Cal: 22 mg/dL (ref 5–40)

## 2016-12-10 LAB — COMPREHENSIVE METABOLIC PANEL
ALK PHOS: 65 IU/L (ref 39–117)
ALT: 8 IU/L (ref 0–32)
AST: 12 IU/L (ref 0–40)
Albumin/Globulin Ratio: 1.4 (ref 1.2–2.2)
Albumin: 3.6 g/dL (ref 3.5–5.5)
BUN/Creatinine Ratio: 14 (ref 9–23)
BUN: 11 mg/dL (ref 6–24)
Bilirubin Total: 0.4 mg/dL (ref 0.0–1.2)
CHLORIDE: 102 mmol/L (ref 96–106)
CO2: 26 mmol/L (ref 18–29)
CREATININE: 0.76 mg/dL (ref 0.57–1.00)
Calcium: 9.1 mg/dL (ref 8.7–10.2)
GFR calc Af Amer: 107 mL/min/{1.73_m2} (ref >59)
GFR calc non Af Amer: 93 mL/min/{1.73_m2} (ref >59)
GLUCOSE: 103 mg/dL — AB (ref 65–99)
Globulin, Total: 2.5 g/dL (ref 1.5–4.5)
Potassium: 4 mmol/L (ref 3.5–5.2)
SODIUM: 142 mmol/L (ref 134–144)
Total Protein: 6.1 g/dL (ref 6.0–8.5)

## 2016-12-14 ENCOUNTER — Telehealth: Payer: Self-pay | Admitting: Interventional Cardiology

## 2016-12-14 NOTE — Telephone Encounter (Signed)
New Message   pt verbalized that she is returning call for rn   About her lab results she cant pull up her vm

## 2016-12-14 NOTE — Telephone Encounter (Signed)
Patient calling back and states that she cannot access her voicemail to hear the results of her labs. Patient made aware of results. Patient verbalizes understanding. Patient thanked me for the call.

## 2016-12-14 NOTE — Telephone Encounter (Signed)
-----   Message from Jettie Booze, MD sent at 12/13/2016  9:43 AM EDT ----- Lipids, liver and electrolytes well controlled. COntinue current meds and healthy lifestyle.

## 2017-03-31 LAB — HM PAP SMEAR

## 2017-11-16 ENCOUNTER — Other Ambulatory Visit: Payer: Self-pay

## 2017-11-16 DIAGNOSIS — R600 Localized edema: Secondary | ICD-10-CM

## 2017-11-16 MED ORDER — TORSEMIDE 20 MG PO TABS: 20.0000 mg | ORAL_TABLET | Freq: Every day | ORAL | 0 refills | Status: DC

## 2018-02-08 ENCOUNTER — Other Ambulatory Visit: Payer: Self-pay | Admitting: Interventional Cardiology

## 2018-02-08 DIAGNOSIS — R600 Localized edema: Secondary | ICD-10-CM

## 2018-02-10 ENCOUNTER — Other Ambulatory Visit: Payer: Self-pay | Admitting: Obstetrics and Gynecology

## 2018-02-10 ENCOUNTER — Ambulatory Visit
Admission: RE | Admit: 2018-02-10 | Discharge: 2018-02-10 | Disposition: A | Discharge status: Home / Self Care | Payer: 59 | Source: Ambulatory Visit

## 2018-02-10 DIAGNOSIS — Z1231 Encounter for screening mammogram for malignant neoplasm of breast: Secondary | ICD-10-CM

## 2018-02-10 LAB — HM MAMMOGRAPHY

## 2018-03-23 ENCOUNTER — Ambulatory Visit: Payer: 59 | Admitting: Cardiology

## 2018-05-04 NOTE — Progress Notes (Signed)
Cardiology Office Note   Date:  05/05/2018   ID:  Jessica Davenport, DOB [birth year], MRN [id number]  PCP:  Servando Salina, MD  Cardiologist:  DR. Irish Lack     Chief Complaint  Patient presents with  . Atrial Septal Defect    repair and edema      History of Present Illness: Jessica Davenport is a 52 y.o. female who presents for ASD repair and lower ext edema. She has a h/o ASD repair at age 31.  She has had longstanding issues with LE edema. She benefits from diuretics, leg elevation and then compression stockings.    She has gained weight.  SHe got off of her diet.  She had has some DOE.  She has had some darkening of the skin on her legs.  Steroid cream  Her skin.  She had more swelling on prior visits with running out of torsemide.   Last saw Dr. Beau Fanny  11/2016  Last echo 2016 EF 60 to 65%   No chest pain and no SOB.  Her edema has increased, she has stopped torsemide due to leg cramps.  She has been on lasix in the past but it stopped working.  She would like to try again.   She is planning for bariatric surgery in near future, her insurance will pay.  She has fallen off diet at times.      Past Medical History:  Diagnosis Date  . Abscess   . Allergy   . Anemia   . Bilateral lower extremity edema 12/02/2016  . Fibroid, uterine   . Infected sebaceous cyst    mons pubis  . Iron deficiency anemia 08/19/2009       . Lumbar disc disease   . Morbid obesity ([place])   . Obesity, Class III, BMI 40-49.9 (morbid obesity) ([place]) 02/15/2012  . 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 Pulmonary HTN ([place]) 08/20/2009       . Pulmonary hypertension ([place])   . Pure hypercholesterolemia 06/16/2012  . Small bowel obstruction ([place]) 2013  . Vitamin D deficiency   . Vitamin D deficiency 08/19/2009   Qualifier: Diagnosis of  By: Jenny Reichmann MD, Hunt Oris     Past Surgical History:  Procedure Laterality Date  .  ABDOMINAL HYSTERECTOMY  12/07/11  . ASD REPAIR  2005/2009  . ENDOMETRIAL ABLATION  2010,2011,2012  . TUBAL LIGATION  1996  . uterine ablation       Current Outpatient Medications  Medication Sig Dispense Refill  . aspirin 81 MG tablet Take 81 mg by mouth daily.    . cetirizine (ZYRTEC) 10 MG tablet Take 1 tablet (10 mg total) by mouth 2 (two) times daily. 180 tablet 3  . Glucosamine HCl 1000 MG TABS Take by mouth as needed.     . furosemide (LASIX) 40 MG tablet Take 1 tablet (40 mg total) by mouth daily. 90 tablet 3  . potassium chloride SA (K-DUR,KLOR-CON) 20 MEQ tablet Take 1 tablet (20 mEq total) by mouth daily. 90 tablet 3   No current facility-administered medications for this visit.     Allergies:   Other    Social History:  The patient  reports that she has never smoked. She has never used smokeless tobacco. She reports that she does not drink alcohol or use drugs.   Family History:  The patient's family history includes Anxiety disorder in her sister; Breast cancer in  her maternal aunt and mother; Cervical cancer in her maternal aunt; Colon cancer in her maternal uncle; Coronary artery disease in her father and mother; Diabetes in her father and mother; Hypertension in her father and mother; Ovarian cancer in her maternal aunt.    ROS:  General:no colds or fevers, no weight changes Skin:no rashes or ulcers HEENT:no blurred vision, no congestion CV:see HPI PUL:see HPI GI:no diarrhea constipation or melena, no indigestion GU:no hematuria, no dysuria MS:no joint pain, no claudication Neuro:no syncope, no lightheadedness Endo:no diabetes, no thyroid disease  Wt Readings from Last 3 Encounters:  05/05/18 (!) 309 lb (140.2 kg)  12/02/16 (!) 306 lb 8 oz (139 kg)  05/19/15 290 lb (131.5 kg)     PHYSICAL EXAM: VS:  BP (!) 146/90   Pulse 84   Ht 5\' 7"  (1.702 m)   Wt (!) 309 lb (140.2 kg)   LMP 11/10/2011   BMI 48.40 kg/m  , BMI Body mass index is 48.4  kg/m. General:Pleasant affect, NAD Skin:Warm and dry, brisk capillary refill HEENT:normocephalic, sclera clear, mucus membranes moist Neck:supple, no JVD, no bruits  Heart:S1S2 RRR without murmur, gallup, rub or click Lungs:clear without rales, rhonchi, or wheezes YKD:XIPJ, non tender, + BS, do not palpate liver spleen or masses Ext:1+ lower ext edema, 2+ pedal pulses, 2+ radial pulses Neuro:alert and oriented X 3, MAE, follows commands, + facial symmetry    EKG:  EKG is ordered today. The ekg ordered today demonstrates SR non specific T wave abnormality but no changes at all from piror EKGs   Recent Labs: 05/05/2018: ALT 16; BUN 11; Creatinine, Ser 0.74; Magnesium 2.0; Potassium 3.9; Sodium 142; TSH 2.130    Lipid Panel    Component Value Date/Time   CHOL 193 12/10/2016 0802   TRIG 112 12/10/2016 0802   HDL 47 12/10/2016 0802   CHOLHDL 4.1 12/10/2016 0802   CHOLHDL 4 05/19/2015 1452   VLDL 21.6 05/19/2015 1452   LDLCALC 124 (H) 12/10/2016 0802   LDLDIRECT 148.1 06/16/2012 0929       Other studies Reviewed: Additional studies/ records that were reviewed today include: . Echo   Study Conclusions  - Left ventricle: The cavity size was normal. Wall thickness was normal. Systolic function was normal. The estimated ejection fraction was in the range of 60% to 65%. Wall motion was normal; there were no regional wall motion abnormalities.  ASSESSMENT AND PLAN:  1.  ASD repair 2005 amplatzer device, doing well. Mild edema   2.  Lower ext edema not wanting to take torsemide.  Will change to lasix and see how she does.  Check BMP and mg+ today.  3.  Obesity planning on bariatric sureger, per Dr. Irish Lack " At this point, there is no cardiac contraindication to her undergoing surgery. She has not had issues with coronary artery disease. Her ASD has been repaired and she has no signs of heart failure.:  And conitinues with clear lungs  4.  Follow up with Dr. Irish Lack  in 1 year.   Current medicines are reviewed with the patient today.  The patient Has no concerns regarding medicines.  The following changes have been made:  See above Labs/ tests ordered today include:see above  Disposition:   FU:  see above  Signed, Cecilie Kicks, NP  05/05/2018 5:52 PM    [facility], [place], [place], [place] Phone: [phone]; Fax: [phone]

## 2018-05-05 ENCOUNTER — Ambulatory Visit: Payer: 59 | Admitting: Cardiology

## 2018-05-05 ENCOUNTER — Encounter: Payer: Self-pay | Admitting: Cardiology

## 2018-05-05 VITALS — BP 146/90 | HR 84 | Ht 67.0 in | Wt 309.0 lb

## 2018-05-05 DIAGNOSIS — R5383 Other fatigue: Secondary | ICD-10-CM

## 2018-05-05 DIAGNOSIS — Q211 Atrial septal defect, unspecified: Secondary | ICD-10-CM

## 2018-05-05 DIAGNOSIS — Z79899 Other long term (current) drug therapy: Secondary | ICD-10-CM

## 2018-05-05 DIAGNOSIS — R600 Localized edema: Secondary | ICD-10-CM | POA: Diagnosis not present

## 2018-05-05 DIAGNOSIS — E785 Hyperlipidemia, unspecified: Secondary | ICD-10-CM | POA: Diagnosis not present

## 2018-05-05 LAB — MAGNESIUM: Magnesium: 2 mg/dL (ref 1.6–2.3)

## 2018-05-05 LAB — COMPREHENSIVE METABOLIC PANEL
ALBUMIN: 3.7 g/dL (ref 3.5–5.5)
ALT: 16 IU/L (ref 0–32)
AST: 12 IU/L (ref 0–40)
Albumin/Globulin Ratio: 1.7 (ref 1.2–2.2)
Alkaline Phosphatase: 67 IU/L (ref 39–117)
BUN / CREAT RATIO: 15 (ref 9–23)
BUN: 11 mg/dL (ref 6–24)
Bilirubin Total: 0.3 mg/dL (ref 0.0–1.2)
CALCIUM: 9.4 mg/dL (ref 8.7–10.2)
CO2: 25 mmol/L (ref 20–29)
CREATININE: 0.74 mg/dL (ref 0.57–1.00)
Chloride: 102 mmol/L (ref 96–106)
GFR calc Af Amer: 109 mL/min/{1.73_m2} (ref >59)
GFR, EST NON AFRICAN AMERICAN: 95 mL/min/{1.73_m2} (ref >59)
GLOBULIN, TOTAL: 2.2 g/dL (ref 1.5–4.5)
GLUCOSE: 98 mg/dL (ref 65–99)
Potassium: 3.9 mmol/L (ref 3.5–5.2)
SODIUM: 142 mmol/L (ref 134–144)
Total Protein: 5.9 g/dL — ABNORMAL LOW (ref 6.0–8.5)

## 2018-05-05 LAB — TSH: TSH: 2.13 u[IU]/mL (ref 0.450–4.500)

## 2018-05-05 MED ORDER — FUROSEMIDE 40 MG PO TABS: 40.0000 mg | ORAL_TABLET | Freq: Every day | ORAL | 3 refills | Status: DC

## 2018-05-05 MED ORDER — POTASSIUM CHLORIDE CRYS ER 20 MEQ PO TBCR: 20.0000 meq | EXTENDED_RELEASE_TABLET | Freq: Every day | ORAL | 3 refills | Status: DC

## 2018-05-05 NOTE — Patient Instructions (Addendum)
Medication Instructions:  Your physician has recommended you make the following change in your medication:  1.  START Lasix 40 mg daily 2.  START Potassium 20 meq daily  If you need a refill on your cardiac medications before your next appointment, please call your pharmacy.   Lab work: TODAY:  CMET, TSH, & MAGNESIUM  If you have labs (blood work) drawn today and your tests are completely normal, you will receive your results only by: Marland Kitchen MyChart Message (if you have MyChart) OR . A paper copy in the mail If you have any lab test that is abnormal or we need to change your treatment, we will call you to review the results.  Testing/Procedures: None ordered  Follow-Up: At [facility], you and your health needs are our priority.  As part of our continuing mission to provide you with exceptional heart care, we have created designated Provider Care Teams.  These Care Teams include your primary Cardiologist (physician) and Advanced Practice Providers (APPs -  Physician Assistants and Nurse Practitioners) who all work together to provide you with the care you need, when you need it. You will need a follow up appointment in 1 years.  Please call our office 2 months in advance to schedule this appointment.  You may see Dr. Irish Lack or one of the following Advanced Practice Providers on your designated Care Team:   [facility], PA-C Melina Copa, PA-C . Ermalinda Barrios, PA-C  Any Other Special Instructions Will Be Listed Below (If Applicable). Make sure you get the compression stockings and wear them.

## 2018-05-08 ENCOUNTER — Telehealth: Payer: Self-pay | Admitting: *Deleted

## 2018-05-08 NOTE — Telephone Encounter (Signed)
Pt called back re: lab results.  She has been notified and she verbalized understanding.

## 2018-05-08 NOTE — Telephone Encounter (Signed)
-----   Message from Isaiah Serge, NP sent at 05/05/2018  6:02 PM EDT ----- Labs are stable all normal.  Including thyroid.

## 2018-07-17 ENCOUNTER — Ambulatory Visit: Payer: Self-pay

## 2018-07-17 ENCOUNTER — Encounter: Payer: Self-pay | Admitting: Nurse Practitioner

## 2018-07-17 ENCOUNTER — Ambulatory Visit: Payer: 59 | Admitting: Nurse Practitioner

## 2018-07-17 VITALS — BP 138/82 | HR 79 | Temp 98.0°F | Ht 67.0 in | Wt 315.2 lb

## 2018-07-17 DIAGNOSIS — R600 Localized edema: Secondary | ICD-10-CM | POA: Diagnosis not present

## 2018-07-17 DIAGNOSIS — J209 Acute bronchitis, unspecified: Secondary | ICD-10-CM | POA: Diagnosis not present

## 2018-07-17 MED ORDER — LEVOFLOXACIN 500 MG PO TABS: 500.0000 mg | ORAL_TABLET | Freq: Every day | ORAL | 0 refills | Status: DC

## 2018-07-17 MED ORDER — ALBUTEROL SULFATE HFA 108 (90 BASE) MCG/ACT IN AERS: 1.0000 | INHALATION_SPRAY | Freq: Four times a day (QID) | RESPIRATORY_TRACT | 0 refills | Status: DC | PRN

## 2018-07-17 MED ORDER — METHYLPREDNISOLONE ACETATE 40 MG/ML IJ SUSP
40.0000 mg | Freq: Once | INTRAMUSCULAR | Status: AC
  Administered 2018-07-17: 40 mg via INTRAMUSCULAR

## 2018-07-17 MED ORDER — HYDROCODONE-HOMATROPINE 5-1.5 MG/5ML PO SYRP: 5.0000 mL | ORAL_SOLUTION | Freq: Two times a day (BID) | ORAL | 0 refills | Status: DC | PRN

## 2018-07-17 MED ORDER — GUAIFENESIN-DM 100-10 MG/5ML PO SYRP: 5.0000 mL | ORAL_SOLUTION | ORAL | 0 refills | Status: DC | PRN

## 2018-07-17 MED ORDER — IPRATROPIUM-ALBUTEROL 0.5-2.5 (3) MG/3ML IN SOLN
3.0000 mL | Freq: Once | RESPIRATORY_TRACT | Status: AC
  Administered 2018-07-17: 3 mL via RESPIRATORY_TRACT

## 2018-07-17 NOTE — Telephone Encounter (Signed)
Pt. Reports she started having cough 07/03/18 and had a virtual visit. Was prescribed Z-Pack,Mucinex and Flonase. Has not gotten any better. Non-productive cough with wheezing and headache. No availability at [facility] location. Appointment made at [facility]   Reason for Disposition . [1] Continuous (nonstop) coughing interferes with work or school AND [2] no improvement using cough treatment per protocol  Answer Assessment - Initial Assessment Questions 1. ONSET: "When did the cough begin?"      Started 07/02/18 2. SEVERITY: "How bad is the cough today?"      Moderate 3. RESPIRATORY DISTRESS: "Describe your breathing."      Shortness of breath with coughing 4. FEVER: "Do you have a fever?" If so, ask: "What is your temperature, how was it measured, and when did it start?"     Unsure 5. HEMOPTYSIS: "Are you coughing up any blood?" If so ask: "How much?" (flecks, streaks, tablespoons, etc.)     No 6. TREATMENT: "What have you done so far to treat the cough?" (e.g., meds, fluids, humidifier)     Z-pak, Mucinex, Flonase 7. CARDIAC HISTORY: "Do you have any history of heart disease?" (e.g., heart attack, congestive heart failure)      ASD Closure 8. LUNG HISTORY: "Do you have any history of lung disease?"  (e.g., pulmonary embolus, asthma, emphysema)     No 9. PE RISK FACTORS: "Do you have a history of blood clots?" (or: recent major surgery, recent prolonged travel, bedridden)     No 10. OTHER SYMPTOMS: "Do you have any other symptoms? (e.g., runny nose, wheezing, chest pain)       Wheezing and headache 11. PREGNANCY: "Is there any chance you are pregnant?" "When was your last menstrual period?"       No 12. TRAVEL: "Have you traveled out of the country in the last month?" (e.g., travel history, exposures)       No  Protocols used: COUGH - ACUTE NON-PRODUCTIVE-A-AH

## 2018-07-17 NOTE — Progress Notes (Signed)
Subjective:  Patient ID: MONACA WADAS, female    DOB: 01-31-1966  Age: 52 y.o. MRN: [id number]  CC: Cough (Sore throat, achy ears, wheezing, non productive cough,fatigue/ started two weeks ago/got antibiotics (zpac) from [facility] and oct :mucinex,ibuprofen /no changes/ FYI will call Dr. Ronnald Ramp office to get appt. )  Cough  This is a new problem. The current episode started 1 to 4 weeks ago. The problem has been waxing and waning. The cough is productive of purulent sputum. Associated symptoms include chest pain, chills, headaches, myalgias, nasal congestion, postnasal drip, rhinorrhea, a sore throat, shortness of breath and wheezing. Pertinent negatives include no heartburn. The symptoms are aggravated by cold air, lying down and fumes. She has tried OTC cough suppressant for the symptoms. The treatment provided no relief. Her past medical history is significant for environmental allergies.   completed azithromycin 07/07/2018, no improvement. Has been 8ff furosemide x 2weeks.  Reviewed past Medical, Social and Family history today.  Outpatient Medications Prior to Visit  Medication Sig Dispense Refill  . aspirin 81 MG tablet Take 81 mg by mouth daily.    . cetirizine (ZYRTEC) 10 MG tablet Take 1 tablet (10 mg total) by mouth 2 (two) times daily. 180 tablet 3  . furosemide (LASIX) 40 MG tablet Take 1 tablet (40 mg total) by mouth daily. 90 tablet 3  . Glucosamine HCl 1000 MG TABS Take by mouth as needed.     . potassium chloride SA (K-DUR,KLOR-CON) 20 MEQ tablet Take 1 tablet (20 mEq total) by mouth daily. 90 tablet 3   No facility-administered medications prior to visit.     ROS See HPI  Objective:  BP 138/82   Pulse 79   Temp 98 F (36.7 C) (Oral)   Ht 5\' 7"  (1.702 m)   Wt (!) 315 lb 3.2 oz (143 kg)   LMP 11/10/2011   SpO2 97%   BMI 49.37 kg/m   BP Readings from Last 3 Encounters:  07/17/18 138/82  05/05/18 (!) 146/90  12/02/16 132/78    Wt Readings from Last 3  Encounters:  07/17/18 (!) 315 lb 3.2 oz (143 kg)  05/05/18 (!) 309 lb (140.2 kg)  12/02/16 (!) 306 lb 8 oz (139 kg)    Physical Exam Vitals signs reviewed.  Cardiovascular:     Rate and Rhythm: Normal rate and regular rhythm.  Pulmonary:     Effort: Pulmonary effort is normal. No respiratory distress.     Breath sounds: Wheezing and rales present.     Comments: Resolved wheezing but persistent rales post nebulizer treatment. Musculoskeletal:     Right lower leg: Edema present.     Left lower leg: Edema present.  Skin:    Findings: No erythema.  Neurological:     General: No focal deficit present.     Mental Status: She is oriented to person, place, and time.     Lab Results  Component Value Date   WBC 7.9 05/19/2015   HGB 13.5 05/19/2015   HCT 41.3 05/19/2015   PLT 380.0 05/19/2015   GLUCOSE 98 05/05/2018   CHOL 193 12/10/2016   TRIG 112 12/10/2016   HDL 47 12/10/2016   LDLDIRECT 148.1 06/16/2012   LDLCALC 124 (H) 12/10/2016   ALT 16 05/05/2018   AST 12 05/05/2018   NA 142 05/05/2018   K 3.9 05/05/2018   CL 102 05/05/2018   CREATININE 0.74 05/05/2018   BUN 11 05/05/2018   CO2 25 05/05/2018   TSH  2.130 05/05/2018   HGBA1C 6.0 05/19/2015   Mm 3d Screen Breast Bilateral  Result Date: 02/13/2018 CLINICAL DATA:  Screening. EXAM: DIGITAL SCREENING BILATERAL MAMMOGRAM WITH TOMO AND CAD COMPARISON:  Previous exam(s). ACR Breast Density Category a: The breast tissue is almost entirely fatty. FINDINGS: There are no findings suspicious for malignancy. Images were processed with CAD. IMPRESSION: No mammographic evidence of malignancy. A result letter of this screening mammogram will be mailed directly to the patient. RECOMMENDATION: Screening mammogram in one year. (Code:SM-B-01Y) BI-RADS CATEGORY  2: Benign. Electronically Signed   By: Evangeline Dakin M.D.   On: 02/13/2018 08:08    Assessment & Plan:   Thirza was seen today for cough.  Diagnoses and all orders for  this visit:  Acute bronchitis, unspecified organism -     methylPREDNISolone acetate (DEPO-MEDROL) injection 40 mg -     ipratropium-albuterol (DUONEB) 0.5-2.5 (3) MG/3ML nebulizer solution 3 mL -     levofloxacin (LEVAQUIN) 500 MG tablet; Take 1 tablet (500 mg total) by mouth daily. -     HYDROcodone-homatropine (HYCODAN) 5-1.5 MG/5ML syrup; Take 5 mLs by mouth every 12 (twelve) hours as needed for cough. -     albuterol (PROVENTIL HFA;VENTOLIN HFA) 108 (90 Base) MCG/ACT inhaler; Inhale 1-2 puffs into the lungs every 6 (six) hours as needed. -     guaiFENesin-dextromethorphan (ROBITUSSIN DM) 100-10 MG/5ML syrup; Take 5 mLs by mouth every 4 (four) hours as needed for cough.  Bilateral lower extremity edema   I am having Nastassja Y. Martine start on levofloxacin, HYDROcodone-homatropine, albuterol, and guaiFENesin-dextromethorphan. I am also having her maintain her Glucosamine HCl, aspirin, cetirizine, furosemide, and potassium chloride SA. We administered ipratropium-albuterol. We will continue to administer methylPREDNISolone acetate.  Meds ordered this encounter  Medications  . methylPREDNISolone acetate (DEPO-MEDROL) injection 40 mg  . ipratropium-albuterol (DUONEB) 0.5-2.5 (3) MG/3ML nebulizer solution 3 mL  . levofloxacin (LEVAQUIN) 500 MG tablet    Sig: Take 1 tablet (500 mg total) by mouth daily.    Dispense:  7 tablet    Refill:  0    Order Specific Question:   Supervising Provider    Answer:   Lucille Passy [[id number]]  . HYDROcodone-homatropine (HYCODAN) 5-1.5 MG/5ML syrup    Sig: Take 5 mLs by mouth every 12 (twelve) hours as needed for cough.    Dispense:  60 mL    Refill:  0    Order Specific Question:   Supervising Provider    Answer:   Lucille Passy [[id number]]  . albuterol (PROVENTIL HFA;VENTOLIN HFA) 108 (90 Base) MCG/ACT inhaler    Sig: Inhale 1-2 puffs into the lungs every 6 (six) hours as needed.    Dispense:  1 Inhaler    Refill:  0    Order Specific Question:    Supervising Provider    Answer:   Lucille Passy [[id number]]  . guaiFENesin-dextromethorphan (ROBITUSSIN DM) 100-10 MG/5ML syrup    Sig: Take 5 mLs by mouth every 4 (four) hours as needed for cough.    Dispense:  118 mL    Refill:  0    Order Specific Question:   Supervising Provider    Answer:   Lucille Passy [[id number]]    Problem List Items Addressed This Visit      Other   Bilateral lower extremity edema    Other Visit Diagnoses    Acute bronchitis, unspecified organism    -  Primary   Relevant Medications   methylPREDNISolone acetate (  DEPO-MEDROL) injection 40 mg   ipratropium-albuterol (DUONEB) 0.5-2.5 (3) MG/3ML nebulizer solution 3 mL (Completed)   levofloxacin (LEVAQUIN) 500 MG tablet   HYDROcodone-homatropine (HYCODAN) 5-1.5 MG/5ML syrup   albuterol (PROVENTIL HFA;VENTOLIN HFA) 108 (90 Base) MCG/ACT inhaler   guaiFENesin-dextromethorphan (ROBITUSSIN DM) 100-10 MG/5ML syrup       Follow-up: No follow-ups on file.  Wilfred Lacy, NP

## 2018-07-17 NOTE — Patient Instructions (Addendum)
Resume furosemide: 1.5tab once a day x 2days, then return to 1tab daily continuous. Resume potassium as well.  Return to office if no improvement in 3days.  Maintain DASH diet.   Acute Bronchitis, Adult Acute bronchitis is when air tubes (bronchi) in the lungs suddenly get swollen. The condition can make it hard to breathe. It can also cause these symptoms:  A cough.  Coughing up clear, yellow, or green mucus.  Wheezing.  Chest congestion.  Shortness of breath.  A fever.  Body aches.  Chills.  A sore throat. Follow these instructions at home:  Medicines  Take over-the-counter and prescription medicines only as told by your doctor.  If you were prescribed an antibiotic medicine, take it as told by your doctor. Do not stop taking the antibiotic even if you start to feel better. General instructions  Rest.  Drink enough fluids to keep your pee (urine) pale yellow.  Avoid smoking and secondhand smoke. If you smoke and you need help quitting, ask your doctor. Quitting will help your lungs heal faster.  Use an inhaler, cool mist vaporizer, or humidifier as told by your doctor.  Keep all follow-up visits as told by your doctor. This is important. How is this prevented? To lower your risk of getting this condition again:  Wash your hands often with soap and water. If you cannot use soap and water, use hand sanitizer.  Avoid contact with people who have cold symptoms.  Try not to touch your hands to your mouth, nose, or eyes.  Make sure to get the flu shot every year. Contact a doctor if:  Your symptoms do not get better in 2 weeks. Get help right away if:  You cough up blood.  You have chest pain.  You have very bad shortness of breath.  You become dehydrated.  You faint (pass out) or keep feeling like you are going to pass out.  You keep throwing up (vomiting).  You have a very bad headache.  Your fever or chills gets worse. This information is not  intended to replace advice given to you by your health care provider. Make sure you discuss any questions you have with your health care provider. Document Released: 12/22/2007 Document Revised: 02/16/2017 Document Reviewed: 12/24/2015 Elsevier Interactive Patient Education  2019 [employer].

## 2018-07-21 ENCOUNTER — Encounter: Payer: Self-pay | Admitting: Nurse Practitioner

## 2018-07-21 ENCOUNTER — Ambulatory Visit: Payer: 59 | Admitting: Nurse Practitioner

## 2018-07-21 VITALS — BP 134/82 | HR 82 | Temp 98.2°F | Ht 67.0 in | Wt 309.8 lb

## 2018-07-21 DIAGNOSIS — R519 Headache, unspecified: Secondary | ICD-10-CM

## 2018-07-21 DIAGNOSIS — R51 Headache: Secondary | ICD-10-CM

## 2018-07-21 LAB — CBC WITH DIFFERENTIAL/PLATELET
BASOS ABS: 0.1 10*3/uL (ref 0.0–0.1)
BASOS PCT: 0.9 % (ref 0.0–3.0)
EOS ABS: 0.2 10*3/uL (ref 0.0–0.7)
Eosinophils Relative: 2.8 % (ref 0.0–5.0)
HCT: 41.2 % (ref 36.0–46.0)
Hemoglobin: 13.3 g/dL (ref 12.0–15.0)
Lymphocytes Relative: 35.1 % (ref 12.0–46.0)
Lymphs Abs: 2.6 10*3/uL (ref 0.7–4.0)
MCHC: 32.2 g/dL (ref 30.0–36.0)
MCV: 87 fl (ref 78.0–100.0)
Monocytes Absolute: 0.3 10*3/uL (ref 0.1–1.0)
Monocytes Relative: 4.1 % (ref 3.0–12.0)
NEUTROS ABS: 4.2 10*3/uL (ref 1.4–7.7)
NEUTROS PCT: 57.1 % (ref 43.0–77.0)
PLATELETS: 309 10*3/uL (ref 150.0–400.0)
RBC: 4.74 Mil/uL (ref 3.87–5.11)
RDW: 14.2 % (ref 11.5–15.5)
WBC: 7.4 10*3/uL (ref 4.0–10.5)

## 2018-07-21 LAB — BASIC METABOLIC PANEL
BUN: 12 mg/dL (ref 6–23)
CHLORIDE: 104 meq/L (ref 96–112)
CO2: 31 mEq/L (ref 19–32)
Calcium: 8.8 mg/dL (ref 8.4–10.5)
Creatinine, Ser: 0.72 mg/dL (ref 0.40–1.20)
GFR: 110.13 mL/min (ref >60.00)
Glucose, Bld: 101 mg/dL — ABNORMAL HIGH (ref 70–99)
POTASSIUM: 3.7 meq/L (ref 3.5–5.1)
Sodium: 142 mEq/L (ref 135–145)

## 2018-07-21 LAB — SEDIMENTATION RATE: Sed Rate: 34 mm/hr — ABNORMAL HIGH (ref 0–30)

## 2018-07-21 MED ORDER — ONDANSETRON HCL 4 MG PO TABS: 4.0000 mg | ORAL_TABLET | Freq: Three times a day (TID) | ORAL | 0 refills | Status: DC | PRN

## 2018-07-21 MED ORDER — SUMATRIPTAN SUCCINATE 25 MG PO TABS: ORAL_TABLET | ORAL | 0 refills | Status: DC

## 2018-07-21 NOTE — Progress Notes (Signed)
Subjective:  Patient ID: Jessica Davenport, female    DOB: 11-26-1965  Age: 53 y.o. MRN: [id number]  CC: Headache (still wheezing,headache, little productive cough clear mucus// not feeling any better )   Headache   This is a new problem. The current episode started in the past 7 days. The problem occurs constantly. The problem has been unchanged. The pain is located in the bilateral and frontal region. The pain does not radiate. The pain quality is not similar to prior headaches. The quality of the pain is described as aching. Associated symptoms include drainage, nausea, photophobia, rhinorrhea and sinus pressure. Pertinent negatives include no anorexia, blurred vision, dizziness, fever, insomnia, loss of balance, neck pain, tinnitus or visual change. She has tried acetaminophen and NSAIDs for the symptoms. Her past medical history is significant for hypertension and obesity. There is no history of cluster headaches, migraine headaches or recent head traumas.   Reviewed past Medical, Social and Family history today.  Outpatient Medications Prior to Visit  Medication Sig Dispense Refill  . albuterol (PROVENTIL HFA;VENTOLIN HFA) 108 (90 Base) MCG/ACT inhaler Inhale 1-2 puffs into the lungs every 6 (six) hours as needed. 1 Inhaler 0  . aspirin 81 MG tablet Take 81 mg by mouth daily.    . cetirizine (ZYRTEC) 10 MG tablet Take 1 tablet (10 mg total) by mouth 2 (two) times daily. 180 tablet 3  . furosemide (LASIX) 40 MG tablet Take 1 tablet (40 mg total) by mouth daily. 90 tablet 3  . Glucosamine HCl 1000 MG TABS Take by mouth as needed.     Marland Kitchen guaiFENesin-dextromethorphan (ROBITUSSIN DM) 100-10 MG/5ML syrup Take 5 mLs by mouth every 4 (four) hours as needed for cough. 118 mL 0  . HYDROcodone-homatropine (HYCODAN) 5-1.5 MG/5ML syrup Take 5 mLs by mouth every 12 (twelve) hours as needed for cough. 60 mL 0  . levofloxacin (LEVAQUIN) 500 MG tablet Take 1 tablet (500 mg total) by mouth daily. 7 tablet 0   . potassium chloride SA (K-DUR,KLOR-CON) 20 MEQ tablet Take 1 tablet (20 mEq total) by mouth daily. 90 tablet 3   No facility-administered medications prior to visit.     ROS See HPI  Objective:  BP 134/82   Pulse 82   Temp 98.2 F (36.8 C) (Oral)   Ht 5\' 7"  (1.702 m)   Wt (!) 309 lb 12.8 oz (140.5 kg)   LMP 11/10/2011   SpO2 96%   BMI 48.52 kg/m   BP Readings from Last 3 Encounters:  07/21/18 134/82  07/17/18 138/82  05/05/18 (!) 146/90    Wt Readings from Last 3 Encounters:  07/21/18 (!) 309 lb 12.8 oz (140.5 kg)  07/17/18 (!) 315 lb 3.2 oz (143 kg)  05/05/18 (!) 309 lb (140.2 kg)    Physical Exam Vitals signs reviewed.  Constitutional:      Appearance: She is obese.  Eyes:     Extraocular Movements: Extraocular movements intact.     Conjunctiva/sclera: Conjunctivae normal.     Pupils: Pupils are equal, round, and reactive to light.  Neck:     Musculoskeletal: Normal range of motion and neck supple.  Cardiovascular:     Rate and Rhythm: Normal rate and regular rhythm.  Pulmonary:     Effort: Pulmonary effort is normal.     Breath sounds: Normal breath sounds.  Neurological:     General: No focal deficit present.     Mental Status: She is alert and oriented to person, place, and  time.     Lab Results  Component Value Date   WBC 7.4 07/21/2018   HGB 13.3 07/21/2018   HCT 41.2 07/21/2018   PLT 309.0 07/21/2018   GLUCOSE 101 (H) 07/21/2018   CHOL 193 12/10/2016   TRIG 112 12/10/2016   HDL 47 12/10/2016   LDLDIRECT 148.1 06/16/2012   LDLCALC 124 (H) 12/10/2016   ALT 16 05/05/2018   AST 12 05/05/2018   NA 142 07/21/2018   K 3.7 07/21/2018   CL 104 07/21/2018   CREATININE 0.72 07/21/2018   BUN 12 07/21/2018   CO2 31 07/21/2018   TSH 2.130 05/05/2018   HGBA1C 6.0 05/19/2015    Mm 3d Screen Breast Bilateral  Result Date: 02/13/2018 CLINICAL DATA:  Screening. EXAM: DIGITAL SCREENING BILATERAL MAMMOGRAM WITH TOMO AND CAD COMPARISON:  Previous  exam(s). ACR Breast Density Category a: The breast tissue is almost entirely fatty. FINDINGS: There are no findings suspicious for malignancy. Images were processed with CAD. IMPRESSION: No mammographic evidence of malignancy. A result letter of this screening mammogram will be mailed directly to the patient. RECOMMENDATION: Screening mammogram in one year. (Code:SM-B-01Y) BI-RADS CATEGORY  2: Benign. Electronically Signed   By: Evangeline Dakin M.D.   On: 02/13/2018 08:08    Assessment & Plan:   Tayva was seen today for headache.  Diagnoses and all orders for this visit:  Acute nonintractable headache, unspecified headache type -     CBC w/Diff -     Sedimentation rate -     SUMAtriptan (IMITREX) 25 MG tablet; Every hrs as needed. Do not take more than 100mg  in 24hrs -     ondansetron (ZOFRAN) 4 MG tablet; Take 1 tablet (4 mg total) by mouth every 8 (eight) hours as needed for nausea or vomiting. -     Basic metabolic panel   I am having Bellamy Y. Maneri start on SUMAtriptan and ondansetron. I am also having her maintain her Glucosamine HCl, aspirin, cetirizine, furosemide, potassium chloride SA, levofloxacin, HYDROcodone-homatropine, albuterol, and guaiFENesin-dextromethorphan.  Meds ordered this encounter  Medications  . SUMAtriptan (IMITREX) 25 MG tablet    Sig: Every hrs as needed. Do not take more than 100mg  in 24hrs    Dispense:  9 tablet    Refill:  0    Order Specific Question:   Supervising Provider    Answer:   Lucille Passy [[id number]]  . ondansetron (ZOFRAN) 4 MG tablet    Sig: Take 1 tablet (4 mg total) by mouth every 8 (eight) hours as needed for nausea or vomiting.    Dispense:  20 tablet    Refill:  0    Order Specific Question:   Supervising Provider    Answer:   Lucille Passy [[id number]]    Problem List Items Addressed This Visit    None    Visit Diagnoses    Acute nonintractable headache, unspecified headache type    -  Primary   Relevant Medications    SUMAtriptan (IMITREX) 25 MG tablet   ondansetron (ZOFRAN) 4 MG tablet   Other Relevant Orders   CBC w/Diff (Completed)   Sedimentation rate (Completed)   Basic metabolic panel (Completed)       Follow-up: No follow-ups on file.  Wilfred Lacy, NP

## 2018-07-21 NOTE — Patient Instructions (Addendum)
Normal cbc, bmp and sed rate. Use imitrex as prescribed.  General Headache Without Cause A headache is pain or discomfort that is felt around the head or neck area. There are many causes and types of headaches. In some cases, the cause may not be found. Follow these instructions at home: Watch your condition for any changes. Let your doctor know about them. Take these steps to help with your condition: Managing pain      Take over-the-counter and prescription medicines only as told by your doctor.  Lie down in a dark, quiet room when you have a headache.  If told, put ice on your head and neck area: ? Put ice in a plastic bag. ? Place a towel between your skin and the bag. ? Leave the ice on for 20 minutes, 2-3 times per day.  If told, put heat on the affected area. Use the heat source that your doctor recommends, such as a moist heat pack or a heating pad. ? Place a towel between your skin and the heat source. ? Leave the heat on for 20-30 minutes. ? Remove the heat if your skin turns bright red. This is very important if you are unable to feel pain, heat, or cold. You may have a greater risk of getting burned.  Keep lights dim if bright lights bother you or make your headaches worse. Eating and drinking  Eat meals on a regular schedule.  If you drink alcohol: ? Limit how much you use to:  0-1 drink a day for women.  0-2 drinks a day for men. ? Be aware of how much alcohol is in your drink. In the [place] one drink equals one 12 oz bottle of beer (355 mL), one 5 oz glass of wine (148 mL), or one 1 oz glass of hard liquor (44 mL).  Stop drinking caffeine, or reduce how much caffeine you drink. General instructions   Keep a journal to find out if certain things bring on headaches. For example, write down: ? What you eat and drink. ? How much sleep you get. ? Any change to your diet or medicines.  Get a massage or try other ways to relax.  Limit stress.  Sit up  straight. Do not tighten (tense) your muscles.  Do not use any products that contain nicotine or tobacco. This includes cigarettes, e-cigarettes, and chewing tobacco. If you need help quitting, ask your doctor.  Exercise regularly as told by your doctor.  Get enough sleep. This often means 7-9 hours of sleep each night.  Keep all follow-up visits as told by your doctor. This is important. Contact a doctor if:  Your symptoms are not helped by medicine.  You have a headache that feels different than the other headaches.  You feel sick to your stomach (nauseous) or you throw up (vomit).  You have a fever. Get help right away if:  Your headache gets very bad quickly.  Your headache gets worse after a lot of physical activity.  You keep throwing up.  You have a stiff neck.  You have trouble seeing.  You have trouble speaking.  You have pain in the eye or ear.  Your muscles are weak or you lose muscle control.  You lose your balance or have trouble walking.  You feel like you will pass out (faint) or you pass out.  You are mixed up (confused).  You have a seizure. Summary  A headache is pain or discomfort that is felt around  the head or neck area.  There are many causes and types of headaches. In some cases, the cause may not be found.  Keep a journal to help find out what causes your headaches. Watch your condition for any changes. Let your doctor know about them.  Contact a doctor if you have a headache that is different from usual, or if your headache is not helped by medicine.  Get help right away if your headache gets very bad, you throw up, you have trouble seeing, you lose your balance, or you have a seizure. This information is not intended to replace advice given to you by your health care provider. Make sure you discuss any questions you have with your health care provider. Document Released: 04/13/2008 Document Revised: 01/23/2018 Document Reviewed:  01/23/2018 Elsevier Interactive Patient Education  2019 [employer].

## 2018-07-24 ENCOUNTER — Telehealth: Payer: Self-pay

## 2018-07-24 NOTE — Telephone Encounter (Signed)
34 is not considered abnormal. The only time we are concerned about this, is when the number is 2x the upper limit of normal. No additional testing is necessary at this time.

## 2018-07-24 NOTE — Telephone Encounter (Signed)
Pt verbalized understanding.

## 2018-07-24 NOTE — Telephone Encounter (Signed)
Pt. Is concerned about her sedimentation rate the normal level is 0-30 and her result was 34.  She wants to know if there is anything she should be concerned with.

## 2018-07-24 NOTE — Telephone Encounter (Signed)
Left VM for pt to call back about her sedimentation rate question.

## 2018-12-22 ENCOUNTER — Other Ambulatory Visit: Payer: Self-pay

## 2018-12-22 ENCOUNTER — Telehealth (INDEPENDENT_AMBULATORY_CARE_PROVIDER_SITE_OTHER): Payer: 59 | Admitting: Nurse Practitioner

## 2018-12-22 ENCOUNTER — Encounter: Payer: Self-pay | Admitting: Nurse Practitioner

## 2018-12-22 ENCOUNTER — Other Ambulatory Visit: Payer: Self-pay | Admitting: Hematology

## 2018-12-22 ENCOUNTER — Other Ambulatory Visit: Payer: 59

## 2018-12-22 VITALS — Temp 98.7°F | Ht 67.0 in

## 2018-12-22 DIAGNOSIS — Z20822 Contact with and (suspected) exposure to covid-19: Secondary | ICD-10-CM

## 2018-12-22 DIAGNOSIS — Z20828 Contact with and (suspected) exposure to other viral communicable diseases: Secondary | ICD-10-CM

## 2018-12-22 DIAGNOSIS — R6889 Other general symptoms and signs: Secondary | ICD-10-CM

## 2018-12-22 NOTE — Progress Notes (Signed)
Virtual Visit via Video Note  I connected with Jessica Davenport on 12/22/18 at  9:45 AM EDT by a video enabled telemedicine application and verified that I am speaking with the correct person using two identifiers.  Location: Patient: private office Provider: Office   I discussed the limitations of evaluation and management by telemedicine and the availability of in person appointments. The patient expressed understanding and agreed to proceed.  CC: pt is c/o of coughing,congestions,headache,very tired,wheezing,SOB at times, diarrhea,abd at times and body hurt/2-3 wks/took tylenol,advile and immodium/ FYI--had confirm case at work about 3 wks ago (pt works at [employer]).   History of Present Illness:  URI   This is a new problem. The current episode started 1 to 4 weeks ago. The problem has been waxing and waning. Associated symptoms include chest pain, congestion, coughing, diarrhea, headaches, joint pain, rhinorrhea, sinus pain and wheezing. Pertinent negatives include no abdominal pain, dysuria, ear pain, joint swelling, nausea, neck pain, plugged ear sensation, rash, sneezing, sore throat, swollen glands or vomiting. She has tried acetaminophen, NSAIDs and increased fluids for the symptoms. The treatment provided mild relief.   Onset of diarrhea last night, 2 episodes, liquid to soft. Onset of cough, SOB myalgia, and nasal congestion x 3weeks. Has chills, but no known fever. Has SOB with minimal exertion (walking from bed to bathroom), reports ankle edema (increase furosemide dose to 60mg  daily x 1week with no improvement) reports confirmed case at jobsite 3weeks ago.  Observations/Objective: Physical Exam  Constitutional: She is oriented to person, place, and time.  Pulmonary/Chest: Effort normal. No respiratory distress.  Neurological: She is alert and oriented to person, place, and time.  Psychiatric: She has a normal mood and affect. Her behavior is normal. Thought content  normal.   Assessment and Plan: Jessica Davenport was seen today for cough.  Diagnoses and all orders for this visit:  Flu-like symptoms   Follow Up Instructions: Advised to go to [facility] for additional evaluation and possible COVID test. She agreed to be accompanied by friend/family member. Provided work note.   I discussed the assessment and treatment plan with the patient. The patient was provided an opportunity to ask questions and all were answered. The patient agreed with the plan and demonstrated an understanding of the instructions.   The patient was advised to call back or seek an in-person evaluation if the symptoms worsen or if the condition fails to improve as anticipated.   Wilfred Lacy, NP

## 2018-12-22 NOTE — Progress Notes (Signed)
Referred by Wilfred Lacy at [facility]. / Patient showed up at testing site for screening. /

## 2018-12-25 LAB — NOVEL CORONAVIRUS, NAA: SARS-CoV-2, NAA: NOT DETECTED

## 2019-01-02 ENCOUNTER — Telehealth: Payer: Self-pay | Admitting: Cardiology

## 2019-01-02 ENCOUNTER — Ambulatory Visit: Payer: 59 | Admitting: Nurse Practitioner

## 2019-01-02 ENCOUNTER — Telehealth: Payer: Self-pay | Admitting: *Deleted

## 2019-01-02 NOTE — Telephone Encounter (Signed)
New Message            I scheduled patient with Cecilie Kicks on tomorrow for an in office visit, Mickel Baas is Virtual only tomorrow, patient has some fluid /swelling in her legs and  Some wheezing. Pls call to advise. I will cancel appointment for now.

## 2019-01-02 NOTE — Telephone Encounter (Signed)
Per [name], pt gave verbal permission for a virtual visit with Cecilie Kicks, NP, 01/03/2019.     Virtual Visit Pre-Appointment Phone Call  "(Name), I am calling you today to discuss your upcoming appointment. We are currently trying to limit exposure to the virus that causes COVID-19 by seeing patients at home rather than in the office."  1. "What is the BEST phone number to call the day of the visit?" - include this in appointment notes  2. "Do you have or have access to (through a family member/friend) a smartphone with video capability that we can use for your visit?" a. If yes - list this number in appt notes as "cell" (if different from BEST phone #) and list the appointment type as a VIDEO visit in appointment notes b. If no - list the appointment type as a PHONE visit in appointment notes  3. Confirm consent - "In the setting of the current Covid19 crisis, you are scheduled for a (phone or video) visit with your provider on (date) at (time).  Just as we do with many in-office visits, in order for you to participate in this visit, we must obtain consent.  If you'd like, I can send this to your mychart (if signed up) or email for you to review.  Otherwise, I can obtain your verbal consent now.  All virtual visits are billed to your insurance company just like a normal visit would be.  By agreeing to a virtual visit, we'd like you to understand that the technology does not allow for your provider to perform an examination, and thus may limit your provider's ability to fully assess your condition. If your provider identifies any concerns that need to be evaluated in person, we will make arrangements to do so.  Finally, though the technology is pretty good, we cannot assure that it will always work on either your or our end, and in the setting of a video visit, we may have to convert it to a phone-only visit.  In either situation, we cannot ensure that we have a secure connection.  Are you  willing to proceed?" STAFF: Did the patient verbally acknowledge consent to telehealth visit? Document YES/NO here: YES  4. Advise patient to be prepared - "Two hours prior to your appointment, go ahead and check your blood pressure, pulse, oxygen saturation, and your weight (if you have the equipment to check those) and write them all down. When your visit starts, your provider will ask you for this information. If you have an Apple Watch or Kardia device, please plan to have heart rate information ready on the day of your appointment. Please have a pen and paper handy nearby the day of the visit as well."  5. Give patient instructions for MyChart download to smartphone OR Doximity/Doxy.me as below if video visit (depending on what platform provider is using)  6. Inform patient they will receive a phone call 15 minutes prior to their appointment time (may be from unknown caller ID) so they should be prepared to answer    Jessica Davenport has been deemed a candidate for a follow-up tele-health visit to limit community exposure during the Covid-19 pandemic. I spoke with the patient via phone to ensure availability of phone/video source, confirm preferred email & phone number, and discuss instructions and expectations.  I reminded Jessica Davenport to be prepared with any vital sign and/or heart rhythm information that could potentially be obtained via home monitoring,  at the time of her visit. I reminded Jessica Davenport to expect a phone call prior to her visit.  Jeanann Lewandowsky, [place] 01/02/2019 9:13 AM   INSTRUCTIONS FOR DOWNLOADING THE MYCHART APP TO SMARTPHONE  - The patient must first make sure to have activated MyChart and know their login information - If Apple, go to [employer] and type in MyChart in the search bar and download the app. If Android, ask patient to go to [employer] and type in [place] in the search bar and download the app. The app is free but as  with any other app downloads, their phone may require them to verify saved payment information or Apple/Android password.  - The patient will need to then log into the app with their MyChart username and password, and select [facility] as their healthcare provider to link the account. When it is time for your visit, go to the MyChart app, find appointments, and click Begin Video Visit. Be sure to Select Allow for your device to access the Microphone and Camera for your visit. You will then be connected, and your provider will be with you shortly.  **If they have any issues connecting, or need assistance please contact MyChart service desk [phone])**  **If using a computer, in order to ensure the best quality for their visit they will need to use either of the following Internet Browsers: [employer], or Google Chrome**  IF USING DOXIMITY or DOXY.ME - The patient will receive a link just prior to their visit by text.     FULL LENGTH CONSENT FOR TELE-HEALTH VISIT   I hereby voluntarily request, consent and authorize [facility] and its employed or contracted physicians, physician assistants, nurse practitioners or other licensed health care professionals (the Practitioner), to provide me with telemedicine health care services (the "Services") as deemed necessary by the treating Practitioner. I acknowledge and consent to receive the Services by the Practitioner via telemedicine. I understand that the telemedicine visit will involve communicating with the Practitioner through live audiovisual communication technology and the disclosure of certain medical information by electronic transmission. I acknowledge that I have been given the opportunity to request an in-person assessment or other available alternative prior to the telemedicine visit and am voluntarily participating in the telemedicine visit.  I understand that I have the right to withhold or withdraw my consent to the  use of telemedicine in the course of my care at any time, without affecting my right to future care or treatment, and that the Practitioner or I may terminate the telemedicine visit at any time. I understand that I have the right to inspect all information obtained and/or recorded in the course of the telemedicine visit and may receive copies of available information for a reasonable fee.  I understand that some of the potential risks of receiving the Services via telemedicine include:  Marland Kitchen Delay or interruption in medical evaluation due to technological equipment failure or disruption; . Information transmitted may not be sufficient (e.g. poor resolution of images) to allow for appropriate medical decision making by the Practitioner; and/or  . In rare instances, security protocols could fail, causing a breach of personal health information.  Furthermore, I acknowledge that it is my responsibility to provide information about my medical history, conditions and care that is complete and accurate to the best of my ability. I acknowledge that Practitioner's advice, recommendations, and/or decision may be based on factors not within their control, such as incomplete or inaccurate  data provided by me or distortions of diagnostic images or specimens that may result from electronic transmissions. I understand that the practice of medicine is not an exact science and that Practitioner makes no warranties or guarantees regarding treatment outcomes. I acknowledge that I will receive a copy of this consent concurrently upon execution via email to the email address I last provided but may also request a printed copy by calling the office of [facility].    I understand that my insurance will be billed for this visit.   I have read or had this consent read to me. . I understand the contents of this consent, which adequately explains the benefits and risks of the Services being provided via telemedicine.  . I have  been provided ample opportunity to ask questions regarding this consent and the Services and have had my questions answered to my satisfaction. . I give my informed consent for the services to be provided through the use of telemedicine in my medical care  By participating in this telemedicine visit I agree to the above.

## 2019-01-02 NOTE — Telephone Encounter (Signed)
F/U Message              Patient agreed to do a Virtual visit over the phone, patient would still like a call from the nurse to discuss the swelling/wheezing. Pls advise.

## 2019-01-02 NOTE — Telephone Encounter (Signed)
I spoke to the patient who continues to have swelling in her legs and becomes SOB at times.  She takes Lasix 40 mg Daily and will take an extra this afternoon, if not relieved.  When she takes more than 40 mg of Lasix, she does experience cramping, so she remains cautious.    She will discuss this all tomorrow 6/17 with her Virtual Visit by Video with Mickel Baas.

## 2019-01-03 ENCOUNTER — Ambulatory Visit (INDEPENDENT_AMBULATORY_CARE_PROVIDER_SITE_OTHER): Payer: 59

## 2019-01-03 ENCOUNTER — Other Ambulatory Visit: Payer: Self-pay

## 2019-01-03 ENCOUNTER — Ambulatory Visit (INDEPENDENT_AMBULATORY_CARE_PROVIDER_SITE_OTHER): Payer: 59 | Admitting: Nurse Practitioner

## 2019-01-03 ENCOUNTER — Encounter: Payer: Self-pay | Admitting: Nurse Practitioner

## 2019-01-03 ENCOUNTER — Encounter: Payer: Self-pay | Admitting: Cardiology

## 2019-01-03 ENCOUNTER — Ambulatory Visit: Payer: 59 | Admitting: Cardiology

## 2019-01-03 ENCOUNTER — Telehealth (INDEPENDENT_AMBULATORY_CARE_PROVIDER_SITE_OTHER): Payer: 59 | Admitting: Cardiology

## 2019-01-03 VITALS — Ht 67.0 in | Wt 185.0 lb

## 2019-01-03 VITALS — Temp 100.1°F | Ht 67.0 in

## 2019-01-03 DIAGNOSIS — R05 Cough: Secondary | ICD-10-CM

## 2019-01-03 DIAGNOSIS — J209 Acute bronchitis, unspecified: Secondary | ICD-10-CM | POA: Diagnosis not present

## 2019-01-03 DIAGNOSIS — R0602 Shortness of breath: Secondary | ICD-10-CM | POA: Diagnosis not present

## 2019-01-03 DIAGNOSIS — Q211 Atrial septal defect, unspecified: Secondary | ICD-10-CM

## 2019-01-03 DIAGNOSIS — R609 Edema, unspecified: Secondary | ICD-10-CM | POA: Diagnosis not present

## 2019-01-03 MED ORDER — PREDNISONE 10 MG PO TABS: 20.0000 mg | ORAL_TABLET | Freq: Every day | ORAL | 0 refills | Status: DC

## 2019-01-03 MED ORDER — BENZONATATE 100 MG PO CAPS: 100.0000 mg | ORAL_CAPSULE | Freq: Three times a day (TID) | ORAL | 0 refills | Status: DC | PRN

## 2019-01-03 MED ORDER — ALBUTEROL SULFATE HFA 108 (90 BASE) MCG/ACT IN AERS: 1.0000 | INHALATION_SPRAY | Freq: Four times a day (QID) | RESPIRATORY_TRACT | 0 refills | Status: DC | PRN

## 2019-01-03 MED ORDER — GUAIFENESIN ER 600 MG PO TB12: 600.0000 mg | ORAL_TABLET | Freq: Two times a day (BID) | ORAL | 0 refills | Status: DC | PRN

## 2019-01-03 MED ORDER — FUROSEMIDE 40 MG PO TABS: 40.0000 mg | ORAL_TABLET | Freq: Every day | ORAL | 3 refills | Status: DC

## 2019-01-03 NOTE — Progress Notes (Signed)
Virtual Visit via Video Note   This visit type was conducted due to national recommendations for restrictions regarding the COVID-19 Pandemic (e.g. social distancing) in an effort to limit this patient's exposure and mitigate transmission in our community.  Due to her co-morbid illnesses, this patient is at least at moderate risk for complications without adequate follow up.  This format is felt to be most appropriate for this patient at this time.  All issues noted in this document were discussed and addressed.  A limited physical exam was performed with this format.  Please refer to the patient's chart for her consent to telehealth for [facility].   Date:  01/03/2019   ID:  Jessica Davenport, DOB 1966-06-04, MRN [id number]  Patient Location: Home Provider Location: Office  PCP:  Servando Salina, MD  Cardiologist:  Larae Grooms, MD  Electrophysiologist:  None   Evaluation Performed:  Follow-Up Visit  Chief Complaint:  ASD repair  History of Present Illness:    Jessica Davenport is a 53 y.o. female with hx of ASD repair and lower ext edema. She has a h/oASD repair at age 40.Shehas had longstanding issues with LE edema. She benefits from diuretics, leg elevation and then compression stockings.  Last saw Dr. Beau Fanny  11/2016  Last echo 2016 EF 60 to 65%   On last visit 1 year ago. No chest pain and no SOB.  Her edema had increased, she has stopped torsemide due to leg cramps.  She has been on lasix in the past but it stopped working.  She would like to try again.   She is planning for bariatric surgery in near future, her insurance will pay.  She has fallen off diet at times.     Today she had co workers with [employer] and she has had some fever, cough and SOB.  Her COVID was neg, and she is going for CXR today.  Her PCP is following.  Some chest heaviness with her SOB but only then.  She is planning on bariatric surgery in future.   She is on lasix every day.      The patient does have symptoms concerning for COVID-19 infection but test is neg.  (fever, chills, cough, or new shortness of breath).    Past Medical History:  Diagnosis Date  . Abscess   . Allergy   . Anemia   . Bilateral lower extremity edema 12/02/2016  . Fibroid, uterine   . Infected sebaceous cyst    mons pubis  . Iron deficiency anemia 08/19/2009       . Lumbar disc disease   . Morbid obesity ([place])   . Obesity, Class III, BMI 40-49.9 (morbid obesity) ([place]) 02/15/2012  . 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 Pulmonary HTN ([place]) 08/20/2009       . Pulmonary hypertension ([place])   . Pure hypercholesterolemia 06/16/2012  . Small bowel obstruction ([place]) 2013  . Vitamin D deficiency   . Vitamin D deficiency 08/19/2009   Qualifier: Diagnosis of  By: Jenny Reichmann MD, Hunt Oris    Past Surgical History:  Procedure Laterality Date  . ABDOMINAL HYSTERECTOMY  12/07/11  . ASD REPAIR  2005/2009  . ENDOMETRIAL ABLATION  2010,2011,2012  . TUBAL LIGATION  1996  . uterine ablation       Current Meds  Medication Sig  . aspirin 81 MG tablet Take 81 mg by mouth daily.  Marland Kitchen  cetirizine (ZYRTEC) 10 MG tablet Take 1 tablet (10 mg total) by mouth 2 (two) times daily.  . Cyanocobalamin (B-12 PO) Take by mouth.  . furosemide (LASIX) 40 MG tablet Take 1 tablet (40 mg total) by mouth daily.  . Glucosamine HCl 1000 MG TABS Take by mouth as needed.   . potassium chloride SA (K-DUR,KLOR-CON) 20 MEQ tablet Take 1 tablet (20 mEq total) by mouth daily.     Allergies:   Other   Social History   Tobacco Use  . Smoking status: Never Smoker  . Smokeless tobacco: Never Used  Substance Use Topics  . Alcohol use: No  . Drug use: No     Family Hx: The patient's family history includes Anxiety disorder in her sister; Breast cancer in her maternal aunt and mother; Cervical cancer in her maternal aunt; Colon cancer in her maternal uncle;  Coronary artery disease in her father and mother; Diabetes in her father and mother; Hypertension in her father and mother; Ovarian cancer in her maternal aunt.  ROS:   Please see the history of present illness.    General:no colds or fevers, no weight changes Skin:no rashes or ulcers HEENT:no blurred vision, no congestion CV:see HPI PUL:see HPI GI:no diarrhea constipation or melena, no indigestion GU:no hematuria, no dysuria MS:no joint pain, no claudication Neuro:no syncope, no lightheadedness Endo:no diabetes, no thyroid disease  All other systems reviewed and are negative.   Prior CV studies:   The following studies were reviewed today:  Echo 2016  Echo   Study Conclusions  - Left ventricle: The cavity size was normal. Wall thickness was normal. Systolic function was normal. The estimated ejection fraction was in the range of 60% to 65%. Wall motion was normal; there were no regional wall motion abnormalities  Labs/Other Tests and Data Reviewed:    EKG:  An ECG dated 05/08/18 was personally reviewed today and demonstrated:  SR with non specific T wave abnormality but no changes  Recent Labs: 05/05/2018: ALT 16; Magnesium 2.0; TSH 2.130 07/21/2018: BUN 12; Creatinine, Ser 0.72; Hemoglobin 13.3; Platelets 309.0; Potassium 3.7; Sodium 142   Recent Lipid Panel Lab Results  Component Value Date/Time   CHOL 193 12/10/2016 08:02 AM   TRIG 112 12/10/2016 08:02 AM   HDL 47 12/10/2016 08:02 AM   CHOLHDL 4.1 12/10/2016 08:02 AM   CHOLHDL 4 05/19/2015 02:52 PM   LDLCALC 124 (H) 12/10/2016 08:02 AM   LDLDIRECT 148.1 06/16/2012 09:29 AM    Wt Readings from Last 3 Encounters:  01/03/19 185 lb (83.9 kg)  07/21/18 (!) 309 lb 12.8 oz (140.5 kg)  07/17/18 (!) 315 lb 3.2 oz (143 kg)     Objective:    Vital Signs:  Ht 5\' 7"  (1.702 m)   Wt 185 lb (83.9 kg)   LMP 11/10/2011   BMI 28.98 kg/m    VITAL SIGNS:  reviewed  General female in NAD Neuro A&O X 3 follows  commands Lungs, is dyspneic with talking. No obvious wheezes Psych pleasant affect   ASSESSMENT & PLAN:    1. Hx of ASD repair 2005, doing well mild edema per pt 2. DOE with flu like symptoms and neg COVID her pcp is following.  For CXR today  We will do an echo in 3 weeks then follow up with Dr. Irish Lack  3. Obesity planning for bariatric surgery once flu like illness clears and echo stable she would be cleared for surgery 4. Flu like symptoms.  Per PCP  COVID-19 Education: The signs and symptoms of COVID-19 were discussed with the patient and how to seek care for testing (follow up with PCP or arrange E-visit).  The importance of social distancing was discussed today.  Time:   Today, I have spent 15 minutes with the patient with telehealth technology discussing the above problems.     Medication Adjustments/Labs and Tests Ordered: Current medicines are reviewed at length with the patient today.  Concerns regarding medicines are outlined above.   Tests Ordered: No orders of the defined types were placed in this encounter.   Medication Changes: No orders of the defined types were placed in this encounter.   Follow Up:  Virtual Visit in 4 week(s)  Signed, Cecilie Kicks, NP  01/03/2019 2:38 PM    [place]

## 2019-01-03 NOTE — Progress Notes (Signed)
Virtual Visit via Video Note  I connected with Jessica Davenport on 01/03/19 at  4:00 PM EDT by a video enabled telemedicine application and verified that I am speaking with the correct person using two identifiers.  Location: Patient: in her car Provider: office   I discussed the limitations of evaluation and management by telemedicine and the availability of in person appointments. The patient expressed understanding and agreed to proceed.  CC: follow up from last ov,still has some coughing and wheezing at times--taking dayquil and robitussin at home/ FMLA consult--pt has been out of work since 12/22/2018---need this visit to release back to work.   History of Present Illness: Cough This is a new problem. The current episode started 1 to 4 weeks ago. The problem has been unchanged. The cough is non-productive. Associated symptoms include shortness of breath and wheezing. Pertinent negatives include no chest pain, chills, fever, myalgias, nasal congestion, postnasal drip, rhinorrhea or weight loss. The symptoms are aggravated by lying down. She has tried OTC cough suppressant for the symptoms. The treatment provided mild relief.   Observations/Objective: Physical Exam  Constitutional: She is oriented to person, place, and time.  Pulmonary/Chest: Effort normal.  Neurological: She is alert and oriented to person, place, and time.   Assessment and Plan: Jessica Davenport was seen today for follow-up.  Diagnoses and all orders for this visit:  Acute bronchitis, unspecified organism -     DG Chest 2 View; Future -     DG Chest 2 View -     albuterol (VENTOLIN HFA) 108 (90 Base) MCG/ACT inhaler; Inhale 1-2 puffs into the lungs every 6 (six) hours as needed. -     guaiFENesin (MUCINEX) 600 MG 12 hr tablet; Take 1 tablet (600 mg total) by mouth 2 (two) times daily as needed for cough or to loosen phlegm. -     benzonatate (TESSALON) 100 MG capsule; Take 1 capsule (100 mg total) by mouth 3 (three)  times daily as needed for cough. -     predniSONE (DELTASONE) 10 MG tablet; Take 2 tablets (20 mg total) by mouth daily with breakfast.  SOB (shortness of breath) -     albuterol (VENTOLIN HFA) 108 (90 Base) MCG/ACT inhaler; Inhale 1-2 puffs into the lungs every 6 (six) hours as needed. -     guaiFENesin (MUCINEX) 600 MG 12 hr tablet; Take 1 tablet (600 mg total) by mouth 2 (two) times daily as needed for cough or to loosen phlegm. -     benzonatate (TESSALON) 100 MG capsule; Take 1 capsule (100 mg total) by mouth 3 (three) times daily as needed for cough. -     predniSONE (DELTASONE) 10 MG tablet; Take 2 tablets (20 mg total) by mouth daily with breakfast.   Follow Up Instructions: Normal CXR. Oral prednisone and benzonatate sent for acute bronchitis.   I discussed the assessment and treatment plan with the patient. The patient was provided an opportunity to ask questions and all were answered. The patient agreed with the plan and demonstrated an understanding of the instructions.   The patient was advised to call back or seek an in-person evaluation if the symptoms worsen or if the condition fails to improve as anticipated.   Wilfred Lacy, NP

## 2019-01-03 NOTE — Patient Instructions (Signed)
Medication Instructions:  Your physician recommends that you continue on your current medications as directed. Please refer to the Current Medication list given to you today.  If you need a refill on your cardiac medications before your next appointment, please call your pharmacy.   Lab work: None ordered  If you have labs (blood work) drawn today and your tests are completely normal, you will receive your results only by: Marland Kitchen MyChart Message (if you have MyChart) OR . A paper copy in the mail If you have any lab test that is abnormal or we need to change your treatment, we will call you to review the results.  Testing/Procedures: Your physician has requested that you have an echocardiogram in 3 weeks.. Echocardiography is a painless test that uses sound waves to create images of your heart. It provides your doctor with information about the size and shape of your heart and how well your heart's chambers and valves are working. This procedure takes approximately one hour. There are no restrictions for this procedure.    Follow-Up: At [facility], you and your health needs are our priority.  As part of our continuing mission to provide you with exceptional heart care, we have created designated Provider Care Teams.  These Care Teams include your primary Cardiologist (physician) and Advanced Practice Providers (APPs -  Physician Assistants and Nurse Practitioners) who all work together to provide you with the care you need, when you need it. You will need a follow up appointment in 4 weeks with Dr. Irish Lack or Cecilie Kicks, NP.  Someone will call you with this appointment,.   Any Other Special Instructions Will Be Listed Below (If Applicable).  Echocardiogram An echocardiogram is a procedure that uses painless sound waves (ultrasound) to produce an image of the heart. Images from an echocardiogram can provide important information about:  Signs of coronary artery disease (CAD).  Aneurysm  detection. An aneurysm is a weak or damaged part of an artery wall that bulges out from the normal force of blood pumping through the body.  Heart size and shape. Changes in the size or shape of the heart can be associated with certain conditions, including heart failure, aneurysm, and CAD.  Heart muscle function.  Heart valve function.  Signs of a past heart attack.  Fluid buildup around the heart.  Thickening of the heart muscle.  A tumor or infectious growth around the heart valves. Tell a health care provider about:  Any allergies you have.  All medicines you are taking, including vitamins, herbs, eye drops, creams, and over-the-counter medicines.  Any blood disorders you have.  Any surgeries you have had.  Any medical conditions you have.  Whether you are pregnant or may be pregnant. What are the risks? Generally, this is a safe procedure. However, problems may occur, including:  Allergic reaction to dye (contrast) that may be used during the procedure. What happens before the procedure? No specific preparation is needed. You may eat and drink normally. What happens during the procedure?   An IV tube may be inserted into one of your veins.  You may receive contrast through this tube. A contrast is an injection that improves the quality of the pictures from your heart.  A gel will be applied to your chest.  A wand-like tool (transducer) will be moved over your chest. The gel will help to transmit the sound waves from the transducer.  The sound waves will harmlessly bounce off of your heart to allow the heart  images to be captured in real-time motion. The images will be recorded on a computer. The procedure may vary among health care providers and hospitals. What happens after the procedure?  You may return to your normal, everyday life, including diet, activities, and medicines, unless your health care provider tells you not to do that. Summary  An  echocardiogram is a procedure that uses painless sound waves (ultrasound) to produce an image of the heart.  Images from an echocardiogram can provide important information about the size and shape of your heart, heart muscle function, heart valve function, and fluid buildup around your heart.  You do not need to do anything to prepare before this procedure. You may eat and drink normally.  After the echocardiogram is completed, you may return to your normal, everyday life, unless your health care provider tells you not to do that. This information is not intended to replace advice given to you by your health care provider. Make sure you discuss any questions you have with your health care provider. Document Released: 07/02/2000 Document Revised: 08/07/2016 Document Reviewed: 08/07/2016 Elsevier Interactive Patient Education  2019 [employer].

## 2019-01-04 ENCOUNTER — Other Ambulatory Visit: Payer: Self-pay | Admitting: Interventional Cardiology

## 2019-01-04 MED ORDER — FUROSEMIDE 40 MG PO TABS: 40.0000 mg | ORAL_TABLET | Freq: Every day | ORAL | 3 refills | Status: DC

## 2019-01-04 NOTE — Addendum Note (Signed)
Addended by: Derl Barrow on: 01/04/2019 02:37 PM   Modules accepted: Orders

## 2019-01-04 NOTE — Telephone Encounter (Signed)
 *  STAT* If patient is at the pharmacy, call can be transferred to refill team.   1. Which medications need to be refilled? (please list name of each medication and dose if known) furosemide (LASIX) 40 MG tablet  2. Which pharmacy/location (including street and city if local pharmacy) is medication to be sent to? [facility] [place]  3. Do they need a 30 day or 90 day supply? 90 days  Patient states that [facility] is refusing to refill her script for 90 days so please send to [facility]

## 2019-01-04 NOTE — Telephone Encounter (Signed)
Pt's medication was sent to pt's pharmacy as requested. Confirmation received.  °

## 2019-01-08 ENCOUNTER — Ambulatory Visit: Payer: Self-pay

## 2019-01-08 DIAGNOSIS — I10 Essential (primary) hypertension: Secondary | ICD-10-CM | POA: Insufficient documentation

## 2019-01-08 MED ORDER — LISINOPRIL 10 MG PO TABS: 10.0000 mg | ORAL_TABLET | Freq: Every day | ORAL | 2 refills | Status: DC

## 2019-01-08 NOTE — Telephone Encounter (Signed)
Charlotte please advise 

## 2019-01-08 NOTE — Telephone Encounter (Signed)
Patient called and says she just wants to let Baldo Ash know about her blood pressure being 166/88 this morning and now 155/88. She says Baldo Ash told her to buy a wrist BP cuff and that's what she is using. She says she has a headache as well. I advised she may need a virtual visit, she says to let Baldo Ash know of the BP readings and if she needs an appointment to call her back on her cell.   Answer Assessment - Initial Assessment Questions 1. BLOOD PRESSURE: "What is the blood pressure?" "Did you take at least two measurements 5 minutes apart?"     166/88 2. ONSET: "When did you take your blood pressure?"     This morning before leaving home 3. HOW: "How did you obtain the blood pressure?" (e.g., visiting nurse, automatic home BP monitor)     Home wrist cuff 4. HISTORY: "Do you have a history of high blood pressure?"     No 5. MEDICATIONS: "Are you taking any medications for blood pressure?" "Have you missed any doses recently?"     No 6. OTHER SYMPTOMS: "Do you have any symptoms?" (e.g., headache, chest pain, blurred vision, difficulty breathing, weakness)     Headache 7. PREGNANCY: "Is there any chance you are pregnant?" "When was your last menstrual period?"     No  Protocols used: HIGH BLOOD PRESSURE-A-AH

## 2019-01-08 NOTE — Telephone Encounter (Signed)
Pt is aware.  

## 2019-01-08 NOTE — Telephone Encounter (Signed)
BP elevated. Start lisinopril. New rx sent. Schedule f/up video appt in 2weeks. Continue BP check once a day and record. Call office if BP>150/90 after use of medication x 1week

## 2019-01-10 ENCOUNTER — Telehealth: Payer: Self-pay | Admitting: *Deleted

## 2019-01-10 ENCOUNTER — Telehealth: Payer: Self-pay | Admitting: Interventional Cardiology

## 2019-01-10 NOTE — Telephone Encounter (Signed)
Patient wants to know truly what to do to keep herself safe from [place]. She has had another confirmed case of COVID at her workplace. She just does not feel safe at her workplace. She would like the release to work from home indefinitely.

## 2019-01-10 NOTE — Telephone Encounter (Signed)
Pt called Jessica Davenport at [facility] mistakenly for her cardiologist. She is stating that there is another positive case where she works in [place]. She has chronic health issues. She denies symptons at this time. But she is requesting a note from her provider (cardology) to work from home. She will call back for any other concerns.

## 2019-01-10 NOTE — Telephone Encounter (Signed)
Patient calling and states that there was another COVID positive case in the building that she works in. She works for [employer] and states that they have 7 cases total so far. She states that she does not feel like they are cleaning appropriately and she has a significant amount of anxiety regarding catching the virus. She states that she has been wearing masks while at work, frequent handwashing, and practicing 6 ft social distancing. Patient is requesting a letter stating that she needs to work remotely from home. Will forward for review.

## 2019-01-11 NOTE — Telephone Encounter (Signed)
Given her obesity, she would be at higher risk of severe illness if she got COVID 19.  THerefore, would have her work from home for 3 weeks, given multiple cases at her work.  Hopefully, cases in her office will clear up by that time. Continue to practice social distancing and hand washing.

## 2019-01-16 NOTE — Telephone Encounter (Signed)
Letter written and left up front for patient to pick up. Patient aware.

## 2019-01-17 NOTE — Telephone Encounter (Signed)
Follow up   Patient states that she can't work home because there is not enough equipment. Patient states that she will need to be written out for the next 3 weeks. Please call to discuss.

## 2019-01-18 NOTE — Telephone Encounter (Signed)
I think the letter already written should be enough.  Employer should understand that we are recommending that she be out of the office, even if they cannot allow her to work from home.

## 2019-01-22 ENCOUNTER — Encounter: Payer: Self-pay | Admitting: Nurse Practitioner

## 2019-01-22 ENCOUNTER — Other Ambulatory Visit: Payer: Self-pay

## 2019-01-22 ENCOUNTER — Ambulatory Visit (INDEPENDENT_AMBULATORY_CARE_PROVIDER_SITE_OTHER): Payer: 59 | Admitting: Nurse Practitioner

## 2019-01-22 ENCOUNTER — Ambulatory Visit (HOSPITAL_COMMUNITY): Payer: 59 | Attending: Internal Medicine

## 2019-01-22 VITALS — BP 128/70 | HR 64 | Ht 67.0 in

## 2019-01-22 DIAGNOSIS — I10 Essential (primary) hypertension: Secondary | ICD-10-CM | POA: Diagnosis not present

## 2019-01-22 DIAGNOSIS — R0602 Shortness of breath: Secondary | ICD-10-CM | POA: Insufficient documentation

## 2019-01-22 DIAGNOSIS — R609 Edema, unspecified: Secondary | ICD-10-CM

## 2019-01-22 NOTE — Patient Instructions (Addendum)
Continue current medication. Take lisinopril with supper since it causes drowsiness. maintain DASh diet Check BP once a day in morning Send BP readings via mychart on Friday. F/up in office in 45month.(F2F)

## 2019-01-22 NOTE — Progress Notes (Signed)
Virtual Visit via Video Note  I connected with Jessica Davenport on 01/22/19 at  2:00 PM EDT by a video enabled telemedicine application and verified that I am speaking with the correct person using two identifiers.  Location: Patient: In her Car Provider: Office   I discussed the limitations of evaluation and management by telemedicine and the availability of in person appointments. The patient expressed understanding and agreed to proceed.  CC: HTN f/up  History of Present Illness: HTN: Improved with lisinopril. No cough and signs of angioedema. Reports drowsiness when medication taken in AM. Elevated BP when at work (150s-160s/80s) due to increased stress environment. Concerned about coming in contact with COVID virus. Reports 7 confirmed cases at her office. Out of work note provided by cardiology for next 3weeks. BP Readings from Last 3 Encounters:  01/22/19 128/70  07/21/18 134/82  07/17/18 138/82   Observations/Objective: Physical Exam  Constitutional: She is oriented to person, place, and time.  Cardiovascular: Normal rate.  Pulmonary/Chest: Effort normal.  Neurological: She is alert and oriented to person, place, and time.  Vitals reviewed.  Assessment and Plan: Jessica Davenport was seen today for follow-up.  Diagnoses and all orders for this visit:  Essential hypertension   Follow Up Instructions: Continue current medication. Take lisinopril with supper since it causes drowsiness. maintain DASh diet Check BP once a day in morning Send BP readings via mychart on Friday.   I discussed the assessment and treatment plan with the patient. The patient was provided an opportunity to ask questions and all were answered. The patient agreed with the plan and demonstrated an understanding of the instructions.   The patient was advised to call back or seek an in-person evaluation if the symptoms worsen or if the condition fails to improve as anticipated.Wilfred Lacy,  NP

## 2019-01-22 NOTE — Telephone Encounter (Signed)
Called and made patient aware. 

## 2019-01-25 ENCOUNTER — Telehealth: Payer: Self-pay | Admitting: Nurse Practitioner

## 2019-01-25 ENCOUNTER — Telehealth: Payer: Self-pay | Admitting: *Deleted

## 2019-01-25 DIAGNOSIS — I10 Essential (primary) hypertension: Secondary | ICD-10-CM

## 2019-01-25 MED ORDER — OLMESARTAN MEDOXOMIL 20 MG PO TABS: 20.0000 mg | ORAL_TABLET | Freq: Every day | ORAL | 2 refills | Status: DC

## 2019-01-25 NOTE — Telephone Encounter (Signed)
Stop lisinopril. Start olmesartan if lisinopril has not been taken today. Take benadryl 25mg  BID x 2days, then as needed. Do not drive while using benadryl, it if causes drowsiness. Let me know if no complete resolution in 24hrs.

## 2019-01-25 NOTE — Telephone Encounter (Signed)
F/U Message           Patient is retrurning Jennifer's W call would like a call back

## 2019-01-25 NOTE — Telephone Encounter (Signed)
Returned pts call and she has been made aware of her echo results.

## 2019-01-25 NOTE — Telephone Encounter (Signed)
Pt verbalized understand.  

## 2019-01-25 NOTE — Telephone Encounter (Signed)
Call placed to pt re: Echocardiogram results.  Left a message for her to call back.

## 2019-01-25 NOTE — Telephone Encounter (Signed)
Received message from after hour nurse traige stating pt call report of hives on right side mouth/tongue,swelling. Pt wasn't sure if this could come from new rx (lisinopril) that she started 2 wks ago.   Spoke with the pt, she report that the swelling on her tongue/face is down/better than last night after she took benadryle. Pt stated she gets hives at times but this time she is unsure if it is because of lisinopril (she stop this med). Pt denied SOB or other symptoms. Pt was wondering what Baldo Ash thinks, please advise.

## 2019-01-29 ENCOUNTER — Telehealth: Payer: Self-pay | Admitting: Nurse Practitioner

## 2019-01-29 ENCOUNTER — Other Ambulatory Visit: Payer: Self-pay | Admitting: *Deleted

## 2019-01-29 ENCOUNTER — Encounter: Payer: Self-pay | Admitting: Nurse Practitioner

## 2019-01-29 DIAGNOSIS — R609 Edema, unspecified: Secondary | ICD-10-CM

## 2019-01-29 DIAGNOSIS — R0602 Shortness of breath: Secondary | ICD-10-CM

## 2019-01-29 NOTE — Telephone Encounter (Signed)
Until we get another provider, I am not taking patients from other [place] clinics. Please stay with [facility] for now. Check back early next year.

## 2019-01-29 NOTE — Telephone Encounter (Signed)
Patient is requesting transfer from [place] to [facility] due to this location being a better location for her. Please advise.

## 2019-01-29 NOTE — Addendum Note (Signed)
Addended by: Gaetano Net on: 01/29/2019 12:48 PM   Modules accepted: Orders

## 2019-02-01 ENCOUNTER — Telehealth: Payer: Self-pay | Admitting: Internal Medicine

## 2019-02-01 NOTE — Telephone Encounter (Signed)
Yes, I will see her  TJ

## 2019-02-01 NOTE — Telephone Encounter (Signed)
Patient use to be a patient of [facility].  Ronnald Ramp will you be able to take patient back on?

## 2019-02-01 NOTE — Telephone Encounter (Signed)
Pt scheduled  

## 2019-02-01 NOTE — Telephone Encounter (Signed)
error 

## 2019-02-02 ENCOUNTER — Telehealth: Payer: Self-pay | Admitting: Interventional Cardiology

## 2019-02-02 NOTE — Telephone Encounter (Signed)
FMLA form placed in box for Dr. Irish Lack to review. 02/02/19 vlm

## 2019-02-02 NOTE — Telephone Encounter (Signed)

## 2019-02-02 NOTE — Progress Notes (Signed)
Cardiology Office Note   Date:  02/05/2019   ID:  Jessica Davenport, DOB 21-Dec-1965, MRN [id number]  PCP:  Janith Lima, MD    No chief complaint on file.  ASD   Wt Readings from Last 3 Encounters:  02/05/19 (!) 309 lb 12.8 oz (140.5 kg)  01/03/19 185 lb (83.9 kg)  07/21/18 (!) 309 lb 12.8 oz (140.5 kg)       History of Present Illness: Jessica Davenport is a 52 y.o. female   with hx of ASD repair and lower ext edema. She has ah/oASD repair at age 26.Shehas had longstanding issues with LE edema. She benefits from diuretics, leg elevation and then compression stockings.  Had some COVID concerns due to co-workers in June 2020.  Had a negative chest xray.    June 2020 Echo showed:   The left ventricle has normal systolic function with an ejection fraction of 60-65%. The cavity size was normal. Left ventricular diastolic parameters were normal.  2. The right ventricle has normal systolic function. The cavity was normal. There is no increase in right ventricular wall thickness. Right ventricular systolic pressure could not be assessed.  3. The aortic valve is tricuspid.  4. The inferior vena cava was dilated in size with <50% respiratory variability.  5. Cannot exclude small left to right shunt at the inferior aspect of the Amplatzer ASD closure device (clip 83, frame 24). Atrial septum hypermobile, but no dehiscence of device is seen.  6. When compared to the prior study: 07/23/2014 - Limited side by side comparison able to be performed due to software limitations. Device compared side by side in subcostal view. Prior exam likely has similar location left to right shunt at inferior  aspect of ASD closure device, tiny amount of flow and largely unchanged. Atrial septum is hypermobile on both exams. Consider TEE or retrospective ECG gated CTA heart to evaluate further if clinically indicated.  She was supposed to have a gastric sleeve, but this is on hold due to [place].   He had hives with lisinopril.  Tolerating olmesartan.    Past Medical History:  Diagnosis Date  . Abscess   . Allergy   . Anemia   . Bilateral lower extremity edema 12/02/2016  . Fibroid, uterine   . Infected sebaceous cyst    mons pubis  . Iron deficiency anemia 08/19/2009       . Lumbar disc disease   . Morbid obesity ([place])   . Obesity, Class III, BMI 40-49.9 (morbid obesity) ([place]) 02/15/2012  . 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 Pulmonary HTN ([place]) 08/20/2009       . Pulmonary hypertension ([place])   . Pure hypercholesterolemia 06/16/2012  . Small bowel obstruction ([place]) 2013  . Vitamin D deficiency   . Vitamin D deficiency 08/19/2009   Qualifier: Diagnosis of  By: Jenny Reichmann MD, Hunt Oris     Past Surgical History:  Procedure Laterality Date  . ABDOMINAL HYSTERECTOMY  12/07/11  . ASD REPAIR  2005/2009  . ENDOMETRIAL ABLATION  2010,2011,2012  . TUBAL LIGATION  1996  . uterine ablation       Current Outpatient Medications  Medication Sig Dispense Refill  . albuterol (VENTOLIN HFA) 108 (90 Base) MCG/ACT inhaler Inhale 1-2 puffs into the lungs every 6 (six) hours as needed. 1 Inhaler 0  . aspirin 81 MG tablet Take 81 mg by mouth daily.    Marland Kitchen  furosemide (LASIX) 40 MG tablet Take 1 tablet (40 mg total) by mouth daily. 90 tablet 3  . olmesartan (BENICAR) 20 MG tablet Take 20 mg by mouth daily.    . potassium chloride SA (K-DUR,KLOR-CON) 20 MEQ tablet Take 1 tablet (20 mEq total) by mouth daily. 90 tablet 3   No current facility-administered medications for this visit.     Allergies:   Lisinopril and Other    Social History:  The patient  reports that she has never smoked. She has never used smokeless tobacco. She reports that she does not drink alcohol or use drugs.   Family History:  The patient's *family history includes Anxiety disorder in her sister; Breast cancer in her maternal aunt and mother;  Cervical cancer in her maternal aunt; Colon cancer in her maternal uncle; Coronary artery disease in her father and mother; Diabetes in her father and mother; Hypertension in her father and mother; Ovarian cancer in her maternal aunt.    ROS:  Please see the history of present illness.   Otherwise, review of systems are positive for occasional high BP.   All other systems are reviewed and negative.    PHYSICAL EXAM: VS:  BP 128/90   Pulse 79   Ht 5\' 7"  (1.702 m)   Wt (!) 309 lb 12.8 oz (140.5 kg)   LMP 11/10/2011   SpO2 96%   BMI 48.52 kg/m  , BMI Body mass index is 48.52 kg/m. GEN: Well nourished, well developed, in no acute distress  HEENT: normal  Neck: no JVD, carotid bruits, or masses Cardiac: RRR; no murmurs, rubs, or gallops,no edema  Respiratory:  clear to auscultation bilaterally, normal work of breathing GI: soft, nontender, nondistended, + BS MS: no deformity or atrophy  Skin: warm and dry, no rash Neuro:  Strength and sensation are intact Psych: euthymic mood, full affect   EKG:   The ekg ordered today demonstrates NSR, rSR' pattern, no ST changes   Recent Labs: 05/05/2018: ALT 16; Magnesium 2.0; TSH 2.130 07/21/2018: BUN 12; Creatinine, Ser 0.72; Hemoglobin 13.3; Platelets 309.0; Potassium 3.7; Sodium 142   Lipid Panel    Component Value Date/Time   CHOL 193 12/10/2016 0802   TRIG 112 12/10/2016 0802   HDL 47 12/10/2016 0802   CHOLHDL 4.1 12/10/2016 0802   CHOLHDL 4 05/19/2015 1452   VLDL 21.6 05/19/2015 1452   LDLCALC 124 (H) 12/10/2016 0802   LDLDIRECT 148.1 06/16/2012 0929     Other studies Reviewed: Additional studies/ records that were reviewed today with results demonstrating: echo reviewed.   ASSESSMENT AND PLAN:  1. H/O ASD: Small amount of flow across ASD repair.  Not likely significant. No sx of CHF.   2. DOE: Normal RV/RA size in 12/2018.  Increase exercise to build stamina.  More related to deconditioning and obesity rather than CHF.  3.  HTN: Borderline diastolic. Increase exercise to help BP.  COntinue olmesartan for now.   4. Obesity:Still planning to have gastric sleeve surgery, when COVID-19 allows.  Stressed more plant based diet to help with weigh tloss.  5. Return to work on 02/13/2019.  First day off was 01/23/2019.  COncerns were due to COVID infection at work.   Current medicines are reviewed at length with the patient today.  The patient concerns regarding her medicines were addressed.  The following changes have been made:  No change  Labs/ tests ordered today include:  No orders of the defined types were placed in this encounter.  Recommend 150 minutes/week of aerobic exercise Low fat, low carb, high fiber diet recommended  Disposition:   FU in 1 year with echo   Signed, Larae Grooms, MD  02/05/2019 9:45 AM    [facility] [address], [place], [place]  [address] Phone: [phone]; Fax: [phone]

## 2019-02-05 ENCOUNTER — Other Ambulatory Visit: Payer: Self-pay

## 2019-02-05 ENCOUNTER — Encounter: Payer: Self-pay | Admitting: Interventional Cardiology

## 2019-02-05 ENCOUNTER — Ambulatory Visit: Payer: 59 | Admitting: Interventional Cardiology

## 2019-02-05 ENCOUNTER — Telehealth: Payer: Self-pay | Admitting: Interventional Cardiology

## 2019-02-05 VITALS — BP 128/90 | HR 79 | Ht 67.0 in | Wt 309.8 lb

## 2019-02-05 DIAGNOSIS — Q211 Atrial septal defect, unspecified: Secondary | ICD-10-CM

## 2019-02-05 DIAGNOSIS — R0602 Shortness of breath: Secondary | ICD-10-CM | POA: Diagnosis not present

## 2019-02-05 DIAGNOSIS — I10 Essential (primary) hypertension: Secondary | ICD-10-CM

## 2019-02-05 NOTE — Patient Instructions (Addendum)
Medication Instructions:   Your physician recommends that you continue on your current medications as directed. Please refer to the Current Medication list given to you today.  If you need a refill on your cardiac medications before your next appointment, please call your pharmacy.   Lab work:  None ordered today  If you have labs (blood work) drawn today and your tests are completely normal, you will receive your results only by: Marland Kitchen MyChart Message (if you have MyChart) OR . A paper copy in the mail If you have any lab test that is abnormal or we need to change your treatment, we will call you to review the results.  Testing/Procedures:  None ordered today  Follow-Up: At [facility], you and your health needs are our priority.  As part of our continuing mission to provide you with exceptional heart care, we have created designated Provider Care Teams.  These Care Teams include your primary Cardiologist (physician) and Advanced Practice Providers (APPs -  Physician Assistants and Nurse Practitioners) who all work together to provide you with the care you need, when you need it. You will need a follow up appointment in 12 months.  Please call our office 2 months in advance to schedule this appointment.  You may see Larae Grooms, MD or one of the following Advanced Practice Providers on your designated Care Team:   [place], PA-C Melina Copa, PA-C . Ermalinda Barrios, PA-C     DASH Eating Plan DASH stands for "Dietary Approaches to Stop Hypertension." The DASH eating plan is a healthy eating plan that has been shown to reduce high blood pressure (hypertension). It may also reduce your risk for type 2 diabetes, heart disease, and stroke. The DASH eating plan may also help with weight loss. What are tips for following this plan?  General guidelines  Avoid eating more than 2,300 mg (milligrams) of salt (sodium) a day. If you have hypertension, you may need to reduce your  sodium intake to 1,500 mg a day.  Limit alcohol intake to no more than 1 drink a day for nonpregnant women and 2 drinks a day for men. One drink equals 12 oz of beer, 5 oz of wine, or 1 oz of hard liquor.  Work with your health care provider to maintain a healthy body weight or to lose weight. Ask what an ideal weight is for you.  Get at least 30 minutes of exercise that causes your heart to beat faster (aerobic exercise) most days of the week. Activities may include walking, swimming, or biking.  Work with your health care provider or diet and nutrition specialist (dietitian) to adjust your eating plan to your individual calorie needs. Reading food labels   Check food labels for the amount of sodium per serving. Choose foods with less than 5 percent of the Daily Value of sodium. Generally, foods with less than 300 mg of sodium per serving fit into this eating plan.  To find whole grains, look for the word "whole" as the first word in the ingredient list. Shopping  Buy products labeled as "low-sodium" or "no salt added."  Buy fresh foods. Avoid canned foods and premade or frozen meals. Cooking  Avoid adding salt when cooking. Use salt-free seasonings or herbs instead of table salt or sea salt. Check with your health care provider or pharmacist before using salt substitutes.  Do not fry foods. Cook foods using healthy methods such as baking, boiling, grilling, and broiling instead.  Cook with heart-healthy oils,  such as olive, canola, soybean, or sunflower oil. Meal planning  Eat a balanced diet that includes: ? 5 or more servings of fruits and vegetables each day. At each meal, try to fill half of your plate with fruits and vegetables. ? Up to 6-8 servings of whole grains each day. ? Less than 6 oz of lean meat, poultry, or fish each day. A 3-oz serving of meat is about the same size as a deck of cards. One egg equals 1 oz. ? 2 servings of low-fat dairy each day. ? A serving of  nuts, seeds, or beans 5 times each week. ? Heart-healthy fats. Healthy fats called Omega-3 fatty acids are found in foods such as flaxseeds and coldwater fish, like sardines, salmon, and mackerel.  Limit how much you eat of the following: ? Canned or prepackaged foods. ? Food that is high in trans fat, such as fried foods. ? Food that is high in saturated fat, such as fatty meat. ? Sweets, desserts, sugary drinks, and other foods with added sugar. ? Full-fat dairy products.  Do not salt foods before eating.  Try to eat at least 2 vegetarian meals each week.  Eat more home-cooked food and less restaurant, buffet, and fast food.  When eating at a restaurant, ask that your food be prepared with less salt or no salt, if possible. What foods are recommended? The items listed may not be a complete list. Talk with your dietitian about what dietary choices are best for you. Grains Whole-grain or whole-wheat bread. Whole-grain or whole-wheat pasta. Brown rice. [place]. Bulgur. Whole-grain and low-sodium cereals. Pita bread. Low-fat, low-sodium crackers. Whole-wheat flour tortillas. Vegetables Fresh or frozen vegetables (raw, steamed, roasted, or grilled). Low-sodium or reduced-sodium tomato and vegetable juice. Low-sodium or reduced-sodium tomato sauce and tomato paste. Low-sodium or reduced-sodium canned vegetables. Fruits All fresh, dried, or frozen fruit. Canned fruit in natural juice (without added sugar). Meat and other protein foods Skinless chicken or [place]. Ground chicken or [place]. Pork with fat trimmed off. Fish and seafood. Egg whites. Dried beans, peas, or lentils. Unsalted nuts, nut butters, and seeds. Unsalted canned beans. Lean cuts of beef with fat trimmed off. Low-sodium, lean deli meat. Dairy Low-fat (1%) or fat-free (skim) milk. Fat-free, low-fat, or reduced-fat cheeses. Nonfat, low-sodium ricotta or cottage cheese. Low-fat or nonfat yogurt. Low-fat, low-sodium  cheese. Fats and oils Soft margarine without trans fats. Vegetable oil. Low-fat, reduced-fat, or light mayonnaise and salad dressings (reduced-sodium). Canola, safflower, olive, soybean, and sunflower oils. Avocado. Seasoning and other foods Herbs. Spices. Seasoning mixes without salt. Unsalted popcorn and pretzels. Fat-free sweets. What foods are not recommended? The items listed may not be a complete list. Talk with your dietitian about what dietary choices are best for you. Grains Baked goods made with fat, such as croissants, muffins, or some breads. Dry pasta or rice meal packs. Vegetables Creamed or fried vegetables. Vegetables in a cheese sauce. Regular canned vegetables (not low-sodium or reduced-sodium). Regular canned tomato sauce and paste (not low-sodium or reduced-sodium). Regular tomato and vegetable juice (not low-sodium or reduced-sodium). Angie Fava. Olives. Fruits Canned fruit in a light or heavy syrup. Fried fruit. Fruit in cream or butter sauce. Meat and other protein foods Fatty cuts of meat. Ribs. Fried meat. Berniece Salines. Sausage. Bologna and other processed lunch meats. Salami. Fatback. Hotdogs. Bratwurst. Salted nuts and seeds. Canned beans with added salt. Canned or smoked fish. Whole eggs or egg yolks. Chicken or [place] with skin. Dairy Whole or 2% milk, cream,  and half-and-half. Whole or full-fat cream cheese. Whole-fat or sweetened yogurt. Full-fat cheese. Nondairy creamers. Whipped toppings. Processed cheese and cheese spreads. Fats and oils Butter. Stick margarine. Lard. Shortening. Ghee. Bacon fat. Tropical oils, such as coconut, palm kernel, or palm oil. Seasoning and other foods Salted popcorn and pretzels. Onion salt, garlic salt, seasoned salt, table salt, and sea salt. Worcestershire sauce. Tartar sauce. Barbecue sauce. Teriyaki sauce. Soy sauce, including reduced-sodium. Steak sauce. Canned and packaged gravies. Fish sauce. Oyster sauce. Cocktail sauce. Horseradish  that you find on the shelf. Ketchup. Mustard. Meat flavorings and tenderizers. Bouillon cubes. Hot sauce and Tabasco sauce. Premade or packaged marinades. Premade or packaged taco seasonings. Relishes. Regular salad dressings. Where to find more information:  National Heart, Lung, and [place]: https://wilson-eaton.com/  American Heart Association: www.heart.org Summary  The DASH eating plan is a healthy eating plan that has been shown to reduce high blood pressure (hypertension). It may also reduce your risk for type 2 diabetes, heart disease, and stroke.  With the DASH eating plan, you should limit salt (sodium) intake to 2,300 mg a day. If you have hypertension, you may need to reduce your sodium intake to 1,500 mg a day.  When on the DASH eating plan, aim to eat more fresh fruits and vegetables, whole grains, lean proteins, low-fat dairy, and heart-healthy fats.  Work with your health care provider or diet and nutrition specialist (dietitian) to adjust your eating plan to your individual calorie needs. This information is not intended to replace advice given to you by your health care provider. Make sure you discuss any questions you have with your health care provider. Document Released: 06/24/2011 Document Revised: 06/17/2017 Document Reviewed: 06/28/2016 Elsevier Patient Education  2020 [employer].    Why follow it? Research shows. . Those who follow the Mediterranean diet have a reduced risk of heart disease  . The diet is associated with a reduced incidence of Parkinson's and Alzheimer's diseases . People following the diet may have longer life expectancies and lower rates of chronic diseases  . The Dietary Guidelines for Americans recommends the Mediterranean diet as an eating plan to promote health and prevent disease  What Is the Mediterranean Diet?  . Healthy eating plan based on typical foods and recipes of Mediterranean-style cooking . The diet is primarily a plant based  diet; these foods should make up a majority of meals   Starches - Plant based foods should make up a majority of meals - They are an important sources of vitamins, minerals, energy, antioxidants, and fiber - Choose whole grains, foods high in fiber and minimally processed items  - Typical grain sources include wheat, oats, barley, corn, brown rice, bulgar, farro, millet, polenta, couscous  - Various types of beans include chickpeas, lentils, fava beans, black beans, white beans   Fruits  Veggies - Large quantities of antioxidant rich fruits & veggies; 6 or more servings  - Vegetables can be eaten raw or lightly drizzled with oil and cooked  - Vegetables common to the traditional Mediterranean Diet include: artichokes, arugula, beets, broccoli, brussel sprouts, cabbage, carrots, celery, collard greens, cucumbers, eggplant, kale, leeks, lemons, lettuce, mushrooms, okra, onions, peas, peppers, potatoes, pumpkin, radishes, rutabaga, shallots, spinach, sweet potatoes, turnips, zucchini - Fruits common to the Mediterranean Diet include: apples, apricots, avocados, cherries, clementines, dates, figs, grapefruits, grapes, melons, nectarines, oranges, peaches, pears, pomegranates, strawberries, tangerines  Fats - Replace butter and margarine with healthy oils, such as olive oil, canola oil, and tahini  -  Limit nuts to no more than a handful a day  - Nuts include walnuts, almonds, pecans, pistachios, pine nuts  - Limit or avoid candied, honey roasted or heavily salted nuts - Olives are central to the Mediterranean diet - can be eaten whole or used in a variety of dishes   Meats Protein - Limiting red meat: no more than a few times a month - When eating red meat: choose lean cuts and keep the portion to the size of deck of cards - Eggs: approx. 0 to 4 times a week  - Fish and lean poultry: at least 2 a week  - Healthy protein sources include, chicken, [place], lean beef, lamb - Increase intake of seafood  such as tuna, salmon, trout, mackerel, shrimp, scallops - Avoid or limit high fat processed meats such as sausage and bacon  Dairy - Include moderate amounts of low fat dairy products  - Focus on healthy dairy such as fat free yogurt, skim milk, low or reduced fat cheese - Limit dairy products higher in fat such as whole or 2% milk, cheese, ice cream  Alcohol - Moderate amounts of red wine is ok  - No more than 5 oz daily for women (all ages) and men older than age 56  - No more than 10 oz of wine daily for men younger than 9  Other - Limit sweets and other desserts  - Use herbs and spices instead of salt to flavor foods  - Herbs and spices common to the traditional Mediterranean Diet include: basil, bay leaves, chives, cloves, cumin, fennel, garlic, lavender, marjoram, mint, oregano, parsley, pepper, rosemary, sage, savory, sumac, tarragon, thyme   It's not just a diet, it's a lifestyle:  . The Mediterranean diet includes lifestyle factors typical of those in the region  . Foods, drinks and meals are best eaten with others and savored . Daily physical activity is important for overall good health . This could be strenuous exercise like running and aerobics . This could also be more leisurely activities such as walking, housework, yard-work, or taking the stairs . Moderation is the key; a balanced and healthy diet accommodates most foods and drinks . Consider portion sizes and frequency of consumption of certain foods   Meal Ideas & Options:  . Breakfast:  o Whole wheat toast or whole wheat English muffins with peanut butter & hard boiled egg o Steel cut oats topped with apples & cinnamon and skim milk  o Fresh fruit: banana, strawberries, melon, berries, peaches  o Smoothies: strawberries, bananas, greek yogurt, peanut butter o Low fat greek yogurt with blueberries and granola  o Egg white omelet with spinach and mushrooms o Breakfast couscous: whole wheat couscous, apricots, skim  milk, cranberries  . Sandwiches:  o Hummus and grilled vegetables (peppers, zucchini, squash) on whole wheat bread   o Grilled chicken on whole wheat pita with lettuce, tomatoes, cucumbers or tzatziki  o Tuna salad on whole wheat bread: tuna salad made with greek yogurt, olives, red peppers, capers, green onions o Garlic rosemary lamb pita: lamb sauted with garlic, rosemary, salt & pepper; add lettuce, cucumber, greek yogurt to pita - flavor with lemon juice and black pepper  . Seafood:  o Mediterranean grilled salmon, seasoned with garlic, basil, parsley, lemon juice and black pepper o Shrimp, lemon, and spinach whole-grain pasta salad made with low fat greek yogurt  o Seared scallops with lemon orzo  o Seared tuna steaks seasoned salt, pepper, coriander topped with tomato  mixture of olives, tomatoes, olive oil, minced garlic, parsley, green onions and cappers  . Meats:  o Herbed greek chicken salad with kalamata olives, cucumber, feta  o Red bell peppers stuffed with spinach, bulgur, lean ground beef (or lentils) & topped with feta   o Kebabs: skewers of chicken, tomatoes, onions, zucchini, squash  o [place] burgers: made with red onions, mint, dill, lemon juice, feta cheese topped with roasted red peppers . Vegetarian o Cucumber salad: cucumbers, artichoke hearts, celery, red onion, feta cheese, tossed in olive oil & lemon juice  o Hummus and whole grain pita points with a greek salad (lettuce, tomato, feta, olives, cucumbers, red onion) o Lentil soup with celery, carrots made with vegetable broth, garlic, salt and pepper  o Tabouli salad: parsley, bulgur, mint, scallions, cucumbers, tomato, radishes, lemon juice, olive oil, salt and pepper.      Mediterranean Diet A Mediterranean diet refers to food and lifestyle choices that are based on the traditions of countries located on the [employer]. This way of eating has been shown to help prevent certain conditions and improve outcomes  for people who have chronic diseases, like kidney disease and heart disease. What are tips for following this plan? Lifestyle  Cook and eat meals together with your family, when possible.  Drink enough fluid to keep your urine clear or pale yellow.  Be physically active every day. This includes: ? Aerobic exercise like running or swimming. ? Leisure activities like gardening, walking, or housework.  Get 7-8 hours of sleep each night.  If recommended by your health care provider, drink red wine in moderation. This means 1 glass a day for nonpregnant women and 2 glasses a day for men. A glass of wine equals 5 oz (150 mL). Reading food labels   Check the serving size of packaged foods. For foods such as rice and pasta, the serving size refers to the amount of cooked product, not dry.  Check the total fat in packaged foods. Avoid foods that have saturated fat or trans fats.  Check the ingredients list for added sugars, such as corn syrup. Shopping  At the grocery store, buy most of your food from the areas near the walls of the store. This includes: ? Fresh fruits and vegetables (produce). ? Grains, beans, nuts, and seeds. Some of these may be available in unpackaged forms or large amounts (in bulk). ? Fresh seafood. ? Poultry and eggs. ? Low-fat dairy products.  Buy whole ingredients instead of prepackaged foods.  Buy fresh fruits and vegetables in-season from local farmers markets.  Buy frozen fruits and vegetables in resealable bags.  If you do not have access to quality fresh seafood, buy precooked frozen shrimp or canned fish, such as tuna, salmon, or sardines.  Buy small amounts of raw or cooked vegetables, salads, or olives from the deli or salad bar at your store.  Stock your pantry so you always have certain foods on hand, such as olive oil, canned tuna, canned tomatoes, rice, pasta, and beans. Cooking  Cook foods with extra-virgin olive oil instead of using butter  or other vegetable oils.  Have meat as a side dish, and have vegetables or grains as your main dish. This means having meat in small portions or adding small amounts of meat to foods like pasta or stew.  Use beans or vegetables instead of meat in common dishes like chili or lasagna.  Experiment with different cooking methods. Try roasting or broiling vegetables instead of steaming  or sauteing them.  Add frozen vegetables to soups, stews, pasta, or rice.  Add nuts or seeds for added healthy fat at each meal. You can add these to yogurt, salads, or vegetable dishes.  Marinate fish or vegetables using olive oil, lemon juice, garlic, and fresh herbs. Meal planning   Plan to eat 1 vegetarian meal one day each week. Try to work up to 2 vegetarian meals, if possible.  Eat seafood 2 or more times a week.  Have healthy snacks readily available, such as: ? Vegetable sticks with hummus. ? [place] yogurt. ? Fruit and nut trail mix.  Eat balanced meals throughout the week. This includes: ? Fruit: 2-3 servings a day ? Vegetables: 4-5 servings a day ? Low-fat dairy: 2 servings a day ? Fish, poultry, or lean meat: 1 serving a day ? Beans and legumes: 2 or more servings a week ? Nuts and seeds: 1-2 servings a day ? Whole grains: 6-8 servings a day ? Extra-virgin olive oil: 3-4 servings a day  Limit red meat and sweets to only a few servings a month What are my food choices?  Mediterranean diet ? Recommended  Grains: Whole-grain pasta. Brown rice. Bulgar wheat. Polenta. Couscous. Whole-wheat bread. [place].  Vegetables: Artichokes. Beets. Broccoli. Cabbage. Carrots. Eggplant. Green beans. Chard. Kale. Spinach. Onions. Leeks. Peas. Squash. Tomatoes. Peppers. Radishes.  Fruits: Apples. Apricots. Avocado. Berries. Bananas. Cherries. Dates. Figs. Grapes. Lemons. Melon. Oranges. Peaches. Plums. Pomegranate.  Meats and other protein foods: Beans. Almonds. Sunflower seeds. Pine nuts.  Peanuts. [place]. Salmon. Scallops. Shrimp. [place]. Tilapia. Clams. Oysters. Eggs.  Dairy: Low-fat milk. Cheese. Greek yogurt.  Beverages: Water. Red wine. Herbal tea.  Fats and oils: Extra virgin olive oil. Avocado oil. Grape seed oil.  Sweets and desserts: [place] yogurt with honey. Baked apples. Poached pears. Trail mix.  Seasoning and other foods: Basil. Cilantro. Coriander. Cumin. Mint. Parsley. Sage. Rosemary. Tarragon. Garlic. Oregano. Thyme. Pepper. Balsalmic vinegar. Tahini. Hummus. Tomato sauce. Olives. Mushrooms. ? Limit these  Grains: Prepackaged pasta or rice dishes. Prepackaged cereal with added sugar.  Vegetables: Deep fried potatoes (french fries).  Fruits: Fruit canned in syrup.  Meats and other protein foods: Beef. Pork. Lamb. Poultry with skin. Hot dogs. Berniece Salines.  Dairy: Ice cream. Sour cream. Whole milk.  Beverages: Juice. Sugar-sweetened soft drinks. Beer. Liquor and spirits.  Fats and oils: Butter. Canola oil. Vegetable oil. Beef fat (tallow). Lard.  Sweets and desserts: Cookies. Cakes. Pies. Candy.  Seasoning and other foods: Mayonnaise. Premade sauces and marinades. The items listed may not be a complete list. Talk with your dietitian about what dietary choices are right for you. Summary  The Mediterranean diet includes both food and lifestyle choices.  Eat a variety of fresh fruits and vegetables, beans, nuts, seeds, and whole grains.  Limit the amount of red meat and sweets that you eat.  Talk with your health care provider about whether it is safe for you to drink red wine in moderation. This means 1 glass a day for nonpregnant women and 2 glasses a day for men. A glass of wine equals 5 oz (150 mL). This information is not intended to replace advice given to you by your health care provider. Make sure you discuss any questions you have with your health care provider. Document Released: 02/26/2016 Document Revised: 03/04/2016 Document Reviewed:  02/26/2016 Elsevier Patient Education  2020 [employer].

## 2019-02-05 NOTE — Telephone Encounter (Signed)
Signed FMLA form returned to [place] at [place]. 02/05/19 vlm

## 2019-02-05 NOTE — Telephone Encounter (Signed)
Patient had appt today

## 2019-02-08 ENCOUNTER — Encounter: Payer: Self-pay | Admitting: Internal Medicine

## 2019-02-08 ENCOUNTER — Other Ambulatory Visit: Payer: Self-pay

## 2019-02-08 ENCOUNTER — Ambulatory Visit (INDEPENDENT_AMBULATORY_CARE_PROVIDER_SITE_OTHER): Payer: 59 | Admitting: Internal Medicine

## 2019-02-08 ENCOUNTER — Other Ambulatory Visit (INDEPENDENT_AMBULATORY_CARE_PROVIDER_SITE_OTHER): Payer: 59

## 2019-02-08 VITALS — BP 132/78 | HR 84 | Temp 98.3°F | Resp 16 | Ht 67.0 in | Wt 311.0 lb

## 2019-02-08 DIAGNOSIS — E559 Vitamin D deficiency, unspecified: Secondary | ICD-10-CM | POA: Diagnosis not present

## 2019-02-08 DIAGNOSIS — Z1211 Encounter for screening for malignant neoplasm of colon: Secondary | ICD-10-CM

## 2019-02-08 DIAGNOSIS — D509 Iron deficiency anemia, unspecified: Secondary | ICD-10-CM

## 2019-02-08 DIAGNOSIS — L509 Urticaria, unspecified: Secondary | ICD-10-CM

## 2019-02-08 DIAGNOSIS — I10 Essential (primary) hypertension: Secondary | ICD-10-CM

## 2019-02-08 DIAGNOSIS — R7303 Prediabetes: Secondary | ICD-10-CM | POA: Diagnosis not present

## 2019-02-08 DIAGNOSIS — I272 Pulmonary hypertension, unspecified: Secondary | ICD-10-CM

## 2019-02-08 DIAGNOSIS — D234 Other benign neoplasm of skin of scalp and neck: Secondary | ICD-10-CM | POA: Insufficient documentation

## 2019-02-08 DIAGNOSIS — Z0000 Encounter for general adult medical examination without abnormal findings: Secondary | ICD-10-CM | POA: Insufficient documentation

## 2019-02-08 DIAGNOSIS — L2084 Intrinsic (allergic) eczema: Secondary | ICD-10-CM

## 2019-02-08 DIAGNOSIS — I872 Venous insufficiency (chronic) (peripheral): Secondary | ICD-10-CM

## 2019-02-08 LAB — LIPID PANEL
Cholesterol: 206 mg/dL — ABNORMAL HIGH (ref 0–200)
HDL: 52.5 mg/dL (ref >39.00)
LDL Cholesterol: 130 mg/dL — ABNORMAL HIGH (ref 0–99)
NonHDL: 153.39
Total CHOL/HDL Ratio: 4
Triglycerides: 116 mg/dL (ref 0.0–149.0)
VLDL: 23.2 mg/dL (ref 0.0–40.0)

## 2019-02-08 LAB — BASIC METABOLIC PANEL
BUN: 9 mg/dL (ref 6–23)
CO2: 30 mEq/L (ref 19–32)
Calcium: 9.6 mg/dL (ref 8.4–10.5)
Chloride: 103 mEq/L (ref 96–112)
Creatinine, Ser: 0.77 mg/dL (ref 0.40–1.20)
GFR: 95.68 mL/min (ref >60.00)
Glucose, Bld: 91 mg/dL (ref 70–99)
Potassium: 4 mEq/L (ref 3.5–5.1)
Sodium: 140 mEq/L (ref 135–145)

## 2019-02-08 LAB — CBC WITH DIFFERENTIAL/PLATELET
Basophils Absolute: 0 10*3/uL (ref 0.0–0.1)
Basophils Relative: 0.4 % (ref 0.0–3.0)
Eosinophils Absolute: 0.1 10*3/uL (ref 0.0–0.7)
Eosinophils Relative: 1.4 % (ref 0.0–5.0)
HCT: 37.8 % (ref 36.0–46.0)
Hemoglobin: 12.2 g/dL (ref 12.0–15.0)
Lymphocytes Relative: 34.2 % (ref 12.0–46.0)
Lymphs Abs: 2.7 10*3/uL (ref 0.7–4.0)
MCHC: 32.4 g/dL (ref 30.0–36.0)
MCV: 86.6 fl (ref 78.0–100.0)
Monocytes Absolute: 0.3 10*3/uL (ref 0.1–1.0)
Monocytes Relative: 3.8 % (ref 3.0–12.0)
Neutro Abs: 4.7 10*3/uL (ref 1.4–7.7)
Neutrophils Relative %: 60.2 % (ref 43.0–77.0)
Platelets: 332 10*3/uL (ref 150.0–400.0)
RBC: 4.37 Mil/uL (ref 3.87–5.11)
RDW: 14.2 % (ref 11.5–15.5)
WBC: 7.9 10*3/uL (ref 4.0–10.5)

## 2019-02-08 LAB — TSH: TSH: 1.62 u[IU]/mL (ref 0.35–4.50)

## 2019-02-08 LAB — IBC PANEL
Iron: 49 ug/dL (ref 42–145)
Saturation Ratios: 13.3 % — ABNORMAL LOW (ref 20.0–50.0)
Transferrin: 263 mg/dL (ref 212.0–360.0)

## 2019-02-08 LAB — HEPATIC FUNCTION PANEL
ALT: 9 U/L (ref 0–35)
AST: 10 U/L (ref 0–37)
Albumin: 4.1 g/dL (ref 3.5–5.2)
Alkaline Phosphatase: 76 U/L (ref 39–117)
Bilirubin, Direct: 0.1 mg/dL (ref 0.0–0.3)
Total Bilirubin: 0.4 mg/dL (ref 0.2–1.2)
Total Protein: 7.7 g/dL (ref 6.0–8.3)

## 2019-02-08 LAB — HEMOGLOBIN A1C: Hgb A1c MFr Bld: 6.2 % (ref 4.6–6.5)

## 2019-02-08 LAB — FERRITIN: Ferritin: 23.3 ng/mL (ref 10.0–291.0)

## 2019-02-08 MED ORDER — DOXEPIN HCL 10 MG PO CAPS: 10.0000 mg | ORAL_CAPSULE | Freq: Every day | ORAL | 1 refills | Status: DC

## 2019-02-08 MED ORDER — EUCRISA 2 % EX OINT: 1.0000 | TOPICAL_OINTMENT | Freq: Two times a day (BID) | CUTANEOUS | 3 refills | Status: DC

## 2019-02-08 NOTE — Progress Notes (Signed)
Subjective:  Patient ID: Jessica Davenport, female    DOB: 12/30/1965  Age: 53 y.o. MRN: [id number]  CC: Annual Exam and Rash   HPI Jeannette BRIER FIREBAUGH presents for a CPX.  She complains of chronic recurrent itchy rash.  This has been treated intermittently as hives.  She describes itchy whelps that come and go from different areas.  She also has a chronic rash on both lower extremities anteriorly.  She tells me she is seeing a dermatologist and was told that this is a combination of chronic venous insufficiency and eczema.  She is also not treating this.    She complains of a cyst on the right side of her neck that is been growing over the last few years.  She wants to have it removed.  Outpatient Medications Prior to Visit  Medication Sig Dispense Refill  . albuterol (VENTOLIN HFA) 108 (90 Base) MCG/ACT inhaler Inhale 1-2 puffs into the lungs every 6 (six) hours as needed. 1 Inhaler 0  . furosemide (LASIX) 40 MG tablet Take 1 tablet (40 mg total) by mouth daily. 90 tablet 3  . olmesartan (BENICAR) 20 MG tablet Take 20 mg by mouth daily.    . potassium chloride SA (K-DUR,KLOR-CON) 20 MEQ tablet Take 1 tablet (20 mEq total) by mouth daily. 90 tablet 3  . aspirin 81 MG tablet Take 81 mg by mouth daily.     No facility-administered medications prior to visit.     ROS Review of Systems  Constitutional: Positive for unexpected weight change (wt gain). Negative for appetite change, chills, diaphoresis and fatigue.  HENT: Negative.   Eyes: Negative.   Respiratory: Negative.  Negative for chest tightness, shortness of breath and wheezing.   Cardiovascular: Negative for chest pain, palpitations and leg swelling.  Gastrointestinal: Negative for abdominal pain, constipation, diarrhea and vomiting.  Endocrine: Negative.   Genitourinary: Negative.  Negative for decreased urine volume, difficulty urinating, dysuria, frequency and urgency.  Musculoskeletal: Negative.  Negative for  arthralgias, joint swelling and myalgias.  Skin: Positive for color change and rash. Negative for pallor and wound.  Neurological: Negative.  Negative for dizziness, weakness, light-headedness and headaches.  Hematological: Negative for adenopathy. Does not bruise/bleed easily.  Psychiatric/Behavioral: Negative.     Objective:  BP 132/78 (BP Location: Left Arm, Patient Position: Sitting, Cuff Size: Large)   Pulse 84   Temp 98.3 F (36.8 C) (Oral)   Resp 16   Ht 5\' 7"  (1.702 m)   Wt (!) 311 lb (141.1 kg)   LMP 11/10/2011   SpO2 96%   BMI 48.71 kg/m   BP Readings from Last 3 Encounters:  02/08/19 132/78  02/05/19 128/90  01/22/19 128/70    Wt Readings from Last 3 Encounters:  02/08/19 (!) 311 lb (141.1 kg)  02/05/19 (!) 309 lb 12.8 oz (140.5 kg)  01/03/19 185 lb (83.9 kg)    Physical Exam Vitals signs reviewed.  Constitutional:      General: She is not in acute distress.    Appearance: She is obese. She is not ill-appearing, toxic-appearing or diaphoretic.  HENT:     Nose: Nose normal. No congestion.     Mouth/Throat:     Pharynx: Oropharynx is clear.  Eyes:     General: No scleral icterus.    Conjunctiva/sclera: Conjunctivae normal.  Neck:     Musculoskeletal: Normal range of motion. No neck rigidity.     Thyroid: No thyroid mass,  thyromegaly or thyroid tenderness.   Cardiovascular:     Rate and Rhythm: Normal rate and regular rhythm.     Pulses: Normal pulses.     Heart sounds: No murmur. No gallop.   Pulmonary:     Effort: Pulmonary effort is normal.     Breath sounds: No stridor. No wheezing, rhonchi or rales.  Abdominal:     General: Abdomen is protuberant. Bowel sounds are normal. There is no distension.     Palpations: There is no hepatomegaly or splenomegaly.     Tenderness: There is no abdominal tenderness.  Musculoskeletal: Normal range of motion.        General: No swelling.     Right lower leg: No edema.     Left lower leg: No edema.   Lymphadenopathy:     Cervical: No cervical adenopathy.  Skin:    General: Skin is warm.     Findings: Rash present. Rash is urticarial. Rash is not crusting, macular, nodular, papular, purpuric, pustular, scaling or vesicular.       Neurological:     General: No focal deficit present.     Mental Status: She is alert and oriented to person, place, and time. Mental status is at baseline.  Psychiatric:        Mood and Affect: Mood normal.        Behavior: Behavior normal.     Lab Results  Component Value Date   WBC 7.9 02/08/2019   HGB 12.2 02/08/2019   HCT 37.8 02/08/2019   PLT 332.0 02/08/2019   GLUCOSE 91 02/08/2019   CHOL 206 (H) 02/08/2019   TRIG 116.0 02/08/2019   HDL 52.50 02/08/2019   LDLDIRECT 148.1 06/16/2012   LDLCALC 130 (H) 02/08/2019   ALT 9 02/08/2019   AST 10 02/08/2019   NA 140 02/08/2019   K 4.0 02/08/2019   CL 103 02/08/2019   CREATININE 0.77 02/08/2019   BUN 9 02/08/2019   CO2 30 02/08/2019   TSH 1.62 02/08/2019   HGBA1C 6.2 02/08/2019    Mm 3d Screen Breast Bilateral  Result Date: 02/13/2018 CLINICAL DATA:  Screening. EXAM: DIGITAL SCREENING BILATERAL MAMMOGRAM WITH TOMO AND CAD COMPARISON:  Previous exam(s). ACR Breast Density Category a: The breast tissue is almost entirely fatty. FINDINGS: There are no findings suspicious for malignancy. Images were processed with CAD. IMPRESSION: No mammographic evidence of malignancy. A result letter of this screening mammogram will be mailed directly to the patient. RECOMMENDATION: Screening mammogram in one year. (Code:SM-B-01Y) BI-RADS CATEGORY  2: Benign. Electronically Signed   By: Evangeline Dakin M.D.   On: 02/13/2018 08:08    Assessment & Plan:   Fradel was seen today for annual exam and rash.  Diagnoses and all orders for this visit:  Essential hypertension- Her blood pressure is adequately well controlled.  Electrolytes and renal function are normal. -     Cancel: Basic metabolic panel; Future -      Basic metabolic panel; Future  Iron deficiency anemia, unspecified iron deficiency anemia type- Her H&H and iron levels are normal now. -     CBC with Differential/Platelet; Future -     IBC panel; Future -     Ferritin; Future  Vitamin D deficiency  Urticaria -     doxepin (SINEQUAN) 10 MG capsule; Take 1 capsule (10 mg total) by mouth at bedtime.  Intrinsic eczema -     doxepin (SINEQUAN) 10 MG capsule; Take 1 capsule (10 mg total) by mouth at  bedtime. -     Crisaborole (EUCRISA) 2 % OINT; Apply 1 Act topically 2 (two) times a day.  Prediabetes -     Hemoglobin A1c; Future  Pulmonary HTN (HCC)  Stasis dermatitis of both legs  Routine general medical examination at a health care facility- Exam completed, labs reviewed, vaccines reviewed, will order Cologuard to screen for colon cancer/polyps, Pap and mammogram are up-to-date, patient education material was given. -     Lipid panel; Future  Obesity, Class III, BMI 40-49.9 (morbid obesity) ([place])- Labs are negative for secondary causes.  She agrees to work on her lifestyle modifications. -     Hepatic function panel; Future -     TSH; Future  Cyst, dermoid, scalp and neck -     Ambulatory referral to ENT   I have discontinued Bhumi Y. Kouns's aspirin. I am also having her start on doxepin and Eucrisa. Additionally, I am having her maintain her potassium chloride SA, albuterol, furosemide, and olmesartan.  Meds ordered this encounter  Medications  . doxepin (SINEQUAN) 10 MG capsule    Sig: Take 1 capsule (10 mg total) by mouth at bedtime.    Dispense:  90 capsule    Refill:  1  . Crisaborole (EUCRISA) 2 % OINT    Sig: Apply 1 Act topically 2 (two) times a day.    Dispense:  100 g    Refill:  3     Follow-up: Return in about 3 months (around 05/11/2019).  [name], MD

## 2019-02-08 NOTE — Patient Instructions (Signed)
Health Maintenance, Female Adopting a healthy lifestyle and getting preventive care are important in promoting health and wellness. Ask your health care provider about:  The right schedule for you to have regular tests and exams.  Things you can do on your own to prevent diseases and keep yourself healthy. What should I know about diet, weight, and exercise? Eat a healthy diet   Eat a diet that includes plenty of vegetables, fruits, low-fat dairy products, and lean protein.  Do not eat a lot of foods that are high in solid fats, added sugars, or sodium. Maintain a healthy weight Body mass index (BMI) is used to identify weight problems. It estimates body fat based on height and weight. Your health care provider can help determine your BMI and help you achieve or maintain a healthy weight. Get regular exercise Get regular exercise. This is one of the most important things you can do for your health. Most adults should:  Exercise for at least 150 minutes each week. The exercise should increase your heart rate and make you sweat (moderate-intensity exercise).  Do strengthening exercises at least twice a week. This is in addition to the moderate-intensity exercise.  Spend less time sitting. Even light physical activity can be beneficial. Watch cholesterol and blood lipids Have your blood tested for lipids and cholesterol at 51 years of age, then have this test every 5 years. Have your cholesterol levels checked more often if:  Your lipid or cholesterol levels are high.  You are older than 51 years of age.  You are at high risk for heart disease. What should I know about cancer screening? Depending on your health history and family history, you may need to have cancer screening at various ages. This may include screening for:  Breast cancer.  Cervical cancer.  Colorectal cancer.  Skin cancer.  Lung cancer. What should I know about heart disease, diabetes, and high blood  pressure? Blood pressure and heart disease  High blood pressure causes heart disease and increases the risk of stroke. This is more likely to develop in people who have high blood pressure readings, are of African descent, or are overweight.  Have your blood pressure checked: ? Every 3-5 years if you are 18-39 years of age. ? Every year if you are 40 years old or older. Diabetes Have regular diabetes screenings. This checks your fasting blood sugar level. Have the screening done:  Once every three years after age 40 if you are at a normal weight and have a low risk for diabetes.  More often and at a younger age if you are overweight or have a high risk for diabetes. What should I know about preventing infection? Hepatitis B If you have a higher risk for hepatitis B, you should be screened for this virus. Talk with your health care provider to find out if you are at risk for hepatitis B infection. Hepatitis C Testing is recommended for:  Everyone born from 1945 through 1965.  Anyone with known risk factors for hepatitis C. Sexually transmitted infections (STIs)  Get screened for STIs, including gonorrhea and chlamydia, if: ? You are sexually active and are younger than 51 years of age. ? You are older than 51 years of age and your health care provider tells you that you are at risk for this type of infection. ? Your sexual activity has changed since you were last screened, and you are at increased risk for chlamydia or gonorrhea. Ask your health care provider if   you are at risk.  Ask your health care provider about whether you are at high risk for HIV. Your health care provider may recommend a prescription medicine to help prevent HIV infection. If you choose to take medicine to prevent HIV, you should first get tested for HIV. You should then be tested every 3 months for as long as you are taking the medicine. Pregnancy  If you are about to stop having your period (premenopausal) and  you may become pregnant, seek counseling before you get pregnant.  Take 400 to 800 micrograms (mcg) of folic acid every day if you become pregnant.  Ask for birth control (contraception) if you want to prevent pregnancy. Osteoporosis and menopause Osteoporosis is a disease in which the bones lose minerals and strength with aging. This can result in bone fractures. If you are 65 years old or older, or if you are at risk for osteoporosis and fractures, ask your health care provider if you should:  Be screened for bone loss.  Take a calcium or vitamin D supplement to lower your risk of fractures.  Be given hormone replacement therapy (HRT) to treat symptoms of menopause. Follow these instructions at home: Lifestyle  Do not use any products that contain nicotine or tobacco, such as cigarettes, e-cigarettes, and chewing tobacco. If you need help quitting, ask your health care provider.  Do not use street drugs.  Do not share needles.  Ask your health care provider for help if you need support or information about quitting drugs. Alcohol use  Do not drink alcohol if: ? Your health care provider tells you not to drink. ? You are pregnant, may be pregnant, or are planning to become pregnant.  If you drink alcohol: ? Limit how much you use to 0-1 drink a day. ? Limit intake if you are breastfeeding.  Be aware of how much alcohol is in your drink. In the [place] one drink equals one 12 oz bottle of beer (355 mL), one 5 oz glass of wine (148 mL), or one 1 oz glass of hard liquor (44 mL). General instructions  Schedule regular health, dental, and eye exams.  Stay current with your vaccines.  Tell your health care provider if: ? You often feel depressed. ? You have ever been abused or do not feel safe at home. Summary  Adopting a healthy lifestyle and getting preventive care are important in promoting health and wellness.  Follow your health care provider's instructions about healthy  diet, exercising, and getting tested or screened for diseases.  Follow your health care provider's instructions on monitoring your cholesterol and blood pressure. This information is not intended to replace advice given to you by your health care provider. Make sure you discuss any questions you have with your health care provider. Document Released: 01/18/2011 Document Revised: 06/28/2018 Document Reviewed: 06/28/2018 Elsevier Patient Education  2020 [employer].  

## 2019-02-16 NOTE — Addendum Note (Signed)
Addended by: Aviva Signs M on: 02/16/2019 10:55 AM   Modules accepted: Orders

## 2019-02-21 ENCOUNTER — Telehealth: Payer: Self-pay | Admitting: Interventional Cardiology

## 2019-02-21 NOTE — Telephone Encounter (Signed)
FMLA form placed in box for Dr. Irish Lack to review. 02/21/19 vlm

## 2019-02-23 ENCOUNTER — Other Ambulatory Visit: Payer: Self-pay | Admitting: Obstetrics and Gynecology

## 2019-02-23 DIAGNOSIS — Z1231 Encounter for screening mammogram for malignant neoplasm of breast: Secondary | ICD-10-CM

## 2019-02-26 ENCOUNTER — Telehealth: Payer: Self-pay | Admitting: Interventional Cardiology

## 2019-02-26 NOTE — Telephone Encounter (Signed)
Signed form returned to [place] at [place]. 02/26/19 vlm

## 2019-03-08 LAB — COLOGUARD: Cologuard: NEGATIVE

## 2019-03-09 ENCOUNTER — Encounter: Payer: Self-pay | Admitting: Internal Medicine

## 2019-03-12 ENCOUNTER — Other Ambulatory Visit: Payer: Self-pay | Admitting: Otolaryngology

## 2019-03-12 DIAGNOSIS — Q180 Sinus, fistula and cyst of branchial cleft: Secondary | ICD-10-CM

## 2019-03-21 ENCOUNTER — Ambulatory Visit
Admission: RE | Admit: 2019-03-21 | Discharge: 2019-03-21 | Disposition: A | Discharge status: Home / Self Care | Payer: 59 | Source: Ambulatory Visit

## 2019-03-21 ENCOUNTER — Other Ambulatory Visit: Payer: Self-pay

## 2019-03-21 ENCOUNTER — Ambulatory Visit
Admission: RE | Admit: 2019-03-21 | Discharge: 2019-03-21 | Disposition: A | Discharge status: Home / Self Care | Payer: 59 | Source: Ambulatory Visit | Attending: Otolaryngology | Admitting: Otolaryngology

## 2019-03-21 DIAGNOSIS — Z1231 Encounter for screening mammogram for malignant neoplasm of breast: Secondary | ICD-10-CM

## 2019-03-21 DIAGNOSIS — Q180 Sinus, fistula and cyst of branchial cleft: Secondary | ICD-10-CM

## 2019-03-21 MED ORDER — IOPAMIDOL (ISOVUE-300) INJECTION 61%
75.0000 mL | Freq: Once | INTRAVENOUS | Status: AC | PRN
  Administered 2019-03-21: 14:00:00 75 mL via INTRAVENOUS

## 2019-04-10 ENCOUNTER — Ambulatory Visit: Payer: 59 | Admitting: Internal Medicine

## 2019-04-16 ENCOUNTER — Ambulatory Visit: Payer: 59 | Admitting: Internal Medicine

## 2019-04-18 ENCOUNTER — Ambulatory Visit: Payer: 59 | Admitting: Internal Medicine

## 2019-04-30 ENCOUNTER — Other Ambulatory Visit: Payer: Self-pay | Admitting: Nurse Practitioner

## 2019-05-02 ENCOUNTER — Other Ambulatory Visit: Payer: Self-pay | Admitting: Internal Medicine

## 2019-05-02 ENCOUNTER — Telehealth: Payer: Self-pay | Admitting: Internal Medicine

## 2019-05-02 DIAGNOSIS — L2084 Intrinsic (allergic) eczema: Secondary | ICD-10-CM

## 2019-05-02 NOTE — Telephone Encounter (Signed)
Patient called in stating medication that was prescribed by doctor doxepin (SINEQUAN) 10 MG  isnt working and rash is now spreading up her arm . Please advise   Call back [phone]

## 2019-05-02 NOTE — Telephone Encounter (Signed)
Derm referral entered  [place]

## 2019-05-03 NOTE — Telephone Encounter (Signed)
Message sent to patient via mychart

## 2019-05-09 NOTE — Telephone Encounter (Signed)
Can you look into this referral please?

## 2019-05-09 NOTE — Telephone Encounter (Signed)
Pt stated the hives are now on her feet which had made them swell. Pt stated she has not heard from the dermatologist office. Pt requests call back with the name of the dermatologist office and their contact information.

## 2019-07-19 ENCOUNTER — Other Ambulatory Visit: Payer: Self-pay | Admitting: Nurse Practitioner

## 2019-08-17 ENCOUNTER — Telehealth: Payer: Self-pay | Admitting: Interventional Cardiology

## 2019-08-17 NOTE — Telephone Encounter (Signed)
The patient is calling in hopes of getting a work note from Dr. [name].   Pt states that there have been several cases of COVID in her office and she is concerned about getting the virus. Pt states she does not think her employers are being truthful regarding positive cases and she feels as though her safety is at risk.   Pt states she has discussed this with Dr. [name] before and he has written her a similar note previously.  Will route to JV for advisement.

## 2019-08-17 NOTE — Telephone Encounter (Signed)
Patient returning call.

## 2019-08-17 NOTE — Telephone Encounter (Signed)
I spoke to the patient and provided letter.  She will pick up Monday.

## 2019-08-17 NOTE — Telephone Encounter (Signed)
Lpm with Dr Hassell Done letter recommendation.  Awaiting patient's response.  1/29

## 2019-08-17 NOTE — Telephone Encounter (Signed)
Given her risks for severe illness with COVID-19 given obesity (anticipating weight loss surgery), ok to have her work from home for 2 weeks.   JV

## 2019-08-17 NOTE — Telephone Encounter (Signed)
Patient calling stating there has been many people in her office who have tested positive covid.  She would like a note for work so she can work from home.

## 2019-08-23 ENCOUNTER — Telehealth: Payer: Self-pay

## 2019-08-23 MED ORDER — OLMESARTAN MEDOXOMIL 20 MG PO TABS: 20.0000 mg | ORAL_TABLET | Freq: Every day | ORAL | 0 refills | Status: DC

## 2019-08-23 NOTE — Telephone Encounter (Signed)
Patient is requesting a call back from [place]. Mentioned that she is feeling like she's not fast enough and wore down. Mentions that there has been a death at her work from [employer] and it seemed to bother her. Please advise. CB#: [phone]

## 2019-08-23 NOTE — Telephone Encounter (Signed)
Pt has been scheduled to see Dr. Ronnald Ramp on Monday.  Pt stated that she was out of BP medications. 30 to HT has been sent in.

## 2019-08-27 ENCOUNTER — Ambulatory Visit: Payer: 59 | Admitting: Internal Medicine

## 2019-08-27 ENCOUNTER — Other Ambulatory Visit: Payer: Self-pay

## 2019-08-27 ENCOUNTER — Encounter: Payer: Self-pay | Admitting: Internal Medicine

## 2019-08-27 VITALS — BP 160/92 | HR 73 | Temp 98.5°F | Ht 67.0 in | Wt 315.5 lb

## 2019-08-27 DIAGNOSIS — Z23 Encounter for immunization: Secondary | ICD-10-CM | POA: Diagnosis not present

## 2019-08-27 DIAGNOSIS — I10 Essential (primary) hypertension: Secondary | ICD-10-CM | POA: Diagnosis not present

## 2019-08-27 DIAGNOSIS — F4322 Adjustment disorder with anxiety: Secondary | ICD-10-CM | POA: Diagnosis not present

## 2019-08-27 MED ORDER — CLONAZEPAM 0.5 MG PO TABS: 0.5000 mg | ORAL_TABLET | Freq: Two times a day (BID) | ORAL | 1 refills | Status: DC | PRN

## 2019-08-27 MED ORDER — INDAPAMIDE 1.25 MG PO TABS: 1.2500 mg | ORAL_TABLET | Freq: Every day | ORAL | 0 refills | Status: DC

## 2019-08-27 NOTE — Progress Notes (Signed)
Subjective:  Patient ID: Jessica Davenport, female    DOB: 09/02/1965  Age: 54 y.o. MRN: [id number]  CC: Hypertension  This visit occurred during the SARS-CoV-2 public health emergency.  Safety protocols were in place, including screening questions prior to the visit, additional usage of staff PPE, and extensive cleaning of exam room while observing appropriate contact time as indicated for disinfecting solutions.    HPI Jessica Davenport presents for f/up - She complains that there have been many COVID-19 cases at her work place and she has been feeling anxious and overwhelmed. She denies sadness or crying spells.-, SI/HI, or feeling hopeless/helpless. She complains of insomnia and weight gain. She has not been working on her lifestyle modifications and tells me that her BP has not been well controlled.  Outpatient Medications Prior to Visit  Medication Sig Dispense Refill  . cimetidine (TAGAMET) 400 MG tablet     . clobetasol cream (TEMOVATE) [id number] % Apply 1 application topically 2 (two) times daily.    Marland Kitchen doxepin (SINEQUAN) 10 MG capsule Take 1 capsule (10 mg total) by mouth at bedtime. 90 capsule 1  . furosemide (LASIX) 40 MG tablet Take 1 tablet (40 mg total) by mouth daily. 90 tablet 3  . hydrOXYzine (VISTARIL) 25 MG capsule Take 25 mg by mouth 2 (two) times daily.    Marland Kitchen olmesartan (BENICAR) 20 MG tablet Take 1 tablet (20 mg total) by mouth daily. 30 tablet 0  . omeprazole (PRILOSEC) 20 MG capsule Take by mouth.    . potassium chloride SA (K-DUR,KLOR-CON) 20 MEQ tablet Take 1 tablet (20 mEq total) by mouth daily. 90 tablet 3  . albuterol (VENTOLIN HFA) 108 (90 Base) MCG/ACT inhaler Inhale 1-2 puffs into the lungs every 6 (six) hours as needed. 1 Inhaler 0  . amoxicillin (AMOXIL) 500 MG capsule     . Crisaborole (EUCRISA) 2 % OINT Apply 1 Act topically 2 (two) times a day. 100 g 3  . naproxen sodium (ALEVE) 220 MG tablet Take by mouth.    . traMADol (ULTRAM) 50 MG tablet      No  facility-administered medications prior to visit.    ROS Review of Systems  Constitutional: Positive for unexpected weight change (wt gain). Negative for appetite change, diaphoresis and fatigue.  Eyes: Negative.   Respiratory: Negative for cough, chest tightness, shortness of breath and wheezing.   Cardiovascular: Positive for leg swelling.  Gastrointestinal: Negative for abdominal pain, constipation, diarrhea, nausea and vomiting.  Endocrine: Negative.   Genitourinary: Negative.  Negative for difficulty urinating.  Musculoskeletal: Negative.  Negative for arthralgias, back pain and myalgias.  Skin: Negative.  Negative for color change and pallor.  Neurological: Negative.  Negative for dizziness, weakness, light-headedness and headaches.  Hematological: Negative for adenopathy. Does not bruise/bleed easily.  Psychiatric/Behavioral: Positive for sleep disturbance. Negative for behavioral problems, decreased concentration, dysphoric mood, hallucinations, self-injury and suicidal ideas. The patient is nervous/anxious.     Objective:  BP (!) 160/92 (BP Location: Left Arm, Patient Position: Sitting, Cuff Size: Large)   Pulse 73   Temp 98.5 F (36.9 C) (Oral)   Ht 5\' 7"  (1.702 m)   Wt (!) 315 lb 8 oz (143.1 kg)   LMP 11/10/2011   SpO2 98%   BMI 49.41 kg/m   BP Readings from Last 3 Encounters:  08/27/19 (!) 160/92  02/08/19 132/78  02/05/19 128/90    Wt Readings from Last 3 Encounters:  08/27/19 (!) 315 lb 8 oz (143.1 kg)  02/08/19 (!) 311 lb (141.1 kg)  02/05/19 (!) 309 lb 12.8 oz (140.5 kg)    Physical Exam Vitals reviewed.  Constitutional:      Appearance: She is obese.  HENT:     Nose: Nose normal.  Eyes:     General: No scleral icterus.    Conjunctiva/sclera: Conjunctivae normal.  Cardiovascular:     Rate and Rhythm: Normal rate and regular rhythm.     Heart sounds: No murmur. No gallop.   Pulmonary:     Effort: Pulmonary effort is normal.     Breath sounds: No  stridor. No wheezing, rhonchi or rales.  Abdominal:     General: Abdomen is protuberant. Bowel sounds are normal. There is no distension.     Palpations: Abdomen is soft. There is no hepatomegaly or splenomegaly.     Tenderness: There is no abdominal tenderness.  Musculoskeletal:        General: No swelling. Normal range of motion.     Cervical back: Neck supple.     Right lower leg: Edema (trace) present.     Left lower leg: Edema (trace) present.  Lymphadenopathy:     Cervical: No cervical adenopathy.  Skin:    General: Skin is warm and dry.     Coloration: Skin is not pale.  Neurological:     General: No focal deficit present.     Mental Status: She is alert.  Psychiatric:        Mood and Affect: Mood normal.        Behavior: Behavior normal.     Lab Results  Component Value Date   WBC 7.9 02/08/2019   HGB 12.2 02/08/2019   HCT 37.8 02/08/2019   PLT 332.0 02/08/2019   GLUCOSE 91 02/08/2019   CHOL 206 (H) 02/08/2019   TRIG 116.0 02/08/2019   HDL 52.50 02/08/2019   LDLDIRECT 148.1 06/16/2012   LDLCALC 130 (H) 02/08/2019   ALT 9 02/08/2019   AST 10 02/08/2019   NA 140 02/08/2019   K 4.0 02/08/2019   CL 103 02/08/2019   CREATININE 0.77 02/08/2019   BUN 9 02/08/2019   CO2 30 02/08/2019   TSH 1.62 02/08/2019   HGBA1C 6.2 02/08/2019    CT SOFT TISSUE NECK W CONTRAST  Addendum Date: 03/28/2019   ADDENDUM REPORT: 03/28/2019 12:55 ADDENDUM: The 1.5 cm predominantly fat density subcutaneous lesion within the mid right neck may reflect a lipoma as initially described. However, given the nonspecific 4 mm peripheral nodular soft tissue component, clinical follow-up is recommended. Repeat contrast-enhanced CT imaging is recommended if this palpable abnormality is enlarging at time of clinical follow-up. This addendum and recommendations will be called to the ordering clinician or representative by the Radiologist Assistant, and communication documented in the PACS or zVision  Dashboard. Electronically Signed   By: Kellie Simmering   On: 03/28/2019 12:55   Result Date: 03/28/2019 CLINICAL DATA:  Branchial cleft cyst EXAM: CT NECK WITH CONTRAST TECHNIQUE: Multidetector CT imaging of the neck was performed using the standard protocol following the bolus administration of intravenous contrast. CONTRAST:  9mL ISOVUE-300 IOPAMIDOL (ISOVUE-300) INJECTION 61% COMPARISON:  No pertinent prior studies available for comparison. FINDINGS: Pharynx and larynx: Several punctate calcifications within the palatine tonsils. Nonspecific asymmetric prominence of the lingual tonsils on the left. The pharynx and larynx are otherwise unremarkable. Salivary glands: No inflammation, mass, or stone. Thyroid: Unremarkable Lymph nodes: No pathologically enlarged cervical chain lymph nodes Vascular: The major vascular structures of the neck are  patent. Limited intracranial: Partially empty sella turcica. Otherwise unremarkable. Visualized orbits: No acute abnormality Mastoids and visualized paranasal sinuses: No significant paranasal sinus disease or mastoid effusion. Skeleton: No suspicious lytic or blastic osseous lesions Upper chest: No confluent consolidation within the imaged lung apices. Other: Within the subcutaneous soft tissues of the mid right neck, immediately beneath a skin surface marker, there is a well-circumscribed 1.5 x 1.1 cm round predominantly fat density lesion (series 2, image 53) (series 10, image 57). There is a 4 mm eccentric soft tissue component. 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 Overlying this lesion and immediately adjacent to the skin surface marker there is a nonspecific 0.7 x 0.4 cm focus of cutaneous/subcutaneous induration (series 2, image 53). IMPRESSION: Within the mid right neck immediately beneath a skin surface marker, there is a 1.5 x 1.1 cm predominantly fat density subcutaneous lesion with small eccentric soft tissue component. 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  Overlying this lesion, there is a nonspecific 0.7 cm focus of cutaneous/subcutaneous induration. Correlate with direct visualization. Nonspecific prominence of the lingual tonsils on the left. Correlate clinically and consider direct visualization as warranted. Electronically Signed: By: Kellie Simmering On: 03/22/2019 08:31   MM 3D SCREEN BREAST BILATERAL  Result Date: 03/22/2019 CLINICAL DATA:  Screening. EXAM: DIGITAL SCREENING BILATERAL MAMMOGRAM WITH TOMO AND CAD COMPARISON:  Previous exam(s). ACR Breast Density Category b: There are scattered areas of fibroglandular density. FINDINGS: There are no findings suspicious for malignancy. Images were processed with CAD. IMPRESSION: No mammographic evidence of malignancy. A result letter of this screening mammogram will be mailed directly to the patient. RECOMMENDATION: Screening mammogram in one year. (Code:SM-B-01Y) BI-RADS CATEGORY  1: Negative. please Insert Correct Screening Template Electronically Signed   By: Abelardo Diesel M.D.   On: 03/22/2019 08:55    Assessment & Plan:   Azya was seen today for hypertension.  Diagnoses and all orders for this visit:  Essential hypertension- Her BP is not well controlled. She agrees to improve her lifestyle modifications. Will change the loop diuretic to a thiazide diuretic. -     indapamide (LOZOL) 1.25 MG tablet; Take 1 tablet (1.25 mg total) by mouth daily.  Reaction, adjustment, with anxious mood- She is not willing to start an antidepressant at this time. Will treat with a benzo short term. -     clonazePAM (KLONOPIN) 0.5 MG tablet; Take 1 tablet (0.5 mg total) by mouth 2 (two) times daily as needed for anxiety.  Need for influenza vaccination -     Flu Vaccine QUAD 36+ mos IM  Need for Tdap vaccination -     Tdap vaccine greater than or equal to 7yo IM   I have discontinued Merriel Y. Malan's albuterol, Eucrisa, amoxicillin, naproxen sodium, and traMADol. I am also having her start on  indapamide and clonazePAM. Additionally, I am having her maintain her potassium chloride SA, furosemide, doxepin, olmesartan, cimetidine, clobetasol cream, hydrOXYzine, and omeprazole.  Meds ordered this encounter  Medications  . indapamide (LOZOL) 1.25 MG tablet    Sig: Take 1 tablet (1.25 mg total) by mouth daily.    Dispense:  90 tablet    Refill:  0  . clonazePAM (KLONOPIN) 0.5 MG tablet    Sig: Take 1 tablet (0.5 mg total) by mouth 2 (two) times daily as needed for anxiety.    Dispense:  60 tablet    Refill:  1     Follow-up: Return in about 4 weeks (around 09/24/2019).  [name], MD

## 2019-08-27 NOTE — Patient Instructions (Signed)

## 2019-09-23 ENCOUNTER — Other Ambulatory Visit: Payer: Self-pay | Admitting: Internal Medicine

## 2019-09-24 ENCOUNTER — Other Ambulatory Visit: Payer: Self-pay

## 2019-09-24 ENCOUNTER — Encounter: Payer: Self-pay | Admitting: Internal Medicine

## 2019-09-24 ENCOUNTER — Ambulatory Visit: Payer: 59 | Admitting: Internal Medicine

## 2019-09-24 ENCOUNTER — Ambulatory Visit (INDEPENDENT_AMBULATORY_CARE_PROVIDER_SITE_OTHER): Payer: 59

## 2019-09-24 VITALS — BP 142/88 | HR 82 | Temp 98.7°F | Ht 67.0 in | Wt 308.2 lb

## 2019-09-24 DIAGNOSIS — R05 Cough: Secondary | ICD-10-CM | POA: Diagnosis not present

## 2019-09-24 DIAGNOSIS — E876 Hypokalemia: Secondary | ICD-10-CM

## 2019-09-24 DIAGNOSIS — D509 Iron deficiency anemia, unspecified: Secondary | ICD-10-CM

## 2019-09-24 DIAGNOSIS — I10 Essential (primary) hypertension: Secondary | ICD-10-CM

## 2019-09-24 DIAGNOSIS — T502X5A Adverse effect of carbonic-anhydrase inhibitors, benzothiadiazides and other diuretics, initial encounter: Secondary | ICD-10-CM

## 2019-09-24 DIAGNOSIS — E559 Vitamin D deficiency, unspecified: Secondary | ICD-10-CM

## 2019-09-24 DIAGNOSIS — R7303 Prediabetes: Secondary | ICD-10-CM

## 2019-09-24 DIAGNOSIS — R059 Cough, unspecified: Secondary | ICD-10-CM

## 2019-09-24 DIAGNOSIS — R051 Acute cough: Secondary | ICD-10-CM | POA: Insufficient documentation

## 2019-09-24 LAB — POCT EXHALED NITRIC OXIDE: FeNO level (ppb): 13

## 2019-09-24 NOTE — Progress Notes (Signed)
Subjective:  Patient ID: Jessica Davenport, female    DOB: 05-14-1966  Age: 54 y.o. MRN: [id number]  CC: Hypertension, Anemia, and Cough  This visit occurred during the SARS-CoV-2 public health emergency.  Safety protocols were in place, including screening questions prior to the visit, additional usage of staff PPE, and extensive cleaning of exam room while observing appropriate contact time as indicated for disinfecting solutions.    HPI Jessica Davenport presents for f/up - She complains of a 5-day history of nonproductive cough with chills and shortness of breath.  She denies fever, chest pain, hemoptysis, rash, myalgias, or arthralgias.  Outpatient Medications Prior to Visit  Medication Sig Dispense Refill  . cimetidine (TAGAMET) 400 MG tablet     . clobetasol cream (TEMOVATE) [id number] % Apply 1 application topically 2 (two) times daily.    . clonazePAM (KLONOPIN) 0.5 MG tablet Take 1 tablet (0.5 mg total) by mouth 2 (two) times daily as needed for anxiety. 60 tablet 1  . furosemide (LASIX) 40 MG tablet Take 1 tablet (40 mg total) by mouth daily. 90 tablet 3  . hydrOXYzine (VISTARIL) 25 MG capsule Take 25 mg by mouth 2 (two) times daily.    . indapamide (LOZOL) 1.25 MG tablet Take 1 tablet (1.25 mg total) by mouth daily. 90 tablet 0  . olmesartan (BENICAR) 20 MG tablet TAKE ONE TABLET BY MOUTH DAILY 30 tablet 0  . omeprazole (PRILOSEC) 20 MG capsule Take by mouth.    . doxepin (SINEQUAN) 10 MG capsule Take 1 capsule (10 mg total) by mouth at bedtime. 90 capsule 1  . potassium chloride SA (K-DUR,KLOR-CON) 20 MEQ tablet Take 1 tablet (20 mEq total) by mouth daily. 90 tablet 3  . albuterol (VENTOLIN HFA) 108 (90 Base) MCG/ACT inhaler     . benzonatate (TESSALON) 100 MG capsule     . EPINEPHrine 0.3 mg/0.3 mL IJ SOAJ injection      No facility-administered medications prior to visit.    ROS Review of Systems  Constitutional: Positive for chills. Negative for diaphoresis, fatigue and  fever.  HENT: Negative.  Negative for sore throat, trouble swallowing and voice change.   Eyes: Negative.   Respiratory: Positive for cough and shortness of breath. Negative for chest tightness and wheezing.   Cardiovascular: Negative for chest pain, palpitations and leg swelling.  Gastrointestinal: Negative for abdominal pain, constipation, diarrhea, nausea and vomiting.  Genitourinary: Negative for difficulty urinating, dysuria and hematuria.  Musculoskeletal: Negative.  Negative for arthralgias and myalgias.  Skin: Negative.  Negative for color change and pallor.  Neurological: Negative for dizziness, weakness and light-headedness.  Hematological: Negative for adenopathy. Does not bruise/bleed easily.  Psychiatric/Behavioral: Negative.     Objective:  BP (!) 142/88 (BP Location: Right Arm, Patient Position: Sitting, Cuff Size: Large)   Pulse 82   Temp 98.7 F (37.1 C) (Oral)   Ht 5\' 7"  (1.702 m)   Wt (!) 308 lb 4 oz (139.8 kg)   LMP 11/10/2011   SpO2 97%   BMI 48.28 kg/m   BP Readings from Last 3 Encounters:  09/24/19 (!) 142/88  08/27/19 (!) 160/92  02/08/19 132/78    Wt Readings from Last 3 Encounters:  09/24/19 (!) 308 lb 4 oz (139.8 kg)  08/27/19 (!) 315 lb 8 oz (143.1 kg)  02/08/19 (!) 311 lb (141.1 kg)    Physical Exam Vitals reviewed.  Constitutional:      Appearance: She is obese.  HENT:     Nose:  Nose normal.     Mouth/Throat:     Mouth: Mucous membranes are moist.  Eyes:     General: No scleral icterus.    Conjunctiva/sclera: Conjunctivae normal.  Cardiovascular:     Rate and Rhythm: Normal rate and regular rhythm.     Heart sounds: No murmur.  Pulmonary:     Effort: Pulmonary effort is normal. No respiratory distress.     Breath sounds: No stridor. No wheezing, rhonchi or rales.  Abdominal:     General: Abdomen is protuberant. Bowel sounds are normal. There is no distension.     Palpations: Abdomen is soft. There is no hepatomegaly, splenomegaly  or mass.     Tenderness: There is no abdominal tenderness.  Musculoskeletal:        General: Normal range of motion.     Cervical back: Neck supple.     Right lower leg: No edema.     Left lower leg: No edema.  Lymphadenopathy:     Cervical: No cervical adenopathy.  Skin:    General: Skin is warm and dry.     Coloration: Skin is not pale.     Findings: No rash.  Neurological:     General: No focal deficit present.     Mental Status: She is alert.  Psychiatric:        Mood and Affect: Mood normal.        Behavior: Behavior normal.     Lab Results  Component Value Date   WBC 6.2 09/24/2019   HGB 12.2 09/24/2019   HCT 37.4 09/24/2019   PLT 321.0 09/24/2019   GLUCOSE 90 09/24/2019   CHOL 206 (H) 02/08/2019   TRIG 116.0 02/08/2019   HDL 52.50 02/08/2019   LDLDIRECT 148.1 06/16/2012   LDLCALC 130 (H) 02/08/2019   ALT 9 02/08/2019   AST 10 02/08/2019   NA 140 09/24/2019   K 3.4 (L) 09/24/2019   CL 103 09/24/2019   CREATININE 0.80 09/24/2019   BUN 7 09/24/2019   CO2 29 09/24/2019   TSH 1.62 02/08/2019   HGBA1C 6.4 09/24/2019    CT SOFT TISSUE NECK W CONTRAST  Addendum Date: 03/28/2019   ADDENDUM REPORT: 03/28/2019 12:55 ADDENDUM: The 1.5 cm predominantly fat density subcutaneous lesion within the mid right neck may reflect a lipoma as initially described. However, given the nonspecific 4 mm peripheral nodular soft tissue component, clinical follow-up is recommended. Repeat contrast-enhanced CT imaging is recommended if this palpable abnormality is enlarging at time of clinical follow-up. This addendum and recommendations will be called to the ordering clinician or representative by the Radiologist Assistant, and communication documented in the PACS or zVision Dashboard. Electronically Signed   By: Kellie Simmering   On: 03/28/2019 12:55   Result Date: 03/28/2019 CLINICAL DATA:  Branchial cleft cyst EXAM: CT NECK WITH CONTRAST TECHNIQUE: Multidetector CT imaging of the neck was  performed using the standard protocol following the bolus administration of intravenous contrast. CONTRAST:  7mL ISOVUE-300 IOPAMIDOL (ISOVUE-300) INJECTION 61% COMPARISON:  No pertinent prior studies available for comparison. FINDINGS: Pharynx and larynx: Several punctate calcifications within the palatine tonsils. Nonspecific asymmetric prominence of the lingual tonsils on the left. The pharynx and larynx are otherwise unremarkable. Salivary glands: No inflammation, mass, or stone. Thyroid: Unremarkable Lymph nodes: No pathologically enlarged cervical chain lymph nodes Vascular: The major vascular structures of the neck are patent. Limited intracranial: Partially empty sella turcica. Otherwise unremarkable. Visualized orbits: No acute abnormality Mastoids and visualized paranasal sinuses: No significant  paranasal sinus disease or mastoid effusion. Skeleton: No suspicious lytic or blastic osseous lesions Upper chest: No confluent consolidation within the imaged lung apices. Other: Within the subcutaneous soft tissues of the mid right neck, immediately beneath a skin surface marker, there is a well-circumscribed 1.5 x 1.1 cm round predominantly fat density lesion (series 2, image 53) (series 10, image 57). There is a 4 mm eccentric soft tissue component. 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 Overlying this lesion and immediately adjacent to the skin surface marker there is a nonspecific 0.7 x 0.4 cm focus of cutaneous/subcutaneous induration (series 2, image 53). IMPRESSION: Within the mid right neck immediately beneath a skin surface marker, there is a 1.5 x 1.1 cm predominantly fat density subcutaneous lesion with small eccentric soft tissue component. 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 Overlying this lesion, there is a nonspecific 0.7 cm focus of cutaneous/subcutaneous induration. Correlate with direct visualization. Nonspecific prominence of the lingual tonsils on the left. Correlate clinically and  consider direct visualization as warranted. Electronically Signed: By: Kellie Simmering On: 03/22/2019 08:31   MM 3D SCREEN BREAST BILATERAL  Result Date: 03/22/2019 CLINICAL DATA:  Screening. EXAM: DIGITAL SCREENING BILATERAL MAMMOGRAM WITH TOMO AND CAD COMPARISON:  Previous exam(s). ACR Breast Density Category b: There are scattered areas of fibroglandular density. FINDINGS: There are no findings suspicious for malignancy. Images were processed with CAD. IMPRESSION: No mammographic evidence of malignancy. A result letter of this screening mammogram will be mailed directly to the patient. RECOMMENDATION: Screening mammogram in one year. (Code:SM-B-01Y) BI-RADS CATEGORY  1: Negative. please Insert Correct Screening Template Electronically Signed   By: Abelardo Diesel M.D.   On: 03/22/2019 08:55   DG Chest 2 View  Result Date: 09/25/2019 CLINICAL DATA:  Cough and wheezing 5 days. Progressive symptoms. EXAM: CHEST - 2 VIEW COMPARISON:  Two-view chest x-ray 01/03/2019 FINDINGS: Heart size is normal. Mild interstitial coarsening is chronic. No focal airspace consolidation is present. No edema or effusions are present. IMPRESSION: No acute cardiopulmonary disease or significant interval change. Electronically Signed   By: San Morelle M.D.   On: 09/25/2019 08:48    Assessment & Plan:   Jessica Davenport was seen today for hypertension, anemia and cough.  Diagnoses and all orders for this visit:  Prediabetes- Her A1c is up to 6.4%.  Medical therapy is not quite indicated.  She was encouraged to improve her lifestyle modifications. -     Basic metabolic panel -     Hemoglobin A1c  Vitamin D deficiency -     VITAMIN D 25 Hydroxy (Vit-D Deficiency, Fractures) -     Cholecalciferol 1.25 MG (50000 UT) capsule; Take 1 capsule (50,000 Units total) by mouth once a week.  Iron deficiency anemia, unspecified iron deficiency anemia type- Her H&H are normal now. -     CBC with Differential/Platelet  Essential  hypertension- Her blood pressure is not quite adequately well controlled.  Will continue the current antihypertensive regimen.  She has developed hypokalemia from the thiazide diuretic.  Will start a potassium supplement.  I have encouraged her to improve her lifestyle modifications. -     Basic metabolic panel  Cough- Her FeNO score is normal so I do not think she has an allergic condition like asthma that would require treatment with steroids.  Her chest x-ray is negative for mass or infiltrate.  She likely has a viral URI.  She is getting adequate symptom relief with Robitussin-DM.  Will continue that. -  DG Chest 2 View; Future -     POCT EXHALED NITRIC OXIDE  Diuretic-induced hypokalemia -     potassium chloride SA (KLOR-CON) 20 MEQ tablet; Take 1 tablet (20 mEq total) by mouth 2 (two) times daily.   I have discontinued Amarachi Y. Bear's doxepin. I have also changed her potassium chloride SA. Additionally, I am having her start on Cholecalciferol. Lastly, I am having her maintain her furosemide, cimetidine, clobetasol cream, hydrOXYzine, omeprazole, indapamide, clonazePAM, olmesartan, albuterol, EPINEPHrine, and benzonatate.  Meds ordered this encounter  Medications  . potassium chloride SA (KLOR-CON) 20 MEQ tablet    Sig: Take 1 tablet (20 mEq total) by mouth 2 (two) times daily.    Dispense:  180 tablet    Refill:  1  . Cholecalciferol 1.25 MG (50000 UT) capsule    Sig: Take 1 capsule (50,000 Units total) by mouth once a week.    Dispense:  12 capsule    Refill:  0     Follow-up: Return in about 3 weeks (around 10/15/2019).  [name], MD

## 2019-09-24 NOTE — Patient Instructions (Signed)

## 2019-09-25 ENCOUNTER — Encounter: Payer: Self-pay | Admitting: Internal Medicine

## 2019-09-25 LAB — HEMOGLOBIN A1C: Hgb A1c MFr Bld: 6.4 % (ref 4.6–6.5)

## 2019-09-25 LAB — CBC WITH DIFFERENTIAL/PLATELET
Basophils Absolute: 0.1 10*3/uL (ref 0.0–0.1)
Basophils Relative: 1.6 % (ref 0.0–3.0)
Eosinophils Absolute: 0.1 10*3/uL (ref 0.0–0.7)
Eosinophils Relative: 2.4 % (ref 0.0–5.0)
HCT: 37.4 % (ref 36.0–46.0)
Hemoglobin: 12.2 g/dL (ref 12.0–15.0)
Lymphocytes Relative: 33.5 % (ref 12.0–46.0)
Lymphs Abs: 2.1 10*3/uL (ref 0.7–4.0)
MCHC: 32.7 g/dL (ref 30.0–36.0)
MCV: 83.3 fl (ref 78.0–100.0)
Monocytes Absolute: 0.3 10*3/uL (ref 0.1–1.0)
Monocytes Relative: 5 % (ref 3.0–12.0)
Neutro Abs: 3.5 10*3/uL (ref 1.4–7.7)
Neutrophils Relative %: 57.5 % (ref 43.0–77.0)
Platelets: 321 10*3/uL (ref 150.0–400.0)
RBC: 4.48 Mil/uL (ref 3.87–5.11)
RDW: 14.7 % (ref 11.5–15.5)
WBC: 6.2 10*3/uL (ref 4.0–10.5)

## 2019-09-25 LAB — BASIC METABOLIC PANEL
BUN: 7 mg/dL (ref 6–23)
CO2: 29 mEq/L (ref 19–32)
Calcium: 9.1 mg/dL (ref 8.4–10.5)
Chloride: 103 mEq/L (ref 96–112)
Creatinine, Ser: 0.8 mg/dL (ref 0.40–1.20)
GFR: 91.33 mL/min (ref >60.00)
Glucose, Bld: 90 mg/dL (ref 70–99)
Potassium: 3.4 mEq/L — ABNORMAL LOW (ref 3.5–5.1)
Sodium: 140 mEq/L (ref 135–145)

## 2019-09-25 LAB — VITAMIN D 25 HYDROXY (VIT D DEFICIENCY, FRACTURES): VITD: 17.18 ng/mL — ABNORMAL LOW (ref 30.00–100.00)

## 2019-09-25 MED ORDER — CHOLECALCIFEROL 1.25 MG (50000 UT) PO CAPS: 50000.0000 [IU] | ORAL_CAPSULE | ORAL | 0 refills | Status: DC

## 2019-09-25 MED ORDER — POTASSIUM CHLORIDE CRYS ER 20 MEQ PO TBCR: 20.0000 meq | EXTENDED_RELEASE_TABLET | Freq: Two times a day (BID) | ORAL | 1 refills | Status: DC

## 2020-01-09 ENCOUNTER — Other Ambulatory Visit: Payer: Self-pay | Admitting: Internal Medicine

## 2020-01-09 ENCOUNTER — Telehealth: Payer: Self-pay | Admitting: Internal Medicine

## 2020-01-09 DIAGNOSIS — F411 Generalized anxiety disorder: Secondary | ICD-10-CM | POA: Insufficient documentation

## 2020-01-09 MED ORDER — TRAZODONE HCL 100 MG PO TABS: 100.0000 mg | ORAL_TABLET | Freq: Every day | ORAL | 0 refills | Status: DC

## 2020-01-09 MED ORDER — CLONAZEPAM 1 MG PO TABS: 1.0000 mg | ORAL_TABLET | Freq: Two times a day (BID) | ORAL | 1 refills | Status: DC | PRN

## 2020-01-09 NOTE — Telephone Encounter (Signed)
New message:    Pt is calling and she is very overwhelmed with some things that is going on with her family right now. Pt would like to know if she can be prescribed something stronger than clonazePAM (KLONOPIN) 0.5 MG tablet. She states she is taking this medication but it's not helping her cope at this moment.   [place] [address], [place]

## 2020-01-09 NOTE — Telephone Encounter (Signed)
Can schedule pt if needed.

## 2020-01-10 NOTE — Telephone Encounter (Signed)
Pt contacted and informed that rx has been sent.   Pt has been scheduled for 01/15/2020 at 1:20pm.

## 2020-01-13 ENCOUNTER — Other Ambulatory Visit: Payer: Self-pay | Admitting: Internal Medicine

## 2020-01-13 DIAGNOSIS — I10 Essential (primary) hypertension: Secondary | ICD-10-CM

## 2020-01-15 ENCOUNTER — Ambulatory Visit: Payer: 59 | Admitting: Internal Medicine

## 2020-01-15 ENCOUNTER — Encounter: Payer: Self-pay | Admitting: Internal Medicine

## 2020-01-15 ENCOUNTER — Ambulatory Visit (INDEPENDENT_AMBULATORY_CARE_PROVIDER_SITE_OTHER): Payer: 59

## 2020-01-15 ENCOUNTER — Other Ambulatory Visit: Payer: Self-pay

## 2020-01-15 VITALS — BP 130/80 | HR 83 | Temp 98.2°F | Resp 16 | Ht 67.0 in | Wt 301.4 lb

## 2020-01-15 DIAGNOSIS — R7303 Prediabetes: Secondary | ICD-10-CM | POA: Diagnosis not present

## 2020-01-15 DIAGNOSIS — R1310 Dysphagia, unspecified: Secondary | ICD-10-CM | POA: Diagnosis not present

## 2020-01-15 DIAGNOSIS — I10 Essential (primary) hypertension: Secondary | ICD-10-CM

## 2020-01-15 DIAGNOSIS — R10826 Epigastric rebound abdominal tenderness: Secondary | ICD-10-CM

## 2020-01-15 DIAGNOSIS — D72829 Elevated white blood cell count, unspecified: Secondary | ICD-10-CM

## 2020-01-15 DIAGNOSIS — R1319 Other dysphagia: Secondary | ICD-10-CM | POA: Insufficient documentation

## 2020-01-15 LAB — URINALYSIS, ROUTINE W REFLEX MICROSCOPIC
Hgb urine dipstick: NEGATIVE
Leukocytes,Ua: NEGATIVE
Nitrite: NEGATIVE
RBC / HPF: NONE SEEN (ref 0–?)
Specific Gravity, Urine: 1.025 (ref 1.000–1.030)
Total Protein, Urine: NEGATIVE
Urine Glucose: NEGATIVE
Urobilinogen, UA: 1 (ref 0.0–1.0)
pH: 6 (ref 5.0–8.0)

## 2020-01-15 LAB — CBC WITH DIFFERENTIAL/PLATELET
Basophils Absolute: 0.1 10*3/uL (ref 0.0–0.1)
Basophils Relative: 0.6 % (ref 0.0–3.0)
Eosinophils Absolute: 0.1 10*3/uL (ref 0.0–0.7)
Eosinophils Relative: 1.2 % (ref 0.0–5.0)
HCT: 39 % (ref 36.0–46.0)
Hemoglobin: 12.7 g/dL (ref 12.0–15.0)
Lymphocytes Relative: 34.7 % (ref 12.0–46.0)
Lymphs Abs: 3.9 10*3/uL (ref 0.7–4.0)
MCHC: 32.7 g/dL (ref 30.0–36.0)
MCV: 84.8 fl (ref 78.0–100.0)
Monocytes Absolute: 0.5 10*3/uL (ref 0.1–1.0)
Monocytes Relative: 4.6 % (ref 3.0–12.0)
Neutro Abs: 6.6 10*3/uL (ref 1.4–7.7)
Neutrophils Relative %: 58.9 % (ref 43.0–77.0)
Platelets: 341 10*3/uL (ref 150.0–400.0)
RBC: 4.6 Mil/uL (ref 3.87–5.11)
RDW: 14.6 % (ref 11.5–15.5)
WBC: 11.2 10*3/uL — ABNORMAL HIGH (ref 4.0–10.5)

## 2020-01-15 LAB — BASIC METABOLIC PANEL
BUN: 13 mg/dL (ref 6–23)
CO2: 32 mEq/L (ref 19–32)
Calcium: 10.6 mg/dL — ABNORMAL HIGH (ref 8.4–10.5)
Chloride: 99 mEq/L (ref 96–112)
Creatinine, Ser: 0.8 mg/dL (ref 0.40–1.20)
GFR: 91.22 mL/min (ref >60.00)
Glucose, Bld: 104 mg/dL — ABNORMAL HIGH (ref 70–99)
Potassium: 3.3 mEq/L — ABNORMAL LOW (ref 3.5–5.1)
Sodium: 140 mEq/L (ref 135–145)

## 2020-01-15 LAB — LIPASE: Lipase: 26 U/L (ref 11.0–59.0)

## 2020-01-15 LAB — HEPATIC FUNCTION PANEL
ALT: 15 U/L (ref 0–35)
AST: 14 U/L (ref 0–37)
Albumin: 4.2 g/dL (ref 3.5–5.2)
Alkaline Phosphatase: 72 U/L (ref 39–117)
Bilirubin, Direct: 0.1 mg/dL (ref 0.0–0.3)
Total Bilirubin: 0.3 mg/dL (ref 0.2–1.2)
Total Protein: 8.2 g/dL (ref 6.0–8.3)

## 2020-01-15 LAB — AMYLASE: Amylase: 31 U/L (ref 27–131)

## 2020-01-15 NOTE — Patient Instructions (Signed)
Abdominal Pain, Adult Pain in the abdomen (abdominal pain) can be caused by many things. Often, abdominal pain is not serious and it gets better with no treatment or by being treated at home. However, sometimes abdominal pain is serious. Your health care provider will ask questions about your medical history and do a physical exam to try to determine the cause of your abdominal pain. Follow these instructions at home:  Medicines  Take over-the-counter and prescription medicines only as told by your health care provider.  Do not take a laxative unless told by your health care provider. General instructions  Watch your condition for any changes.  Drink enough fluid to keep your urine pale yellow.  Keep all follow-up visits as told by your health care provider. This is important. Contact a health care provider if:  Your abdominal pain changes or gets worse.  You are not hungry or you lose weight without trying.  You are constipated or have diarrhea for more than 2-3 days.  You have pain when you urinate or have a bowel movement.  Your abdominal pain wakes you up at night.  Your pain gets worse with meals, after eating, or with certain foods.  You are vomiting and cannot keep anything down.  You have a fever.  You have blood in your urine. Get help right away if:  Your pain does not go away as soon as your health care provider told you to expect.  You cannot stop vomiting.  Your pain is only in areas of the abdomen, such as the right side or the left lower portion of the abdomen. Pain on the right side could be caused by appendicitis.  You have bloody or black stools, or stools that look like tar.  You have severe pain, cramping, or bloating in your abdomen.  You have signs of dehydration, such as: ? Dark urine, very little urine, or no urine. ? Cracked lips. ? Dry mouth. ? Sunken eyes. ? Sleepiness. ? Weakness.  You have trouble breathing or chest  pain. Summary  Often, abdominal pain is not serious and it gets better with no treatment or by being treated at home. However, sometimes abdominal pain is serious.  Watch your condition for any changes.  Take over-the-counter and prescription medicines only as told by your health care provider.  Contact a health care provider if your abdominal pain changes or gets worse.  Get help right away if you have severe pain, cramping, or bloating in your abdomen. This information is not intended to replace advice given to you by your health care provider. Make sure you discuss any questions you have with your health care provider. Document Revised: 11/13/2018 Document Reviewed: 11/13/2018 Elsevier Patient Education  2020 Elsevier Inc.  

## 2020-01-15 NOTE — Progress Notes (Signed)
Subjective:  Patient ID: Jessica Davenport, female    DOB: 03-18-1966  Age: 54 y.o. MRN: [id number]  CC: Hypertension and Abdominal Pain  This visit occurred during the SARS-CoV-2 public health emergency.  Safety protocols were in place, including screening questions prior to the visit, additional usage of staff PPE, and extensive cleaning of exam room while observing appropriate contact time as indicated for disinfecting solutions.    HPI Glora ZEINA AKKERMAN presents for the complaint of a 1 day history of right upper quadrant abdominal pain that she describes as a cramping and a twisting sensation.  There is also some discomfort in the epigastric region.  3 days prior to this she had an episode of odynophagia and dysphagia that resolved after a dose of Tums.  She has had mild nausea, LOA, and chills but she denies vomiting, fever, dysuria, hematuria, diarrhea, constipation, melena, or blood in her stool.  Outpatient Medications Prior to Visit  Medication Sig Dispense Refill  . albuterol (VENTOLIN HFA) 108 (90 Base) MCG/ACT inhaler     . Cholecalciferol 1.25 MG (50000 UT) capsule Take 1 capsule (50,000 Units total) by mouth once a week. 12 capsule 0  . cimetidine (TAGAMET) 400 MG tablet     . clobetasol cream (TEMOVATE) 6.31 % Apply 1 application topically 2 (two) times daily.    . clonazePAM (KLONOPIN) 1 MG tablet Take 1 tablet (1 mg total) by mouth 2 (two) times daily as needed for anxiety. 60 tablet 1  . EPINEPHrine 0.3 mg/0.3 mL IJ SOAJ injection     . furosemide (LASIX) 40 MG tablet Take 1 tablet (40 mg total) by mouth daily. 90 tablet 3  . hydrOXYzine (VISTARIL) 25 MG capsule Take 25 mg by mouth 2 (two) times daily.    Marland Kitchen olmesartan (BENICAR) 20 MG tablet TAKE ONE TABLET BY MOUTH DAILY 30 tablet 0  . omeprazole (PRILOSEC) 20 MG capsule Take by mouth.    . potassium chloride SA (KLOR-CON) 20 MEQ tablet Take 1 tablet (20 mEq total) by mouth 2 (two) times daily. 180 tablet 1  . traZODone  (DESYREL) 100 MG tablet Take 1 tablet (100 mg total) by mouth at bedtime. 90 tablet 0  . indapamide (LOZOL) 1.25 MG tablet TAKE ONE TABLET BY MOUTH DAILY 90 tablet 0   No facility-administered medications prior to visit.    ROS Review of Systems  Constitutional: Positive for chills. Negative for diaphoresis, fatigue and unexpected weight change.  HENT: Positive for trouble swallowing. Negative for sore throat and voice change.   Eyes: Negative.   Respiratory: Negative for cough, chest tightness, shortness of breath and wheezing.   Cardiovascular: Negative for chest pain, palpitations and leg swelling.  Gastrointestinal: Positive for abdominal pain and nausea. Negative for blood in stool, constipation, diarrhea and vomiting.  Endocrine: Negative.   Genitourinary: Negative.  Negative for decreased urine volume, difficulty urinating, dysuria, hematuria and urgency.  Musculoskeletal: Negative.   Skin: Negative.  Negative for color change, pallor and rash.  Neurological: Negative.  Negative for dizziness, weakness, light-headedness and numbness.  Hematological: Negative for adenopathy. Does not bruise/bleed easily.  Psychiatric/Behavioral: Negative.     Objective:  BP 130/80 (BP Location: Left Arm, Patient Position: Sitting, Cuff Size: Large)   Pulse 83   Temp 98.2 F (36.8 C) (Oral)   Resp 16   Ht 5\' 7"  (1.702 m)   Wt (!) 301 lb 6 oz (136.7 kg)   LMP 11/10/2011   SpO2 98%   BMI 47.20  kg/m   BP Readings from Last 3 Encounters:  01/15/20 130/80  09/24/19 (!) 142/88  08/27/19 (!) 160/92    Wt Readings from Last 3 Encounters:  01/15/20 (!) 301 lb 6 oz (136.7 kg)  09/24/19 (!) 308 lb 4 oz (139.8 kg)  08/27/19 (!) 315 lb 8 oz (143.1 kg)    Physical Exam Vitals reviewed.  Constitutional:      General: She is not in acute distress.    Appearance: She is obese. She is not ill-appearing, toxic-appearing or diaphoretic.  HENT:     Nose: Nose normal.     Mouth/Throat:      Mouth: Mucous membranes are moist.  Eyes:     General: No scleral icterus.    Conjunctiva/sclera: Conjunctivae normal.  Cardiovascular:     Rate and Rhythm: Normal rate and regular rhythm.     Heart sounds: No murmur heard.   Pulmonary:     Effort: Pulmonary effort is normal.     Breath sounds: No stridor. No wheezing, rhonchi or rales.  Abdominal:     General: Abdomen is protuberant. Bowel sounds are decreased. There is no distension.     Palpations: Abdomen is soft. There is no hepatomegaly, splenomegaly or mass.     Tenderness: There is abdominal tenderness in the right upper quadrant and epigastric area. There is guarding. There is no right CVA tenderness, left CVA tenderness or rebound. Negative signs include Murphy's sign.     Hernia: No hernia is present.  Musculoskeletal:        General: Normal range of motion.     Cervical back: Neck supple.     Right lower leg: No edema.     Left lower leg: No edema.  Lymphadenopathy:     Cervical: No cervical adenopathy.  Skin:    General: Skin is warm and dry.  Neurological:     General: No focal deficit present.     Mental Status: She is alert.  Psychiatric:        Mood and Affect: Mood normal.        Behavior: Behavior normal.     Lab Results  Component Value Date   WBC 11.2 (H) 01/15/2020   HGB 12.7 01/15/2020   HCT 39.0 01/15/2020   PLT 341.0 01/15/2020   GLUCOSE 104 (H) 01/15/2020   CHOL 206 (H) 02/08/2019   TRIG 116.0 02/08/2019   HDL 52.50 02/08/2019   LDLDIRECT 148.1 06/16/2012   LDLCALC 130 (H) 02/08/2019   ALT 15 01/15/2020   AST 14 01/15/2020   NA 140 01/15/2020   K 3.3 (L) 01/15/2020   CL 99 01/15/2020   CREATININE 0.80 01/15/2020   BUN 13 01/15/2020   CO2 32 01/15/2020   TSH 1.62 02/08/2019   HGBA1C 6.3 01/15/2020    CT SOFT TISSUE NECK W CONTRAST  Addendum Date: 03/28/2019   ADDENDUM REPORT: 03/28/2019 12:55 ADDENDUM: The 1.5 cm predominantly fat density subcutaneous lesion within the mid right neck  may reflect a lipoma as initially described. However, given the nonspecific 4 mm peripheral nodular soft tissue component, clinical follow-up is recommended. Repeat contrast-enhanced CT imaging is recommended if this palpable abnormality is enlarging at time of clinical follow-up. This addendum and recommendations will be called to the ordering clinician or representative by the Radiologist Assistant, and communication documented in the PACS or zVision Dashboard. Electronically Signed   By: Kellie Simmering   On: 03/28/2019 12:55   Result Date: 03/28/2019 CLINICAL DATA:  Branchial cleft cyst  EXAM: CT NECK WITH CONTRAST TECHNIQUE: Multidetector CT imaging of the neck was performed using the standard protocol following the bolus administration of intravenous contrast. CONTRAST:  61mL ISOVUE-300 IOPAMIDOL (ISOVUE-300) INJECTION 61% COMPARISON:  No pertinent prior studies available for comparison. FINDINGS: Pharynx and larynx: Several punctate calcifications within the palatine tonsils. Nonspecific asymmetric prominence of the lingual tonsils on the left. The pharynx and larynx are otherwise unremarkable. Salivary glands: No inflammation, mass, or stone. Thyroid: Unremarkable Lymph nodes: No pathologically enlarged cervical chain lymph nodes Vascular: The major vascular structures of the neck are patent. Limited intracranial: Partially empty sella turcica. Otherwise unremarkable. Visualized orbits: No acute abnormality Mastoids and visualized paranasal sinuses: No significant paranasal sinus disease or mastoid effusion. Skeleton: No suspicious lytic or blastic osseous lesions Upper chest: No confluent consolidation within the imaged lung apices. Other: Within the subcutaneous soft tissues of the mid right neck, immediately beneath a skin surface marker, there is a well-circumscribed 1.5 x 1.1 cm round predominantly fat density lesion (series 2, image 53) (series 10, image 57). There is a 4 mm eccentric soft tissue  component. 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 Overlying this lesion and immediately adjacent to the skin surface marker there is a nonspecific 0.7 x 0.4 cm focus of cutaneous/subcutaneous induration (series 2, image 53). IMPRESSION: Within the mid right neck immediately beneath a skin surface marker, there is a 1.5 x 1.1 cm predominantly fat density subcutaneous lesion with small eccentric soft tissue component. 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 Overlying this lesion, there is a nonspecific 0.7 cm focus of cutaneous/subcutaneous induration. Correlate with direct visualization. Nonspecific prominence of the lingual tonsils on the left. Correlate clinically and consider direct visualization as warranted. Electronically Signed: By: Kellie Simmering On: 03/22/2019 08:31   MM 3D SCREEN BREAST BILATERAL  Result Date: 03/22/2019 CLINICAL DATA:  Screening. EXAM: DIGITAL SCREENING BILATERAL MAMMOGRAM WITH TOMO AND CAD COMPARISON:  Previous exam(s). ACR Breast Density Category b: There are scattered areas of fibroglandular density. FINDINGS: There are no findings suspicious for malignancy. Images were processed with CAD. IMPRESSION: No mammographic evidence of malignancy. A result letter of this screening mammogram will be mailed directly to the patient. RECOMMENDATION: Screening mammogram in one year. (Code:SM-B-01Y) BI-RADS CATEGORY  1: Negative. please Insert Correct Screening Template Electronically Signed   By: Abelardo Diesel M.D.   On: 03/22/2019 08:55   DG ABD ACUTE 2+V W 1V CHEST  Result Date: 01/15/2020 CLINICAL DATA:  Chest and abdominal pain EXAM: DG ABDOMEN ACUTE W/ 1V CHEST COMPARISON:  Chest radiograph September 24, 2019; abdomen series June 16, 2012. FINDINGS: PA chest: No edema or airspace opacity. Heart size and pulmonary vascularity are normal. No adenopathy. Supine and upright abdomen: There is moderate stool in the colon. There is no bowel dilatation or air-fluid level to suggest bowel  obstruction. No free air. Probable phlebolith in left pelvis. IMPRESSION: No bowel obstruction or free air.  No edema or airspace opacity. Electronically Signed   By: Lowella Grip III M.D.   On: 01/15/2020 14:38    Assessment & Plan:   Maila was seen today for hypertension and abdominal pain.  Diagnoses and all orders for this visit:  Essential hypertension- Her blood pressure is well controlled but shehais developed hypercalcemia and hypokalemia.  I have asked her to stop taking the thiazide diuretic. -     CBC with Differential/Platelet; Future -     Basic metabolic panel; Future -     Urinalysis, Routine w reflex microscopic;  Future -     Urinalysis, Routine w reflex microscopic -     Basic metabolic panel -     CBC with Differential/Platelet  Prediabetes- Her A1c is at 6.3%.  Medical therapy is not indicated. -     Basic metabolic panel; Future -     Hemoglobin A1c; Future -     Hemoglobin A1c -     Basic metabolic panel  Epigastric abdominal tenderness with rebound tenderness- She has symptoms and exam consistent with an acute abdominal process.  Her white cell count is mildly elevated.  I recommended she undergo an ultrasound to screen for cholelithiasis and to undergo a CT scan to see if there is evidence of an abscess or cholecystitis. -     Lipase; Future -     Amylase; Future -     Urinalysis, Routine w reflex microscopic; Future -     DG ABD ACUTE 2+V W 1V CHEST; Future -     Hepatic function panel; Future -     Hepatic function panel -     Urinalysis, Routine w reflex microscopic -     Amylase -     Lipase -     US Abdomen Limited RUQ; Future -     CT Abdomen Pelvis W Contrast; Future  Esophageal dysphagia -     Ambulatory referral to Gastroenterology  Leukocytosis, unspecified type -     CT Abdomen Pelvis W Contrast; Future   I have discontinued [place]'s indapamide. I am also having her maintain her furosemide, cimetidine, clobetasol cream,  hydrOXYzine, omeprazole, olmesartan, albuterol, EPINEPHrine, potassium chloride SA, Cholecalciferol, clonazePAM, and traZODone.  No orders of the defined types were placed in this encounter.  I spent 50 minutes in preparing to see the patient by review of recent labs, imaging and procedures, obtaining and reviewing separately obtained history, communicating with the patient and family or caregiver, ordering medications, tests or procedures, and documenting clinical information in the EHR including the differential Dx, treatment, and any further evaluation and other management of 1. Essential hypertension 2. Prediabetes 3. Epigastric abdominal tenderness with rebound tenderness 4. Esophageal dysphagia 5. Leukocytosis, unspecified type     Follow-up: Return in about 6 weeks (around 02/26/2020).  [name], MD

## 2020-01-16 ENCOUNTER — Encounter: Payer: Self-pay | Admitting: Gastroenterology

## 2020-01-16 ENCOUNTER — Telehealth: Payer: Self-pay | Admitting: Internal Medicine

## 2020-01-16 ENCOUNTER — Encounter: Payer: Self-pay | Admitting: Internal Medicine

## 2020-01-16 DIAGNOSIS — D72829 Elevated white blood cell count, unspecified: Secondary | ICD-10-CM | POA: Insufficient documentation

## 2020-01-16 DIAGNOSIS — Z0279 Encounter for issue of other medical certificate: Secondary | ICD-10-CM

## 2020-01-16 LAB — HEMOGLOBIN A1C: Hgb A1c MFr Bld: 6.3 % (ref 4.6–6.5)

## 2020-01-16 NOTE — Telephone Encounter (Signed)
FMLA forms has been completed for 2-3 x a week. Forms have been faxed to [facility] source [phone] , Copy sent to scan &Charged for.   Patient came by office and has been informed, and picked up original.

## 2020-01-17 ENCOUNTER — Telehealth: Payer: Self-pay | Admitting: Internal Medicine

## 2020-01-17 ENCOUNTER — Other Ambulatory Visit: Payer: Self-pay

## 2020-01-17 ENCOUNTER — Ambulatory Visit (INDEPENDENT_AMBULATORY_CARE_PROVIDER_SITE_OTHER)
Admission: RE | Admit: 2020-01-17 | Discharge: 2020-01-17 | Disposition: A | Discharge status: Home / Self Care | Payer: 59 | Source: Ambulatory Visit | Attending: Internal Medicine | Admitting: Internal Medicine

## 2020-01-17 ENCOUNTER — Telehealth: Payer: Self-pay | Admitting: Interventional Cardiology

## 2020-01-17 DIAGNOSIS — R10826 Epigastric rebound abdominal tenderness: Secondary | ICD-10-CM | POA: Diagnosis not present

## 2020-01-17 DIAGNOSIS — D72829 Elevated white blood cell count, unspecified: Secondary | ICD-10-CM | POA: Diagnosis not present

## 2020-01-17 MED ORDER — IOHEXOL 300 MG/ML  SOLN
100.0000 mL | Freq: Once | INTRAMUSCULAR | Status: AC | PRN
  Administered 2020-01-17: 100 mL via INTRAVENOUS

## 2020-01-17 NOTE — Telephone Encounter (Signed)
New message     Patient want to know if she should keep her 02-04-20 echo bubble appt.?  She had an echo bubble study in august 2020 at [place] to call or send her a Pharmacist, community message.

## 2020-01-17 NOTE — Telephone Encounter (Signed)
Patient calling and asking if she needs to keep her appointment for an echo with bubble study on 02/04/20. She states that she had a bubble study with [facility] in 02/2019. Will forward to Dr. Irish Lack for review and recommendation.  This was originally recommended from prior echo in 01/2019.   Jeanann Lewandowsky, RMA  01/25/2019 10:48 AM EDT     Pt has been made aware of her echo results and she verbalized understanding.   Jeanann Lewandowsky, [place]  01/25/2019 8:19 AM EDT     Left message for pt to call back jw 01/25/2019   Isaiah Serge, NP  01/24/2019 5:31 PM EDT     Let pt know her echo with very tiny leak around device, Dr. Irish Lack and Dr. Burt Knack reviewed and did not think this would cause any problems, Key is to recheck in a year. Limited echo with bubble study with valsalva. Just to be thorough. No reason to do now.   Sherren Mocha, MD  01/24/2019 2:18 PM EDT     Reviewed images. 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 I can't imagine this would be a clinically significant shunt or cause any long-term issue. Worth repeating a limited echo in one year and would do bubble study with Valsalva as well.   Isaiah Serge, NP  01/22/2019 7:18 PM EDT     Dr. Irish Lack - just wanted your opinion on her ASD repair thanks. Mickel Baas

## 2020-01-17 NOTE — Telephone Encounter (Signed)
Patient is calling about her CT results. She is wanting to know if and why her white count is still up if that is what the other test is for. If there is anything she should be doing.  Please follow up with patient. Thank you.

## 2020-01-17 NOTE — Telephone Encounter (Signed)
OK to cancel echo since she had an echo at [facility] in 02/2019.    JV

## 2020-01-18 NOTE — Telephone Encounter (Signed)
    Patient calling, please re-fax forms, fax was cut off

## 2020-01-18 NOTE — Telephone Encounter (Signed)
Called and made patient aware that Dr. Irish Lack said that we can cancel echo since she had one at [facility] in 02/2019.   Patient states that she is not really having any Sx other than being stressed and she becomes SOB when she gets stressed. Patient states that she would like to be closely monitored to be sure not to miss anything. Patient is asking if she should still have regular echo yearly without a bubble study. Made patient aware that I will forward back to Dr. Marta Antu for review.

## 2020-01-22 NOTE — Telephone Encounter (Signed)
Forms have been refaxed  

## 2020-01-23 ENCOUNTER — Encounter: Payer: Self-pay | Admitting: Internal Medicine

## 2020-01-23 ENCOUNTER — Ambulatory Visit
Admission: RE | Admit: 2020-01-23 | Discharge: 2020-01-23 | Disposition: A | Discharge status: Home / Self Care | Payer: 59 | Source: Ambulatory Visit | Attending: Internal Medicine | Admitting: Internal Medicine

## 2020-01-23 DIAGNOSIS — R10826 Epigastric rebound abdominal tenderness: Secondary | ICD-10-CM

## 2020-01-24 NOTE — Telephone Encounter (Signed)
Called and made patient aware. Echo has been cancelled on 7/19. She will keep appointment on 7/27.

## 2020-01-24 NOTE — Telephone Encounter (Signed)
Since she had one in August, not sure that her insurance would cover another echo in July since less than 12 months.  I can see her and we can assess then if we want to repeat echo in August or [place].

## 2020-02-04 ENCOUNTER — Other Ambulatory Visit (HOSPITAL_COMMUNITY): Payer: 59
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
## 2020-02-12 ENCOUNTER — Other Ambulatory Visit: Payer: Self-pay

## 2020-02-12 ENCOUNTER — Ambulatory Visit: Payer: 59 | Admitting: Interventional Cardiology

## 2020-02-12 ENCOUNTER — Encounter: Payer: Self-pay | Admitting: *Deleted

## 2020-02-12 ENCOUNTER — Encounter: Payer: Self-pay | Admitting: Interventional Cardiology

## 2020-02-12 VITALS — BP 136/92 | HR 81 | Ht 67.0 in | Wt 310.0 lb

## 2020-02-12 DIAGNOSIS — R0602 Shortness of breath: Secondary | ICD-10-CM

## 2020-02-12 DIAGNOSIS — Q211 Atrial septal defect, unspecified: Secondary | ICD-10-CM

## 2020-02-12 DIAGNOSIS — I10 Essential (primary) hypertension: Secondary | ICD-10-CM | POA: Diagnosis not present

## 2020-02-12 NOTE — Patient Instructions (Signed)
Medication Instructions:  Your physician recommends that you continue on your current medications as directed. Please refer to the Current Medication list given to you today.  *If you need a refill on your cardiac medications before your next appointment, please call your pharmacy*   Lab Work: None If you have labs (blood work) drawn today and your tests are completely normal, you will receive your results only by: Marland Kitchen MyChart Message (if you have MyChart) OR . A paper copy in the mail If you have any lab test that is abnormal or we need to change your treatment, we will call you to review the results.   Testing/Procedures: None   Follow-Up: At [facility], you and your health needs are our priority.  As part of our continuing mission to provide you with exceptional heart care, we have created designated Provider Care Teams.  These Care Teams include your primary Cardiologist (physician) and Advanced Practice Providers (APPs -  Physician Assistants and Nurse Practitioners) who all work together to provide you with the care you need, when you need it.    Your next appointment:   12 month(s)  The format for your next appointment:   In Person  Provider:   You may see Larae Grooms, MD or one of the following Advanced Practice Providers on your designated Care Team:    Melina Copa, PA-C  Ermalinda Barrios, PA-C

## 2020-02-26 ENCOUNTER — Other Ambulatory Visit: Payer: Self-pay

## 2020-02-26 ENCOUNTER — Encounter: Payer: Self-pay | Admitting: Internal Medicine

## 2020-02-26 ENCOUNTER — Ambulatory Visit: Payer: 59 | Admitting: Internal Medicine

## 2020-02-26 VITALS — BP 156/96 | HR 71 | Temp 98.5°F | Resp 16 | Ht 67.0 in | Wt 307.0 lb

## 2020-02-26 DIAGNOSIS — E7800 Pure hypercholesterolemia, unspecified: Secondary | ICD-10-CM

## 2020-02-26 DIAGNOSIS — T502X5A Adverse effect of carbonic-anhydrase inhibitors, benzothiadiazides and other diuretics, initial encounter: Secondary | ICD-10-CM

## 2020-02-26 DIAGNOSIS — K580 Irritable bowel syndrome with diarrhea: Secondary | ICD-10-CM | POA: Diagnosis not present

## 2020-02-26 DIAGNOSIS — Z1159 Encounter for screening for other viral diseases: Secondary | ICD-10-CM | POA: Insufficient documentation

## 2020-02-26 DIAGNOSIS — Z0000 Encounter for general adult medical examination without abnormal findings: Secondary | ICD-10-CM

## 2020-02-26 DIAGNOSIS — I10 Essential (primary) hypertension: Secondary | ICD-10-CM | POA: Diagnosis not present

## 2020-02-26 DIAGNOSIS — E876 Hypokalemia: Secondary | ICD-10-CM | POA: Insufficient documentation

## 2020-02-26 MED ORDER — INDAPAMIDE 1.25 MG PO TABS: 1.2500 mg | ORAL_TABLET | Freq: Every day | ORAL | 0 refills | Status: DC

## 2020-02-26 MED ORDER — VIBERZI 75 MG PO TABS: 1.0000 | ORAL_TABLET | Freq: Two times a day (BID) | ORAL | 0 refills | Status: DC

## 2020-02-26 MED ORDER — OLMESARTAN MEDOXOMIL 20 MG PO TABS: 20.0000 mg | ORAL_TABLET | Freq: Every day | ORAL | 1 refills | Status: DC

## 2020-02-26 NOTE — Progress Notes (Signed)
Subjective:  Patient ID: Jessica Davenport, female    DOB: 31-Jul-1965  Age: 54 y.o. MRN: [id number]  CC: Annual Exam, Abdominal Pain, and Hypertension  This visit occurred during the SARS-CoV-2 public health emergency.  Safety protocols were in place, including screening questions prior to the visit, additional usage of staff PPE, and extensive cleaning of exam room while observing appropriate contact time as indicated for disinfecting solutions.    HPI Jessica Davenport presents for a CPX.  For many years she has had recurrent episodes of right upper quadrant pain.  The pain recurred about 2 months ago and she tells me she saw a gastroenterologist.  She recently underwent an ultrasound and it was negative for gallstones or NASH.  She describes the pain as intermittently sharp, intermittently dull, and intermittently aching.  She also has intermittent diarrhea.  She denies nausea, vomiting, loss of appetite, weight loss, melena, or bright red blood per rectum.   She is not real clear about whether or not she has been compliant with the thiazide diuretic and ARB.  It sounds like she has been recently taking Lasix for lower extremity edema.  She is not very active but when she is active she denies chest pain, shortness of breath, palpitations, diaphoresis, dizziness, lightheadedness, or near syncope.  She complains of chronic fatigue.  Outpatient Medications Prior to Visit  Medication Sig Dispense Refill  . albuterol (VENTOLIN HFA) 108 (90 Base) MCG/ACT inhaler     . cimetidine (TAGAMET) 400 MG tablet     . clobetasol cream (TEMOVATE) 1.19 % Apply 1 application topically 2 (two) times daily.    . clonazePAM (KLONOPIN) 1 MG tablet Take 1 tablet (1 mg total) by mouth 2 (two) times daily as needed for anxiety. 60 tablet 1  . EPINEPHrine 0.3 mg/0.3 mL IJ SOAJ injection     . furosemide (LASIX) 40 MG tablet Take 1 tablet (40 mg total) by mouth daily. 90 tablet 3  . hydrOXYzine (VISTARIL) 25 MG  capsule Take 25 mg by mouth 2 (two) times daily.    Marland Kitchen omeprazole (PRILOSEC) 20 MG capsule Take by mouth.    . potassium chloride SA (KLOR-CON) 20 MEQ tablet Take 1 tablet (20 mEq total) by mouth 2 (two) times daily. 180 tablet 1  . traZODone (DESYREL) 100 MG tablet Take 1 tablet (100 mg total) by mouth at bedtime. 90 tablet 0  . olmesartan (BENICAR) 20 MG tablet TAKE ONE TABLET BY MOUTH DAILY 30 tablet 0  . Cholecalciferol 1.25 MG (50000 UT) capsule Take 1 capsule (50,000 Units total) by mouth once a week. 12 capsule 0  . indapamide (LOZOL) 1.25 MG tablet Take 1 tablet (1.25 mg total) by mouth daily. 90 tablet 0   No facility-administered medications prior to visit.    ROS Review of Systems  Constitutional: Negative.  Negative for appetite change, chills, diaphoresis, fatigue and fever.  HENT: Negative.  Negative for trouble swallowing.   Eyes: Negative.   Respiratory: Negative for cough, chest tightness, shortness of breath and wheezing.   Cardiovascular: Negative for chest pain, palpitations and leg swelling.  Gastrointestinal: Positive for abdominal pain and diarrhea. Negative for blood in stool, constipation, nausea and vomiting.  Endocrine: Negative.   Genitourinary: Negative.  Negative for difficulty urinating, dysuria, flank pain, frequency, hematuria and urgency.  Musculoskeletal: Negative.  Negative for arthralgias, back pain and myalgias.  Skin: Negative.  Negative for color change and pallor.  Neurological: Negative.  Negative for dizziness, weakness, light-headedness and  headaches.  Hematological: Negative for adenopathy. Does not bruise/bleed easily.  Psychiatric/Behavioral: Negative.     Objective:  BP (!) 156/96 (BP Location: Left Arm, Patient Position: Sitting, Cuff Size: Large)   Pulse 71   Temp 98.5 F (36.9 C) (Oral)   Resp 16   Ht 5\' 7"  (1.702 m)   Wt (!) 307 lb (139.3 kg)   LMP 11/10/2011   SpO2 98%   BMI 48.08 kg/m   BP Readings from Last 3 Encounters:   02/26/20 (!) 156/96  02/12/20 (!) 136/92  01/15/20 130/80    Wt Readings from Last 3 Encounters:  02/26/20 (!) 307 lb (139.3 kg)  02/12/20 (!) 310 lb (140.6 kg)  01/15/20 (!) 301 lb 6 oz (136.7 kg)    Physical Exam Vitals reviewed.  Constitutional:      General: She is not in acute distress.    Appearance: She is obese. She is not toxic-appearing or diaphoretic.  HENT:     Nose: Nose normal.     Mouth/Throat:     Mouth: Mucous membranes are moist.  Eyes:     General: No scleral icterus.    Extraocular Movements: Extraocular movements intact.  Cardiovascular:     Rate and Rhythm: Normal rate and regular rhythm.     Heart sounds: No murmur heard.   Pulmonary:     Effort: Pulmonary effort is normal.     Breath sounds: No stridor. No wheezing, rhonchi or rales.  Abdominal:     General: Abdomen is protuberant. Bowel sounds are normal. There is no distension.     Palpations: There is no hepatomegaly, splenomegaly or mass.     Tenderness: There is abdominal tenderness in the right upper quadrant. There is no guarding or rebound.     Hernia: No hernia is present.  Musculoskeletal:        General: Normal range of motion.     Cervical back: Neck supple.     Right lower leg: No edema.     Left lower leg: No edema.  Lymphadenopathy:     Cervical: No cervical adenopathy.  Skin:    General: Skin is warm and dry.     Coloration: Skin is not pale.  Neurological:     General: No focal deficit present.     Mental Status: She is alert and oriented to person, place, and time.  Psychiatric:        Mood and Affect: Mood normal.        Behavior: Behavior normal.        Thought Content: Thought content normal.        Judgment: Judgment normal.     Lab Results  Component Value Date   WBC 11.2 (H) 01/15/2020   HGB 12.7 01/15/2020   HCT 39.0 01/15/2020   PLT 341.0 01/15/2020   GLUCOSE 94 02/26/2020   CHOL 211 (H) 02/26/2020   TRIG 138 02/26/2020   HDL 51 02/26/2020    LDLDIRECT 148.1 06/16/2012   LDLCALC 133 (H) 02/26/2020   ALT 15 01/15/2020   AST 14 01/15/2020   NA 140 02/26/2020   K 3.8 02/26/2020   CL 101 02/26/2020   CREATININE 0.70 02/26/2020   BUN 10 02/26/2020   CO2 30 02/26/2020   TSH 0.94 02/26/2020   HGBA1C 6.3 01/15/2020    US Abdomen Limited RUQ  Result Date: 01/23/2020 CLINICAL DATA:  Chronic right upper quadrant abdominal pain EXAM: ULTRASOUND ABDOMEN LIMITED RIGHT UPPER QUADRANT COMPARISON:  None. FINDINGS: Gallbladder: No  gallstones or wall thickening visualized. No sonographic Murphy sign noted by sonographer. Common bile duct: Diameter: 5 mm in proximal diameter. The mid and distal duct are obscured by overlying bowel gas. Liver: Evaluation of the liver parenchyma is slightly limited by the patient's body habitus and resultant acoustic attenuation. No focal lesion identified. Within normal limits in parenchymal echogenicity. Portal vein is patent on color Doppler imaging with normal direction of blood flow towards the liver. Other: No ascites IMPRESSION: Slightly limited but unremarkable examination Electronically Signed   By: Fidela Salisbury MD   On: 01/23/2020 09:27    Assessment & Plan:   Zeinab was seen today for annual exam, abdominal pain and hypertension.  Diagnoses and all orders for this visit:  Essential hypertension- Her blood pressure is not adequately well controlled.  I do not detect any edema today.  Her labs are negative for secondary causes or endorgan damage.  I prefer that she take a thiazide diuretic rather than a loop diuretic for blood pressure control.  I have also asked her to be compliant with the ARB.  She agrees to improve her lifestyle modifications. -     TSH; Future -     Urinalysis, Routine w reflex microscopic; Future -     olmesartan (BENICAR) 20 MG tablet; Take 1 tablet (20 mg total) by mouth daily. -     indapamide (LOZOL) 1.25 MG tablet; Take 1 tablet (1.25 mg total) by mouth daily. -      Urinalysis, Routine w reflex microscopic -     TSH  Routine general medical examination at a health care facility- Exam completed, labs reviewed, vaccines reviewed and updated, cancer screenings are up-to-date, patient education material was given. -     Lipid panel; Future -     HIV Antibody (routine testing w rflx); Future -     HIV Antibody (routine testing w rflx) -     Lipid panel  Need for hepatitis C screening test -     Hepatitis C antibody; Future -     Hepatitis C antibody  Diuretic-induced hypokalemia- Her potassium level is normal now. -     Magnesium; Future -     BASIC METABOLIC PANEL WITH GFR; Future -     BASIC METABOLIC PANEL WITH GFR -     Magnesium  Irritable bowel syndrome with diarrhea- I think her symptoms are c/w IBS.  I recommended that she try eluxadoline. -     Eluxadoline (VIBERZI) 75 MG TABS; Take 1 tablet by mouth in the morning and at bedtime.  Pure hypercholesterolemia- She does not have an elevated ASCVD risk score so I did not recommend a statin for CV risk reduction.   I have discontinued Jessica Davenport's Cholecalciferol. I have also changed her olmesartan. Additionally, I am having her start on Viberzi. Lastly, I am having her maintain her furosemide, cimetidine, clobetasol cream, hydrOXYzine, omeprazole, albuterol, EPINEPHrine, potassium chloride SA, clonazePAM, traZODone, and indapamide.  Meds ordered this encounter  Medications  . olmesartan (BENICAR) 20 MG tablet    Sig: Take 1 tablet (20 mg total) by mouth daily.    Dispense:  90 tablet    Refill:  1  . Eluxadoline (VIBERZI) 75 MG TABS    Sig: Take 1 tablet by mouth in the morning and at bedtime.    Dispense:  48 tablet    Refill:  0  . indapamide (LOZOL) 1.25 MG tablet    Sig: Take 1 tablet (1.25 mg  total) by mouth daily.    Dispense:  90 tablet    Refill:  0   In addition to time spent on CPE, I spent 50 minutes in preparing to see the patient by review of recent labs, imaging  and procedures, obtaining and reviewing separately obtained history, communicating with the patient and family or caregiver, ordering medications, tests or procedures, and documenting clinical information in the EHR including the differential Dx, treatment, and any further evaluation and other management of 1. Essential hypertension 2. Diuretic-induced hypokalemia 3. Irritable bowel syndrome with diarrhea 4. Pure hypercholesterolemia   Follow-up: Return in about 3 months (around 05/28/2020).  [name], MD

## 2020-02-26 NOTE — Patient Instructions (Signed)
Health Maintenance, Female Adopting a healthy lifestyle and getting preventive care are important in promoting health and wellness. Ask your health care provider about:  The right schedule for you to have regular tests and exams.  Things you can do on your own to prevent diseases and keep yourself healthy. What should I know about diet, weight, and exercise? Eat a healthy diet   Eat a diet that includes plenty of vegetables, fruits, low-fat dairy products, and lean protein.  Do not eat a lot of foods that are high in solid fats, added sugars, or sodium. Maintain a healthy weight Body mass index (BMI) is used to identify weight problems. It estimates body fat based on height and weight. Your health care provider can help determine your BMI and help you achieve or maintain a healthy weight. Get regular exercise Get regular exercise. This is one of the most important things you can do for your health. Most adults should:  Exercise for at least 150 minutes each week. The exercise should increase your heart rate and make you sweat (moderate-intensity exercise).  Do strengthening exercises at least twice a week. This is in addition to the moderate-intensity exercise.  Spend less time sitting. Even light physical activity can be beneficial. Watch cholesterol and blood lipids Have your blood tested for lipids and cholesterol at 52 years of age, then have this test every 5 years. Have your cholesterol levels checked more often if:  Your lipid or cholesterol levels are high.  You are older than 52 years of age.  You are at high risk for heart disease. What should I know about cancer screening? Depending on your health history and family history, you may need to have cancer screening at various ages. This may include screening for:  Breast cancer.  Cervical cancer.  Colorectal cancer.  Skin cancer.  Lung cancer. What should I know about heart disease, diabetes, and high blood  pressure? Blood pressure and heart disease  High blood pressure causes heart disease and increases the risk of stroke. This is more likely to develop in people who have high blood pressure readings, are of African descent, or are overweight.  Have your blood pressure checked: ? Every 3-5 years if you are 18-39 years of age. ? Every year if you are 40 years old or older. Diabetes Have regular diabetes screenings. This checks your fasting blood sugar level. Have the screening done:  Once every three years after age 40 if you are at a normal weight and have a low risk for diabetes.  More often and at a younger age if you are overweight or have a high risk for diabetes. What should I know about preventing infection? Hepatitis B If you have a higher risk for hepatitis B, you should be screened for this virus. Talk with your health care provider to find out if you are at risk for hepatitis B infection. Hepatitis C Testing is recommended for:  Everyone born from 1945 through 1965.  Anyone with known risk factors for hepatitis C. Sexually transmitted infections (STIs)  Get screened for STIs, including gonorrhea and chlamydia, if: ? You are sexually active and are younger than 52 years of age. ? You are older than 52 years of age and your health care provider tells you that you are at risk for this type of infection. ? Your sexual activity has changed since you were last screened, and you are at increased risk for chlamydia or gonorrhea. Ask your health care provider if   you are at risk.  Ask your health care provider about whether you are at high risk for HIV. Your health care provider may recommend a prescription medicine to help prevent HIV infection. If you choose to take medicine to prevent HIV, you should first get tested for HIV. You should then be tested every 3 months for as long as you are taking the medicine. Pregnancy  If you are about to stop having your period (premenopausal) and  you may become pregnant, seek counseling before you get pregnant.  Take 400 to 800 micrograms (mcg) of folic acid every day if you become pregnant.  Ask for birth control (contraception) if you want to prevent pregnancy. Osteoporosis and menopause Osteoporosis is a disease in which the bones lose minerals and strength with aging. This can result in bone fractures. If you are 65 years old or older, or if you are at risk for osteoporosis and fractures, ask your health care provider if you should:  Be screened for bone loss.  Take a calcium or vitamin D supplement to lower your risk of fractures.  Be given hormone replacement therapy (HRT) to treat symptoms of menopause. Follow these instructions at home: Lifestyle  Do not use any products that contain nicotine or tobacco, such as cigarettes, e-cigarettes, and chewing tobacco. If you need help quitting, ask your health care provider.  Do not use street drugs.  Do not share needles.  Ask your health care provider for help if you need support or information about quitting drugs. Alcohol use  Do not drink alcohol if: ? Your health care provider tells you not to drink. ? You are pregnant, may be pregnant, or are planning to become pregnant.  If you drink alcohol: ? Limit how much you use to 0-1 drink a day. ? Limit intake if you are breastfeeding.  Be aware of how much alcohol is in your drink. In the [place] one drink equals one 12 oz bottle of beer (355 mL), one 5 oz glass of wine (148 mL), or one 1 oz glass of hard liquor (44 mL). General instructions  Schedule regular health, dental, and eye exams.  Stay current with your vaccines.  Tell your health care provider if: ? You often feel depressed. ? You have ever been abused or do not feel safe at home. Summary  Adopting a healthy lifestyle and getting preventive care are important in promoting health and wellness.  Follow your health care provider's instructions about healthy  diet, exercising, and getting tested or screened for diseases.  Follow your health care provider's instructions on monitoring your cholesterol and blood pressure. This information is not intended to replace advice given to you by your health care provider. Make sure you discuss any questions you have with your health care provider. Document Revised: 06/28/2018 Document Reviewed: 06/28/2018 Elsevier Patient Education  2020 [employer]  

## 2020-02-27 LAB — MAGNESIUM: Magnesium: 1.8 mg/dL (ref 1.5–2.5)

## 2020-03-04 LAB — BASIC METABOLIC PANEL WITH GFR
BUN: 10 mg/dL (ref 7–25)
CO2: 30 mmol/L (ref 20–32)
Calcium: 9.8 mg/dL (ref 8.6–10.4)
Chloride: 101 mmol/L (ref 98–110)
Creat: 0.7 mg/dL (ref 0.50–1.05)
GFR, Est African American: 116 mL/min/{1.73_m2} (ref >=60)
GFR, Est Non African American: 100 mL/min/{1.73_m2} (ref >=60)
Glucose, Bld: 94 mg/dL (ref 65–99)
Potassium: 3.8 mmol/L (ref 3.5–5.3)
Sodium: 140 mmol/L (ref 135–146)

## 2020-03-04 LAB — URINALYSIS, ROUTINE W REFLEX MICROSCOPIC
Bilirubin Urine: NEGATIVE
Glucose, UA: NEGATIVE
Hgb urine dipstick: NEGATIVE
Ketones, ur: NEGATIVE
Leukocytes,Ua: NEGATIVE
Nitrite: NEGATIVE
Protein, ur: NEGATIVE
Specific Gravity, Urine: 1.024 (ref 1.001–1.03)
pH: 5.5 (ref 5.0–8.0)

## 2020-03-04 LAB — HEPATITIS C ANTIBODY
Hepatitis C Ab: NONREACTIVE
SIGNAL TO CUT-OFF: 0.06 (ref <1.00)

## 2020-03-04 LAB — LIPID PANEL
Cholesterol: 211 mg/dL — ABNORMAL HIGH (ref <200)
HDL: 51 mg/dL (ref >=50)
LDL Cholesterol (Calc): 133 mg/dL (calc) — ABNORMAL HIGH
Non-HDL Cholesterol (Calc): 160 mg/dL (calc) — ABNORMAL HIGH (ref <130)
Total CHOL/HDL Ratio: 4.1 (calc) (ref <5.0)
Triglycerides: 138 mg/dL (ref <150)

## 2020-03-04 LAB — HIV ANTIBODY (ROUTINE TESTING W REFLEX): HIV 1&2 Ab, 4th Generation: NONREACTIVE

## 2020-03-04 LAB — TSH: TSH: 0.94 mIU/L

## 2020-03-10 ENCOUNTER — Other Ambulatory Visit: Payer: Self-pay | Admitting: Interventional Cardiology

## 2020-03-14 ENCOUNTER — Ambulatory Visit: Payer: 59 | Admitting: Gastroenterology

## 2020-03-20 ENCOUNTER — Other Ambulatory Visit: Payer: Self-pay

## 2020-03-20 ENCOUNTER — Emergency Department (HOSPITAL_COMMUNITY): Payer: 59

## 2020-03-20 ENCOUNTER — Encounter (HOSPITAL_COMMUNITY): Payer: Self-pay

## 2020-03-20 ENCOUNTER — Emergency Department (HOSPITAL_COMMUNITY)
Admission: EM | Admit: 2020-03-20 | Discharge: 2020-03-21 | Disposition: A | Discharge status: Home / Self Care | Payer: 59 | Attending: Emergency Medicine | Admitting: Emergency Medicine

## 2020-03-20 ENCOUNTER — Telehealth: Payer: Self-pay | Admitting: Internal Medicine

## 2020-03-20 ENCOUNTER — Ambulatory Visit
Admission: EM | Admit: 2020-03-20 | Discharge: 2020-03-20 | Disposition: A | Discharge status: Home / Self Care | Payer: 59 | Source: Home / Self Care

## 2020-03-20 DIAGNOSIS — R200 Anesthesia of skin: Secondary | ICD-10-CM | POA: Diagnosis not present

## 2020-03-20 DIAGNOSIS — Z79899 Other long term (current) drug therapy: Secondary | ICD-10-CM | POA: Insufficient documentation

## 2020-03-20 DIAGNOSIS — R519 Headache, unspecified: Secondary | ICD-10-CM | POA: Diagnosis present

## 2020-03-20 DIAGNOSIS — R52 Pain, unspecified: Secondary | ICD-10-CM

## 2020-03-20 DIAGNOSIS — R0602 Shortness of breath: Secondary | ICD-10-CM | POA: Diagnosis not present

## 2020-03-20 DIAGNOSIS — Z7982 Long term (current) use of aspirin: Secondary | ICD-10-CM | POA: Diagnosis not present

## 2020-03-20 DIAGNOSIS — Z6841 Body Mass Index (BMI) 40.0 and over, adult: Secondary | ICD-10-CM | POA: Diagnosis not present

## 2020-03-20 DIAGNOSIS — R42 Dizziness and giddiness: Secondary | ICD-10-CM | POA: Insufficient documentation

## 2020-03-20 DIAGNOSIS — R5383 Other fatigue: Secondary | ICD-10-CM | POA: Diagnosis not present

## 2020-03-20 DIAGNOSIS — R0789 Other chest pain: Secondary | ICD-10-CM | POA: Insufficient documentation

## 2020-03-20 DIAGNOSIS — M7918 Myalgia, other site: Secondary | ICD-10-CM | POA: Insufficient documentation

## 2020-03-20 DIAGNOSIS — R9431 Abnormal electrocardiogram [ECG] [EKG]: Secondary | ICD-10-CM | POA: Insufficient documentation

## 2020-03-20 DIAGNOSIS — Z20822 Contact with and (suspected) exposure to covid-19: Secondary | ICD-10-CM | POA: Insufficient documentation

## 2020-03-20 DIAGNOSIS — I10 Essential (primary) hypertension: Secondary | ICD-10-CM | POA: Insufficient documentation

## 2020-03-20 DIAGNOSIS — R202 Paresthesia of skin: Secondary | ICD-10-CM

## 2020-03-20 LAB — TROPONIN I (HIGH SENSITIVITY)
Troponin I (High Sensitivity): <2 ng/L (ref <18)
Troponin I (High Sensitivity): 3 ng/L (ref <18)

## 2020-03-20 LAB — CBC
HCT: 39.8 % (ref 36.0–46.0)
Hemoglobin: 12.4 g/dL (ref 12.0–15.0)
MCH: 27.9 pg (ref 26.0–34.0)
MCHC: 31.2 g/dL (ref 30.0–36.0)
MCV: 89.6 fL (ref 80.0–100.0)
Platelets: 323 10*3/uL (ref 150–400)
RBC: 4.44 MIL/uL (ref 3.87–5.11)
RDW: 13.8 % (ref 11.5–15.5)
WBC: 8.6 10*3/uL (ref 4.0–10.5)
nRBC: 0 % (ref 0.0–0.2)

## 2020-03-20 LAB — BASIC METABOLIC PANEL
Anion gap: 11 (ref 5–15)
BUN: 10 mg/dL (ref 6–20)
CO2: 27 mmol/L (ref 22–32)
Calcium: 9.6 mg/dL (ref 8.9–10.3)
Chloride: 103 mmol/L (ref 98–111)
Creatinine, Ser: 0.78 mg/dL (ref 0.44–1.00)
GFR calc Af Amer: >60 mL/min (ref >60)
GFR calc non Af Amer: >60 mL/min (ref >60)
Glucose, Bld: 84 mg/dL (ref 70–99)
Potassium: 3.8 mmol/L (ref 3.5–5.1)
Sodium: 141 mmol/L (ref 135–145)

## 2020-03-20 LAB — I-STAT BETA HCG BLOOD, ED (MC, WL, AP ONLY): I-stat hCG, quantitative: <5 m[IU]/mL (ref <5)

## 2020-03-20 NOTE — ED Triage Notes (Signed)
Pt presents with body aches, HA and tingling. States she suddenly had heaviness in legs and arms.  Denies CP, SOB.  Had some dizziness earlier in day.

## 2020-03-20 NOTE — Discharge Instructions (Addendum)
Go to the Emergency Department for evaluation of your EKG changes.

## 2020-03-20 NOTE — ED Triage Notes (Signed)
Patient is being discharged from the Urgent Care and sent to the Emergency Department via private vehicle.  Per Barkley Boards NP, patient is in need of higher level of care due to EKG changes Patient is aware and verbalizes understanding of plan of care.  Vitals:   03/20/20 1303  BP: 137/81  Pulse: 72  Resp: 18  Temp: 98.1 F (36.7 C)  SpO2: 98%  .

## 2020-03-20 NOTE — Telephone Encounter (Signed)
[facility] REPORT /CALL : ---Caller states she has bodyaches, headaches, and SOB. She denies cough or congestion. She also had numbness in the left side of her lower lip which is resolving. She was exposed to Influenza  Patient advised go to ED now. Patient is at ED as advised.

## 2020-03-20 NOTE — ED Triage Notes (Addendum)
Pt reports she woke this am with a headache began having a "heviness" feeling to her upper body and feeling light headed with standing. Reports she "didn't feel right".   Pt also reports she felt fatigued earlier today

## 2020-03-20 NOTE — Telephone Encounter (Signed)
Noted  

## 2020-03-20 NOTE — ED Provider Notes (Signed)
[facility]    CSN: [id number] Arrival date & time: 03/20/20  1234      History   Chief Complaint Chief Complaint  Patient presents with  . Numbness    HPI Jessica Davenport is a 54 y.o. female.   Patient with tingling and heaviness in her extremities today.  She also reports dizziness, headache, and body aches.  She also reports numbness in her lip.  She denies focal weakness, chest pain, shortness of breath, abdominal pain, or other symptoms.  She had a telehealth visit today and was instructed to go to the emergency department; she decided to come to urgent care instead.  The history is provided by the patient.    Past Medical History:  Diagnosis Date  . Abscess   . Allergy   . Anemia   . Bilateral lower extremity edema 12/02/2016  . Fibroid, uterine   . Infected sebaceous cyst    mons pubis  . Iron deficiency anemia 08/19/2009       . Lumbar disc disease   . Morbid obesity ([place])   . Obesity, Class III, BMI 40-49.9 (morbid obesity) ([place]) 02/15/2012  . 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 Pulmonary HTN ([place]) 08/20/2009       . Pulmonary hypertension ([place])   . Pure hypercholesterolemia 06/16/2012  . Small bowel obstruction ([place]) 2013  . Vitamin D deficiency   . Vitamin D deficiency 08/19/2009   Qualifier: Diagnosis of  By: Jenny Reichmann MD, Hunt Oris     Patient Active Problem List   Diagnosis Date Noted  . Need for hepatitis C screening test 02/26/2020  . Diuretic-induced hypokalemia 02/26/2020  . 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 Leukocytosis 01/16/2020  . Esophageal dysphagia 01/15/2020  . GAD (generalized anxiety disorder) 01/09/2020  . Prediabetes 02/08/2019  . Routine general medical examination at a health care facility 02/08/2019  . Essential hypertension 01/08/2019  . Stasis dermatitis of both legs 01/16/2015  . ASD (atrial septal defect) 04/24/2013  .  Constipation 06/16/2012  . Pure hypercholesterolemia 06/16/2012  . Obesity, Class III, BMI 40-49.9 (morbid obesity) ([place]) 02/15/2012  . 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 Intrinsic eczema 01/08/2010  . Pulmonary HTN ([place]) 08/20/2009  . ALLERGIC RHINITIS 08/20/2009  . [place] DISEASE, LUMBAR 08/20/2009  . Vitamin D deficiency 08/19/2009  . Iron deficiency anemia 08/19/2009    Past Surgical History:  Procedure Laterality Date  . ABDOMINAL HYSTERECTOMY  12/07/11  . ASD REPAIR  2005/2009  . ENDOMETRIAL ABLATION  2010,2011,2012  . TUBAL LIGATION  1996  . uterine ablation      OB History   No obstetric history on file.      Home Medications    Prior to Admission medications   Medication Sig Start Date End Date Taking? Authorizing Provider  albuterol (VENTOLIN HFA) 108 (90 Base) MCG/ACT inhaler  09/23/19  Yes [provider]  cimetidine (TAGAMET) 400 MG tablet  07/19/19  Yes [provider]  clonazePAM (KLONOPIN) 1 MG tablet Take 1 tablet (1 mg total) by mouth 2 (two) times daily as needed for anxiety. 01/09/20  Yes Janith Lima, MD  EPINEPHrine 0.3 mg/0.3 mL IJ SOAJ injection  09/17/19  Yes [provider]  furosemide (LASIX) 40 MG tablet TAKE ONE TABLET BY MOUTH DAILY 03/12/20  Yes Jettie Booze, MD  hydrOXYzine (VISTARIL) 25 MG capsule Take 25 mg by  mouth 2 (two) times daily. 05/17/19  Yes [provider]  indapamide (LOZOL) 1.25 MG tablet Take 1 tablet (1.25 mg total) by mouth daily. 02/26/20  Yes Janith Lima, MD  olmesartan (BENICAR) 20 MG tablet Take 1 tablet (20 mg total) by mouth daily. 02/26/20  Yes Janith Lima, MD  omeprazole (PRILOSEC) 20 MG capsule Take by mouth.   Yes [provider]  traZODone (DESYREL) 100 MG tablet Take 1 tablet (100 mg total) by mouth at bedtime. 01/09/20  Yes Janith Lima, MD  clobetasol cream (TEMOVATE) 4.43 % Apply 1 application topically 2 (two) times daily. 05/17/19    [provider]  Eluxadoline (VIBERZI) 75 MG TABS Take 1 tablet by mouth in the morning and at bedtime. 02/26/20   Janith Lima, MD  potassium chloride SA (KLOR-CON) 20 MEQ tablet Take 1 tablet (20 mEq total) by mouth 2 (two) times daily. 09/25/19   Janith Lima, MD    Family History Family History  Problem Relation Age of Onset  . Coronary artery disease Mother   . Hypertension Mother   . Diabetes Mother   . Breast cancer Mother   . Coronary artery disease Father   . Hypertension Father   . Diabetes Father   . Anxiety disorder Sister   . Ovarian cancer Maternal Aunt   . Breast cancer Maternal Aunt   . Colon cancer Maternal Uncle   . Cervical cancer Maternal Aunt     Social History Social History   Tobacco Use  . Smoking status: Never Smoker  . Smokeless tobacco: Never Used  Vaping Use  . Vaping Use: Never used  Substance Use Topics  . Alcohol use: No  . Drug use: No     Allergies   Lisinopril and Other   Review of Systems Review of Systems  Constitutional: Negative for chills and fever.  HENT: Negative for ear pain and sore throat.   Eyes: Negative for pain and visual disturbance.  Respiratory: Negative for cough and shortness of breath.   Cardiovascular: Negative for chest pain and palpitations.  Gastrointestinal: Negative for abdominal pain and vomiting.  Genitourinary: Negative for dysuria and hematuria.  Musculoskeletal: Negative for arthralgias and back pain.  Skin: Negative for color change and rash.  Neurological: Positive for dizziness, numbness and headaches. Negative for seizures, syncope, facial asymmetry, speech difficulty and weakness.  All other systems reviewed and are negative.    Physical Exam Triage Vital Signs ED Triage Vitals  Enc Vitals Group     BP      Pulse      Resp      Temp      Temp src      SpO2      Weight      Height      Head Circumference      Peak Flow      Pain Score      Pain Loc      Pain Edu?        Excl. in [place]?    No data found.  Updated Vital Signs BP 137/81 (BP Location: Left Arm)   Pulse 72   Temp 98.1 F (36.7 C) (Oral)   Resp 18   LMP 11/10/2011   SpO2 98%   Visual Acuity Right Eye Distance:   Left Eye Distance:   Bilateral Distance:    Right Eye Near:   Left Eye Near:    Bilateral Near:  Physical Exam Vitals and nursing note reviewed.  Constitutional:      General: She is not in acute distress.    Appearance: She is well-developed. She is obese.  HENT:     Head: Normocephalic and atraumatic.     Mouth/Throat:     Mouth: Mucous membranes are moist.  Eyes:     Conjunctiva/sclera: Conjunctivae normal.  Cardiovascular:     Rate and Rhythm: Normal rate and regular rhythm.     Heart sounds: Normal heart sounds. No murmur heard.   Pulmonary:     Effort: Pulmonary effort is normal. No respiratory distress.     Breath sounds: Normal breath sounds. No wheezing or rhonchi.  Abdominal:     Palpations: Abdomen is soft.     Tenderness: There is no abdominal tenderness.  Musculoskeletal:     Cervical back: Neck supple.  Skin:    General: Skin is warm and dry.  Neurological:     General: No focal deficit present.     Mental Status: She is alert and oriented to person, place, and time.     Sensory: No sensory deficit.     Motor: No weakness.     Gait: Gait normal.  Psychiatric:        Mood and Affect: Mood normal.        Behavior: Behavior normal.      UC Treatments / Results  Labs (all labs ordered are listed, but only abnormal results are displayed) Labs Reviewed - No data to display  EKG   Radiology No results found.  Procedures Procedures (including critical care time)  Medications Ordered in UC Medications - No data to display  Initial Impression / Assessment and Plan / UC Course  I have reviewed the triage vital signs and the nursing notes.  Pertinent labs & imaging results that were available during my care of the patient were  reviewed by me and considered in my medical decision making (see chart for details).   Abnormal EKG, dizziness, headache, tingling in extremities.  EKG shows sinus rhythm, rate 70, compared to previous from July 2021, negative T waves in lead III, aVR, aVF, V1, V2, V3, V4, V5, V6; changed from previous.  Sending patient to the ED for evaluation.  She declines EMS and states she is stable to drive herself.     Final Clinical Impressions(s) / UC Diagnoses   Final diagnoses:  Abnormal EKG  Dizziness  Acute nonintractable headache, unspecified headache type  Tingling in extremities     Discharge Instructions     Go to the Emergency Department for evaluation of your EKG changes.      ED Prescriptions    None     PDMP not reviewed this encounter.   Sharion Balloon, NP 03/20/20 1316

## 2020-03-21 ENCOUNTER — Emergency Department (HOSPITAL_COMMUNITY): Payer: 59

## 2020-03-21 LAB — RESP PANEL BY RT PCR (RSV, FLU A&B, COVID)
Influenza A by PCR: NEGATIVE
Influenza B by PCR: NEGATIVE
Respiratory Syncytial Virus by PCR: NEGATIVE
SARS Coronavirus 2 by RT PCR: NEGATIVE

## 2020-03-21 MED ORDER — SODIUM CHLORIDE 0.9 % IV BOLUS
500.0000 mL | Freq: Once | INTRAVENOUS | Status: AC
  Administered 2020-03-21: 500 mL via INTRAVENOUS

## 2020-03-21 MED ORDER — PROCHLORPERAZINE EDISYLATE 10 MG/2ML IJ SOLN
10.0000 mg | Freq: Once | INTRAMUSCULAR | Status: AC
  Administered 2020-03-21: 10 mg via INTRAVENOUS
  Filled 2020-03-21: qty 2

## 2020-03-21 MED ORDER — DIPHENHYDRAMINE HCL 50 MG/ML IJ SOLN
25.0000 mg | Freq: Once | INTRAMUSCULAR | Status: AC
  Administered 2020-03-21: 25 mg via INTRAVENOUS
  Filled 2020-03-21: qty 1

## 2020-03-21 NOTE — ED Provider Notes (Signed)
[facility] EMERGENCY DEPARTMENT Provider Note   CSN: [id number] Arrival date & time: 03/20/20  1438     History Chief Complaint  Patient presents with  . Headache  . Chest Pain    Jessica Davenport is a 54 y.o. female.  HPI    Pt is a 54 y/o female with a h/o anemia, fibroids, morbid obesity, pulmonary hypertension, hypercholesterolemia, SBO, who presents to the ED today for eval of chest pain, headache and body aches.  At the time of my evaluation patient has been in the emergency department for 17 hours.  She states she awoke yesterday at 3 AM with a headache.  She took Excedrin which improved her symptoms.  She then went to work and noticed that she had an achy/tired feeling to her arms and legs and she felt significantly fatigued she also reports some chest discomfort described as "just a little bit "of pain noted to the anterior left chest wall.  She further reports that she had some mild shortness of breath which she now states is improved.  She also had a tingling sensation to her left lower lip/face and left hand which is now resolved.  She denies any weakness to the left upper extremity and denies any numbness/weakness of the left lower extremity.  Is been no reported visual changes, speech difficulty or aphasia.  Patient then called her PCP office and was advised to seek an evaluation.  She then went to urgent care where she had an EKG completed and was told that she had some EKG changes so she was sent to the emergency department.  Patient notes that she has recently been around her daughter who tested positive for influenza.  She reportedly had a negative Covid test.  Patient has not been immunized for Covid.  Patient follows with Dr. Illene Bolus with cardiology as well as Dr. Jenne Pane at [facility] who completed an ASD repair several years ago.  She has no known CAD.  Past Medical History:  Diagnosis Date  . Abscess   . Allergy   . Anemia   . Bilateral  lower extremity edema 12/02/2016  . Fibroid, uterine   . Infected sebaceous cyst    mons pubis  . Iron deficiency anemia 08/19/2009       . Lumbar disc disease   . Morbid obesity ([place])   . Obesity, Class III, BMI 40-49.9 (morbid obesity) ([place]) 02/15/2012  . 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 Pulmonary HTN ([place]) 08/20/2009       . Pulmonary hypertension ([place])   . Pure hypercholesterolemia 06/16/2012  . Small bowel obstruction ([place]) 2013  . Vitamin D deficiency   . Vitamin D deficiency 08/19/2009   Qualifier: Diagnosis of  By: Jenny Reichmann MD, Hunt Oris     Patient Active Problem List   Diagnosis Date Noted  . Need for hepatitis C screening test 02/26/2020  . Diuretic-induced hypokalemia 02/26/2020  . 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 Leukocytosis 01/16/2020  . Esophageal dysphagia 01/15/2020  . GAD (generalized anxiety disorder) 01/09/2020  . Prediabetes 02/08/2019  . Routine general medical examination at a health care facility 02/08/2019  . Essential hypertension 01/08/2019  . Stasis dermatitis of both legs 01/16/2015  . ASD (atrial septal defect) 04/24/2013  . Constipation 06/16/2012  . Pure hypercholesterolemia 06/16/2012  . Obesity, Class III, BMI 40-49.9 (morbid obesity) ([place]) 02/15/2012  . Ostium secundum type  atrial septal defect 09/04/2010  . Intrinsic eczema 01/08/2010  . Pulmonary HTN ([place]) 08/20/2009  . ALLERGIC RHINITIS 08/20/2009  . [place] DISEASE, LUMBAR 08/20/2009  . Vitamin D deficiency 08/19/2009  . Iron deficiency anemia 08/19/2009    Past Surgical History:  Procedure Laterality Date  . ABDOMINAL HYSTERECTOMY  12/07/11  . ASD REPAIR  2005/2009  . ENDOMETRIAL ABLATION  2010,2011,2012  . TUBAL LIGATION  1996  . uterine ablation       OB History   No obstetric history on file.     Family History  Problem Relation Age of Onset  . Coronary artery disease Mother     . Hypertension Mother   . Diabetes Mother   . Breast cancer Mother   . Coronary artery disease Father   . Hypertension Father   . Diabetes Father   . Anxiety disorder Sister   . Ovarian cancer Maternal Aunt   . Breast cancer Maternal Aunt   . Colon cancer Maternal Uncle   . Cervical cancer Maternal Aunt     Social History   Tobacco Use  . Smoking status: Never Smoker  . Smokeless tobacco: Never Used  Vaping Use  . Vaping Use: Never used  Substance Use Topics  . Alcohol use: No  . Drug use: No    Home Medications Prior to Admission medications   Medication Sig Start Date End Date Taking? Authorizing Provider  acetaminophen (TYLENOL) 500 MG tablet Take 1,000 mg by mouth every 6 (six) hours as needed for mild pain.   Yes [provider]  albuterol (VENTOLIN HFA) 108 (90 Base) MCG/ACT inhaler Inhale 2 puffs into the lungs every 6 (six) hours as needed for wheezing or shortness of breath.  09/23/19  Yes [provider]  aspirin-acetaminophen-caffeine (EXCEDRIN MIGRAINE) [id number] MG tablet Take 2 tablets by mouth every 6 (six) hours as needed for headache or migraine.   Yes [provider]  clonazePAM (KLONOPIN) 1 MG tablet Take 1 tablet (1 mg total) by mouth 2 (two) times daily as needed for anxiety. 01/09/20  Yes Janith Lima, MD  Eluxadoline (VIBERZI) 75 MG TABS Take 1 tablet by mouth in the morning and at bedtime. Patient taking differently: Take 75 mg by mouth 2 (two) times daily.  02/26/20  Yes Janith Lima, MD  EPINEPHrine 0.3 mg/0.3 mL IJ SOAJ injection Inject 0.3 mg into the muscle as needed for anaphylaxis.  09/17/19  Yes [provider]  furosemide (LASIX) 40 MG tablet TAKE ONE TABLET BY MOUTH DAILY Patient taking differently: Take 40 mg by mouth daily.  03/12/20  Yes Jettie Booze, MD  hydrOXYzine (VISTARIL) 25 MG capsule Take 25 mg by mouth 2 (two) times daily as needed for itching.  05/17/19  Yes [provider]   indapamide (LOZOL) 1.25 MG tablet Take 1 tablet (1.25 mg total) by mouth daily. 02/26/20  Yes Janith Lima, MD  olmesartan (BENICAR) 20 MG tablet Take 1 tablet (20 mg total) by mouth daily. 02/26/20  Yes Janith Lima, MD  omeprazole (PRILOSEC) 20 MG capsule Take 40 mg by mouth daily.    Yes [provider]  potassium chloride SA (KLOR-CON) 20 MEQ tablet Take 1 tablet (20 mEq total) by mouth 2 (two) times daily. 09/25/19  Yes Janith Lima, MD  traZODone (DESYREL) 100 MG tablet Take 1 tablet (100 mg total) by mouth at bedtime. 01/09/20  Yes Janith Lima, MD    Allergies    Lisinopril and  Other  Review of Systems   Review of Systems  Constitutional: Positive for fatigue.  HENT: Negative for ear pain and sore throat.   Eyes: Negative for pain and visual disturbance.  Respiratory: Positive for shortness of breath. Negative for cough.   Cardiovascular: Positive for chest pain.  Gastrointestinal: Negative for abdominal pain, constipation, diarrhea, nausea and vomiting.  Genitourinary: Negative for dysuria and hematuria.  Musculoskeletal: Positive for myalgias.  Skin: Negative for color change and rash.  Neurological: Positive for numbness and headaches. Negative for dizziness, facial asymmetry, speech difficulty, weakness and light-headedness.  All other systems reviewed and are negative.   Physical Exam Updated Vital Signs BP 132/74 (BP Location: Left Arm)   Pulse 72   Temp 98.6 F (37 C) (Oral)   Resp 18   LMP 11/10/2011   SpO2 100%   Physical Exam Vitals and nursing note reviewed.  Constitutional:      General: She is not in acute distress.    Appearance: She is well-developed.  HENT:     Head: Normocephalic and atraumatic.  Eyes:     Conjunctiva/sclera: Conjunctivae normal.  Cardiovascular:     Rate and Rhythm: Normal rate and regular rhythm.     Heart sounds: Normal heart sounds. No murmur heard.   Pulmonary:     Effort: Pulmonary effort is normal. No  respiratory distress.     Breath sounds: Normal breath sounds. No wheezing, rhonchi or rales.  Chest:     Chest wall: Tenderness (left upper chest (reproduces pain)) present.  Abdominal:     General: Bowel sounds are normal.     Palpations: Abdomen is soft.     Tenderness: There is no abdominal tenderness.  Musculoskeletal:     Cervical back: Neck supple.  Skin:    General: Skin is warm and dry.  Neurological:     Mental Status: She is alert.     Comments: Mental Status:  Alert, thought content appropriate, able to give a coherent history. Speech fluent without evidence of aphasia. Able to follow 2 step commands without difficulty.  Cranial Nerves:  II:  pupils equal, round, reactive to light III,IV, VI: ptosis not present, extra-ocular motions intact bilaterally  V,VII: smile symmetric, slightly decreased sensation the left side of the face VIII: hearing grossly normal to voice  X: uvula elevates symmetrically  XI: bilateral shoulder shrug symmetric and strong XII: midline tongue extension without fassiculations Motor:  Normal tone. 5/5 strength of BUE and BLE major muscle groups including strong and equal grip strength and dorsiflexion/plantar flexion Sensory: light touch normal in all extremities with exception of some decreased sensation to the fingers on the right hand      ED Results / Procedures / Treatments   Labs (all labs ordered are listed, but only abnormal results are displayed) Labs Reviewed  RESP PANEL BY RT PCR (RSV, FLU A&B, COVID)  BASIC METABOLIC PANEL  CBC  I-STAT BETA HCG BLOOD, ED ([facility], [facility], [facility] ONLY)  TROPONIN I (HIGH SENSITIVITY)  TROPONIN I (HIGH SENSITIVITY)    EKG None      Radiology DG Chest 2 View  Result Date: 03/20/2020 CLINICAL DATA:  Chest pain EXAM: CHEST - 2 VIEW COMPARISON:  Radiograph 321, CT 09/10/2004 FINDINGS: No consolidation, features of edema, pneumothorax, or effusion. Amplatzer device noted. Pulmonary vascularity is normally  distributed. The cardiomediastinal contours are unremarkable. No acute osseous or soft tissue abnormality. IMPRESSION: No acute cardiopulmonary abnormality. Electronically Signed   By: Elwin Sleight.D.  On: 03/20/2020 15:28   CT Head Wo Contrast  Result Date: 03/21/2020 CLINICAL DATA:  Headache, new or worsening. EXAM: CT HEAD WITHOUT CONTRAST TECHNIQUE: Contiguous axial images were obtained from the base of the skull through the vertex without intravenous contrast. COMPARISON:  None. FINDINGS: Brain: No evidence of acute infarction, hemorrhage, hydrocephalus, extra-axial collection or mass lesion/mass effect. Partially empty sella Vascular: No hyperdense vessel identified. Calcific intracranial atherosclerosis. Skull: Normal. Negative for fracture or focal lesion. Sinuses/Orbits: No acute finding. Other: None. IMPRESSION: 1. No acute intracranial abnormality. 2. Partially empty sella, which may be an incidental finding but can be seen with idiopathic intracranial hypertension in the correct clinical setting. Electronically Signed   By: Margaretha Sheffield MD   On: 03/21/2020 12:56    Procedures Procedures (including critical care time)  Medications Ordered in ED Medications  prochlorperazine (COMPAZINE) injection 10 mg (10 mg Intravenous Given 03/21/20 0959)  diphenhydrAMINE (BENADRYL) injection 25 mg (25 mg Intravenous Given 03/21/20 1006)  sodium chloride 0.9 % bolus 500 mL (500 mLs Intravenous New Bag/Given 03/21/20 1009)    ED Course  I have reviewed the triage vital signs and the nursing notes.  Pertinent labs & imaging results that were available during my care of the patient were reviewed by me and considered in my medical decision making (see chart for details).    MDM Rules/Calculators/A&P                          55 y/o F presenting to the ED for eval of headache, generalized body aches, numbness and concern for abnormal EKG  On my examination patient was complaining of left sided  facial numbness and numbness to the left hand however on exam she had some decrease sensation to the face and the right hand.  Dr. Billy Fischer, supervising physician also evaluated the patient and at that time she had sensation changes on the right side of the body rather than initially reported left-sided sensory changes.   Reviewed/interpreted labs CBC wnl BMP wnl Beta hcg neg Delta trops are negative I have very low suspicion for ACS at this time Covid/flu/RSV are negative  EKG was reviewed with Dr Leonette Monarch and he reviewed multiple prior EKGs of the patients and felt that her twave inversions have been chronic and unchanged for several years  CT scan of the head did not show any acute intracranial abnormalities but did show an empty sella which could be suggestive of idiopathic intracranial hypertension  On reassessment after migraine cocktail, patient states she feels completely back to baseline.  Her headache is gone and she no longer has any tingling.  She is moving all extremities and has been ambulatory in the ED. CT head neg.  Due to inconsistencies in neuro exam, acute CVA/TIA felt to be less likely. Felt this was more likely related to headache.  The remainder of her work-up has been reassuring and she feels back to baseline at this time. Due to her findings on head CT I will give ambulatory referral to neurology.  She states she is already reached out to her cardiologist to schedule an appointment as well.  At this time unclear etiology of symptoms however patient has remained stable in the emergency department for almost 24 hours and is currently feeling back to baseline I have a very low suspicion for any emergent process at this time that would require further work-up or admission.  Patient was seen in conjunction with Dr. Billy Fischer  who has evaluated the patient and is in agreement with this plan.  Patient discharged with plan for close follow-up and strict return precautions.  She voices  understanding and is in agreement.   Final Clinical Impression(s) / ED Diagnoses Final diagnoses:  Body aches  Nonintractable headache, unspecified chronicity pattern, unspecified headache type    Rx / DC Orders ED Discharge Orders         Ordered    Ambulatory referral to Neurology       Comments: An appointment is requested in approximately: 2 weeks   03/21/20 [address] Brenn Gatton S, PA-C 03/21/20 1326    Gareth Morgan, MD 03/24/20 1034

## 2020-03-21 NOTE — Discharge Instructions (Signed)
The CT scan of your head today showed that you may have something called intracranial hypertension.  For this you were given a referral to neurology.  They should be reaching out to you to schedule an appointment for follow-up.  Additionally you are recommended to follow-up with your regular doctor and also with your cardiologist in regards to your visit to the emergency department today.  We did not find any evidence to suggest your having a heart attack today and the remainder of your work-up was reassuring.  If you have any new or worsening symptoms in the meantime please return the emergency department immediately.

## 2020-03-25 ENCOUNTER — Other Ambulatory Visit: Payer: Self-pay | Admitting: Internal Medicine

## 2020-03-25 ENCOUNTER — Telehealth: Payer: Self-pay | Admitting: Emergency Medicine

## 2020-03-25 ENCOUNTER — Ambulatory Visit (INDEPENDENT_AMBULATORY_CARE_PROVIDER_SITE_OTHER): Payer: 59 | Admitting: Gastroenterology

## 2020-03-25 ENCOUNTER — Encounter: Payer: Self-pay | Admitting: Gastroenterology

## 2020-03-25 VITALS — BP 124/70 | HR 80 | Ht 67.0 in | Wt 306.4 lb

## 2020-03-25 DIAGNOSIS — R109 Unspecified abdominal pain: Secondary | ICD-10-CM

## 2020-03-25 DIAGNOSIS — G932 Benign intracranial hypertension: Secondary | ICD-10-CM

## 2020-03-25 MED ORDER — SUPREP BOWEL PREP KIT 17.5-3.13-1.6 GM/177ML PO SOLN: 1.0000 | ORAL | 0 refills | Status: DC

## 2020-03-25 NOTE — Telephone Encounter (Signed)
Called pt, LVM.   

## 2020-03-25 NOTE — Telephone Encounter (Addendum)
    Patient requesting referral to Neurlogy for evaluation of  Idiopathic intracranial hypertension. Patient states this is the diagnosis discussed during ED visit

## 2020-03-25 NOTE — Patient Instructions (Addendum)
If you are age 52 or older, your body mass index should be between 23-30. Your Body mass index is 47.99 kg/m. If this is out of the aforementioned range listed, please consider follow up with your Primary Care Provider.  If you are age 16 or younger, your body mass index should be between 19-25. Your Body mass index is 47.99 kg/m. If this is out of the aformentioned range listed, please consider follow up with your Primary Care Provider.   I recommend that you use a daily dose of psyllium or methylcellulose.  We could also consider a trial of dicyclomine for your abdominal pain.   You have been scheduled for an endoscopy and colonoscopy. Please follow the written instructions given to you at your visit today. Please pick up your prep supplies at the pharmacy within the next 1-3 days. If you use inhalers (even only as needed), please bring them with you on the day of your procedure.   We have sent the following medications to your pharmacy for you to pick up at your convenience: Suprep   Thank you for choosing me and [facility].  Dr.Kimberly Beavers

## 2020-03-25 NOTE — Progress Notes (Addendum)
Referring Provider: Janith Lima, MD Primary Care Physician:  Janith Lima, MD  Reason for Consultation:  Abdominal pain   IMPRESSION:  Intermittent right upper quadrant abdominal pain with associated change in bowel habits and anorexia not explained by abdominal ultrasound or contrasted CT scan  Suspected functional GI disease based on the chronicity. However, the differential also includes PUD, H pylori, gastritis, esophagitis, celiac disease, food intolerance (lactose, fructose, sucrose), SIBO, symptomatic gallbladder disease and less likely IBD. Given the diverticulosis seen on CT scan, must also consider symptomatic diverticular disease.   PLAN: - Continue Viberzi - Add a daily dose of psyllium or methylcellulose - Trial of dicyclomine 20 mg p.o. 4 times daily - EGD and Colonoscopy - If endoscopic evaluation is negative we will proceed with evaluation for symptomatic gallbladder disease  Please see the "Patient Instructions" section for addition details about the plan.  HPI: Jessica Davenport is a 54 y.o. female seen today at the request of Dr. Ronnald Ramp for abdominal pain, nausea, and change in bowel habits. She has high blood pressure, obesity, anxiety, anemia in 2019, reflux, pneumonia in 2000 and ASD closure in 2005 at [facility]. EF >60% on Echocardiogram 02/23/19. 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  Previously followed by Dr. Olevia Perches - last seen in 2013. Seen for similar symptoms.   She is seen today for a years of RUQ abdominal pain with associated nausea and change in bowel habits.  She was seen by Dr. Maurene Capes in 2013 for similar symptoms.  However the symptoms were so acute that had completely resolved by the time of her evaluation with Dr. Maurene Capes.  Has not sought GI care since that time.  However has recently had more concerning episodes.  She describes the pain is intermittently sharp, intermittently dull, intermittently achy, and frequently associated with abdominal wall pain  overlying the right upper quadrant.  Associated anorexia, loose stools, and hyperosmia. Worsened by eating within 15-20 minutes of eating can sometimes last 24 hours. No specific food triggers.  Temporally associated with stress.  No change with movement or defecation. No blood or mucous in the stool.   Recent evaluation has included:  01/15/20 Abdominal films showed no obstruction.  01/17/20 CT abd/pelvis with contrast showed diverticulosis without diverticulitis, no acute abnormality 01/23/20 Abdominal ultrasound essentially normal   She notes an improvement in her diarrhea with Viberzi but this has not improved her pain. Uses Tylenol as needed.  Denies the use of any NSAIDs.    Some anxiety that it could be cancer.  She is also concerned about an elevated white count identified during an episode of more severe pain in June.  May have some improvement with loose stools to treatment as prescribed by Dr. Ronnald Ramp.   She had a Cologard 02/2019 that was normal.   Gallbladder disease in her mother and sister. She thinks a maternal uncle may have had colon cancer.  No other known family history of colon cancer or polyps. No family history of uterine/endometrial cancer, pancreatic cancer or gastric/stomach cancer.   Past Medical History:  Diagnosis Date  . Abscess   . Allergy   . Anemia   . Bilateral lower extremity edema 12/02/2016  . Fibroid, uterine   . Infected sebaceous cyst    mons pubis  . Iron deficiency anemia 08/19/2009       . Lumbar disc disease   . Morbid obesity ([place])   . Obesity, Class III, BMI 40-49.9 (morbid obesity) ([place]) 02/15/2012  . Ostium  secundum 03/27/04   S/P ASD repair/Amplatzer device  . 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 Pulmonary HTN ([place]) 08/20/2009       . Pulmonary hypertension ([place])   . Pure hypercholesterolemia 06/16/2012  . Small bowel obstruction ([place]) 2013  . Vitamin D deficiency   . Vitamin D deficiency 08/19/2009   Qualifier: Diagnosis of   By: Jenny Reichmann MD, Hunt Oris     Past Surgical History:  Procedure Laterality Date  . ABDOMINAL HYSTERECTOMY  12/07/11  . ASD REPAIR  2005/2009  . ENDOMETRIAL ABLATION  2010,2011,2012  . TUBAL LIGATION  1996  . uterine ablation      Current Outpatient Medications  Medication Sig Dispense Refill  . acetaminophen (TYLENOL) 500 MG tablet Take 1,000 mg by mouth every 6 (six) hours as needed for mild pain.    Marland Kitchen albuterol (VENTOLIN HFA) 108 (90 Base) MCG/ACT inhaler Inhale 2 puffs into the lungs every 6 (six) hours as needed for wheezing or shortness of breath.     Marland Kitchen aspirin-acetaminophen-caffeine (EXCEDRIN MIGRAINE) 250-250-65 MG tablet Take 2 tablets by mouth every 6 (six) hours as needed for headache or migraine.    . clonazePAM (KLONOPIN) 1 MG tablet Take 1 tablet (1 mg total) by mouth 2 (two) times daily as needed for anxiety. 60 tablet 1  . Eluxadoline (VIBERZI) 75 MG TABS Take 1 tablet by mouth in the morning and at bedtime. (Patient taking differently: Take 75 mg by mouth 2 (two) times daily. ) 48 tablet 0  . EPINEPHrine 0.3 mg/0.3 mL IJ SOAJ injection Inject 0.3 mg into the muscle as needed for anaphylaxis.     . furosemide (LASIX) 40 MG tablet TAKE ONE TABLET BY MOUTH DAILY (Patient taking differently: Take 40 mg by mouth daily. ) 90 tablet 3  . hydrOXYzine (VISTARIL) 25 MG capsule Take 25 mg by mouth 2 (two) times daily as needed for itching.     . indapamide (LOZOL) 1.25 MG tablet Take 1 tablet (1.25 mg total) by mouth daily. 90 tablet 0  . olmesartan (BENICAR) 20 MG tablet Take 1 tablet (20 mg total) by mouth daily. 90 tablet 1  . omeprazole (PRILOSEC) 20 MG capsule Take 40 mg by mouth daily.     . potassium chloride SA (KLOR-CON) 20 MEQ tablet Take 1 tablet (20 mEq total) by mouth 2 (two) times daily. 180 tablet 1  . traZODone (DESYREL) 100 MG tablet Take 1 tablet (100 mg total) by mouth at bedtime. 90 tablet 0   No current facility-administered medications for this visit.    Allergies  as of 03/25/2020 - Review Complete 03/25/2020  Allergen Reaction Noted  . Lisinopril Hives 01/25/2019  . Other  06/01/2012    Family History  Problem Relation Age of Onset  . Coronary artery disease Mother   . Hypertension Mother   . Diabetes Mother   . Breast cancer Mother   . Coronary artery disease Father   . Hypertension Father   . Diabetes Father   . Anxiety disorder Sister   . Ovarian cancer Maternal Aunt   . Breast cancer Maternal Aunt   . Colon cancer Maternal Uncle   . Cervical cancer Maternal Aunt     Social History   Socioeconomic History  . Marital status: Divorced    Spouse name: Not on file  . Number of children: 3  . Years of education: Not on file  . Highest education level: Not on file  Occupational History  . Occupation:  adult Insurance account manager    Employer: [employer]  Tobacco Use  . Smoking status: Never Smoker  . Smokeless tobacco: Never Used  Vaping Use  . Vaping Use: Never used  Substance and Sexual Activity  . Alcohol use: No  . Drug use: No  . Sexual activity: Yes    Birth control/protection: Surgical  Other Topics Concern  . Not on file  Social History Narrative   No regular exercise   Social Determinants of Health   Financial Resource Strain:   . Difficulty of Paying Living Expenses: Not on file  Food Insecurity:   . Worried About Charity fundraiser in the Last Year: Not on file  . Ran Out of Food in the Last Year: Not on file  Transportation Needs:   . Lack of Transportation (Medical): Not on file  . Lack of Transportation (Non-Medical): Not on file  Physical Activity:   . Days of Exercise per Week: Not on file  . Minutes of Exercise per Session: Not on file  Stress:   . Feeling of Stress : Not on file  Social Connections:   . Frequency of Communication with Friends and Family: Not on file  . Frequency of Social Gatherings with Friends and Family: Not on file  . Attends Religious Services: Not on file  . Active Member  of Clubs or Organizations: Not on file  . Attends Archivist Meetings: Not on file  . Marital Status: Not on file  Intimate Partner Violence:   . Fear of Current or Ex-Partner: Not on file  . Emotionally Abused: Not on file  . Physically Abused: Not on file  . Sexually Abused: Not on file    Review of Systems: 12 system ROS is negative except as noted above with the additions of anxiety, back pain, fatigue, headaches, muscle pains, epistaxis, shortness of breath, insomnia.   Physical Exam: General:   Alert,  well-nourished, pleasant and cooperative in NAD Head:  Normocephalic and atraumatic. Eyes:  Sclera clear, no icterus.   Conjunctiva pink. Ears:  Normal auditory acuity. Nose:  No deformity, discharge,  or lesions. Mouth:  No deformity or lesions.   Neck:  Supple; no masses or thyromegaly. Lungs:  Clear throughout to auscultation.   No wheezes. Heart:  Regular rate and rhythm; no murmurs. Abdomen:  Soft,nontender, nondistended, normal bowel sounds, no rebound or guarding. No hepatosplenomegaly.   Rectal:  Deferred  Msk:  Symmetrical. No boney deformities LAD: No inguinal or umbilical LAD Extremities:  No clubbing or edema. Neurologic:  Alert and  oriented x4;  grossly nonfocal Skin:  Intact without significant lesions or rashes. Psych:  Alert and cooperative. Normal mood and affect.    Jessica Belmares L. Tarri Glenn, MD, MPH 03/25/2020, 3:03 PM

## 2020-03-25 NOTE — Telephone Encounter (Signed)
Pt called and was seen in the ER. She is now requesting a referral for Neurology. Thanks.

## 2020-03-26 ENCOUNTER — Ambulatory Visit: Payer: 59 | Admitting: Diagnostic Neuroimaging

## 2020-03-26 ENCOUNTER — Encounter: Payer: Self-pay | Admitting: Diagnostic Neuroimaging

## 2020-03-26 ENCOUNTER — Telehealth: Payer: Self-pay | Admitting: Diagnostic Neuroimaging

## 2020-03-26 VITALS — BP 122/77 | HR 75 | Ht 67.0 in | Wt 307.0 lb

## 2020-03-26 DIAGNOSIS — R519 Headache, unspecified: Secondary | ICD-10-CM

## 2020-03-26 NOTE — Telephone Encounter (Signed)
[employer] Auth: [id number] (exp. 03/26/20 to 05/10/20) order sent to GI. They will reach out to the patient to schedule.

## 2020-03-26 NOTE — Progress Notes (Signed)
[facility]  PATIENT: Jessica Davenport DOB: [birth year]  REFERRING CLINICIAN: Janith Lima, MD HISTORY FROM: patient  REASON FOR VISIT: new consult    HISTORICAL  CHIEF COMPLAINT:  Chief Complaint  Patient presents with  . IIH    rm 7 New Pt     HISTORY OF PRESENT ILLNESS:   54 year old female here for evaluation of headache, numbness, weakness.  Patient has had increasing headaches, nausea, sensitive to light and sound.  She has throbbing sensation on the left greater than right side.  She is also feeling heavy, tired, numbness sensations in her face, hands and legs.  Patient to emergency room for evaluation.  CT scan head and lab testing unremarkable.  Patient referred here for further evaluation.  Patient has had eye exam recently which apparently was unremarkable.   REVIEW OF SYSTEMS: Full 14 system review of systems performed and negative with exception of: As per HPI.  ALLERGIES: Allergies  Allergen Reactions  . Lisinopril Hives  . Other     Celery and green peas    HOME MEDICATIONS: Outpatient Medications Prior to Visit  Medication Sig Dispense Refill  . acetaminophen (TYLENOL) 500 MG tablet Take 1,000 mg by mouth every 6 (six) hours as needed for mild pain.    Marland Kitchen albuterol (VENTOLIN HFA) 108 (90 Base) MCG/ACT inhaler Inhale 2 puffs into the lungs every 6 (six) hours as needed for wheezing or shortness of breath.     Marland Kitchen aspirin-acetaminophen-caffeine (EXCEDRIN MIGRAINE) 250-250-65 MG tablet Take 2 tablets by mouth every 6 (six) hours as needed for headache or migraine.    . clonazePAM (KLONOPIN) 1 MG tablet Take 1 tablet (1 mg total) by mouth 2 (two) times daily as needed for anxiety. 60 tablet 1  . Eluxadoline (VIBERZI) 75 MG TABS Take 1 tablet by mouth in the morning and at bedtime. (Patient taking differently: Take 75 mg by mouth 2 (two) times daily. ) 48 tablet 0  . EPINEPHrine 0.3 mg/0.3 mL IJ SOAJ injection Inject 0.3 mg into the muscle as  needed for anaphylaxis.     . furosemide (LASIX) 40 MG tablet TAKE ONE TABLET BY MOUTH DAILY (Patient taking differently: Take 40 mg by mouth daily. ) 90 tablet 3  . hydrOXYzine (VISTARIL) 25 MG capsule Take 25 mg by mouth 2 (two) times daily as needed for itching.     . indapamide (LOZOL) 1.25 MG tablet Take 1 tablet (1.25 mg total) by mouth daily. 90 tablet 0  . Na Sulfate-K Sulfate-Mg Sulf (SUPREP BOWEL PREP KIT) 17.5-3.13-1.6 GM/177ML SOLN Take 1 kit by mouth as directed. For colonoscopy prep 354 mL 0  . olmesartan (BENICAR) 20 MG tablet Take 1 tablet (20 mg total) by mouth daily. 90 tablet 1  . omeprazole (PRILOSEC) 20 MG capsule Take 40 mg by mouth daily.     . potassium chloride SA (KLOR-CON) 20 MEQ tablet Take 1 tablet (20 mEq total) by mouth 2 (two) times daily. 180 tablet 1  . traZODone (DESYREL) 100 MG tablet Take 1 tablet (100 mg total) by mouth at bedtime. 90 tablet 0   No facility-administered medications prior to visit.    PAST MEDICAL HISTORY: Past Medical History:  Diagnosis Date  . Abscess   . Allergy   . Anemia   . Bilateral lower extremity edema 12/02/2016  . Fibroid, uterine   . Infected sebaceous cyst    mons pubis  . Iron deficiency anemia 08/19/2009       .  Lumbar disc disease   . Morbid obesity ([place])   . Obesity, Class III, BMI 40-49.9 (morbid obesity) ([place]) 02/15/2012  . 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 Pulmonary HTN ([place]) 08/20/2009       . Pulmonary hypertension ([place])   . Pure hypercholesterolemia 06/16/2012  . Small bowel obstruction ([place]) 2013  . Vitamin D deficiency   . Vitamin D deficiency 08/19/2009   Qualifier: Diagnosis of  By: Jenny Reichmann MD, Hunt Oris     PAST SURGICAL HISTORY: Past Surgical History:  Procedure Laterality Date  . ABDOMINAL HYSTERECTOMY  12/07/11  . ASD REPAIR  2005/2009  . ENDOMETRIAL ABLATION  2010,2011,2012  . TUBAL LIGATION  1996  . uterine ablation       FAMILY HISTORY: Family History  Problem Relation Age of Onset  . Coronary artery disease Mother   . Hypertension Mother   . Diabetes Mother   . Breast cancer Mother   . Coronary artery disease Father   . Hypertension Father   . Diabetes Father   . Anxiety disorder Sister   . Ovarian cancer Maternal Aunt   . Breast cancer Maternal Aunt   . Colon cancer Maternal Uncle   . Cervical cancer Maternal Aunt     SOCIAL HISTORY: Social History   Socioeconomic History  . Marital status: Divorced    Spouse name: Not on file  . Number of children: 3  . Years of education: Not on file  . Highest education level: Not on file  Occupational History  . Occupation: adult Firefighter: [employer]  Tobacco Use  . Smoking status: Never Smoker  . Smokeless tobacco: Never Used  Vaping Use  . Vaping Use: Never used  Substance and Sexual Activity  . Alcohol use: No  . Drug use: No  . Sexual activity: Yes    Birth control/protection: Surgical  Other Topics Concern  . Not on file  Social History Narrative   No regular exercise   Social Determinants of Health   Financial Resource Strain:   . Difficulty of Paying Living Expenses: Not on file  Food Insecurity:   . Worried About Charity fundraiser in the Last Year: Not on file  . Ran Out of Food in the Last Year: Not on file  Transportation Needs:   . Lack of Transportation (Medical): Not on file  . Lack of Transportation (Non-Medical): Not on file  Physical Activity:   . Days of Exercise per Week: Not on file  . Minutes of Exercise per Session: Not on file  Stress:   . Feeling of Stress : Not on file  Social Connections:   . Frequency of Communication with Friends and Family: Not on file  . Frequency of Social Gatherings with Friends and Family: Not on file  . Attends Religious Services: Not on file  . Active Member of Clubs or Organizations: Not on file  . Attends Archivist Meetings: Not on  file  . Marital Status: Not on file  Intimate Partner Violence:   . Fear of Current or Ex-Partner: Not on file  . Emotionally Abused: Not on file  . Physically Abused: Not on file  . Sexually Abused: Not on file     PHYSICAL EXAM  GENERAL EXAM/CONSTITUTIONAL: Vitals:  Vitals:   03/26/20 0950  BP: 122/77  Pulse: 75  Weight: (!) 307 lb (139.3 kg)  Height: 5'  7" (1.702 m)     Body mass index is 48.08 kg/m. Wt Readings from Last 3 Encounters:  03/26/20 (!) 307 lb (139.3 kg)  03/25/20 (!) 306 lb 6.4 oz (139 kg)  02/26/20 (!) 307 lb (139.3 kg)     Patient is in no distress; well developed, nourished and groomed; neck is supple  CARDIOVASCULAR:  Examination of carotid arteries is normal; no carotid bruits  Regular rate and rhythm, no murmurs  Examination of peripheral vascular system by observation and palpation is normal  EYES:  Ophthalmoscopic exam of optic discs and posterior segments is normal; no papilledema or hemorrhages  No exam data present  MUSCULOSKELETAL:  Gait, strength, tone, movements noted in Neurologic exam below  NEUROLOGIC: MENTAL STATUS:  No flowsheet data found.  awake, alert, oriented to person, place and time  recent and remote memory intact  normal attention and concentration  language fluent, comprehension intact, naming intact  fund of knowledge appropriate  CRANIAL NERVE:   2nd - no papilledema on fundoscopic exam  2nd, 3rd, 4th, 6th - pupils equal and reactive to light, visual fields full to confrontation, extraocular muscles intact, no nystagmus  5th - facial sensation symmetric  7th - facial strength symmetric  8th - hearing intact  9th - palate elevates symmetrically, uvula midline  11th - shoulder shrug symmetric  12th - tongue protrusion midline  MOTOR:   normal bulk and tone, full strength in the BUE, BLE  SENSORY:   normal and symmetric to light touch, temperature, vibration  COORDINATION:    finger-nose-finger, fine finger movements normal  REFLEXES:   deep tendon reflexes present and symmetric  GAIT/STATION:   narrow based gait     DIAGNOSTIC DATA (LABS, IMAGING, TESTING) - I reviewed patient records, labs, notes, testing and imaging myself where available.  Lab Results  Component Value Date   WBC 8.6 03/20/2020   HGB 12.4 03/20/2020   HCT 39.8 03/20/2020   MCV 89.6 03/20/2020   PLT 323 03/20/2020      Component Value Date/Time   NA 141 03/20/2020 1500   NA 142 05/05/2018 0924   K 3.8 03/20/2020 1500   CL 103 03/20/2020 1500   CO2 27 03/20/2020 1500   GLUCOSE 84 03/20/2020 1500   BUN 10 03/20/2020 1500   BUN 11 05/05/2018 0924   CREATININE 0.78 03/20/2020 1500   CREATININE 0.70 02/26/2020 1343   CALCIUM 9.6 03/20/2020 1500   PROT 8.2 01/15/2020 1418   PROT 5.9 (L) 05/05/2018 0924   ALBUMIN 4.2 01/15/2020 1418   ALBUMIN 3.7 05/05/2018 0924   AST 14 01/15/2020 1418   ALT 15 01/15/2020 1418   ALKPHOS 72 01/15/2020 1418   BILITOT 0.3 01/15/2020 1418   BILITOT 0.3 05/05/2018 0924   GFRNONAA >60 03/20/2020 1500   GFRNONAA 100 02/26/2020 1343   GFRAA >60 03/20/2020 1500   GFRAA 116 02/26/2020 1343   Lab Results  Component Value Date   CHOL 211 (H) 02/26/2020   HDL 51 02/26/2020   LDLCALC 133 (H) 02/26/2020   LDLDIRECT 148.1 06/16/2012   TRIG 138 02/26/2020   CHOLHDL 4.1 02/26/2020   Lab Results  Component Value Date   HGBA1C 6.3 01/15/2020   No results found for: VITAMINB12 Lab Results  Component Value Date   TSH 0.94 02/26/2020    03/21/20 CT head ([I reviewed images myself and agree with interpretation. -VRP]  1. No acute intracranial abnormality. 2. Partially empty sella, which may be an incidental finding but can  be seen with idiopathic intracranial hypertension in the correct clinical setting.     ASSESSMENT AND PLAN  54 y.o. year old female here with numbness, weakness, fatigue, heaviness, headaches.  We will proceed with  further work-up.  Ddx: migraine vs stress reaction vs other secondary cause  1. New onset of headaches after age 71      PLAN:  HEADACHES / BODY ACHES / NUMBNESS (since Sept 2021) - check MRI brain - then consider migraine treatments  Orders Placed This Encounter  Procedures  . MR BRAIN W WO CONTRAST   Return for pending if symptoms worsen or fail to improve.    Penni Bombard, MD 03/24/931, 67:12 AM Certified in Neurology, Neurophysiology and Neuroimaging  [facility] [address], [place], [place] [address] [phone]

## 2020-04-03 ENCOUNTER — Ambulatory Visit
Admission: RE | Admit: 2020-04-03 | Discharge: 2020-04-03 | Disposition: A | Discharge status: Home / Self Care | Payer: 59 | Source: Ambulatory Visit | Attending: Diagnostic Neuroimaging | Admitting: Diagnostic Neuroimaging

## 2020-04-03 ENCOUNTER — Other Ambulatory Visit: Payer: Self-pay

## 2020-04-03 DIAGNOSIS — R519 Headache, unspecified: Secondary | ICD-10-CM

## 2020-04-11 ENCOUNTER — Telehealth: Payer: Self-pay | Admitting: Internal Medicine

## 2020-04-11 ENCOUNTER — Ambulatory Visit: Payer: 59 | Admitting: Family

## 2020-04-11 ENCOUNTER — Encounter: Payer: Self-pay | Admitting: Family

## 2020-04-11 ENCOUNTER — Other Ambulatory Visit: Payer: Self-pay

## 2020-04-11 VITALS — BP 132/72 | HR 78 | Temp 97.9°F | Ht 67.0 in | Wt 313.0 lb

## 2020-04-11 DIAGNOSIS — R21 Rash and other nonspecific skin eruption: Secondary | ICD-10-CM | POA: Diagnosis not present

## 2020-04-11 MED ORDER — METHYLPREDNISOLONE ACETATE 40 MG/ML IJ SUSP
40.0000 mg | Freq: Once | INTRAMUSCULAR | Status: AC
  Administered 2020-04-11: 40 mg via INTRAMUSCULAR

## 2020-04-11 NOTE — Telephone Encounter (Signed)
[facility] Call/Report : Caller states she had the COVID vaccine on Monday. She has hives and the injection site is red and warm. She has soreness and a knot under her arm pit and cannot put her arm down like normal.  Patient called the office and appointment has been made for today 9/24 with Jodi Mourning.

## 2020-04-11 NOTE — Progress Notes (Signed)
Jessica Davenport is a 54 y.o. female with the following history as recorded in EpicCare:  Patient Active Problem List   Diagnosis Date Noted  . Idiopathic intracranial hypertension 03/25/2020  . Need for hepatitis C screening test 02/26/2020  . Diuretic-induced hypokalemia 02/26/2020  . 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 Leukocytosis 01/16/2020  . Esophageal dysphagia 01/15/2020  . GAD (generalized anxiety disorder) 01/09/2020  . Prediabetes 02/08/2019  . Routine general medical examination at a health care facility 02/08/2019  . Essential hypertension 01/08/2019  . Stasis dermatitis of both legs 01/16/2015  . ASD (atrial septal defect) 04/24/2013  . Constipation 06/16/2012  . Pure hypercholesterolemia 06/16/2012  . Obesity, Class III, BMI 40-49.9 (morbid obesity) ([place]) 02/15/2012  . 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 Intrinsic eczema 01/08/2010  . Pulmonary HTN ([place]) 08/20/2009  . ALLERGIC RHINITIS 08/20/2009  . [place] DISEASE, LUMBAR 08/20/2009  . Vitamin D deficiency 08/19/2009  . Iron deficiency anemia 08/19/2009    Current Outpatient Medications  Medication Sig Dispense Refill  . acetaminophen (TYLENOL) 500 MG tablet Take 1,000 mg by mouth every 6 (six) hours as needed for mild pain.    Marland Kitchen aspirin-acetaminophen-caffeine (EXCEDRIN MIGRAINE) 250-250-65 MG tablet Take 2 tablets by mouth every 6 (six) hours as needed for headache or migraine.    . clonazePAM (KLONOPIN) 1 MG tablet Take 1 tablet (1 mg total) by mouth 2 (two) times daily as needed for anxiety. 60 tablet 1  . Eluxadoline (VIBERZI) 75 MG TABS Take 1 tablet by mouth in the morning and at bedtime. (Patient taking differently: Take 75 mg by mouth 2 (two) times daily. ) 48 tablet 0  . EPINEPHrine 0.3 mg/0.3 mL IJ SOAJ injection Inject 0.3 mg into the muscle as needed for anaphylaxis.     . furosemide (LASIX) 40 MG tablet TAKE ONE TABLET BY MOUTH DAILY (Patient taking differently: Take  40 mg by mouth daily. ) 90 tablet 3  . hydrOXYzine (VISTARIL) 25 MG capsule Take 25 mg by mouth 2 (two) times daily as needed for itching.     . indapamide (LOZOL) 1.25 MG tablet Take 1 tablet (1.25 mg total) by mouth daily. 90 tablet 0  . Na Sulfate-K Sulfate-Mg Sulf (SUPREP BOWEL PREP KIT) 17.5-3.13-1.6 GM/177ML SOLN Take 1 kit by mouth as directed. For colonoscopy prep 354 mL 0  . olmesartan (BENICAR) 20 MG tablet Take 1 tablet (20 mg total) by mouth daily. 90 tablet 1  . omeprazole (PRILOSEC) 20 MG capsule Take 40 mg by mouth daily.     . potassium chloride SA (KLOR-CON) 20 MEQ tablet Take 1 tablet (20 mEq total) by mouth 2 (two) times daily. 180 tablet 1  . traZODone (DESYREL) 100 MG tablet Take 1 tablet (100 mg total) by mouth at bedtime. 90 tablet 0  . albuterol (VENTOLIN HFA) 108 (90 Base) MCG/ACT inhaler Inhale 2 puffs into the lungs every 6 (six) hours as needed for wheezing or shortness of breath.  (Patient not taking: Reported on 04/11/2020)     No current facility-administered medications for this visit.    Allergies: Lisinopril and Other  Past Medical History:  Diagnosis Date  . Abscess   . Allergy   . Anemia   . Bilateral lower extremity edema 12/02/2016  . Fibroid, uterine   . Infected sebaceous cyst    mons pubis  . Iron deficiency anemia 08/19/2009       . Lumbar disc disease   . Morbid obesity ([place])   .  Obesity, Class III, BMI 40-49.9 (morbid obesity) ([place]) 02/15/2012  . 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 Pulmonary HTN ([place]) 08/20/2009       . Pulmonary hypertension ([place])   . Pure hypercholesterolemia 06/16/2012  . Small bowel obstruction ([place]) 2013  . Vitamin D deficiency   . Vitamin D deficiency 08/19/2009   Qualifier: Diagnosis of  By: Jenny Reichmann MD, Hunt Oris     Past Surgical History:  Procedure Laterality Date  . ABDOMINAL HYSTERECTOMY  12/07/11  . ASD REPAIR  2005/2009  . ENDOMETRIAL  ABLATION  2010,2011,2012  . TUBAL LIGATION  1996  . uterine ablation      Family History  Problem Relation Age of Onset  . Coronary artery disease Mother   . Hypertension Mother   . Diabetes Mother   . Breast cancer Mother   . Coronary artery disease Father   . Hypertension Father   . Diabetes Father   . Anxiety disorder Sister   . Ovarian cancer Maternal Aunt   . Breast cancer Maternal Aunt   . Colon cancer Maternal Uncle   . Cervical cancer Maternal Aunt     Social History   Tobacco Use  . Smoking status: Never Smoker  . Smokeless tobacco: Never Used  Substance Use Topics  . Alcohol use: No    Subjective:  Patient had her first Moderna vaccine on Monday and is concerned about secondary allergy reaction; has multiple problems with hives and feels like the vaccine has caused her hives to react; also complaining of pain in her left axillary region;   Objective:  Vitals:   04/11/20 1407  BP: 132/72  Pulse: 78  Temp: 97.9 F (36.6 C)  TempSrc: Oral  SpO2: 98%  Weight: (!) 313 lb (142 kg)  Height: $Remove'5\' 7"'[id number]$  (1.702 m)    General: Well developed, well nourished, in no acute distress  Skin : Warm and dry. Localized area of redness on upper left arm; no warmth or streaking noted;  Head: Normocephalic and atraumatic  Lungs: Respirations unlabored;  Neurologic: Alert and oriented; speech intact; face symmetrical; moves all extremities well; CNII-XII intact without focal deficit   Assessment:  1. Rash     Plan:  Localized reaction to vaccine; reassurance that both this and the pain in her axillary lymph nodes are normal; due to severity of her urticaria in the past, will go ahead and give Depo-Medrol 40 IM today; she will continue to ice area and is encouraged to get her 2nd vaccine within the next 4-5 weeks to complete the series; she is also encouraged to hold her mammogram for the next 2 months and she agrees;  This visit occurred during the SARS-CoV-2 public health  emergency.  Safety protocols were in place, including screening questions prior to the visit, additional usage of staff PPE, and extensive cleaning of exam room while observing appropriate contact time as indicated for disinfecting solutions.     No follow-ups on file.  No orders of the defined types were placed in this encounter.   Requested Prescriptions    No prescriptions requested or ordered in this encounter

## 2020-04-19 ENCOUNTER — Other Ambulatory Visit: Payer: Self-pay | Admitting: Internal Medicine

## 2020-04-19 DIAGNOSIS — F411 Generalized anxiety disorder: Secondary | ICD-10-CM

## 2020-04-23 ENCOUNTER — Encounter: Payer: Self-pay | Admitting: Physician Assistant

## 2020-04-25 ENCOUNTER — Ambulatory Visit: Payer: 59 | Admitting: Physician Assistant

## 2020-04-28 ENCOUNTER — Telehealth: Payer: Self-pay | Admitting: Interventional Cardiology

## 2020-04-28 NOTE — Telephone Encounter (Signed)
Called and spoke to patient. She states that she had a local reaction to her covid vaccine and she saw her PCP who recommended that she wait a few weeks to receive the second dose.   Patient states that she went to Urgent Care on 9/2 and they told her that she had an abnormal EKG. She would like for Dr. Irish Lack to review. She states that she was having tingling and heaviness in her extremities associated with lip numbness, dizziness, and a headache at the time. She denies having any recurrent Sx.

## 2020-04-28 NOTE — Telephone Encounter (Signed)
Patient states that she still has a red spot on her arm from her covid shot that she got back on 09/20. She wants to make sure this is normal. Also, she states she went to [facility] and her EKG came back abnormal. She wants Dr. Irish Lack to look at it.

## 2020-04-28 NOTE — Telephone Encounter (Signed)
THere has been no change in her ECG.  Last ECG was normal for her and there was no sign of sudden blockage.  Heart blood test was also normal.

## 2020-04-29 ENCOUNTER — Ambulatory Visit: Payer: 59 | Admitting: Interventional Cardiology

## 2020-04-29 NOTE — Telephone Encounter (Signed)
Left detailed message of information below on patient's VM per DPR. Instructed for patient to call back with any questions or concerns.

## 2020-05-08 ENCOUNTER — Other Ambulatory Visit: Payer: 59

## 2020-05-09 ENCOUNTER — Encounter: Payer: 59 | Admitting: Gastroenterology

## 2020-05-16 ENCOUNTER — Ambulatory Visit: Payer: 59 | Admitting: Interventional Cardiology

## 2020-07-22 ENCOUNTER — Telehealth (INDEPENDENT_AMBULATORY_CARE_PROVIDER_SITE_OTHER): Payer: 59 | Admitting: Family Medicine

## 2020-07-22 ENCOUNTER — Telehealth: Payer: Self-pay | Admitting: Interventional Cardiology

## 2020-07-22 DIAGNOSIS — R0981 Nasal congestion: Secondary | ICD-10-CM | POA: Diagnosis not present

## 2020-07-22 DIAGNOSIS — R059 Cough, unspecified: Secondary | ICD-10-CM | POA: Diagnosis not present

## 2020-07-22 MED ORDER — ALBUTEROL SULFATE HFA 108 (90 BASE) MCG/ACT IN AERS: 2.0000 | INHALATION_SPRAY | Freq: Four times a day (QID) | RESPIRATORY_TRACT | 0 refills | Status: DC | PRN

## 2020-07-22 MED ORDER — BENZONATATE 100 MG PO CAPS: 100.0000 mg | ORAL_CAPSULE | Freq: Three times a day (TID) | ORAL | 0 refills | Status: DC | PRN

## 2020-07-22 NOTE — Progress Notes (Signed)
Virtual Visit via Video Note  I connected with Jessica Davenport  on 07/22/20 at  5:20 PM EST by a video enabled telemedicine application and verified that I am speaking with the correct person using two identifiers.  Location patient: home, [place] Location provider:work or home office Persons participating in the virtual visit: patient, provider  I discussed the limitations of evaluation and management by telemedicine and the availability of in person appointments. The patient expressed understanding and agreed to proceed.   HPI:  Acute telemedicine visit for possible covid exposure and feeling sick: -Onset: 2 days ago -sister told her she may have covid -Symptoms include: feels more tired than usual, headache, some mild chest congestion, sinus congestion, nasal congestion, mild cough, chills, body aches, mild loose bowels -Denies: fevers, loss of taste or smell, nausea, vomiting -Has tried: excedrin helped, theraflu -Pertinent past medical history: HTN, Pulm htn, also reports has used inhaler before when sick and requests refill -Pertinent medication allergies: lisinopril -COVID-19 vaccine status: fully vaccinated for covid, had flu shot  ROS: See pertinent positives and negatives per HPI.  Past Medical History:  Diagnosis Date  . Abscess   . Allergy   . Bilateral lower extremity edema 12/02/2016  . Essential hypertension   . Fibroid, uterine   . Infected sebaceous cyst    mons pubis  . Iron deficiency anemia 08/19/2009       . Lower extremity edema   . Lumbar disc disease   . Morbid obesity ([place])   . Obesity, Class III, BMI 40-49.9 (morbid obesity) ([place]) 02/15/2012  . Ostium secundum type atrial septal defect    s/p 28 mm Amplatzer device closure in 2005  . Pulmonary HTN ([place]) 08/20/2009       . Pure hypercholesterolemia 06/16/2012  . Small bowel obstruction ([place]) 2013  . Vitamin D deficiency     Past Surgical History:  Procedure Laterality Date  . ABDOMINAL HYSTERECTOMY  12/07/11   . ASD REPAIR  2005/2009  . ENDOMETRIAL ABLATION  2010,2011,2012  . TUBAL LIGATION  1996  . uterine ablation       Current Outpatient Medications:  .  benzonatate (TESSALON PERLES) 100 MG capsule, Take 1 capsule (100 mg total) by mouth 3 (three) times daily as needed., Disp: 20 capsule, Rfl: 0 .  acetaminophen (TYLENOL) 500 MG tablet, Take 1,000 mg by mouth every 6 (six) hours as needed for mild pain., Disp: , Rfl:  .  albuterol (VENTOLIN HFA) 108 (90 Base) MCG/ACT inhaler, Inhale 2 puffs into the lungs every 6 (six) hours as needed for wheezing or shortness of breath., Disp: 1 each, Rfl: 0 .  aspirin-acetaminophen-caffeine (EXCEDRIN MIGRAINE) 250-250-65 MG tablet, Take 2 tablets by mouth every 6 (six) hours as needed for headache or migraine., Disp: , Rfl:  .  clonazePAM (KLONOPIN) 1 MG tablet, Take 1 tablet (1 mg total) by mouth 2 (two) times daily as needed for anxiety., Disp: 60 tablet, Rfl: 1 .  Eluxadoline (VIBERZI) 75 MG TABS, Take 1 tablet by mouth in the morning and at bedtime. (Patient taking differently: Take 75 mg by mouth 2 (two) times daily. ), Disp: 48 tablet, Rfl: 0 .  EPINEPHrine 0.3 mg/0.3 mL IJ SOAJ injection, Inject 0.3 mg into the muscle as needed for anaphylaxis. , Disp: , Rfl:  .  furosemide (LASIX) 40 MG tablet, TAKE ONE TABLET BY MOUTH DAILY (Patient taking differently: Take 40 mg by mouth daily. ), Disp: 90 tablet, Rfl: 3 .  hydrOXYzine (VISTARIL) 25 MG capsule, Take  25 mg by mouth 2 (two) times daily as needed for itching. , Disp: , Rfl:  .  indapamide (LOZOL) 1.25 MG tablet, Take 1 tablet (1.25 mg total) by mouth daily., Disp: 90 tablet, Rfl: 0 .  Na Sulfate-K Sulfate-Mg Sulf (SUPREP BOWEL PREP KIT) 17.5-3.13-1.6 GM/177ML SOLN, Take 1 kit by mouth as directed. For colonoscopy prep, Disp: 354 mL, Rfl: 0 .  olmesartan (BENICAR) 20 MG tablet, Take 1 tablet (20 mg total) by mouth daily., Disp: 90 tablet, Rfl: 1 .  omeprazole (PRILOSEC) 20 MG capsule, Take 40 mg by mouth  daily. , Disp: , Rfl:  .  potassium chloride SA (KLOR-CON) 20 MEQ tablet, Take 1 tablet (20 mEq total) by mouth 2 (two) times daily., Disp: 180 tablet, Rfl: 1 .  traZODone (DESYREL) 100 MG tablet, TAKE ONE TABLET BY MOUTH AT BEDTIME, Disp: 90 tablet, Rfl: 1  EXAM:  VITALS per patient if applicable:  GENERAL: alert, oriented, appears well and in no acute distress  HEENT: atraumatic, conjunttiva clear, no obvious abnormalities on inspection of external nose and ears  NECK: normal movements of the head and neck  LUNGS: on inspection no signs of respiratory distress, breathing rate appears normal, no obvious gross SOB, gasping or wheezing  CV: no obvious cyanosis  MS: moves all visible extremities without noticeable abnormality  PSYCH/NEURO: pleasant and cooperative, no obvious depression or anxiety, speech and thought processing grossly intact  ASSESSMENT AND PLAN:  Discussed the following assessment and plan:  Nasal congestion  Cough  -we discussed possible serious and likely etiologies, options for evaluation and workup, limitations of telemedicine visit vs in person visit, treatment, treatment risks and precautions. Pt prefers to treat via telemedicine empirically rather than in person at this moment.  Possible COVID-19, viral upper respiratory illness versus other.  Her sister, whom she was well, is having Covid-like symptoms and is waiting on her test results.  Patient plans to go ahead and get Covid tested as well.  Advised of treatment options, potential complications, isolation and precautions.  Opted to treat for now with nasal saline, Tessalon for cough, provided a refill of her albuterol per her request. Work/School slipped offered: provided in patient instructions   Scheduled follow up with PCP offered:She agrees to schedule follow-up if needed. Advised to seek prompt in person care if worsening, new symptoms arise, or if is not improving with treatment. Discussed options  for inperson care if PCP office not available. Did let this patient know that I only do telemedicine on Tuesdays and Thursdays for [place]. Advised to schedule follow up visit with PCP or [facility] if any further questions or concerns to avoid delays in care.   I discussed the assessment and treatment plan with the patient. The patient was provided an opportunity to ask questions and all were answered. The patient agreed with the plan and demonstrated an understanding of the instructions.     Lucretia Kern, DO

## 2020-07-22 NOTE — Telephone Encounter (Signed)
Pt c/o Shortness Of Breath: STAT if SOB developed within the last 24 hours or pt is noticeably SOB on the phone  1. Are you currently SOB (can you hear that pt is SOB on the phone)? A little ,   2.How long have you been experiencing SOB? Sunday night  3. Are you SOB when sitting or when up moving around? both  4. Are you currently experiencing any other symptoms?  Heaviness in her chest, headache, tiredi- pt wants to be seen asap

## 2020-07-22 NOTE — Patient Instructions (Addendum)
   ---------------------------------------------------------------------------------------------------------------------------      WORK SLIP:  Patient Jessica Davenport,  1966/02/23, was seen for a medical visit today, 07/22/20 . Please excuse from work according to the [facility] guidelines for a COVID like illness. We advise 10 days minimum from the onset of symptoms (07/19/20) PLUS 1 day of no fever and improved symptoms. Will defer to employer for a sooner return to work if COVID19 testing is negative and the symptoms have resolved. Advise following CDC guidelines.    Sincerely: E-signature: Dr. Kriste Basque, DO [facility] Ph: [phone]   ------------------------------------------------------------------------------------------------------------------------------     HOME CARE TIPS:  [name] COVID19 testing information: [email or web address] OR [phone] Most pharmacies also offer testing and home test kits.  -I sent the medication(s) we discussed to your pharmacy: Meds ordered this encounter  Medications  . albuterol (VENTOLIN HFA) 108 (90 Base) MCG/ACT inhaler    Sig: Inhale 2 puffs into the lungs every 6 (six) hours as needed for wheezing or shortness of breath.    Dispense:  1 each    Refill:  0  . benzonatate (TESSALON PERLES) 100 MG capsule    Sig: Take 1 capsule (100 mg total) by mouth 3 (three) times daily as needed.    Dispense:  20 capsule    Refill:  0     -can use tylenol for fevers, aches and pains per instructions  -can use nasal saline a few times per day if nasal congestion.  -stay hydrated, drink plenty of fluids and eat small healthy meals - avoid dairy  -can take 1000 IU Vit D3 and Vit C lozenges per instructions  -If the Covid test is positive, check out the [facility] website for more information on home care, transmission and treatment for COVID19  -follow up with your doctor in 2-3 days  unless improving and feeling better  -stay home while sick unless you need to seek medical care  It was nice to meet you today, and I really hope you are feeling better soon. I help House out with telemedicine visits on Tuesdays and Thursdays and am available for visits on those days. If you have any concerns or questions following this visit please schedule a follow up visit with your Primary Care doctor or seek care at a local urgent care clinic to avoid delays in care.    Seek in person care promptly if your symptoms worsen, new concerns arise or you are not improving with treatment. Call 911 and/or seek emergency care if you symptoms are severe or life threatening.

## 2020-07-22 NOTE — Telephone Encounter (Signed)
Patient reports symptoms since Sunday 07/20/20: headache, fatigue, heaviness on chest and SOB.  Taking Lasix, no changes to her usual LE swelling.  No fever.  Occas loose stool last 2 days.  Her sister told her this morning she has Covid symptoms and is going for a test.  She has been w her sister.  They are both vaccinated.  I adv the patient to get an appointment ASAP for covid testing and stay hydrated and that I will forward to Dr. Eldridge Dace to make aware.  She understands we will call her back if there are any other recommendations.

## 2020-07-23 ENCOUNTER — Telehealth: Payer: Self-pay | Admitting: Internal Medicine

## 2020-07-23 NOTE — Telephone Encounter (Signed)
    Patient calling to report albuterol (VENTOLIN HFA) 108 (90 Base) MCG/ACT inhaler is not covered on her insurance. She was prescribed inhaler after virtual visit with Dr Selena Batten on 07/22/20 Patient requesting advice/ alternative RX  from Dr Yetta Barre

## 2020-07-23 NOTE — Telephone Encounter (Signed)
Patient called and said her sister did test positive for COVID and she was waiting to get tested herself. The patient will send test results to Dr. Eldridge Dace via MyChart as soon as she gets them

## 2020-07-24 NOTE — Telephone Encounter (Signed)
Noted  

## 2020-07-25 ENCOUNTER — Encounter: Payer: Self-pay | Admitting: Internal Medicine

## 2020-07-28 ENCOUNTER — Encounter: Payer: Self-pay | Admitting: Internal Medicine

## 2020-07-28 ENCOUNTER — Other Ambulatory Visit: Payer: Self-pay | Admitting: Internal Medicine

## 2020-07-28 DIAGNOSIS — J4520 Mild intermittent asthma, uncomplicated: Secondary | ICD-10-CM

## 2020-07-28 MED ORDER — PROAIR RESPICLICK 108 (90 BASE) MCG/ACT IN AEPB: 1.0000 | INHALATION_SPRAY | Freq: Three times a day (TID) | RESPIRATORY_TRACT | 1 refills | Status: DC | PRN

## 2020-08-13 ENCOUNTER — Other Ambulatory Visit: Payer: Self-pay

## 2020-08-13 ENCOUNTER — Telehealth: Payer: Self-pay | Admitting: Interventional Cardiology

## 2020-08-13 ENCOUNTER — Ambulatory Visit: Payer: 59 | Admitting: Internal Medicine

## 2020-08-13 ENCOUNTER — Encounter: Payer: Self-pay | Admitting: Internal Medicine

## 2020-08-13 VITALS — BP 134/88 | HR 85 | Ht 67.0 in | Wt 320.0 lb

## 2020-08-13 DIAGNOSIS — R609 Edema, unspecified: Secondary | ICD-10-CM | POA: Diagnosis not present

## 2020-08-13 DIAGNOSIS — R0602 Shortness of breath: Secondary | ICD-10-CM

## 2020-08-13 LAB — CBC
Hematocrit: 40.2 % (ref 34.0–46.6)
Hemoglobin: 12.9 g/dL (ref 11.1–15.9)
MCH: 27.7 pg (ref 26.6–33.0)
MCHC: 32.1 g/dL (ref 31.5–35.7)
MCV: 86 fL (ref 79–97)
Platelets: 304 10*3/uL (ref 150–450)
RBC: 4.66 x10E6/uL (ref 3.77–5.28)
RDW: 13.7 % (ref 11.7–15.4)
WBC: 8.2 10*3/uL (ref 3.4–10.8)

## 2020-08-13 LAB — BASIC METABOLIC PANEL
BUN/Creatinine Ratio: 13 (ref 9–23)
BUN: 10 mg/dL (ref 6–24)
CO2: 26 mmol/L (ref 20–29)
Calcium: 9.6 mg/dL (ref 8.7–10.2)
Chloride: 101 mmol/L (ref 96–106)
Creatinine, Ser: 0.77 mg/dL (ref 0.57–1.00)
GFR calc Af Amer: 103 mL/min/{1.73_m2} (ref >59)
GFR calc non Af Amer: 89 mL/min/{1.73_m2} (ref >59)
Glucose: 105 mg/dL — ABNORMAL HIGH (ref 65–99)
Potassium: 3.8 mmol/L (ref 3.5–5.2)
Sodium: 142 mmol/L (ref 134–144)

## 2020-08-13 LAB — PRO B NATRIURETIC PEPTIDE: NT-Pro BNP: 40 pg/mL (ref 0–249)

## 2020-08-13 LAB — SEDIMENTATION RATE: Sed Rate: 36 mm/hr (ref 0–40)

## 2020-08-13 NOTE — Telephone Encounter (Signed)
Nurse in office spoke with patient

## 2020-08-13 NOTE — Patient Instructions (Signed)
Medication Instructions:  For today: Take 2 extra lasix tablets and one extra potassium tablets when you get home.  *If you need a refill on your cardiac medications before your next appointment, please call your pharmacy*   Lab Work: Today: STAT bmet, cbc, pro bnp  If you have labs (blood work) drawn today and your tests are completely normal, you will receive your results only by: Marland Kitchen MyChart Message (if you have MyChart) OR . A paper copy in the mail If you have any lab test that is abnormal or we need to change your treatment, we will call you to review the results.   Testing/Procedures: none   Follow-Up: As planned with Dr. Irish Lack  Other Instructions

## 2020-08-13 NOTE — Telephone Encounter (Signed)
New Message:      Pt says she is still at the office. She would like to speak to Dr Hassell Done nurse, she still have some concerns.

## 2020-08-13 NOTE — Telephone Encounter (Signed)
Pt c/o of Chest Pain: STAT if CP now or developed within 24 hours  1. Are you having CP right now? Achy discomfort in chest  2. Are you experiencing any other symptoms (ex. SOB, nausea, vomiting, sweating)?   so tired  3. How long have you been experiencing CP? Started last week  4. Is your CP continuous or coming and going? Comes andd goes  5. Have you taken Nitroglycerin? Never been on nitroglycerin ?

## 2020-08-13 NOTE — Progress Notes (Addendum)
Cardiology Office Note   Date:  08/13/2020   ID:  [name], DOB [birth year], MRN [id number]  PCP:  Janith Lima, MD  Cardiologist:   Dorris Carnes, MD    Patient calls in today and is seen for increased lower extremity edema, shortness of breath, chest tightness   History of Present Illness: Jessica Davenport is a 55 y.o. female with a history of ASD repair (status post closure in 2005) she is followed by Lendell Caprice  and also by Gildardo Griffes ( [facility]).  She is last seen by Dr. Irish Lack in July 2021.  She went on for her yearly follow-up at [facility] in August 2021.   The pt also has  a history of hypertension, chronic lower extremity edema and chronic shortness of breath  The patient called in today saying that she has been having increased lower extremity edema, episodes of chest pressure.  She is asked to be seen.  In talking to the patient her torsemide was changed to furosemide several months ago because she was not responding to the torsemide.  She has been on furosemide 40 mg daily for a while.  She has progressively noted increased lower extremity edema.  She says she drinks about 2 to 3 1/2 L bottles per day.  She does not add salt, only has the salt that is in food already.   The patient also complains of chest pains   SHe says they are sharp   She points to mid sternal area.  Spells radiate to back   Worse with deep breath  Current Meds  Medication Sig  . acetaminophen (TYLENOL) 500 MG tablet Take 1,000 mg by mouth every 6 (six) hours as needed for mild pain.  . Albuterol Sulfate (PROAIR RESPICLICK) 374 (90 Base) MCG/ACT AEPB Inhale 1 puff into the lungs 3 (three) times daily as needed.  Marland Kitchen aspirin-acetaminophen-caffeine (EXCEDRIN MIGRAINE) 250-250-65 MG tablet Take 2 tablets by mouth every 6 (six) hours as needed for headache or migraine.  . benzonatate (TESSALON PERLES) 100 MG capsule Take 1 capsule (100 mg total) by mouth 3 (three) times daily as needed.  . clonazePAM  (KLONOPIN) 1 MG tablet Take 1 tablet (1 mg total) by mouth 2 (two) times daily as needed for anxiety.  . Eluxadoline (VIBERZI) 75 MG TABS Take 1 tablet by mouth in the morning and at bedtime. (Patient taking differently: Take 75 mg by mouth 2 (two) times daily.)  . EPINEPHrine 0.3 mg/0.3 mL IJ SOAJ injection Inject 0.3 mg into the muscle as needed for anaphylaxis.   . furosemide (LASIX) 40 MG tablet TAKE ONE TABLET BY MOUTH DAILY (Patient taking differently: Take 40 mg by mouth daily.)  . hydrOXYzine (VISTARIL) 25 MG capsule Take 25 mg by mouth 2 (two) times daily as needed for itching.   . indapamide (LOZOL) 1.25 MG tablet Take 1 tablet (1.25 mg total) by mouth daily.  . Na Sulfate-K Sulfate-Mg Sulf (SUPREP BOWEL PREP KIT) 17.5-3.13-1.6 GM/177ML SOLN Take 1 kit by mouth as directed. For colonoscopy prep  . olmesartan (BENICAR) 20 MG tablet Take 1 tablet (20 mg total) by mouth daily.  Marland Kitchen omeprazole (PRILOSEC) 20 MG capsule Take 40 mg by mouth daily.   . potassium chloride SA (KLOR-CON) 20 MEQ tablet Take 1 tablet (20 mEq total) by mouth 2 (two) times daily.  . traZODone (DESYREL) 100 MG tablet TAKE ONE TABLET BY MOUTH AT BEDTIME     Allergies:   Lisinopril and Other  Past Medical History:  Diagnosis Date  . Abscess   . Allergy   . Bilateral lower extremity edema 12/02/2016  . Essential hypertension   . Fibroid, uterine   . Infected sebaceous cyst    mons pubis  . Iron deficiency anemia 08/19/2009       . Lower extremity edema   . Lumbar disc disease   . Morbid obesity ([place])   . Obesity, Class III, BMI 40-49.9 (morbid obesity) ([place]) 02/15/2012  . Ostium secundum type atrial septal defect    s/p 28 mm Amplatzer device closure in 2005  . Pulmonary HTN ([place]) 08/20/2009       . Pure hypercholesterolemia 06/16/2012  . Small bowel obstruction ([place]) 2013  . Vitamin D deficiency     Past Surgical History:  Procedure Laterality Date  . ABDOMINAL HYSTERECTOMY  12/07/11  . ASD REPAIR   2005/2009  . ENDOMETRIAL ABLATION  2010,2011,2012  . TUBAL LIGATION  1996  . uterine ablation       Social History:  The patient  reports that she has never smoked. She has never used smokeless tobacco. She reports that she does not drink alcohol and does not use drugs.   Family History:  The patient's family history includes Anxiety disorder in her sister; Breast cancer in her maternal aunt and mother; Cervical cancer in her maternal aunt; Colon cancer in her maternal uncle; Coronary artery disease in her father and mother; Diabetes in her father and mother; Hypertension in her father and mother; Ovarian cancer in her maternal aunt.    ROS:  Please see the history of present illness. All other systems are reviewed and  Negative to the above problem except as noted.    PHYSICAL EXAM: VS:  BP 134/88   Pulse 85   Ht $R'5\' 7"'rF$  (1.702 m)   Wt (!) 320 lb (145.2 kg)   LMP 11/10/2011   SpO2 96%   BMI 50.12 kg/m   GEN:  Morbidly obese 55 yo , in no acute distress  HEENT: normal  Neck: no JVD, carotid bruits Cardiac: RRR; no murmurs, 1+ LE edema   Soft   Chest: Tender to palpation.  Brings on some of the pain. Respiratory:  clear to auscultation bilaterally, normal work of breathing GI: soft, nontender, obese + BS  No hepatomegaly  MS: no deformity Moving all extremities   Skin: warm and dry, no rash Neuro:  Strength and sensation are intact Psych: euthymic mood, full affect   EKG:  EKG is ordered today.  NSR  76 bpm  First degree AV block  Nonspecific ST changes    Lipid Panel    Component Value Date/Time   CHOL 211 (H) 02/26/2020 1343   CHOL 193 12/10/2016 0802   TRIG 138 02/26/2020 1343   HDL 51 02/26/2020 1343   HDL 47 12/10/2016 0802   CHOLHDL 4.1 02/26/2020 1343   VLDL 23.2 02/08/2019 1354   LDLCALC 133 (H) 02/26/2020 1343   LDLDIRECT 148.1 06/16/2012 0929      Wt Readings from Last 3 Encounters:  08/13/20 (!) 320 lb (145.2 kg)  04/11/20 (!) 313 lb (142 kg)   03/26/20 (!) 307 lb (139.3 kg)      ASSESSMENT AND PLAN: 1.  Lower extremity edema.  This has been a chronic problem after reviewing the chart she has noted progressive increase over the holidays.  She is not responding to the Lasix.  I have recommended she take 2 Lasix this afternoon with an extra  potassium she is already on potassium we will check a BMET, BNP and CBC.  Consider switching to torsemide again.  2 chest pain.  I am not convinced this represents angina.  Some is musculoskeletal.  It is sharp.  Fleeting.  Not associated with activity. EKG is negative  Will follow also is try to get fluid improved.  3 ASD.  Status post closure device.  4.  Hypertension diastolics will elevated.  Would continue to follow.  Again attempt diuresis  Further plans depend on lab results and her response.  To Lasix.   Current medicines are reviewed at length with the patient today.  The patient does not have concerns regarding medicines.  Signed, Dorris Carnes, MD  08/13/2020 1:32 PM    [facility] [place], [place], [place]  [address] Phone: [phone]; Fax: [phone]

## 2020-08-13 NOTE — Telephone Encounter (Signed)
Patient is calling in stating that she feels as though she is retaining fluid. She misplaced her diuretic so has not been taking it consistently but did take a dose yesterday. Patient does not have recent weight but states she can tell that she has gained. She reports abdominal and lower extremity swelling. Patient has been sleeping with her head elevated which helps. She reports a mild aching in her chest that comes and goes for the past week. Denies any chest pain or radiation of aching. She denies active SOB but reports extreme fatigue, states she has not felt well rested recently. She is unable to provide BP/HR at this time. Patient requesting to be seen in office to be evaluated. Appt made for this afternoon with Dr. Harrington Challenger. Patient agreeable and is aware of symptoms that would warrant a call to EMS.

## 2020-08-14 ENCOUNTER — Telehealth: Payer: Self-pay | Admitting: *Deleted

## 2020-08-14 DIAGNOSIS — Z79899 Other long term (current) drug therapy: Secondary | ICD-10-CM

## 2020-08-14 DIAGNOSIS — R609 Edema, unspecified: Secondary | ICD-10-CM

## 2020-08-14 MED ORDER — POTASSIUM CHLORIDE CRYS ER 20 MEQ PO TBCR: EXTENDED_RELEASE_TABLET | ORAL | 3 refills | Status: DC

## 2020-08-14 MED ORDER — FUROSEMIDE 80 MG PO TABS: 80.0000 mg | ORAL_TABLET | Freq: Every day | ORAL | 3 refills | Status: DC

## 2020-08-14 NOTE — Telephone Encounter (Signed)
-----   Message from Jeanann Lewandowsky, [place] sent at 08/14/2020  7:10 AM EST -----  ----- Message ----- From: Fay Records, MD Sent: 08/13/2020  10:13 PM EST To: Jettie Booze, MD, [facility] Triage  CBC is normal  NO evid of inflammaiton  Fluid and electrolytes are OK I would recomm that she increase lasix to 80 mg daily and 2 tablets K in AM and 1 in pm Plan for 1 week    REcheck BMET on Wed   next week    Email/call with response in LE edema

## 2020-08-14 NOTE — Telephone Encounter (Signed)
Patient notified. Prescription sent to [facility] on [address].  Patient will come in for lab work on 08/20/20

## 2020-08-19 ENCOUNTER — Encounter: Payer: Self-pay | Admitting: Orthopaedic Surgery

## 2020-08-19 ENCOUNTER — Ambulatory Visit: Payer: Self-pay

## 2020-08-19 ENCOUNTER — Ambulatory Visit (INDEPENDENT_AMBULATORY_CARE_PROVIDER_SITE_OTHER): Payer: 59 | Admitting: Orthopaedic Surgery

## 2020-08-19 DIAGNOSIS — M25562 Pain in left knee: Secondary | ICD-10-CM

## 2020-08-19 DIAGNOSIS — M7061 Trochanteric bursitis, right hip: Secondary | ICD-10-CM

## 2020-08-19 DIAGNOSIS — G8929 Other chronic pain: Secondary | ICD-10-CM | POA: Diagnosis not present

## 2020-08-19 MED ORDER — LIDOCAINE HCL 1 % IJ SOLN
3.0000 mL | INTRAMUSCULAR | Status: AC | PRN
  Administered 2020-08-19: 3 mL

## 2020-08-19 MED ORDER — METHYLPREDNISOLONE ACETATE 40 MG/ML IJ SUSP
40.0000 mg | INTRAMUSCULAR | Status: AC | PRN
  Administered 2020-08-19: 40 mg via INTRA_ARTICULAR

## 2020-08-19 NOTE — Addendum Note (Signed)
Addended by: Robyne Peers on: 08/19/2020 11:29 AM   Modules accepted: Orders

## 2020-08-19 NOTE — Progress Notes (Signed)
Office Visit Note   Patient: Jessica Davenport           Date of Birth: November 06, 1965           MRN: [id number] Visit Date: 08/19/2020              Requested by: Janith Lima, MD [address] [place],  [place] [address] PCP: Janith Lima, MD   Assessment & Plan: Visit Diagnoses:  1. Chronic pain of left knee   2. Trochanteric bursitis of right hip     Plan: We will send her to formal physical therapy for IT band stretching, quad strengthening, modalities and a home exercise program.  Questions were encouraged and answered at length.  Did recommend Voltaren gel up to 4 g 4 times daily to the left knee.  She also ask about natural anti-inflammatories she could begin taking turmeric and see if over the next 3 weeks it made a difference in her knee pain.  Questions were encouraged and answered at length by Dr. Ninfa Linden and myself.  Follow-Up Instructions: Return if symptoms worsen or fail to improve.   Orders:  Orders Placed This Encounter  Procedures  . Large Joint Inj  . XR HIP UNILAT W OR W/O PELVIS 2-3 VIEWS RIGHT  . XR Knee 1-2 Views Left   No orders of the defined types were placed in this encounter.     Procedures: Large Joint Inj: R greater trochanter on 08/19/2020 11:27 AM Indications: pain Details: 22 G 3.5 in needle, lateral approach  Arthrogram: No  Medications: 3 mL lidocaine 1 %; 40 mg methylPREDNISolone acetate 40 MG/ML Outcome: tolerated well, no immediate complications Procedure, treatment alternatives, risks and benefits explained, specific risks discussed. Consent was given by the patient. Immediately prior to procedure a time out was called to verify the correct patient, procedure, equipment, support staff and site/side marked as required. Patient was prepped and draped in the usual sterile fashion.       Clinical Data: No additional findings.   Subjective: Chief Complaint  Patient presents with  . Right Hip - Pain  . Left Knee - Pain     HPI [name] similarly seen in the past but is been several years comes in today as a new patient for left knee and right hip pain.  She states that she has had pain on and off for years in the left knee good over the last 2 weeks this became worse.  She does state that the knee occasionally gives way and some painful popping.  No new injury.  She is using Biofreeze on the knee and ice which helps some. Right hip pain over the last 2 weeks.  No known injury to the lateral aspect of the hip is worse especially when lying on the hip.  She had at least 2 episodes of groin pain.  She denies any numbness tingling down the right leg.  She does have chronic decreased sensation of the left leg which has been ongoing for years.  She relates this to degenerative disc disease of her lumbar spine.  She has some back pain from time to time.  She has had no treatment for the hip pain.  Patient is nondiabetic she does report that her glucose level at 1 1 office visit was high however she reports that her latest labs showed her glucose levels being normal time.  Review of Systems Denies any fevers, chills or recent vaccines.  Objective: Vital Signs:  LMP 11/10/2011   Physical Exam Constitutional:      Appearance: She is not ill-appearing or diaphoretic.  Pulmonary:     Effort: Pulmonary effort is normal.  Neurological:     Mental Status: She is alert and oriented to person, place, and time.  Psychiatric:        Mood and Affect: Mood normal.     Ortho Exam Bilateral hips good range of motion without pain.  She has tenderness over the trochanteric region of both hips.  Negative straight leg raise bilaterally. Bilateral knees she has slight hyperextension of both knees.  Normal flexion.  No instability valgus varus stressing of either knee.  Tenderness to the medial joint line left knee.  No abnormal warmth erythema or effusion of either knee. Specialty Comments:  No specialty comments  available.  Imaging: XR HIP UNILAT W OR W/O PELVIS 2-3 VIEWS RIGHT  Result Date: 08/19/2020 AP pelvis lateral view of the right hip: Hip joints are well preserved on AP view.  Lateral right hip shows a well-preserved hip joint.  No acute fractures identified.  No bony abnormalities.  Both hips are well located.  XR Knee 1-2 Views Left  Result Date: 08/19/2020 Left knee 2 views: Knee joint is overall well-preserved.  Mild patellofemoral spurring.  No acute fractures.  Knee is well located.    PMFS History: Patient Active Problem List   Diagnosis Date Noted  . Idiopathic intracranial hypertension 03/25/2020  . Need for hepatitis C screening test 02/26/2020  . Diuretic-induced hypokalemia 02/26/2020  . 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 Leukocytosis 01/16/2020  . Esophageal dysphagia 01/15/2020  . GAD (generalized anxiety disorder) 01/09/2020  . Morbid obesity with BMI of 45.0-49.9, adult ([place]) 02/23/2019  . Prediabetes 02/08/2019  . Routine general medical examination at a health care facility 02/08/2019  . Essential hypertension 01/08/2019  . Stasis dermatitis of both legs 01/16/2015  . ASD (atrial septal defect) 04/24/2013  . Constipation 06/16/2012  . Pure hypercholesterolemia 06/16/2012  . 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 Intrinsic eczema 01/08/2010  . Pulmonary HTN ([place]) 08/20/2009  . ALLERGIC RHINITIS 08/20/2009  . [place] DISEASE, LUMBAR 08/20/2009  . Vitamin D deficiency 08/19/2009  . Iron deficiency anemia 08/19/2009   Past Medical History:  Diagnosis Date  . Abscess   . Allergy   . Bilateral lower extremity edema 12/02/2016  . Essential hypertension   . Fibroid, uterine   . Infected sebaceous cyst    mons pubis  . Iron deficiency anemia 08/19/2009       . Lower extremity edema   . Lumbar disc disease   . Morbid obesity ([place])   . Obesity, Class III, BMI 40-49.9 (morbid obesity) ([place]) 02/15/2012  . Ostium secundum type atrial  septal defect    s/p 28 mm Amplatzer device closure in 2005  . Pulmonary HTN ([place]) 08/20/2009       . Pure hypercholesterolemia 06/16/2012  . Small bowel obstruction ([place]) 2013  . Vitamin D deficiency     Family History  Problem Relation Age of Onset  . Coronary artery disease Mother   . Hypertension Mother   . Diabetes Mother   . Breast cancer Mother   . Coronary artery disease Father   . Hypertension Father   . Diabetes Father   . Anxiety disorder Sister   . Ovarian cancer Maternal Aunt   . Breast cancer Maternal Aunt   . Colon cancer Maternal Uncle   . Cervical cancer  Maternal Aunt     Past Surgical History:  Procedure Laterality Date  . ABDOMINAL HYSTERECTOMY  12/07/11  . ASD REPAIR  2005/2009  . ENDOMETRIAL ABLATION  2010,2011,2012  . TUBAL LIGATION  1996  . uterine ablation     Social History   Occupational History  . Occupation: adult Firefighter: [employer]  Tobacco Use  . Smoking status: Never Smoker  . Smokeless tobacco: Never Used  Vaping Use  . Vaping Use: Never used  Substance and Sexual Activity  . Alcohol use: No  . Drug use: No  . Sexual activity: Yes    Birth control/protection: Surgical

## 2020-08-20 ENCOUNTER — Other Ambulatory Visit: Payer: 59

## 2020-08-28 ENCOUNTER — Encounter: Payer: Self-pay | Admitting: Rehabilitative and Restorative Service Providers"

## 2020-08-28 ENCOUNTER — Ambulatory Visit (INDEPENDENT_AMBULATORY_CARE_PROVIDER_SITE_OTHER): Payer: 59 | Admitting: Rehabilitative and Restorative Service Providers"

## 2020-08-28 ENCOUNTER — Other Ambulatory Visit: Payer: Self-pay

## 2020-08-28 DIAGNOSIS — M25562 Pain in left knee: Secondary | ICD-10-CM

## 2020-08-28 DIAGNOSIS — R600 Localized edema: Secondary | ICD-10-CM

## 2020-08-28 DIAGNOSIS — M25651 Stiffness of right hip, not elsewhere classified: Secondary | ICD-10-CM

## 2020-08-28 DIAGNOSIS — M25551 Pain in right hip: Secondary | ICD-10-CM | POA: Diagnosis not present

## 2020-08-28 DIAGNOSIS — R262 Difficulty in walking, not elsewhere classified: Secondary | ICD-10-CM

## 2020-08-28 DIAGNOSIS — M6281 Muscle weakness (generalized): Secondary | ICD-10-CM

## 2020-08-28 DIAGNOSIS — M25662 Stiffness of left knee, not elsewhere classified: Secondary | ICD-10-CM

## 2020-08-28 NOTE — Therapy (Signed)
[facility] Physical Therapy [address] [place], [place], [address] Phone: [phone]   Fax:  [phone]  Physical Therapy Evaluation  Patient Details  Name: Jessica Davenport MRN: [id number] Date of Birth: 1966-06-06 Referring Provider (PT): Pete Pelt PA-C   Encounter Date: 08/28/2020   PT End of Session - 08/28/20 1640    Visit Number 1    Number of Visits 16    PT Start Time 2774    PT Stop Time 1430    PT Time Calculation (min) 39 min    Activity Tolerance No increased pain;Patient tolerated treatment well;Patient limited by fatigue    Behavior During Therapy [facility] for tasks assessed/performed           Past Medical History:  Diagnosis Date  . Abscess   . Allergy   . Bilateral lower extremity edema 12/02/2016  . Essential hypertension   . Fibroid, uterine   . Infected sebaceous cyst    mons pubis  . Iron deficiency anemia 08/19/2009       . Lower extremity edema   . Lumbar disc disease   . Morbid obesity ([place])   . Obesity, Class III, BMI 40-49.9 (morbid obesity) ([place]) 02/15/2012  . Ostium secundum type atrial septal defect    s/p 28 mm Amplatzer device closure in 2005  . Pulmonary HTN ([place]) 08/20/2009       . Pure hypercholesterolemia 06/16/2012  . Small bowel obstruction ([place]) 2013  . Vitamin D deficiency     Past Surgical History:  Procedure Laterality Date  . ABDOMINAL HYSTERECTOMY  12/07/11  . ASD REPAIR  2005/2009  . ENDOMETRIAL ABLATION  2010,2011,2012  . TUBAL LIGATION  1996  . uterine ablation      There were no vitals filed for this visit.    Subjective Assessment - 08/28/20 1637    Subjective Nisha has a ~ 2 week history of R hip and L knee pain.  Both are limiting her ability to sleep, sit for prolonged periods and weight-bear (stand and walk).    Limitations Sitting;House hold activities;Standing;Walking    Patient Stated Goals Get rid of hip and knee pain.  Be able to sleep and stand/walk normally.    Currently  in Pain? Yes    Pain Score 4     Pain Location Other (Comment)   R hip and L knee equally aggravating   Pain Orientation Right;Left    Pain Descriptors / Indicators Aching;Sore;Tightness    Pain Type Acute pain    Pain Onset 1 to 4 weeks ago    Pain Frequency Intermittent    Aggravating Factors  Sleeping, standing and walking are worst    Effect of Pain on Daily Activities Limits most ADLs (sitting and driving too)              [facility] PT Assessment - 08/28/20 0001      Assessment   Medical Diagnosis L knee and R hip pain    Referring Provider (PT) Pete Pelt PA-C    Onset Date/Surgical Date --   ~2 weeks L knee thunk/instability and R hip rolling in bed     Balance Screen   Has the patient fallen in the past 6 months No    Has the patient had a decrease in activity level because of a fear of falling?  No    Is the patient reluctant to leave their home because of a fear of falling?  No  Home Environment   Additional Comments 3 stairs in and out      Prior Function   Level of Independence Independent      Cognition   Overall Cognitive Status Within Functional Limits for tasks assessed      Observation/Other Assessments   Focus on Therapeutic Outcomes (FOTO)  48 (Goal 55)      ROM / Strength   AROM / PROM / Strength AROM;Strength      AROM   Overall AROM  Deficits    AROM Assessment Site Knee;Hip    Right/Left Hip Left;Right    Right Hip Flexion 75    Right Hip External Rotation  16    Right Hip Internal Rotation  8    Left Hip Flexion 80    Left Hip External Rotation  14    Left Hip Internal Rotation  8    Right/Left Knee Left;Right    Right Knee Extension 0    Right Knee Flexion 110    Left Knee Extension 0    Left Knee Flexion 105      Strength   Overall Strength Deficits      Flexibility   Soft Tissue Assessment /Muscle Length yes    Hamstrings 45 degrees L and 40 degrees R                      Objective measurements completed  on examination: See above findings.       [facility] Adult PT Treatment/Exercise - 08/28/20 0001      Exercises   Exercises Knee/Hip      Knee/Hip Exercises: Stretches   Piriformis Stretch Both;4 reps;20 seconds   Supine figure 4 stretch     Knee/Hip Exercises: Standing   Other Standing Knee Exercises Trunk extension AROM 10X 3 seconds      Knee/Hip Exercises: Seated   Sit to Sand 10 reps;without UE support   slow eccentrics     Knee/Hip Exercises: Supine   Quad Sets Strengthening;Both;2 sets;10 reps;Other (comment)    Quad Sets Limitations 5 seconds supine B                  PT Education - 08/28/20 1640    Education Details Reviewed exam findings and her starter HEP.    Person(s) Educated Patient    Methods Explanation;Demonstration;Tactile cues;Verbal cues;Handout    Comprehension Verbal cues required;Need further instruction;Returned demonstration;Verbalized understanding;Tactile cues required            PT Short Term Goals - 08/28/20 1646      PT SHORT TERM GOAL #1   Title Tahiri will be independent and compliant with her starter HEP.    Time 4    Period Weeks    Status New    Target Date 09/19/20             PT Long Term Goals - 08/28/20 1646      PT LONG TERM GOAL #1   Title Improve FOTO score to 55.    Baseline 48    Time 8    Period Weeks    Status New    Target Date 10/24/20      PT LONG TERM GOAL #2   Title Lada will report R hip and L knee pain consistently 0-2/10 on the Numeric Pain Rating Scale.    Baseline Can be 5/10    Time 8    Period Weeks    Status New    Target  Date 10/24/20      PT LONG TERM GOAL #3   Title Mialani will be independent and compliant with her long-term maintenence HEP.    Time 8    Period Weeks    Status New    Target Date 10/24/20                  Plan - 08/28/20 1641    Clinical Impression Statement Aundra is limited in most ADLs by a R hip bursitis and L knee sprain/strain.  She has  very tight hip flexors and hip ER.  She has weak quadriceps and a high BMI.  Hip flexors stretching, stretching into hip ER, quadriceps strengthening and general conditioning should allow Chrysal to meet long-term goals in 4-8 weeks (depending on compliance with her HEP).    Personal Factors and Comorbidities Comorbidity 1    Comorbidities High BMI, prediabetes    Examination-Activity Limitations Sit;Sleep;Bed Mobility;Lift;Squat;Stairs;Carry;Stand    Examination-Participation Restrictions Occupation;Cleaning;Community Activity;Driving    Stability/Clinical Decision Making Stable/Uncomplicated    Clinical Decision Making Low    Rehab Potential Good    PT Frequency 2x / week    PT Duration 8 weeks    PT Treatment/Interventions ADLs/Self Care Home Management;Moist Heat;Cryotherapy;Therapeutic activities;Stair training;Gait training;Therapeutic exercise;Neuromuscular re-education;Patient/family education;Manual techniques;Vasopneumatic Device    PT Next Visit Plan Progress quadriceps strengthening, hip strengthening and general conditioning    PT Home Exercise Plan Access Code: [email or web address]    Consulted and Agree with Plan of Care Patient           Patient will benefit from skilled therapeutic intervention in order to improve the following deficits and impairments:  Abnormal gait,Decreased activity tolerance,Decreased endurance,Decreased range of motion,Decreased strength,Difficulty walking,Increased edema,Impaired flexibility,Obesity,Pain  Visit Diagnosis: Difficulty walking  Localized edema  Muscle weakness (generalized)  Pain in right hip  Stiffness of right hip, not elsewhere classified  Acute pain of left knee  Stiffness of left knee, not elsewhere classified     Problem List Patient Active Problem List   Diagnosis Date Noted  . Idiopathic intracranial hypertension 03/25/2020  . Need for hepatitis C screening test 02/26/2020  . Diuretic-induced hypokalemia 02/26/2020  .  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 Leukocytosis 01/16/2020  . Esophageal dysphagia 01/15/2020  . GAD (generalized anxiety disorder) 01/09/2020  . Morbid obesity with BMI of 45.0-49.9, adult ([place]) 02/23/2019  . Prediabetes 02/08/2019  . Routine general medical examination at a health care facility 02/08/2019  . Essential hypertension 01/08/2019  . Stasis dermatitis of both legs 01/16/2015  . ASD (atrial septal defect) 04/24/2013  . Constipation 06/16/2012  . Pure hypercholesterolemia 06/16/2012  . Intrinsic eczema 01/08/2010  . Pulmonary HTN ([place]) 08/20/2009  . ALLERGIC RHINITIS 08/20/2009  . [place] DISEASE, LUMBAR 08/20/2009  . Vitamin D deficiency 08/19/2009  . Iron deficiency anemia 08/19/2009    Farley Ly PT, MPT 08/28/2020, 4:50 PM  [facility] Physical Therapy [address] [place], [place], [address] Phone: [phone]   Fax:  [phone]  Name: TNYA ADES MRN: [id number] Date of Birth: Mar 01, 1966

## 2020-08-28 NOTE — Patient Instructions (Signed)
Access Code: [email or web address] URL: [email or web address] Date: 08/28/2020 Prepared by: Vista Mink  Exercises Supine Figure 4 Piriformis Stretch - 2 x daily - 7 x weekly - 1 sets - 5 reps - 20 seconds hold Supine Quadricep Sets - 2 x daily - 7 x weekly - 3-5 sets - 10 reps - 5 seconds hold Standing Lumbar Extension at [place] - 5 x daily - 7 x weekly - 1 sets - 5 reps - 3 seconds hold Sit to Stand with Armchair - 2 x daily - 7 x weekly - 1 sets - 5 reps

## 2020-09-10 ENCOUNTER — Encounter: Payer: Self-pay | Admitting: Rehabilitative and Restorative Service Providers"

## 2020-09-10 ENCOUNTER — Ambulatory Visit (INDEPENDENT_AMBULATORY_CARE_PROVIDER_SITE_OTHER): Payer: 59 | Admitting: Rehabilitative and Restorative Service Providers"

## 2020-09-10 ENCOUNTER — Other Ambulatory Visit: Payer: Self-pay

## 2020-09-10 DIAGNOSIS — M25562 Pain in left knee: Secondary | ICD-10-CM

## 2020-09-10 DIAGNOSIS — M25551 Pain in right hip: Secondary | ICD-10-CM

## 2020-09-10 DIAGNOSIS — M25651 Stiffness of right hip, not elsewhere classified: Secondary | ICD-10-CM

## 2020-09-10 DIAGNOSIS — R600 Localized edema: Secondary | ICD-10-CM

## 2020-09-10 DIAGNOSIS — R262 Difficulty in walking, not elsewhere classified: Secondary | ICD-10-CM | POA: Diagnosis not present

## 2020-09-10 DIAGNOSIS — M6281 Muscle weakness (generalized): Secondary | ICD-10-CM

## 2020-09-10 DIAGNOSIS — M25662 Stiffness of left knee, not elsewhere classified: Secondary | ICD-10-CM

## 2020-09-10 NOTE — Therapy (Signed)
[facility] Physical Therapy [address] [place], [place], [address] Phone: [phone]   Fax:  [phone]  Physical Therapy Treatment  Patient Details  Name: Jessica Davenport MRN: [id number] Date of Birth: Dec 17, 1965 Referring Provider (PT): Pete Pelt PA-C   Encounter Date: 09/10/2020   PT End of Session - 09/10/20 1225    Visit Number 2    Number of Visits 16    PT Start Time 1140    PT Stop Time 1229    PT Time Calculation (min) 49 min    Activity Tolerance No increased pain;Patient tolerated treatment well;Patient limited by fatigue    Behavior During Therapy [facility] for tasks assessed/performed           Past Medical History:  Diagnosis Date  . Abscess   . Allergy   . Bilateral lower extremity edema 12/02/2016  . Essential hypertension   . Fibroid, uterine   . Infected sebaceous cyst    mons pubis  . Iron deficiency anemia 08/19/2009       . Lower extremity edema   . Lumbar disc disease   . Morbid obesity ([name])   . Obesity, Class III, BMI 40-49.9 (morbid obesity) ([place]) 02/15/2012  . Ostium secundum type atrial septal defect    s/p 28 mm Amplatzer device closure in 2005  . Pulmonary HTN ([place]) 08/20/2009       . Pure hypercholesterolemia 06/16/2012  . Small bowel obstruction ([place]) 2013  . Vitamin D deficiency     Past Surgical History:  Procedure Laterality Date  . ABDOMINAL HYSTERECTOMY  12/07/11  . ASD REPAIR  2005/2009  . ENDOMETRIAL ABLATION  2010,2011,2012  . TUBAL LIGATION  1996  . uterine ablation      There were no vitals filed for this visit.   Subjective Assessment - 09/10/20 1211    Subjective Jessica Davenport reports inconsistent HEP compliance.    Limitations Sitting;House hold activities;Standing;Walking    How long can you sit comfortably? A few hours (2)    How long can you stand comfortably? ~ 30 minutes    How long can you walk comfortably? ~ 2 miles    Patient Stated Goals Get rid of hip and knee pain.  Be able to  sleep and stand/walk normally.    Currently in Pain? Yes    Pain Score 4     Pain Location Other (Comment)   L knee and R hip   Pain Orientation Left;Right    Pain Descriptors / Indicators Aching;Sore;Tightness    Pain Type Acute pain    Pain Onset 1 to 4 weeks ago    Pain Frequency Intermittent    Aggravating Factors  Sleeping (although better), standing and walking are affected    Effect of Pain on Daily Activities Limits WB function                             [facility] Adult PT Treatment/Exercise - 09/10/20 0001      Exercises   Exercises Knee/Hip      Knee/Hip Exercises: Stretches   Piriformis Stretch Both;4 reps;20 seconds   Supine figure 4 stretch     Knee/Hip Exercises: Aerobic   Recumbent Bike Seat 7 for 8 minutes      Knee/Hip Exercises: Machines for Strengthening   Cybex Leg Press 125# Double leg slow eccentrics 2 sets 20X & single leg (B) 15X 50#      Knee/Hip Exercises: Standing  Other Standing Knee Exercises Trunk extension AROM 10X 3 seconds      Knee/Hip Exercises: Seated   Sit to Sand 10 reps;without UE support   slow eccentrics     Knee/Hip Exercises: Supine   Quad Sets Strengthening;Both;2 sets;10 reps;Other (comment)    Quad Sets Limitations 5 seconds supine B                  PT Education - 09/10/20 1223    Education Details Discussed using the leg press and recumbent bike at the gym.  Otherwise, continue HEP.    Person(s) Educated Patient    Methods Explanation;Demonstration;Tactile cues;Verbal cues    Comprehension Verbalized understanding;Returned demonstration;Verbal cues required;Need further instruction;Tactile cues required            PT Short Term Goals - 09/10/20 1224      PT SHORT TERM GOAL #1   Title Jessica Davenport will be independent and compliant with her starter HEP.    Time 4    Period Weeks    Status On-going    Target Date 09/19/20             PT Long Term Goals - 09/10/20 1224      PT LONG TERM  GOAL #1   Title Improve FOTO score to 55.    Baseline 48    Time 8    Period Weeks    Status On-going      PT LONG TERM GOAL #2   Title Jessica Davenport will report R hip and L knee pain consistently 0-2/10 on the Numeric Pain Rating Scale.    Baseline Can be 5/10    Time 8    Period Weeks    Status On-going      PT LONG TERM GOAL #3   Title Jessica Davenport will be independent and compliant with her long-term maintenence HEP.    Time 8    Period Weeks    Status On-going                 Plan - 09/10/20 1225    Clinical Impression Statement Jessica Davenport reports "fair" HEP compliance.  I enphasized the importance of consistent compliance to meet long-term goals.  Avoiding knee hyperextension, strengthening her legs (quadriceps emphasis) and improving flexibility will help Jessica Davenport meet LTGs established at evaluation.    Personal Factors and Comorbidities Comorbidity 1    Comorbidities High BMI, prediabetes    Examination-Activity Limitations Sit;Sleep;Bed Mobility;Lift;Squat;Stairs;Carry;Stand    Examination-Participation Restrictions Occupation;Cleaning;Community Activity;Driving    Stability/Clinical Decision Making Stable/Uncomplicated    Rehab Potential Good    PT Frequency 2x / week    PT Duration 8 weeks    PT Treatment/Interventions ADLs/Self Care Home Management;Moist Heat;Cryotherapy;Therapeutic activities;Stair training;Gait training;Therapeutic exercise;Neuromuscular re-education;Patient/family education;Manual techniques;Vasopneumatic Device    PT Next Visit Plan Progress quadriceps strengthening, hip strengthening and general conditioning    PT Home Exercise Plan Access Code: [email or web address]    Consulted and Agree with Plan of Care Patient           Patient will benefit from skilled therapeutic intervention in order to improve the following deficits and impairments:  Abnormal gait,Decreased activity tolerance,Decreased endurance,Decreased range of motion,Decreased strength,Difficulty  walking,Increased edema,Impaired flexibility,Obesity,Pain  Visit Diagnosis: Difficulty walking  Localized edema  Muscle weakness (generalized)  Pain in right hip  Stiffness of right hip, not elsewhere classified  Acute pain of left knee  Stiffness of left knee, not elsewhere classified     Problem List Patient Active Problem List  Diagnosis Date Noted  . Idiopathic intracranial hypertension 03/25/2020  . Need for hepatitis C screening test 02/26/2020  . Diuretic-induced hypokalemia 02/26/2020  . 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 Leukocytosis 01/16/2020  . Esophageal dysphagia 01/15/2020  . GAD (generalized anxiety disorder) 01/09/2020  . Morbid obesity with BMI of 45.0-49.9, adult ([place]) 02/23/2019  . Prediabetes 02/08/2019  . Routine general medical examination at a health care facility 02/08/2019  . Essential hypertension 01/08/2019  . Stasis dermatitis of both legs 01/16/2015  . ASD (atrial septal defect) 04/24/2013  . Constipation 06/16/2012  . Pure hypercholesterolemia 06/16/2012  . Intrinsic eczema 01/08/2010  . Pulmonary HTN ([place]) 08/20/2009  . ALLERGIC RHINITIS 08/20/2009  . [place] DISEASE, LUMBAR 08/20/2009  . Vitamin D deficiency 08/19/2009  . Iron deficiency anemia 08/19/2009    Farley Ly PT, MPT 09/10/2020, 12:29 PM  [facility] Physical Therapy [address] [place], [place], [address] Phone: [phone]   Fax:  [phone]  Name: Jessica Davenport MRN: [id number] Date of Birth: [birth year]

## 2020-09-12 ENCOUNTER — Other Ambulatory Visit: Payer: Self-pay

## 2020-09-12 ENCOUNTER — Ambulatory Visit: Payer: 59 | Admitting: Rehabilitative and Restorative Service Providers"

## 2020-09-12 ENCOUNTER — Encounter: Payer: Self-pay | Admitting: Rehabilitative and Restorative Service Providers"

## 2020-09-12 DIAGNOSIS — R262 Difficulty in walking, not elsewhere classified: Secondary | ICD-10-CM | POA: Diagnosis not present

## 2020-09-12 DIAGNOSIS — M25651 Stiffness of right hip, not elsewhere classified: Secondary | ICD-10-CM

## 2020-09-12 DIAGNOSIS — M25551 Pain in right hip: Secondary | ICD-10-CM

## 2020-09-12 DIAGNOSIS — R600 Localized edema: Secondary | ICD-10-CM

## 2020-09-12 DIAGNOSIS — M6281 Muscle weakness (generalized): Secondary | ICD-10-CM

## 2020-09-12 DIAGNOSIS — M25562 Pain in left knee: Secondary | ICD-10-CM

## 2020-09-12 DIAGNOSIS — M25662 Stiffness of left knee, not elsewhere classified: Secondary | ICD-10-CM

## 2020-09-12 NOTE — Therapy (Signed)
[facility] Physical Therapy [address] [place], [place], [address] Phone: [phone]   Fax:  [phone]  Physical Therapy Treatment  Patient Details  Name: Jessica Davenport MRN: [id number] Date of Birth: August 07, 1965 Referring Provider (PT): Pete Pelt PA-C   Encounter Date: 09/12/2020   PT End of Session - 09/12/20 1422    Visit Number 3    Number of Visits 16    PT Start Time 9528    PT Stop Time 1430    PT Time Calculation (min) 45 min    Activity Tolerance No increased pain;Patient tolerated treatment well;Patient limited by fatigue    Behavior During Therapy [facility] for tasks assessed/performed           Past Medical History:  Diagnosis Date  . Abscess   . Allergy   . Bilateral lower extremity edema 12/02/2016  . Essential hypertension   . Fibroid, uterine   . Infected sebaceous cyst    mons pubis  . Iron deficiency anemia 08/19/2009       . Lower extremity edema   . Lumbar disc disease   . Morbid obesity ([place])   . Obesity, Class III, BMI 40-49.9 (morbid obesity) ([place]) 02/15/2012  . Ostium secundum type atrial septal defect    s/p 28 mm Amplatzer device closure in 2005  . Pulmonary HTN ([place]) 08/20/2009       . Pure hypercholesterolemia 06/16/2012  . Small bowel obstruction ([place]) 2013  . Vitamin D deficiency     Past Surgical History:  Procedure Laterality Date  . ABDOMINAL HYSTERECTOMY  12/07/11  . ASD REPAIR  2005/2009  . ENDOMETRIAL ABLATION  2010,2011,2012  . TUBAL LIGATION  1996  . uterine ablation      There were no vitals filed for this visit.   Subjective Assessment - 09/12/20 1401    Subjective Jessica Davenport reports inconsistent HEP compliance.  She is sore from visit 1.    Limitations Sitting;House hold activities;Standing;Walking    How long can you sit comfortably? A few hours (2)    How long can you stand comfortably? ~ 30 minutes    How long can you walk comfortably? ~ 2 miles    Patient Stated Goals Get rid of hip and  knee pain.  Be able to sleep and stand/walk normally.    Currently in Pain? Yes    Pain Score 4     Pain Location Other (Comment)   Thighs and gluteals (muscles, not joint)   Pain Orientation Left;Right    Pain Descriptors / Indicators Aching;Sore;Tightness    Pain Type Acute pain    Pain Onset More than a month ago    Pain Frequency Intermittent    Aggravating Factors  Sleeping, standing and walking    Effect of Pain on Daily Activities Limits WB function                             [facility] Adult PT Treatment/Exercise - 09/12/20 0001      Exercises   Exercises Knee/Hip      Knee/Hip Exercises: Stretches   Piriformis Stretch Both;4 reps;20 seconds   Supine figure 4 stretch     Knee/Hip Exercises: Aerobic   Recumbent Bike Seat 7 for 8 minutes      Knee/Hip Exercises: Machines for Strengthening   Cybex Leg Press 100# Double leg slow eccentrics 2 sets 20X & single leg (B) 15X 37#      Knee/Hip  Exercises: Standing   Other Standing Knee Exercises Trunk extension AROM 10X 3 seconds      Knee/Hip Exercises: Seated   Sit to Sand 10 reps;without UE support   slow eccentrics     Knee/Hip Exercises: Supine   Quad Sets Strengthening;Both;2 sets;10 reps;Other (comment)    Quad Sets Limitations 5 seconds supine                  PT Education - 09/12/20 1405    Education Details Discussed the importance of consistent HEP compliance.  Reviewed HEP with modifications to decrease soreness.    Person(s) Educated Patient    Methods Explanation;Demonstration;Tactile cues;Verbal cues    Comprehension Verbal cues required;Returned demonstration;Need further instruction;Tactile cues required;Verbalized understanding            PT Short Term Goals - 09/12/20 1421      PT SHORT TERM GOAL #1   Title Jessica Davenport will be independent and compliant with her starter HEP.    Time 4    Period Weeks    Status On-going    Target Date 09/19/20             PT Long Term  Goals - 09/12/20 1422      PT LONG TERM GOAL #1   Title Improve FOTO score to 55.    Baseline 48    Time 8    Period Weeks    Status On-going      PT LONG TERM GOAL #2   Title Jessica Davenport will report R hip and L knee pain consistently 0-2/10 on the Numeric Pain Rating Scale.    Baseline Can be 5/10    Time 8    Period Weeks    Status On-going      PT LONG TERM GOAL #3   Title Jessica Davenport will be independent and compliant with her long-term maintenence HEP.    Time 8    Period Weeks    Status On-going                 Plan - 09/12/20 1423    Clinical Impression Statement Jessica Davenport was very sore from her last visit.  Quadriceps and gluteal soreness was the majority.  This reinforces the importance of increasing shock absorbing muscle strength to decrease knee and hip pain and joint compressive forces.    Personal Factors and Comorbidities Comorbidity 1    Comorbidities High BMI, prediabetes    Examination-Activity Limitations Sit;Sleep;Bed Mobility;Lift;Squat;Stairs;Carry;Stand    Examination-Participation Restrictions Occupation;Cleaning;Community Activity;Driving    Stability/Clinical Decision Making Stable/Uncomplicated    Rehab Potential Good    PT Frequency 2x / week    PT Duration 8 weeks    PT Treatment/Interventions ADLs/Self Care Home Management;Moist Heat;Cryotherapy;Therapeutic activities;Stair training;Gait training;Therapeutic exercise;Neuromuscular re-education;Patient/family education;Manual techniques;Vasopneumatic Device    PT Next Visit Plan Progress quadriceps strengthening, hip strengthening and general conditioning    PT Home Exercise Plan Access Code: [email or web address]    Consulted and Agree with Plan of Care Patient           Patient will benefit from skilled therapeutic intervention in order to improve the following deficits and impairments:  Abnormal gait,Decreased activity tolerance,Decreased endurance,Decreased range of motion,Decreased strength,Difficulty  walking,Increased edema,Impaired flexibility,Obesity,Pain  Visit Diagnosis: Difficulty walking  Localized edema  Muscle weakness (generalized)  Pain in right hip  Stiffness of right hip, not elsewhere classified  Acute pain of left knee  Stiffness of left knee, not elsewhere classified     Problem List Patient Active  Problem List   Diagnosis Date Noted  . Idiopathic intracranial hypertension 03/25/2020  . Need for hepatitis C screening test 02/26/2020  . Diuretic-induced hypokalemia 02/26/2020  . 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 Leukocytosis 01/16/2020  . Esophageal dysphagia 01/15/2020  . GAD (generalized anxiety disorder) 01/09/2020  . Morbid obesity with BMI of 45.0-49.9, adult ([place]) 02/23/2019  . Prediabetes 02/08/2019  . Routine general medical examination at a health care facility 02/08/2019  . Essential hypertension 01/08/2019  . Stasis dermatitis of both legs 01/16/2015  . ASD (atrial septal defect) 04/24/2013  . Constipation 06/16/2012  . Pure hypercholesterolemia 06/16/2012  . Intrinsic eczema 01/08/2010  . Pulmonary HTN ([place]) 08/20/2009  . ALLERGIC RHINITIS 08/20/2009  . [place] DISEASE, LUMBAR 08/20/2009  . Vitamin D deficiency 08/19/2009  . Iron deficiency anemia 08/19/2009    Farley Ly PT, MPT 09/12/2020, 2:30 PM  [facility] Physical Therapy [address] [place], [place], [address] Phone: [phone]   Fax:  [phone]  Name: Jessica Davenport MRN: [id number] Date of Birth: 1966/05/22

## 2020-09-16 ENCOUNTER — Ambulatory Visit (INDEPENDENT_AMBULATORY_CARE_PROVIDER_SITE_OTHER): Payer: 59 | Admitting: Physical Therapy

## 2020-09-16 ENCOUNTER — Other Ambulatory Visit: Payer: Self-pay

## 2020-09-16 ENCOUNTER — Encounter: Payer: Self-pay | Admitting: Physical Therapy

## 2020-09-16 DIAGNOSIS — M25562 Pain in left knee: Secondary | ICD-10-CM

## 2020-09-16 DIAGNOSIS — M25551 Pain in right hip: Secondary | ICD-10-CM | POA: Diagnosis not present

## 2020-09-16 DIAGNOSIS — R600 Localized edema: Secondary | ICD-10-CM

## 2020-09-16 DIAGNOSIS — M25662 Stiffness of left knee, not elsewhere classified: Secondary | ICD-10-CM

## 2020-09-16 DIAGNOSIS — R262 Difficulty in walking, not elsewhere classified: Secondary | ICD-10-CM | POA: Diagnosis not present

## 2020-09-16 DIAGNOSIS — M6281 Muscle weakness (generalized): Secondary | ICD-10-CM

## 2020-09-16 DIAGNOSIS — M25651 Stiffness of right hip, not elsewhere classified: Secondary | ICD-10-CM

## 2020-09-16 NOTE — Therapy (Signed)
[facility] [address] [place], [place], [address] Phone: [phone]   Fax:  [phone]  Physical Therapy Treatment  Patient Details  Name: Jessica Davenport MRN: [id number] Date of Birth: [birth year] Referring Provider (PT): Pete Pelt PA-C   Encounter Date: 09/16/2020   PT End of Session - 09/16/20 1431    Visit Number 4    Number of Visits 16    PT Start Time 1779    PT Stop Time 1430    PT Time Calculation (min) 45 min    Activity Tolerance No increased pain;Patient tolerated treatment well;Patient limited by fatigue    Behavior During Therapy [facility] for tasks assessed/performed           Past Medical History:  Diagnosis Date  . Abscess   . Allergy   . Bilateral lower extremity edema 12/02/2016  . Essential hypertension   . Fibroid, uterine   . Infected sebaceous cyst    mons pubis  . Iron deficiency anemia 08/19/2009       . Lower extremity edema   . Lumbar disc disease   . Morbid obesity ([place])   . Obesity, Class III, BMI 40-49.9 (morbid obesity) ([place]) 02/15/2012  . Ostium secundum type atrial septal defect    s/p 28 mm Amplatzer device closure in 2005  . Pulmonary HTN ([place]) 08/20/2009       . Pure hypercholesterolemia 06/16/2012  . Small bowel obstruction ([place]) 2013  . Vitamin D deficiency     Past Surgical History:  Procedure Laterality Date  . ABDOMINAL HYSTERECTOMY  12/07/11  . ASD REPAIR  2005/2009  . ENDOMETRIAL ABLATION  2010,2011,2012  . TUBAL LIGATION  1996  . uterine ablation      There were no vitals filed for this visit.   Subjective Assessment - 09/16/20 1423    Subjective Pt arriving today with 3/10 pain in her left knee. Pt reporting,"it's giving me fit"    Limitations Sitting;House hold activities;Standing;Walking    How long can you sit comfortably? A few hours (2)    Patient Stated Goals Get rid of hip and knee pain.  Be able to sleep and stand/walk normally.    Currently in Pain? Yes    Pain  Score 3     Pain Location Knee    Pain Orientation Left    Pain Descriptors / Indicators Aching;Sore    Pain Type Acute pain    Pain Onset More than a month ago                             [facility] Adult PT Treatment/Exercise - 09/16/20 0001      Exercises   Exercises Knee/Hip      Knee/Hip Exercises: Stretches   Piriformis Stretch Both;4 reps;20 seconds   Supine figure 4 stretch   Piriformis Stretch Limitations figure 4 piriformis stretch x 3 holding 20 seconds      Knee/Hip Exercises: Aerobic   Recumbent Bike Seat 7 for 8 minutes, L3      Knee/Hip Exercises: Machines for Strengthening   Cybex Leg Press 100# Double leg slow eccentrics 2 sets 20X & single leg (B) 15X 37#      Knee/Hip Exercises: Standing   Terminal Knee Extension Strengthening;Left;2 sets;10 reps;Theraband    Theraband Level (Terminal Knee Extension) Level 2 (Red)    Rocker Board 2 minutes    Rocker Board Limitations UE support    Other Standing  Knee Exercises Pt reporting she has been doing this several times each day while at work      Knee/Hip Exercises: Seated   Sit to [employer] 10 reps;without UE support   slow eccentrics     Knee/Hip Exercises: Supine   Quad Sets Strengthening;Both;2 sets;10 reps;Other (comment)    Quad Sets Limitations holding 5 seconds with ankle on yellow physioball    Bridges Strengthening;AROM;Both;2 sets;10 reps;Limitations    Bridges Limitations holding 3  seconds    Other Supine Knee/Hip Exercises trunk rotation x 2 to each side holding 10 seconds                  PT Education - 09/16/20 1613    Education Details Exercise technique and eccentric control of all movements with core activation    Person(s) Educated Patient    Methods Explanation    Comprehension Verbalized understanding            PT Short Term Goals - 09/16/20 1617      PT SHORT TERM GOAL #1   Title Jessica Davenport will be independent and compliant with her starter HEP.    Status  On-going             PT Long Term Goals - 09/16/20 1617      PT LONG TERM GOAL #1   Title Improve FOTO score to 55.    Status On-going      PT LONG TERM GOAL #2   Title Jessica Davenport will report R hip and L knee pain consistently 0-2/10 on the Numeric Pain Rating Scale.    Status On-going      PT LONG TERM GOAL #3   Title Jessica Davenport will be independent and compliant with her long-term maintenence HEP.    Status On-going                 Plan - 09/16/20 1612    Clinical Impression Statement Pt arriving today reporting 3/10 pain in her left knee. Pt tolerating exercises well with instructions for eccentric control during activities. Pt also instructed in importance of core activation. Continue skilled PT progressing toward pts LTG's.    Personal Factors and Comorbidities Comorbidity 1    Comorbidities High BMI, prediabetes    Examination-Activity Limitations Sit;Sleep;Bed Mobility;Lift;Squat;Stairs;Carry;Stand    Examination-Participation Restrictions Occupation;Cleaning;Community Activity;Driving    Stability/Clinical Decision Making Stable/Uncomplicated    Rehab Potential Good    PT Frequency 2x / week    PT Duration 8 weeks    PT Treatment/Interventions ADLs/Self Care Home Management;Moist Heat;Cryotherapy;Therapeutic activities;Stair training;Gait training;Therapeutic exercise;Neuromuscular re-education;Patient/family education;Manual techniques;Vasopneumatic Device    PT Next Visit Plan Progress quadriceps strengthening, hip strengthening and general conditioning    PT Home Exercise Plan Access Code: [email or web address]    Consulted and Agree with Plan of Care Patient           Patient will benefit from skilled therapeutic intervention in order to improve the following deficits and impairments:  Abnormal gait,Decreased activity tolerance,Decreased endurance,Decreased range of motion,Decreased strength,Difficulty walking,Increased edema,Impaired flexibility,Obesity,Pain  Visit  Diagnosis: Difficulty walking  Localized edema  Muscle weakness (generalized)  Pain in right hip  Stiffness of right hip, not elsewhere classified  Acute pain of left knee  Stiffness of left knee, not elsewhere classified     Problem List Patient Active Problem List   Diagnosis Date Noted  . Idiopathic intracranial hypertension 03/25/2020  . Need for hepatitis C screening test 02/26/2020  . Diuretic-induced hypokalemia 02/26/2020  . Irritable bowel syndrome  with diarrhea 02/26/2020  . Leukocytosis 01/16/2020  . Esophageal dysphagia 01/15/2020  . GAD (generalized anxiety disorder) 01/09/2020  . Morbid obesity with BMI of 45.0-49.9, adult ([place]) 02/23/2019  . Prediabetes 02/08/2019  . Routine general medical examination at a health care facility 02/08/2019  . Essential hypertension 01/08/2019  . Stasis dermatitis of both legs 01/16/2015  . ASD (atrial septal defect) 04/24/2013  . Constipation 06/16/2012  . Pure hypercholesterolemia 06/16/2012  . Intrinsic eczema 01/08/2010  . Pulmonary HTN ([place]) 08/20/2009  . ALLERGIC RHINITIS 08/20/2009  . [place] DISEASE, LUMBAR 08/20/2009  . Vitamin D deficiency 08/19/2009  . Iron deficiency anemia 08/19/2009    Jessica Davenport 09/16/2020, [facility] [address] [place], [place], [address] Phone: [phone]   Fax:  [phone]  Name: Jessica Davenport MRN: [id number] Date of Birth: [birth year]

## 2020-09-18 ENCOUNTER — Ambulatory Visit (INDEPENDENT_AMBULATORY_CARE_PROVIDER_SITE_OTHER): Payer: 59 | Admitting: Rehabilitative and Restorative Service Providers"

## 2020-09-18 ENCOUNTER — Encounter: Payer: Self-pay | Admitting: Rehabilitative and Restorative Service Providers"

## 2020-09-18 ENCOUNTER — Other Ambulatory Visit: Payer: Self-pay

## 2020-09-18 DIAGNOSIS — M6281 Muscle weakness (generalized): Secondary | ICD-10-CM | POA: Diagnosis not present

## 2020-09-18 DIAGNOSIS — M25551 Pain in right hip: Secondary | ICD-10-CM

## 2020-09-18 DIAGNOSIS — R600 Localized edema: Secondary | ICD-10-CM

## 2020-09-18 DIAGNOSIS — R262 Difficulty in walking, not elsewhere classified: Secondary | ICD-10-CM | POA: Diagnosis not present

## 2020-09-18 DIAGNOSIS — M25562 Pain in left knee: Secondary | ICD-10-CM

## 2020-09-18 DIAGNOSIS — M25651 Stiffness of right hip, not elsewhere classified: Secondary | ICD-10-CM

## 2020-09-18 DIAGNOSIS — M25662 Stiffness of left knee, not elsewhere classified: Secondary | ICD-10-CM

## 2020-09-18 NOTE — Therapy (Signed)
[facility] [address] [place], [place], [address] Phone: [phone]   Fax:  [phone]  Physical Therapy Treatment  Patient Details  Name: Jessica Davenport MRN: [id number] Date of Birth: 01/14/1966 Referring Provider (PT): Pete Pelt PA-C   Encounter Date: 09/18/2020   PT End of Session - 09/18/20 1507    Visit Number 5    Number of Visits 16    PT Start Time 1430    PT Stop Time 1510    PT Time Calculation (min) 40 min    Activity Tolerance No increased pain;Patient tolerated treatment well;Patient limited by fatigue    Behavior During Therapy [facility] for tasks assessed/performed           Past Medical History:  Diagnosis Date  . Abscess   . Allergy   . Bilateral lower extremity edema 12/02/2016  . Essential hypertension   . Fibroid, uterine   . Infected sebaceous cyst    mons pubis  . Iron deficiency anemia 08/19/2009       . Lower extremity edema   . Lumbar disc disease   . Morbid obesity ([place])   . Obesity, Class III, BMI 40-49.9 (morbid obesity) ([place]) 02/15/2012  . Ostium secundum type atrial septal defect    s/p 28 mm Amplatzer device closure in 2005  . Pulmonary HTN ([place]) 08/20/2009       . Pure hypercholesterolemia 06/16/2012  . Small bowel obstruction ([place]) 2013  . Vitamin D deficiency     Past Surgical History:  Procedure Laterality Date  . ABDOMINAL HYSTERECTOMY  12/07/11  . ASD REPAIR  2005/2009  . ENDOMETRIAL ABLATION  2010,2011,2012  . TUBAL LIGATION  1996  . uterine ablation      There were no vitals filed for this visit.   Subjective Assessment - 09/18/20 1440    Subjective Arnika reports 30 quadriceps sets today (100/day recommended).  She has been avoiding stairs at work.    Limitations Sitting;House hold activities;Standing;Walking    How long can you sit comfortably? A few hours (2)    How long can you stand comfortably? ~ 30 minutes    How long can you walk comfortably? ~ 2 miles    Patient  Stated Goals Get rid of hip and knee pain.  Be able to sleep and stand/walk normally.    Currently in Pain? Yes    Pain Score 3     Pain Location Knee    Pain Orientation Left    Pain Descriptors / Indicators Aching;Sore    Pain Type Acute pain    Pain Onset More than a month ago    Pain Frequency Intermittent    Aggravating Factors  Sleeping (slightly at better), standing and walking    Effect of Pain on Daily Activities Limits WB function including stairs                             [facility] Adult PT Treatment/Exercise - 09/18/20 0001      Exercises   Exercises Knee/Hip      Knee/Hip Exercises: Stretches   Piriformis Stretch Both;4 reps;20 seconds   Supine figure 4 stretch     Knee/Hip Exercises: Aerobic   Recumbent Bike Seat 7 for 8 minutes, L3      Knee/Hip Exercises: Machines for Strengthening   Cybex Leg Press 125# double leg press with slow eccentrics 2 sets of 20      Knee/Hip Exercises:  Standing   Other Standing Knee Exercises Trunk extension AROM 10X 3 seconds      Knee/Hip Exercises: Seated   Sit to Sand 10 reps;without UE support   slow eccentrics     Knee/Hip Exercises: Supine   Quad Sets Strengthening;Both;2 sets;10 reps;Other (comment)    Quad Sets Limitations 5 seconds supine    Bridges Strengthening;Both;10 reps;Other (comment)   5 seconds   Bridges Limitations 5 seconds    Other Supine Knee/Hip Exercises Gluteal stretch (knee to opposite shoulder with strap-other leg straight) 4X 20 seconds                  PT Education - 09/18/20 1444    Education Details Reinforced the importance of HEP compliance per PT recommendations (sets and total reps).    Person(s) Educated Patient    Methods Explanation;Tactile cues;Verbal cues    Comprehension Verbal cues required;Returned demonstration;Need further instruction;Verbalized understanding;Tactile cues required            PT Short Term Goals - 09/18/20 1506      PT SHORT TERM GOAL  #1   Title Zamyiah will be independent and compliant with her starter HEP.    Status On-going             PT Long Term Goals - 09/18/20 1507      PT LONG TERM GOAL #1   Title Improve FOTO score to 55.    Baseline 48    Time 8    Status On-going    Target Date 10/24/20      PT LONG TERM GOAL #2   Title Emylee will report R hip and L knee pain consistently 0-2/10 on the Numeric Pain Rating Scale.    Baseline Can be 5/10    Time 8    Period Weeks    Status On-going    Target Date 10/24/20      PT LONG TERM GOAL #3   Title Leahann will be independent and compliant with her long-term maintenence HEP.    Time 8    Period Weeks    Status On-going    Target Date 10/24/20                 Plan - 09/18/20 1508    Clinical Impression Statement Samaya is not limited by her knee at night, although her R hip does affect sleep.  She has been consistent with her home exercises although increased quadriceps sets were recommended today.  We added an additional hip stretch and quadriceps, hip and low back strengthening activity.  Her prognosis to meet long-term goals is good with continued work.    Personal Factors and Comorbidities Comorbidity 1    Comorbidities High BMI, prediabetes    Examination-Activity Limitations Sit;Sleep;Bed Mobility;Lift;Squat;Stairs;Carry;Stand    Examination-Participation Restrictions Occupation;Cleaning;Community Activity;Driving    Stability/Clinical Decision Making Stable/Uncomplicated    Rehab Potential Good    PT Frequency 2x / week    PT Duration 8 weeks    PT Treatment/Interventions ADLs/Self Care Home Management;Moist Heat;Cryotherapy;Therapeutic activities;Stair training;Gait training;Therapeutic exercise;Neuromuscular re-education;Patient/family education;Manual techniques;Vasopneumatic Device    PT Next Visit Plan Progress quadriceps strengthening, hip strengthening and general conditioning    PT Home Exercise Plan Access Code: [email or web address]     Consulted and Agree with Plan of Care Patient           Patient will benefit from skilled therapeutic intervention in order to improve the following deficits and impairments:  Abnormal gait,Decreased activity tolerance,Decreased endurance,Decreased range  of motion,Decreased strength,Difficulty walking,Increased edema,Impaired flexibility,Obesity,Pain  Visit Diagnosis: Difficulty walking  Localized edema  Muscle weakness (generalized)  Pain in right hip  Stiffness of right hip, not elsewhere classified  Acute pain of left knee  Stiffness of left knee, not elsewhere classified     Problem List Patient Active Problem List   Diagnosis Date Noted  . Idiopathic intracranial hypertension 03/25/2020  . Need for hepatitis C screening test 02/26/2020  . Diuretic-induced hypokalemia 02/26/2020  . 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 Leukocytosis 01/16/2020  . Esophageal dysphagia 01/15/2020  . GAD (generalized anxiety disorder) 01/09/2020  . Morbid obesity with BMI of 45.0-49.9, adult ([place]) 02/23/2019  . Prediabetes 02/08/2019  . Routine general medical examination at a health care facility 02/08/2019  . Essential hypertension 01/08/2019  . Stasis dermatitis of both legs 01/16/2015  . ASD (atrial septal defect) 04/24/2013  . Constipation 06/16/2012  . Pure hypercholesterolemia 06/16/2012  . Intrinsic eczema 01/08/2010  . Pulmonary HTN ([place]) 08/20/2009  . ALLERGIC RHINITIS 08/20/2009  . [place] DISEASE, LUMBAR 08/20/2009  . Vitamin D deficiency 08/19/2009  . Iron deficiency anemia 08/19/2009    Farley Ly PT, MPT 09/18/2020, 3:12 PM  [facility] [address] [place], [place], [address] Phone: [phone]   Fax:  [phone]  Name: AUGUST GOSSER MRN: [id number] Date of Birth: [birth year]

## 2020-09-23 ENCOUNTER — Encounter: Payer: 59 | Admitting: Physical Therapy

## 2020-09-25 ENCOUNTER — Encounter: Payer: 59 | Admitting: Rehabilitative and Restorative Service Providers"

## 2020-09-30 ENCOUNTER — Encounter: Payer: 59 | Admitting: Physical Therapy

## 2020-10-02 ENCOUNTER — Encounter: Payer: 59 | Admitting: Rehabilitative and Restorative Service Providers"

## 2020-10-07 ENCOUNTER — Encounter: Payer: 59 | Admitting: Physical Therapy

## 2020-10-09 ENCOUNTER — Ambulatory Visit (INDEPENDENT_AMBULATORY_CARE_PROVIDER_SITE_OTHER): Payer: 59 | Admitting: Rehabilitative and Restorative Service Providers"

## 2020-10-09 ENCOUNTER — Other Ambulatory Visit: Payer: Self-pay

## 2020-10-09 ENCOUNTER — Encounter: Payer: Self-pay | Admitting: Rehabilitative and Restorative Service Providers"

## 2020-10-09 DIAGNOSIS — M25651 Stiffness of right hip, not elsewhere classified: Secondary | ICD-10-CM

## 2020-10-09 DIAGNOSIS — R262 Difficulty in walking, not elsewhere classified: Secondary | ICD-10-CM

## 2020-10-09 DIAGNOSIS — M6281 Muscle weakness (generalized): Secondary | ICD-10-CM | POA: Diagnosis not present

## 2020-10-09 DIAGNOSIS — R600 Localized edema: Secondary | ICD-10-CM | POA: Diagnosis not present

## 2020-10-09 DIAGNOSIS — M25551 Pain in right hip: Secondary | ICD-10-CM

## 2020-10-09 DIAGNOSIS — M25662 Stiffness of left knee, not elsewhere classified: Secondary | ICD-10-CM

## 2020-10-09 DIAGNOSIS — M25562 Pain in left knee: Secondary | ICD-10-CM

## 2020-10-09 NOTE — Therapy (Signed)
[facility] [address] [place], [place], [address] Phone: [phone]   Fax:  [phone]  Physical Therapy Treatment/Discharge  Patient Details  Name: Jessica Davenport MRN: [id number] Date of Birth: Dec 29, 1965 Referring Provider (PT): Kirtland Bouchard PA-C  PHYSICAL THERAPY DISCHARGE SUMMARY  Visits from Start of Care: 6  Current functional level related to goals / functional outcomes: See note   Remaining deficits: See note   Education / Equipment: HEP Plan: Patient agrees to discharge.  Patient goals were partially met. Patient is being discharged due to being pleased with the current functional level.  ?????     Encounter Date: 10/09/2020   PT End of Session - 10/09/20 1647    Visit Number 6    Number of Visits 16    PT Start Time 1345    PT Stop Time 1430    PT Time Calculation (min) 45 min    Activity Tolerance No increased pain;Patient tolerated treatment well    Behavior During Therapy [facility] for tasks assessed/performed           Past Medical History:  Diagnosis Date  . Abscess   . Allergy   . Bilateral lower extremity edema 12/02/2016  . Essential hypertension   . Fibroid, uterine   . Infected sebaceous cyst    mons pubis  . Iron deficiency anemia 08/19/2009       . Lower extremity edema   . Lumbar disc disease   . Morbid obesity ([facility])   . Obesity, Class III, BMI 40-49.9 (morbid obesity) ([facility]) 02/15/2012  . Ostium secundum type atrial septal defect    s/p 28 mm Amplatzer device closure in 2005  . Pulmonary HTN ([facility]) 08/20/2009       . Pure hypercholesterolemia 06/16/2012  . Small bowel obstruction ([facility]) 2013  . Vitamin D deficiency     Past Surgical History:  Procedure Laterality Date  . ABDOMINAL HYSTERECTOMY  12/07/11  . ASD REPAIR  2005/2009  . ENDOMETRIAL ABLATION  2010,2011,2012  . TUBAL LIGATION  1996  . uterine ablation      There were no vitals filed for this visit.   Subjective Assessment -  10/09/20 1348    Subjective Edona reports overall progress with her mostly independent physical therapy.  She has been avoiding stairs at work more due to her L foot.    Limitations Sitting;House hold activities;Standing;Walking    How long can you sit comfortably? A few hours (2)    How long can you stand comfortably? ~ 30 minutes    How long can you walk comfortably? ~ 2 miles    Patient Stated Goals Get rid of hip and knee pain.  Be able to sleep and stand/walk normally.    Currently in Pain? Yes    Pain Score 2     Pain Location Knee    Pain Orientation Left    Pain Descriptors / Indicators Aching;Sore    Pain Type Acute pain    Pain Onset More than a month ago    Pain Frequency Intermittent    Aggravating Factors  Standing and walking too much or late in the day    Pain Relieving Factors Exercises    Effect of Pain on Daily Activities Limits WB endurance    Multiple Pain Sites Yes   Hip wakes her up at night (2X over the past week and 1X sharp pain with driving).             [facility]  PT Assessment - 10/09/20 0001      Observation/Other Assessments   Focus on Therapeutic Outcomes (FOTO)  52 (was 48, Goal 55)      ROM / Strength   AROM / PROM / Strength AROM;Strength      AROM   Overall AROM  Deficits    AROM Assessment Site Knee;Hip    Right/Left Hip Left;Right    Right Hip Flexion 85    Right Hip External Rotation  19    Right Hip Internal Rotation  15    Left Hip Flexion 85    Left Hip External Rotation  22    Left Hip Internal Rotation  15    Right/Left Knee Left;Right    Right Knee Flexion 120    Left Knee Extension 0    Left Knee Flexion 114      Strength   Overall Strength Deficits      Flexibility   Soft Tissue Assessment /Muscle Length yes    Hamstrings 40 degrees L/45 degrees                         [facility] Adult PT Treatment/Exercise - 10/09/20 0001      Exercises   Exercises Knee/Hip      Knee/Hip Exercises: Stretches    Piriformis Stretch Both;4 reps;20 seconds   Supine figure 4 stretch     Knee/Hip Exercises: Machines for Strengthening   Cybex Leg Press 125# double leg press with slow eccentrics 2 sets of 20      Knee/Hip Exercises: Standing   Other Standing Knee Exercises Trunk extension AROM 10X 3 seconds      Knee/Hip Exercises: Seated   Sit to Sand 10 reps;without UE support   slow eccentrics     Knee/Hip Exercises: Supine   Quad Sets Strengthening;Both;2 sets;10 reps;Other (comment)    Quad Sets Limitations 5 seconds supine    Bridges Strengthening;Both;10 reps;Other (comment)   5 seconds   Bridges Limitations 5 seconds    Other Supine Knee/Hip Exercises Gluteal stretch (knee to opposite shoulder with strap-other leg straight) 4X 20 seconds                  PT Education - 10/09/20 1422    Education Details Reviewed exam findings and HEP.    Person(s) Educated Patient    Methods Explanation;Demonstration;Tactile cues;Verbal cues;Handout    Comprehension Verbal cues required;Need further instruction;Returned demonstration;Verbalized understanding;Tactile cues required            PT Short Term Goals - 10/09/20 1423      PT SHORT TERM GOAL #1   Title Adeja will be independent and compliant with her starter HEP.    Status Achieved             PT Long Term Goals - 10/09/20 1423      PT LONG TERM GOAL #1   Title Improve FOTO score to 55.    Baseline 52, was 48    Time 8    Status On-going      PT LONG TERM GOAL #2   Title Lahela will report R hip and L knee pain consistently 0-2/10 on the Numeric Pain Rating Scale.    Baseline Was 5/10    Time 8    Period Weeks    Status Achieved      PT LONG TERM GOAL #3   Title Agnieszka will be independent and compliant with her long-term maintenence HEP.  Time 8    Period Weeks    Status Achieved                 Plan - 10/09/20 1647    Clinical Impression Statement Teressa is doing much better since starting  her physical therapy.  She has demonstrated objective and functional progress (FOTO) along with her subjective progress.  She is independent and compliant with her long-term HEP.  Luv appears ready for discharge into independent rehabilitation.    Personal Factors and Comorbidities Comorbidity 1    Comorbidities High BMI, prediabetes    Examination-Activity Limitations Sit;Sleep;Bed Mobility;Lift;Squat;Stairs;Carry;Stand    Examination-Participation Restrictions Occupation;Cleaning;Community Activity;Driving    Stability/Clinical Decision Making Stable/Uncomplicated    Rehab Potential Good    PT Frequency 2x / week    PT Duration 8 weeks    PT Treatment/Interventions ADLs/Self Care Home Management;Moist Heat;Cryotherapy;Therapeutic activities;Stair training;Gait training;Therapeutic exercise;Neuromuscular re-education;Patient/family education;Manual techniques;Vasopneumatic Device    PT Next Visit Plan Progress quadriceps strengthening, hip strengthening and general conditioning    PT Home Exercise Plan Access Code: [email or web address]    Consulted and Agree with Plan of Care Patient           Patient will benefit from skilled therapeutic intervention in order to improve the following deficits and impairments:  Abnormal gait,Decreased activity tolerance,Decreased endurance,Decreased range of motion,Decreased strength,Difficulty walking,Increased edema,Impaired flexibility,Obesity,Pain  Visit Diagnosis: Difficulty walking  Localized edema  Muscle weakness (generalized)  Pain in right hip  Stiffness of right hip, not elsewhere classified  Acute pain of left knee  Stiffness of left knee, not elsewhere classified     Problem List Patient Active Problem List   Diagnosis Date Noted  . Idiopathic intracranial hypertension 03/25/2020  . Need for hepatitis C screening test 02/26/2020  . Diuretic-induced hypokalemia 02/26/2020  . 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  Leukocytosis 01/16/2020  . Esophageal dysphagia 01/15/2020  . GAD (generalized anxiety disorder) 01/09/2020  . Morbid obesity with BMI of 45.0-49.9, adult ([place]) 02/23/2019  . Prediabetes 02/08/2019  . Routine general medical examination at a health care facility 02/08/2019  . Essential hypertension 01/08/2019  . Stasis dermatitis of both legs 01/16/2015  . ASD (atrial septal defect) 04/24/2013  . Constipation 06/16/2012  . Pure hypercholesterolemia 06/16/2012  . Intrinsic eczema 01/08/2010  . Pulmonary HTN ([place]) 08/20/2009  . ALLERGIC RHINITIS 08/20/2009  . [place] DISEASE, LUMBAR 08/20/2009  . Vitamin D deficiency 08/19/2009  . Iron deficiency anemia 08/19/2009    Farley Ly PT, MPT 10/09/2020, 4:50 PM  [facility] [address] [place], [place], [address] Phone: [phone]   Fax:  [phone]  Name: ELIZEBETH KLUESNER MRN: [id number] Date of Birth: [birth year]

## 2020-10-09 NOTE — Patient Instructions (Signed)
Access Code: [email or web address] URL: [email or web address] Date: 10/09/2020 Prepared by: Vista Mink  Exercises Supine Figure 4 Piriformis Stretch - 2 x daily - 7 x weekly - 1 sets - 5 reps - 20 seconds hold Supine Quadricep Sets - 2 x daily - 7 x weekly - 3-5 sets - 10 reps - 5 seconds hold Standing Lumbar Extension at [place] - 5 x daily - 7 x weekly - 1 sets - 5 reps - 3 seconds hold Sit to Stand with Armchair - 2 x daily - 7 x weekly - 1 sets - 5 reps Bridge - 1 x daily - 7 x weekly - 2 sets - 10 reps - 5 seconds hold Supine Gluteus Stretch - 1 x daily - 7 x weekly - 1 sets - 5 reps - 20 seconds hold

## 2020-10-19 ENCOUNTER — Other Ambulatory Visit: Payer: Self-pay | Admitting: Internal Medicine

## 2020-10-19 DIAGNOSIS — I10 Essential (primary) hypertension: Secondary | ICD-10-CM

## 2021-01-07 ENCOUNTER — Telehealth: Payer: Self-pay | Admitting: Internal Medicine

## 2021-01-07 NOTE — Telephone Encounter (Signed)
Type of form received: FML  Additional comments:   Received by: Amy/fax machine  Form should be Faxed to:[phone]  Form should be mailed to:  n/a  Is patient requesting call for pickup: n/a    Form placed in the Provider's box.

## 2021-01-08 DIAGNOSIS — Z0279 Encounter for issue of other medical certificate: Secondary | ICD-10-CM

## 2021-01-08 NOTE — Telephone Encounter (Signed)
Forms have been signed, faxed back.  Copy sent to scan Copy given to charge  Original has been filed.

## 2021-01-08 NOTE — Telephone Encounter (Signed)
Forms have been completed and given to PCP for review.

## 2021-02-02 ENCOUNTER — Telehealth: Payer: Self-pay

## 2021-02-02 NOTE — Telephone Encounter (Signed)
FMLA forms have been received via fax.

## 2021-02-02 NOTE — Telephone Encounter (Signed)
-----   Message from Marguarite Arbour sent at 02/02/2021  4:09 PM EDT ----- FMLA paperwork

## 2021-02-02 NOTE — Telephone Encounter (Signed)
After review of forms. This is a duplicate request of FMLA that was completed on 01/08/21 per chart media tab.   I will reach out the the pt to see if forms was received by employer on the original fax date.

## 2021-02-03 NOTE — Telephone Encounter (Signed)
Called pt, LVM asking if employer received forms that were faxed back last month.

## 2021-04-29 ENCOUNTER — Telehealth: Payer: Self-pay

## 2021-04-29 NOTE — Telephone Encounter (Signed)
FMLA forms have been received, completed and given to PCP to review and sign.

## 2021-05-01 NOTE — Telephone Encounter (Signed)
FMLA has been signed and faxed back.   Copy sent to charge Copy filed with [place]

## 2021-05-14 ENCOUNTER — Telehealth: Payer: Self-pay | Admitting: Gastroenterology

## 2021-05-14 NOTE — Telephone Encounter (Signed)
Patient requested an appointment called her to schedule left voicemail.

## 2021-05-22 ENCOUNTER — Encounter: Payer: Self-pay | Admitting: Internal Medicine

## 2021-05-22 ENCOUNTER — Other Ambulatory Visit: Payer: Self-pay

## 2021-05-22 ENCOUNTER — Ambulatory Visit: Payer: 59 | Admitting: Internal Medicine

## 2021-05-22 DIAGNOSIS — J069 Acute upper respiratory infection, unspecified: Secondary | ICD-10-CM | POA: Insufficient documentation

## 2021-05-22 LAB — POCT INFLUENZA A/B
Influenza A, POC: NEGATIVE
Influenza B, POC: NEGATIVE

## 2021-05-22 MED ORDER — MOLNUPIRAVIR EUA 200MG CAPSULE: 4.0000 | ORAL_CAPSULE | Freq: Two times a day (BID) | ORAL | 0 refills | Status: AC

## 2021-05-22 NOTE — Assessment & Plan Note (Addendum)
Exposure to covid-19 making this more likely. Educated that with symptoms covid-19 home testing 2 tests done 24 hours apart is method for testing. She had 1 negative home test. She desires flu testing which was done and negative. We then proceeded with covid-19 PCR testing and rx for molnupiravir to start given high chance and if testing comes back negative she will stop. She is appropriate and high risk if positive. Rx molnupiravir.

## 2021-05-22 NOTE — Progress Notes (Signed)
   Subjective:   Patient ID: Jessica Davenport, female    DOB: Apr 15, 1966, 55 y.o.   MRN: [id number]  HPI The patient is a 55 YO female coming in for cold chills and headaches. Mom positive to covid-19 this week and she sees her daily. 1 negative home covid-19 test Tuesday which was day of onset of symptoms. Has not taken anything.  Review of Systems  Constitutional:  Positive for chills.  HENT:  Positive for sore throat.   Eyes: Negative.   Respiratory:  Negative for cough, chest tightness and shortness of breath.   Cardiovascular:  Negative for chest pain, palpitations and leg swelling.  Gastrointestinal:  Negative for abdominal distention, abdominal pain, constipation, diarrhea, nausea and vomiting.  Musculoskeletal: Negative.   Skin: Negative.   Neurological:  Positive for headaches.  Psychiatric/Behavioral: Negative.     Objective:  Physical Exam Constitutional:      Appearance: She is well-developed. She is obese.  HENT:     Head: Normocephalic and atraumatic.  Cardiovascular:     Rate and Rhythm: Normal rate and regular rhythm.  Pulmonary:     Effort: Pulmonary effort is normal. No respiratory distress.     Breath sounds: Normal breath sounds. No wheezing or rales.  Abdominal:     General: Bowel sounds are normal. There is no distension.     Palpations: Abdomen is soft.     Tenderness: There is no abdominal tenderness. There is no rebound.  Musculoskeletal:     Cervical back: Normal range of motion.  Skin:    General: Skin is warm and dry.  Neurological:     Mental Status: She is alert and oriented to person, place, and time.     Coordination: Coordination normal.    Vitals:   05/22/21 0829  BP: 130/90  Pulse: 74  Resp: 18  SpO2: 98%  Weight: (!) 307 lb 6.4 oz (139.4 kg)  Height: 5\' 7"  (1.702 m)   POC Flu A and B negative  This visit occurred during the SARS-CoV-2 public health emergency.  Safety protocols were in place, including screening questions prior  to the visit, additional usage of staff PPE, and extensive cleaning of exam room while observing appropriate contact time as indicated for disinfecting solutions.   Assessment & Plan:  Visit time 25 minutes in face to face communication with patient and coordination of care, additional 5 minutes spent in record review, coordination or care, ordering tests, communicating/referring to other healthcare professionals, documenting in medical records all on the same day of the visit for total time 30 minutes spent on the visit.

## 2021-05-22 NOTE — Patient Instructions (Signed)
The flu test is negative and we are sending a covid-19 pcr test today.   We have sent in the antiviral molnupiravir to start taking today as this likely is covid-19. If the test comes back not detected/negative over the weekend just stop taking the antiviral.

## 2021-05-24 LAB — SARS-COV-2, NAA 2 DAY TAT

## 2021-05-24 LAB — NOVEL CORONAVIRUS, NAA: SARS-CoV-2, NAA: NOT DETECTED

## 2021-05-25 ENCOUNTER — Encounter: Payer: Self-pay | Admitting: Internal Medicine

## 2021-06-05 ENCOUNTER — Encounter: Payer: Self-pay | Admitting: Nurse Practitioner

## 2021-06-05 ENCOUNTER — Telehealth (INDEPENDENT_AMBULATORY_CARE_PROVIDER_SITE_OTHER): Payer: 59 | Admitting: Nurse Practitioner

## 2021-06-05 VITALS — Temp 98.1°F

## 2021-06-05 DIAGNOSIS — R051 Acute cough: Secondary | ICD-10-CM

## 2021-06-05 DIAGNOSIS — G4489 Other headache syndrome: Secondary | ICD-10-CM

## 2021-06-05 DIAGNOSIS — J4520 Mild intermittent asthma, uncomplicated: Secondary | ICD-10-CM | POA: Insufficient documentation

## 2021-06-05 DIAGNOSIS — J029 Acute pharyngitis, unspecified: Secondary | ICD-10-CM

## 2021-06-05 MED ORDER — PROAIR RESPICLICK 108 (90 BASE) MCG/ACT IN AEPB: 1.0000 | INHALATION_SPRAY | Freq: Three times a day (TID) | RESPIRATORY_TRACT | 0 refills | Status: DC | PRN

## 2021-06-05 NOTE — Assessment & Plan Note (Signed)
Continue using over-the-counter regimens for cough relief.

## 2021-06-05 NOTE — Assessment & Plan Note (Signed)
Patient states she was out of her albuterol inhaler and having some DOE with her symptoms we will renew albuterol inhaler for patient to let her know. Continue using albuterol inhaler as needed for shortness of breath.

## 2021-06-05 NOTE — Assessment & Plan Note (Signed)
Been using over-the-counter medications for symptomatic relief.

## 2021-06-05 NOTE — Progress Notes (Signed)
Patient ID: Jessica Davenport, female    DOB: [birth year], 55 y.o.   MRN: [id number]  Virtual visit completed through [place], a video enabled telemedicine application. Due to national recommendations of social distancing due to COVID-19, a virtual visit is felt to be most appropriate for this patient at this time. Reviewed limitations, risks, security and privacy concerns of performing a virtual visit and the availability of in person appointments. I also reviewed that there may be a patient responsible charge related to this service. The patient agreed to proceed.   Attempted to connect via video enabled device but was unsuccessful due to no audio. Reverted to phone call  Patient location: Car Provider location:  at [facility], office Persons participating in this virtual visit: patient, provider   If any vitals were documented, they were collected by patient at home unless specified below.    Temp 98.1 F (36.7 C) Comment: per patient  LMP 11/10/2011    CC: Sore throat Subjective:   HPI: Jessica Davenport is a 55 y.o. female presenting on 06/05/2021 for Sore Throat (On 06/04/21, earache, sinus drainage-post nasal drip, hoarse, cough. Did not test for COVID. )  Symptoms started yesterday 06/04/2021 At home test yet Moderna x2 No sick   Tylenol and ricola and robitussien, nyquill cold and flu, with out relief.  Patient did take an at home covid test after out our visit and it was negative   Relevant past medical, surgical, family and social history reviewed and updated as indicated. Interim medical history since our last visit reviewed. Allergies and medications reviewed and updated. Outpatient Medications Prior to Visit  Medication Sig Dispense Refill   Albuterol Sulfate (PROAIR RESPICLICK) 017 (90 Base) MCG/ACT AEPB Inhale 1 puff into the lungs 3 (three) times daily as needed. 1 each 1   clonazePAM (KLONOPIN) 1 MG tablet Take 1 tablet (1 mg total) by mouth 2 (two)  times daily as needed for anxiety. 60 tablet 1   EPINEPHrine 0.3 mg/0.3 mL IJ SOAJ injection Inject 0.3 mg into the muscle as needed for anaphylaxis.      furosemide (LASIX) 80 MG tablet Take 1 tablet (80 mg total) by mouth daily. 90 tablet 3   indapamide (LOZOL) 1.25 MG tablet Take 1 tablet (1.25 mg total) by mouth daily. 90 tablet 0   olmesartan (BENICAR) 20 MG tablet TAKE ONE TABLET BY MOUTH DAILY 90 tablet 0   omeprazole (PRILOSEC) 20 MG capsule Take 40 mg by mouth daily.      potassium chloride SA (KLOR-CON) 20 MEQ tablet Take 2 tablets by mouth every AM and one tablet every PM 270 tablet 3   No facility-administered medications prior to visit.     Per HPI unless specifically indicated in ROS section below Review of Systems  Constitutional:  Positive for chills and fatigue. Negative for fever.  HENT:  Positive for congestion, ear pain (pressure), rhinorrhea, sinus pressure, sinus pain and sore throat.   Respiratory:  Positive for cough (yellow/clear) and shortness of breath (doe).   Cardiovascular:  Negative for chest pain.  Gastrointestinal:  Negative for abdominal pain, diarrhea, nausea and vomiting.  Musculoskeletal:  Positive for myalgias. Negative for arthralgias.  Neurological:  Positive for headaches.  Objective:  Temp 98.1 F (36.7 C) Comment: per patient  LMP 11/10/2011   Wt Readings from Last 3 Encounters:  05/22/21 (!) 307 lb 6.4 oz (139.4 kg)  08/13/20 (!) 320 lb (145.2 kg)  04/11/20 (!) 313 lb (142 kg)  Physical exam: Gen: alert, NAD, not ill appearing Pulm: speaks in complete sentences without increased work of breathing Psych: normal mood, normal thought content      Results for orders placed or performed in visit on 05/22/21  Novel Coronavirus, NAA ([facility])   Specimen: Nasopharyngeal(NP) swabs in vial transport medium   Nasopharynge  Resident  Result Value Ref Range   SARS-CoV-2, NAA Not Detected Not Detected  SARS-COV-2, NAA 2 DAY TAT    Nasopharynge  Resident  Result Value Ref Range   SARS-CoV-2, NAA 2 DAY TAT Performed   POCT Influenza A/B  Result Value Ref Range   Influenza A, POC Negative Negative   Influenza B, POC Negative Negative   Assessment & Plan:   Problem List Items Addressed This Visit       Respiratory   Mild intermittent asthma without complication    Patient states she was out of her albuterol inhaler and having some DOE with her symptoms we will renew albuterol inhaler for patient to let her know. Continue using albuterol inhaler as needed for shortness of breath.      Relevant Medications   Albuterol Sulfate (PROAIR RESPICLICK) 630 (90 Base) MCG/ACT AEPB     Other   Sore throat - Primary    At home COVID test was negative patient did state that she does get strep fairly regularly unable to test patient at the current moment did discuss this with her.  Did discuss signs and symptoms when to seek urgent or emergent health care or she go to urgent care this weekend to be tested for flu and strep throat.  If not patient will touch base with me on Monday to see how she is doing with over-the-counter regimens.  Continue to monitor      Acute cough    Continue using over-the-counter regimens for cough relief.      Other headache syndrome    Been using over-the-counter medications for symptomatic relief.        No orders of the defined types were placed in this encounter.  No orders of the defined types were placed in this encounter.  Phone call lasted 10 minutes  I discussed the assessment and treatment plan with the patient. The patient was provided an opportunity to ask questions and all were answered. The patient agreed with the plan and demonstrated an understanding of the instructions. The patient was advised to call back or seek an in-person evaluation if the symptoms worsen or if the condition fails to improve as anticipated.  Follow up plan: No follow-ups on file.  Romilda Garret, NP

## 2021-06-05 NOTE — Assessment & Plan Note (Signed)
At home COVID test was negative patient did state that she does get strep fairly regularly unable to test patient at the current moment did discuss this with her.  Did discuss signs and symptoms when to seek urgent or emergent health care or she go to urgent care this weekend to be tested for flu and strep throat.  If not patient will touch base with me on Monday to see how she is doing with over-the-counter regimens.  Continue to monitor

## 2021-06-08 ENCOUNTER — Other Ambulatory Visit: Payer: Self-pay | Admitting: Nurse Practitioner

## 2021-06-08 ENCOUNTER — Telehealth: Payer: Self-pay

## 2021-06-08 DIAGNOSIS — J4520 Mild intermittent asthma, uncomplicated: Secondary | ICD-10-CM

## 2021-06-08 DIAGNOSIS — J020 Streptococcal pharyngitis: Secondary | ICD-10-CM

## 2021-06-08 LAB — POCT INFLUENZA A/B
Influenza A, POC: NEGATIVE
Influenza B, POC: NEGATIVE

## 2021-06-08 LAB — POCT RAPID STREP A (OFFICE): Rapid Strep A Screen: POSITIVE — AB

## 2021-06-08 MED ORDER — ALBUTEROL SULFATE HFA 108 (90 BASE) MCG/ACT IN AERS: 2.0000 | INHALATION_SPRAY | Freq: Four times a day (QID) | RESPIRATORY_TRACT | 0 refills | Status: DC | PRN

## 2021-06-08 MED ORDER — AMOXICILLIN 500 MG PO CAPS: 500.0000 mg | ORAL_CAPSULE | Freq: Two times a day (BID) | ORAL | 0 refills | Status: DC

## 2021-06-08 NOTE — Telephone Encounter (Signed)
Received fax from pharmacy stating Proair Resplick type needs PA but regular generic Proair HFA aor Proventil HFA is covered. Can this be switched to the covered options or proceed with PA?

## 2021-06-08 NOTE — Telephone Encounter (Signed)
We had discussed her getting tested if she is still having the same symptoms. Is she interested in that? For flu and strep

## 2021-06-08 NOTE — Telephone Encounter (Signed)
Noted will advise patient

## 2021-06-08 NOTE — Addendum Note (Signed)
Addended by: Kris Mouton on: 06/08/2021 04:29 PM   Modules accepted: Orders

## 2021-06-09 ENCOUNTER — Encounter: Payer: Self-pay | Admitting: Nurse Practitioner

## 2021-06-09 NOTE — Telephone Encounter (Signed)
Pt called as well and said she is psitive for strep and was told by her health at work to stay out of work and when she told her job they said she needs a note. Please advise. Callback [phone]

## 2021-06-17 ENCOUNTER — Encounter: Payer: Self-pay | Admitting: Gastroenterology

## 2021-06-17 ENCOUNTER — Ambulatory Visit (INDEPENDENT_AMBULATORY_CARE_PROVIDER_SITE_OTHER): Payer: 59 | Admitting: Gastroenterology

## 2021-06-17 ENCOUNTER — Other Ambulatory Visit: Payer: Self-pay | Admitting: Internal Medicine

## 2021-06-17 VITALS — BP 140/90 | HR 73 | Ht 67.0 in | Wt 305.0 lb

## 2021-06-17 DIAGNOSIS — R1013 Epigastric pain: Secondary | ICD-10-CM | POA: Diagnosis not present

## 2021-06-17 DIAGNOSIS — R197 Diarrhea, unspecified: Secondary | ICD-10-CM | POA: Diagnosis not present

## 2021-06-17 DIAGNOSIS — F411 Generalized anxiety disorder: Secondary | ICD-10-CM

## 2021-06-17 DIAGNOSIS — K580 Irritable bowel syndrome with diarrhea: Secondary | ICD-10-CM

## 2021-06-17 DIAGNOSIS — G8929 Other chronic pain: Secondary | ICD-10-CM | POA: Diagnosis not present

## 2021-06-17 MED ORDER — VIBERZI 75 MG PO TABS: 1.0000 | ORAL_TABLET | Freq: Two times a day (BID) | ORAL | 5 refills | Status: DC

## 2021-06-17 NOTE — Progress Notes (Signed)
Referring Provider: Janith Lima, MD Primary Care Physician:  Janith Lima, MD  Chief complaint:  Abdominal pain   IMPRESSION:  Intermittent right upper quadrant abdominal pain with associated change in bowel habits and anorexia not explained by abdominal ultrasound or contrasted CT scan  Suspected functional GI disease based on the chronicity. However, the differential also includes PUD, H pylori, gastritis, esophagitis, celiac disease, food intolerance (lactose, fructose, sucrose), SIBO, symptomatic gallbladder disease and less likely IBD. Given the diverticulosis seen on CT scan, must also consider symptomatic diverticular disease.   PLAN: - Continue Viberzi - Add a daily dose of psyllium or methylcellulose - Trial of dicyclomine 20 mg p.o. 4 times daily - EGD and Colonoscopy - If endoscopic evaluation is negative we will proceed with evaluation for symptomatic gallbladder disease  Please see the "Patient Instructions" section for addition details about the plan.  HPI: Jessica Davenport is a 55 y.o. female seen at the request of Dr. Ronnald Ramp 03/25/20 for abdominal pain, nausea, and change in bowel habits. Endoscopic evaluation recommended but not performed.   She has high blood pressure, obesity, anxiety, anemia in 2019, reflux, pneumonia in 2000 and ASD closure in 2005 at [facility]. EF >60% on Echocardiogram 02/23/19. 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  Previously followed by Dr. Olevia Perches - last seen in 2013. Seen for similar symptoms.   At the time of consultation in 2021 she reported years of intermittent, sharp, occasionally dull and achy, RUQ abdominal pain with associated nausea, anorexia, hyperosmia, change in bowel habits, and abdominal wall pain over the RUQ. Symptoms are frequently postprandial.  She was seen by Dr. Maurene Capes in 2013 for similar symptoms.  However the symptoms were so acute that had completely resolved by the time of her evaluation with Dr. Olevia Perches.  No specific  food triggers.  Temporally associated with stress.  No change with movement or defecation. No blood or mucous in the stool.   Viberzi improved her diarrhea but not her pain.   More recent evaluation has included:  01/15/20 Abdominal films showed no obstruction.  01/17/20 CT abd/pelvis with contrast showed diverticulosis without diverticulitis, no acute abnormality 01/23/20 Abdominal ultrasound essentially normal   She had a Cologard 02/2019 that was normal.   Gallbladder disease in her mother and sister. She thinks a maternal uncle may have had colon cancer.  No other known family history of colon cancer or polyps. No family history of uterine/endometrial cancer, pancreatic cancer or gastric/stomach cancer.   Past Medical History:  Diagnosis Date   Abscess    Allergy    Bilateral lower extremity edema 12/02/2016   Essential hypertension    Fibroid, uterine    Infected sebaceous cyst    mons pubis   Iron deficiency anemia 08/19/2009        Lower extremity edema    Lumbar disc disease    Morbid obesity ([place])    Obesity, Class III, BMI 40-49.9 (morbid obesity) ([place]) 02/15/2012   Ostium secundum type atrial septal defect    s/p 28 mm Amplatzer device closure in 2005   Pulmonary HTN ([place]) 08/20/2009        Pure hypercholesterolemia 06/16/2012   Small bowel obstruction ([place]) 2013   Vitamin D deficiency     Past Surgical History:  Procedure Laterality Date   ABDOMINAL HYSTERECTOMY  12/07/11   ASD REPAIR  2005/2009   ENDOMETRIAL ABLATION  2010,2011,2012   TUBAL LIGATION  1996   uterine ablation  Current Outpatient Medications  Medication Sig Dispense Refill   albuterol (VENTOLIN HFA) 108 (90 Base) MCG/ACT inhaler Inhale 2 puffs into the lungs every 6 (six) hours as needed for wheezing or shortness of breath. 8 g 0   clonazePAM (KLONOPIN) 1 MG tablet Take 1 tablet (1 mg total) by mouth 2 (two) times daily as needed for anxiety. 60 tablet 1   EPINEPHrine 0.3 mg/0.3 mL IJ SOAJ  injection Inject 0.3 mg into the muscle as needed for anaphylaxis.      furosemide (LASIX) 80 MG tablet Take 1 tablet (80 mg total) by mouth daily. 90 tablet 3   indapamide (LOZOL) 1.25 MG tablet Take 1 tablet (1.25 mg total) by mouth daily. 90 tablet 0   olmesartan (BENICAR) 20 MG tablet TAKE ONE TABLET BY MOUTH DAILY 90 tablet 0   omeprazole (PRILOSEC) 20 MG capsule Take 40 mg by mouth daily.      potassium chloride SA (KLOR-CON) 20 MEQ tablet Take 2 tablets by mouth every AM and one tablet every PM 270 tablet 3   No current facility-administered medications for this visit.    Allergies as of 06/17/2021 - Review Complete 06/17/2021  Allergen Reaction Noted   Lisinopril Hives 01/25/2019   Other  06/01/2012    Family History  Problem Relation Age of Onset   Coronary artery disease Mother    Hypertension Mother    Diabetes Mother    Breast cancer Mother    Coronary artery disease Father    Hypertension Father    Diabetes Father    Anxiety disorder Sister    Ovarian cancer Maternal Aunt    Breast cancer Maternal Aunt    Colon cancer Maternal Uncle    Cervical cancer Maternal Aunt       Physical Exam: General:   Alert,  well-nourished, pleasant and cooperative in NAD Head:  Normocephalic and atraumatic. Eyes:  Sclera clear, no icterus.   Conjunctiva pink. Abdomen:  Soft, nontender, nondistended, normal bowel sounds, no rebound or guarding. No hepatosplenomegaly.   Rectal:  Deferred  Msk:  Symmetrical. No boney deformities LAD: No inguinal or umbilical LAD Extremities:  No clubbing or edema. Neurologic:  Alert and  oriented x4;  grossly nonfocal Skin:  Intact without significant lesions or rashes. Psych:  Alert and cooperative. Normal mood and affect.    Yu Cragun L. Tarri Glenn, MD, MPH 06/17/2021, 3:18 PM

## 2021-06-17 NOTE — Patient Instructions (Signed)
We have sent the following medications to your pharmacy for you to pick up at your convenience: Viberzi.  You have been scheduled for an endoscopy. Please follow written instructions given to you at your visit today. If you use inhalers (even only as needed), please bring them with you on the day of your procedure.

## 2021-06-17 NOTE — Progress Notes (Signed)
Referring Provider: Janith Lima, MD Primary Care Physician:  Janith Lima, MD  Reason for Consultation:  Abdominal pain   IMPRESSION:  Epigastric pain -  Intermittent midabdominal pain previously associated change in bowel habits and anorexia not explained by abdominal ultrasound or contrasted CT scan  Suspected functional GI disease based on the chronicity. Worsened by recent psychosocial stressors with ongoing care for her Mom. However, the differential also includes PUD, H pylori, gastritis, esophagitis, celiac disease, food intolerance (lactose, fructose, sucrose), SIBO, symptomatic gallbladder disease and less likely IBD. Given the diverticulosis seen on CT scan, must also consider symptomatic diverticular disease.   PLAN: - Continue omeprazole 40 mg daily - Resume Viberzi - EGD with biopsies of the esophagus, gastric, and duodenum - She would like to defer colonoscopy until next year - If endoscopic evaluation is negative we will proceed with evaluation for symptomatic gallbladder disease  Please see the "Patient Instructions" section for addition details about the plan.  HPI: Jessica Davenport is a 55 y.o. female seen initially seen 03/25/20 at the request of Dr. Ronnald Ramp for abdominal pain, nausea, and change in bowel habits. She has high blood pressure, obesity, anxiety, anemia in 2019, reflux, pneumonia in 2000 and ASD closure in 2005 at [facility]. EF >60% on Echocardiogram 02/23/19. 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  Previously followed by Dr. Olevia Perches - last seen in 2013. Seen for similar symptoms.   Seen 03/2020 for post-prandial RUQ abdominal pain with associated nausea and change in bowel habits. She described the pain as intermittently sharp, intermittently dull, intermittently achy, and frequently associated with abdominal wall pain overlying the right upper quadrant.  Associated anorexia, loose stools, and hyperosmia. No specific food triggers.  Temporally associated with  stress.  No change with movement or defecation. No blood or mucous in the stool.   Endoscopic evaluation recommended but not scheduled.  Returns today with the same symptoms. Restarted Prilosec but her symptoms have not completely resolved. She is ready to proceed with endoscopic evaluation. She is busy taking care of her mother.   She has tried to avoid carbonated beverages.   She is also out of Viberzi and is requesting a refill.   Recent evaluation has included:  01/15/20 Abdominal films showed no obstruction.  01/17/20 CT abd/pelvis with contrast showed diverticulosis without diverticulitis, no acute abnormality 01/23/20 Abdominal ultrasound essentially normal   She had a Cologard 02/2019 that was normal. Does not want to have a colonoscopy.   Gallbladder disease in her mother and sister. She thinks a maternal uncle may have had colon cancer.  No other known family history of colon cancer or polyps. No family history of uterine/endometrial cancer, pancreatic cancer or gastric/stomach cancer.   Past Medical History:  Diagnosis Date   Abscess    Allergy    Bilateral lower extremity edema 12/02/2016   Essential hypertension    Fibroid, uterine    Infected sebaceous cyst    mons pubis   Iron deficiency anemia 08/19/2009        Lower extremity edema    Lumbar disc disease    Morbid obesity ([place])    Obesity, Class III, BMI 40-49.9 (morbid obesity) ([place]) 02/15/2012   Ostium secundum type atrial septal defect    s/p 28 mm Amplatzer device closure in 2005   Pulmonary HTN ([place]) 08/20/2009        Pure hypercholesterolemia 06/16/2012   Small bowel obstruction ([place]) 2013   Vitamin D deficiency  Past Surgical History:  Procedure Laterality Date   ABDOMINAL HYSTERECTOMY  12/07/11   ASD REPAIR  2005/2009   ENDOMETRIAL ABLATION  2010,2011,2012   TUBAL LIGATION  1996   uterine ablation      Current Outpatient Medications  Medication Sig Dispense Refill   albuterol (VENTOLIN HFA) 108  (90 Base) MCG/ACT inhaler Inhale 2 puffs into the lungs every 6 (six) hours as needed for wheezing or shortness of breath. 8 g 0   amoxicillin (AMOXIL) 500 MG capsule Take 1 capsule (500 mg total) by mouth 2 (two) times daily for 10 days. 20 capsule 0   clonazePAM (KLONOPIN) 1 MG tablet Take 1 tablet (1 mg total) by mouth 2 (two) times daily as needed for anxiety. 60 tablet 1   EPINEPHrine 0.3 mg/0.3 mL IJ SOAJ injection Inject 0.3 mg into the muscle as needed for anaphylaxis.      furosemide (LASIX) 80 MG tablet Take 1 tablet (80 mg total) by mouth daily. 90 tablet 3   indapamide (LOZOL) 1.25 MG tablet Take 1 tablet (1.25 mg total) by mouth daily. 90 tablet 0   olmesartan (BENICAR) 20 MG tablet TAKE ONE TABLET BY MOUTH DAILY 90 tablet 0   omeprazole (PRILOSEC) 20 MG capsule Take 40 mg by mouth daily.      potassium chloride SA (KLOR-CON) 20 MEQ tablet Take 2 tablets by mouth every AM and one tablet every PM 270 tablet 3   No current facility-administered medications for this visit.    Allergies as of 06/17/2021 - Review Complete 06/05/2021  Allergen Reaction Noted   Lisinopril Hives 01/25/2019   Other  06/01/2012    Family History  Problem Relation Age of Onset   Coronary artery disease Mother    Hypertension Mother    Diabetes Mother    Breast cancer Mother    Coronary artery disease Father    Hypertension Father    Diabetes Father    Anxiety disorder Sister    Ovarian cancer Maternal Aunt    Breast cancer Maternal Aunt    Colon cancer Maternal Uncle    Cervical cancer Maternal Aunt      Physical Exam: General:   Alert,  well-nourished, pleasant and cooperative in NAD Head:  Normocephalic and atraumatic. Eyes:  Sclera clear, no icterus.   Conjunctiva pink. Abdomen:  Soft, nontender, nondistended, normal bowel sounds, no rebound or guarding. No hepatosplenomegaly.   Neurologic:  Alert and  oriented x4;  grossly nonfocal Skin:  Intact without significant lesions or  rashes. Psych:  Alert and cooperative. Normal mood and affect.    Jeramine Delis L. Tarri Glenn, MD, MPH 06/17/2021, 3:10 PM

## 2021-06-18 ENCOUNTER — Encounter: Payer: Self-pay | Admitting: Gastroenterology

## 2021-06-22 ENCOUNTER — Encounter: Payer: 59 | Admitting: Gastroenterology

## 2021-06-26 ENCOUNTER — Encounter: Payer: Self-pay | Admitting: Gastroenterology

## 2021-06-26 ENCOUNTER — Ambulatory Visit (AMBULATORY_SURGERY_CENTER): Payer: 59 | Admitting: Gastroenterology

## 2021-06-26 VITALS — BP 126/75 | HR 81 | Temp 97.3°F | Resp 13 | Ht 67.0 in | Wt 305.0 lb

## 2021-06-26 DIAGNOSIS — K2100 Gastro-esophageal reflux disease with esophagitis, without bleeding: Secondary | ICD-10-CM | POA: Diagnosis not present

## 2021-06-26 DIAGNOSIS — K2970 Gastritis, unspecified, without bleeding: Secondary | ICD-10-CM | POA: Diagnosis not present

## 2021-06-26 DIAGNOSIS — R1013 Epigastric pain: Secondary | ICD-10-CM | POA: Diagnosis not present

## 2021-06-26 DIAGNOSIS — K2950 Unspecified chronic gastritis without bleeding: Secondary | ICD-10-CM

## 2021-06-26 DIAGNOSIS — K2980 Duodenitis without bleeding: Secondary | ICD-10-CM

## 2021-06-26 DIAGNOSIS — K449 Diaphragmatic hernia without obstruction or gangrene: Secondary | ICD-10-CM | POA: Diagnosis not present

## 2021-06-26 DIAGNOSIS — G8929 Other chronic pain: Secondary | ICD-10-CM

## 2021-06-26 MED ORDER — SODIUM CHLORIDE 0.9 % IV SOLN: 500.0000 mL | Freq: Once | INTRAVENOUS | Status: DC

## 2021-06-26 MED ORDER — OMEPRAZOLE 40 MG PO CPDR
40.0000 mg | DELAYED_RELEASE_CAPSULE | Freq: Two times a day (BID) | ORAL | 0 refills | Status: DC
Start: 2021-06-26 — End: 2021-07-06

## 2021-06-26 NOTE — Progress Notes (Signed)
Pt in recovery with monitors in place, VSS. Report given to receiving RN. Bite guard was placed with pt awake to ensure comfort. No dental or soft tissue damage noted. 

## 2021-06-26 NOTE — Patient Instructions (Signed)
Information on gastritis and hiatal hernia given to you today.  Await pathology results.  Start Omeprazole 40 mg twice a day for at least 10 weeks.  Resume previous diet and medications. Avoid NSAIDS (Aspirin, Ibuprofen, Aleve, Naproxen), you may use Tylenol as needed.   YOU HAD AN ENDOSCOPIC PROCEDURE TODAY AT [facility]:   Refer to the procedure report that was given to you for any specific questions about what was found during the examination.  If the procedure report does not answer your questions, please call your gastroenterologist to clarify.  If you requested that your care partner not be given the details of your procedure findings, then the procedure report has been included in a sealed envelope for you to review at your convenience later.  YOU SHOULD EXPECT: Some feelings of bloating in the abdomen. Passage of more gas than usual.  Walking can help get rid of the air that was put into your GI tract during the procedure and reduce the bloating. If you had a lower endoscopy (such as a colonoscopy or flexible sigmoidoscopy) you may notice spotting of blood in your stool or on the toilet paper. If you underwent a bowel prep for your procedure, you may not have a normal bowel movement for a few days.  Please Note:  You might notice some irritation and congestion in your nose or some drainage.  This is from the oxygen used during your procedure.  There is no need for concern and it should clear up in a day or so.  SYMPTOMS TO REPORT IMMEDIATELY:   Following upper endoscopy (EGD)  Vomiting of blood or coffee ground material  New chest pain or pain under the shoulder blades  Painful or persistently difficult swallowing  New shortness of breath  Fever of 100F or higher  Black, tarry-looking stools  For urgent or emergent issues, a gastroenterologist can be reached at any hour by calling [phone]. Do not use MyChart messaging for urgent concerns.    DIET:  We  do recommend a small meal at first, but then you may proceed to your regular diet.  Drink plenty of fluids but you should avoid alcoholic beverages for 24 hours.  ACTIVITY:  You should plan to take it easy for the rest of today and you should NOT DRIVE or use heavy machinery until tomorrow (because of the sedation medicines used during the test).    FOLLOW UP: Our staff will call the number listed on your records 48-72 hours following your procedure to check on you and address any questions or concerns that you may have regarding the information given to you following your procedure. If we do not reach you, we will leave a message.  We will attempt to reach you two times.  During this call, we will ask if you have developed any symptoms of COVID 19. If you develop any symptoms (ie: fever, flu-like symptoms, shortness of breath, cough etc.) before then, please call [phone].  If you test positive for Covid 19 in the 2 weeks post procedure, please call and report this information to [place].    If any biopsies were taken you will be contacted by phone or by letter within the next 1-3 weeks.  Please call us at [phone] if you have not heard about the biopsies in 3 weeks.    SIGNATURES/CONFIDENTIALITY: You and/or your care partner have signed paperwork which will be entered into your electronic medical record.  These signatures attest to the  fact that that the information above on your After Visit Summary has been reviewed and is understood.  Full responsibility of the confidentiality of this discharge information lies with you and/or your care-partner.

## 2021-06-26 NOTE — Op Note (Signed)
[facility] Patient Name: Jessica Davenport Procedure Date: 06/26/2021 3:15 PM MRN: [id number] Endoscopist: Thornton Park MD, MD Age: 55 Referring MD:  Date of Birth: 1965-09-01 Gender: Female Account #: [id number] Procedure:                Upper GI endoscopy Indications:              Epigastric abdominal pain Medicines:                Monitored Anesthesia Care Procedure:                Pre-Anesthesia Assessment:                           - Prior to the procedure, a History and Physical                            was performed, and patient medications and                            allergies were reviewed. The patient's tolerance of                            previous anesthesia was also reviewed. The risks                            and benefits of the procedure and the sedation                            options and risks were discussed with the patient.                            All questions were answered, and informed consent                            was obtained. Prior Anticoagulants: The patient has                            taken no previous anticoagulant or antiplatelet                            agents. ASA Grade Assessment: III - A patient with                            severe systemic disease. After reviewing the risks                            and benefits, the patient was deemed in                            satisfactory condition to undergo the procedure.                           After obtaining informed consent, the endoscope was  passed under direct vision. Throughout the                            procedure, the patient's blood pressure, pulse, and                            oxygen saturations were monitored continuously. The                            GIF [id number] #[id number] was introduced through the                            mouth, and advanced to the third part of duodenum.                            The upper GI endoscopy  was accomplished without                            difficulty. The patient tolerated the procedure                            well. Scope In: Scope Out: Findings:                 LA Grade A (one or more mucosal breaks less than 5                            mm, not extending between tops of 2 mucosal folds)                            esophagitis with no bleeding was found 38 cm from                            the incisors. Biopsies were taken from the                            mid/proximal and distal esophagus with a cold                            forceps for histology. Estimated blood loss was                            minimal.                           A small hiatal hernia was present.                           Patchy mild inflammation characterized by erythema,                            friability and granularity was found in the gastric                            body.  Biopsies were taken from the antrum, body,                            and fundus with a cold forceps for histology.                            Estimated blood loss was minimal.                           The examined duodenum was normal. Biopsies were                            taken with a cold forceps for histology. Estimated                            blood loss was minimal. Complications:            No immediate complications. Estimated blood loss:                            Minimal. Estimated Blood Loss:     Estimated blood loss was minimal. Impression:               - LA Grade A reflux esophagitis with no bleeding.                            Biopsied.                           - Small hiatal hernia.                           - Gastritis. Biopsied.                           - Normal examined duodenum. Biopsied. Recommendation:           - Patient has a contact number available for                            emergencies. The signs and symptoms of potential                            delayed complications were  discussed with the                            patient. Return to normal activities tomorrow.                            Written discharge instructions were provided to the                            patient.                           - Resume previous diet.                           -  Continue present medications.                           - Start omeprazole 40 mg BID for at least 10 weeks.                           - Await pathology results.                           - Avoid all NSAIDs. Thornton Park MD, MD 06/26/2021 4:07:12 PM This report has been signed electronically.

## 2021-06-26 NOTE — Progress Notes (Signed)
Called to room to assist during endoscopic procedure.  Patient ID and intended procedure confirmed with present staff. Received instructions for my participation in the procedure from the performing physician.  

## 2021-06-26 NOTE — Progress Notes (Signed)
Referring Provider: Janith Lima, MD Primary Care Physician:  Janith Lima, MD  Indication for Procedure:  Abdominal pain   IMPRESSION:  Epigastric pain -  Intermittent midabdominal pain previously associated change in bowel habits and anorexia not explained by abdominal ultrasound or contrasted CT scan  Appropriate candidate for monitored anesthesia care in the [facility]   PLAN: EGD with biopsies of the esophagus, gastric, and duodenum If endoscopic evaluation is negative we will proceed with evaluation for symptomatic gallbladder disease  Please see the "Patient Instructions" section for addition details about the plan.  HPI: Jessica Davenport is a 55 y.o. female seen initially seen 03/25/20 at the request of Dr. Ronnald Ramp for abdominal pain, nausea, and change in bowel habits. She presents today for upper endoscopy.  She has high blood pressure, obesity, anxiety, anemia in 2019, reflux, pneumonia in 2000 and ASD closure in 2005 at [facility]. EF >60% on Echocardiogram 02/23/19. 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  Previously followed by Dr. Olevia Perches - last seen in 2013. Seen for similar symptoms.   Seen 03/2020 for post-prandial RUQ abdominal pain with associated nausea and change in bowel habits. She described the pain as intermittently sharp, intermittently dull, intermittently achy, and frequently associated with abdominal wall pain overlying the right upper quadrant.  Associated anorexia, loose stools, and hyperosmia. No specific food triggers.  Temporally associated with stress.  No change with movement or defecation. No blood or mucous in the stool.   Endoscopic evaluation recommended but not scheduled.  Restarted Prilosec but her symptoms have not completely resolved. She is ready to proceed with endoscopic evaluation. She is busy taking care of her mother.   She has tried to avoid carbonated beverages.   Recent evaluation has included:  01/15/20 Abdominal films showed no obstruction.   01/17/20 CT abd/pelvis with contrast showed diverticulosis without diverticulitis, no acute abnormality 01/23/20 Abdominal ultrasound essentially normal   She had a Cologard 02/2019 that was normal. Does not want to have a colonoscopy.   Gallbladder disease in her mother and sister. She thinks a maternal uncle may have had colon cancer.  No other known family history of colon cancer or polyps. No family history of uterine/endometrial cancer, pancreatic cancer or gastric/stomach cancer.   Past Medical History:  Diagnosis Date   Abscess    Allergy    Bilateral lower extremity edema 12/02/2016   Essential hypertension    Fibroid, uterine    Infected sebaceous cyst    mons pubis   Iron deficiency anemia 08/19/2009        Lower extremity edema    Lumbar disc disease    Morbid obesity ([place])    Obesity, Class III, BMI 40-49.9 (morbid obesity) ([place]) 02/15/2012   Ostium secundum type atrial septal defect    s/p 28 mm Amplatzer device closure in 2005   Pulmonary HTN ([place]) 08/20/2009        Pure hypercholesterolemia 06/16/2012   Small bowel obstruction ([place]) 2013   Vitamin D deficiency     Past Surgical History:  Procedure Laterality Date   ABDOMINAL HYSTERECTOMY  12/07/11   ASD REPAIR  2005/2009   ENDOMETRIAL ABLATION  2010,2011,2012   TUBAL LIGATION  1996   uterine ablation      Current Outpatient Medications  Medication Sig Dispense Refill   albuterol (VENTOLIN HFA) 108 (90 Base) MCG/ACT inhaler Inhale 2 puffs into the lungs every 6 (six) hours as needed for wheezing or shortness of breath. 8 g 0  furosemide (LASIX) 80 MG tablet Take 1 tablet (80 mg total) by mouth daily. 90 tablet 3   indapamide (LOZOL) 1.25 MG tablet Take 1 tablet (1.25 mg total) by mouth daily. 90 tablet 0   olmesartan (BENICAR) 20 MG tablet TAKE ONE TABLET BY MOUTH DAILY 90 tablet 0   omeprazole (PRILOSEC) 20 MG capsule Take 40 mg by mouth daily.      traMADol (ULTRAM) 50 MG tablet Take 50 mg by mouth every 6 (six)  hours as needed.     clonazePAM (KLONOPIN) 1 MG tablet Take 1 tablet (1 mg total) by mouth 2 (two) times daily as needed for anxiety. 60 tablet 1   Eluxadoline (VIBERZI) 75 MG TABS Take 1 tablet by mouth in the morning and at bedtime. 60 tablet 5   EPINEPHrine 0.3 mg/0.3 mL IJ SOAJ injection Inject 0.3 mg into the muscle as needed for anaphylaxis.      potassium chloride SA (KLOR-CON) 20 MEQ tablet Take 2 tablets by mouth every AM and one tablet every PM 270 tablet 3   Current Facility-Administered Medications  Medication Dose Route Frequency Provider Last Rate Last Admin   0.9 %  sodium chloride infusion  500 mL Intravenous Once Thornton Park, MD        Allergies as of 06/26/2021 - Review Complete 06/26/2021  Allergen Reaction Noted   Lisinopril Hives 01/25/2019   Other  06/01/2012    Family History  Problem Relation Age of Onset   Coronary artery disease Mother    Hypertension Mother    Diabetes Mother    Breast cancer Mother    Coronary artery disease Father    Hypertension Father    Diabetes Father    Anxiety disorder Sister    Ovarian cancer Maternal Aunt    Breast cancer Maternal Aunt    Colon cancer Maternal Uncle    Cervical cancer Maternal Aunt      Physical Exam: General:   Alert,  well-nourished, pleasant and cooperative in NAD Head:  Normocephalic and atraumatic. Eyes:  Sclera clear, no icterus.   Conjunctiva pink. Abdomen:  Soft, nontender, nondistended, normal bowel sounds, no rebound or guarding. No hepatosplenomegaly.   Neurologic:  Alert and  oriented x4;  grossly nonfocal Skin:  Intact without significant lesions or rashes. Psych:  Alert and cooperative. Normal mood and affect.    Arijana Narayan L. Tarri Glenn, MD, MPH 06/26/2021, 3:42 PM

## 2021-06-26 NOTE — Progress Notes (Signed)
VS  DT ? ?Pt's states no medical or surgical changes since previsit or office visit. ? ?

## 2021-06-30 ENCOUNTER — Telehealth: Payer: Self-pay

## 2021-06-30 NOTE — Telephone Encounter (Signed)
  Follow up Call-  Call back number 06/26/2021  Post procedure Call Back phone  # [phone]  Permission to leave phone message Yes  Some recent data might be hidden     Patient questions:  Do you have a fever, pain , or abdominal swelling? No. Pain Score  0 *  Have you tolerated food without any problems? Yes.    Have you been able to return to your normal activities? Yes.    Do you have any questions about your discharge instructions: Diet   No. Medications  No. Follow up visit  No.  Do you have questions or concerns about your Care? No.  Actions: * If pain score is 4 or above: No action needed, pain <4.

## 2021-07-02 ENCOUNTER — Encounter: Payer: Self-pay | Admitting: Internal Medicine

## 2021-07-02 ENCOUNTER — Ambulatory Visit: Payer: 59 | Admitting: Internal Medicine

## 2021-07-02 ENCOUNTER — Other Ambulatory Visit: Payer: Self-pay

## 2021-07-02 VITALS — BP 134/82 | HR 72 | Temp 98.6°F | Ht 67.0 in | Wt 306.0 lb

## 2021-07-02 DIAGNOSIS — Z23 Encounter for immunization: Secondary | ICD-10-CM

## 2021-07-02 DIAGNOSIS — J020 Streptococcal pharyngitis: Secondary | ICD-10-CM

## 2021-07-02 DIAGNOSIS — R7303 Prediabetes: Secondary | ICD-10-CM

## 2021-07-02 DIAGNOSIS — I10 Essential (primary) hypertension: Secondary | ICD-10-CM

## 2021-07-02 DIAGNOSIS — Z0001 Encounter for general adult medical examination with abnormal findings: Secondary | ICD-10-CM

## 2021-07-02 DIAGNOSIS — E538 Deficiency of other specified B group vitamins: Secondary | ICD-10-CM | POA: Diagnosis not present

## 2021-07-02 DIAGNOSIS — F411 Generalized anxiety disorder: Secondary | ICD-10-CM

## 2021-07-02 DIAGNOSIS — E559 Vitamin D deficiency, unspecified: Secondary | ICD-10-CM

## 2021-07-02 DIAGNOSIS — E7800 Pure hypercholesterolemia, unspecified: Secondary | ICD-10-CM

## 2021-07-02 LAB — BASIC METABOLIC PANEL
BUN: 12 mg/dL (ref 6–23)
CO2: 32 mEq/L (ref 19–32)
Calcium: 10 mg/dL (ref 8.4–10.5)
Chloride: 101 mEq/L (ref 96–112)
Creatinine, Ser: 0.75 mg/dL (ref 0.40–1.20)
GFR: 91 mL/min (ref >60.00)
Glucose, Bld: 88 mg/dL (ref 70–99)
Potassium: 3.9 mEq/L (ref 3.5–5.1)
Sodium: 139 mEq/L (ref 135–145)

## 2021-07-02 LAB — LIPID PANEL
Cholesterol: 229 mg/dL — ABNORMAL HIGH (ref 0–200)
HDL: 61.3 mg/dL (ref >39.00)
LDL Cholesterol: 150 mg/dL — ABNORMAL HIGH (ref 0–99)
NonHDL: 168.06
Total CHOL/HDL Ratio: 4
Triglycerides: 90 mg/dL (ref 0.0–149.0)
VLDL: 18 mg/dL (ref 0.0–40.0)

## 2021-07-02 LAB — CBC WITH DIFFERENTIAL/PLATELET
Basophils Absolute: 0 10*3/uL (ref 0.0–0.1)
Basophils Relative: 0.5 % (ref 0.0–3.0)
Eosinophils Absolute: 0.1 10*3/uL (ref 0.0–0.7)
Eosinophils Relative: 1.5 % (ref 0.0–5.0)
HCT: 38.8 % (ref 36.0–46.0)
Hemoglobin: 12.5 g/dL (ref 12.0–15.0)
Lymphocytes Relative: 39.5 % (ref 12.0–46.0)
Lymphs Abs: 3.3 10*3/uL (ref 0.7–4.0)
MCHC: 32.3 g/dL (ref 30.0–36.0)
MCV: 86 fl (ref 78.0–100.0)
Monocytes Absolute: 0.4 10*3/uL (ref 0.1–1.0)
Monocytes Relative: 4.6 % (ref 3.0–12.0)
Neutro Abs: 4.5 10*3/uL (ref 1.4–7.7)
Neutrophils Relative %: 53.9 % (ref 43.0–77.0)
Platelets: 312 10*3/uL (ref 150.0–400.0)
RBC: 4.51 Mil/uL (ref 3.87–5.11)
RDW: 14.2 % (ref 11.5–15.5)
WBC: 8.4 10*3/uL (ref 4.0–10.5)

## 2021-07-02 LAB — HEPATIC FUNCTION PANEL
ALT: 10 U/L (ref 0–35)
AST: 12 U/L (ref 0–37)
Albumin: 4.1 g/dL (ref 3.5–5.2)
Alkaline Phosphatase: 75 U/L (ref 39–117)
Bilirubin, Direct: 0.1 mg/dL (ref 0.0–0.3)
Total Bilirubin: 0.4 mg/dL (ref 0.2–1.2)
Total Protein: 8.2 g/dL (ref 6.0–8.3)

## 2021-07-02 LAB — URINALYSIS, ROUTINE W REFLEX MICROSCOPIC
Bilirubin Urine: NEGATIVE
Hgb urine dipstick: NEGATIVE
Ketones, ur: NEGATIVE
Leukocytes,Ua: NEGATIVE
Nitrite: NEGATIVE
RBC / HPF: NONE SEEN (ref 0–?)
Specific Gravity, Urine: 1.015 (ref 1.000–1.030)
Total Protein, Urine: NEGATIVE
Urine Glucose: NEGATIVE
Urobilinogen, UA: 0.2 (ref 0.0–1.0)
pH: 6.5 (ref 5.0–8.0)

## 2021-07-02 LAB — VITAMIN D 25 HYDROXY (VIT D DEFICIENCY, FRACTURES): VITD: 9.19 ng/mL — ABNORMAL LOW (ref 30.00–100.00)

## 2021-07-02 LAB — VITAMIN B12: Vitamin B-12: 535 pg/mL (ref 211–911)

## 2021-07-02 LAB — TSH: TSH: 1.88 u[IU]/mL (ref 0.35–5.50)

## 2021-07-02 LAB — HEMOGLOBIN A1C: Hgb A1c MFr Bld: 6.3 % (ref 4.6–6.5)

## 2021-07-02 MED ORDER — POTASSIUM CHLORIDE CRYS ER 20 MEQ PO TBCR: EXTENDED_RELEASE_TABLET | ORAL | 3 refills | Status: DC

## 2021-07-02 MED ORDER — CLONAZEPAM 1 MG PO TABS: 1.0000 mg | ORAL_TABLET | Freq: Two times a day (BID) | ORAL | 1 refills | Status: DC | PRN

## 2021-07-02 MED ORDER — OLMESARTAN MEDOXOMIL 20 MG PO TABS: 20.0000 mg | ORAL_TABLET | Freq: Every day | ORAL | 3 refills | Status: DC

## 2021-07-02 NOTE — Assessment & Plan Note (Signed)
Resolved, ok to follow, reassured

## 2021-07-02 NOTE — Progress Notes (Addendum)
Patient ID: Jessica Davenport, female   DOB: 05-17-1966, 55 y.o.   MRN: [id number]         Chief Complaint:: wellness exam and recent strep throat, lo vit d, hld, htn, anxiety - PCP not here today       HPI:  Jessica Davenport is a 55 y.o. female here for wellness exam plans to call dr cousins soon for GYN and mammogram, decilnes shiingrix, covid booster, but ok for pneumovax and flu shot, o/w up to date                        Also mother died dec 67.  Private autopsy ordered. Grieving, some difficult sleep most nights since then as this was unexpected though in advanced age in NH.  Pt denies chest pain, increased sob or doe, wheezing, orthopnea, PND, increased LE swelling, palpitations, dizziness or syncope.   Pt denies polydipsia, polyuria, or new focal neuro s/s.   Pt denies fever, wt loss, night sweats, loss of appetite, or other constitutional symptoms  Denies worsening depressive symptoms, suicidal ideation, or panic   Not taking Vit D.   Wt Readings from Last 3 Encounters:  07/02/21 (!) 306 lb (138.8 kg)  06/26/21 (!) 305 lb (138.3 kg)  06/17/21 (!) 305 lb (138.3 kg)   BP Readings from Last 3 Encounters:  07/02/21 134/82  06/26/21 126/75  06/17/21 140/90   Immunization History  Administered Date(s) Administered   Influenza,inj,Quad PF,6+ Mos 08/27/2019, 07/02/2021   Moderna Sars-Covid-2 Vaccination 04/07/2020, 05/15/2020   Pneumococcal Polysaccharide-23 07/02/2021   Td 08/19/2009   Tdap 08/27/2019   There are no preventive care reminders to display for this patient.     Past Medical History:  Diagnosis Date   Abscess    Allergy    Bilateral lower extremity edema 12/02/2016   Essential hypertension    Fibroid, uterine    Infected sebaceous cyst    mons pubis   Iron deficiency anemia 08/19/2009        Lower extremity edema    Lumbar disc disease    Morbid obesity ([place])    Obesity, Class III, BMI 40-49.9 (morbid obesity) ([place]) 02/15/2012   Ostium secundum type atrial septal  defect    s/p 28 mm Amplatzer device closure in 2005   Pulmonary HTN ([place]) 08/20/2009        Pure hypercholesterolemia 06/16/2012   Small bowel obstruction ([place]) 2013   Vitamin D deficiency    Past Surgical History:  Procedure Laterality Date   ABDOMINAL HYSTERECTOMY  12/07/11   ASD REPAIR  2005/2009   ENDOMETRIAL ABLATION  2010,2011,2012   TUBAL LIGATION  1996   uterine ablation      reports that she has never smoked. She has never used smokeless tobacco. She reports that she does not drink alcohol and does not use drugs. family history includes Anxiety disorder in her sister; Breast cancer in her maternal aunt and mother; Cervical cancer in her maternal aunt; Colon cancer in her maternal uncle; Coronary artery disease in her father and mother; Diabetes in her father and mother; Hypertension in her father and mother; Ovarian cancer in her maternal aunt. Allergies  Allergen Reactions   Lisinopril Hives   Other     Celery and green peas   Current Outpatient Medications on File Prior to Visit  Medication Sig Dispense Refill   albuterol (VENTOLIN HFA) 108 (90 Base) MCG/ACT inhaler Inhale 2 puffs into the lungs every 6 (  six) hours as needed for wheezing or shortness of breath. 8 g 0   Eluxadoline (VIBERZI) 75 MG TABS Take 1 tablet by mouth in the morning and at bedtime. 60 tablet 5   EPINEPHrine 0.3 mg/0.3 mL IJ SOAJ injection Inject 0.3 mg into the muscle as needed for anaphylaxis.      furosemide (LASIX) 80 MG tablet Take 1 tablet (80 mg total) by mouth daily. 90 tablet 3   omeprazole (PRILOSEC) 40 MG capsule Take 1 capsule (40 mg total) by mouth 2 (two) times daily. 140 capsule 0   traMADol (ULTRAM) 50 MG tablet Take 50 mg by mouth every 6 (six) hours as needed.     [DISCONTINUED] omeprazole (PRILOSEC) 20 MG capsule Take 40 mg by mouth in the morning and at bedtime.     No current facility-administered medications on file prior to visit.        ROS:  All others reviewed and  negative.  Objective        PE:  BP 134/82 (BP Location: Right Arm, Patient Position: Sitting, Cuff Size: Large)    Pulse 72    Temp 98.6 F (37 C) (Oral)    Ht 5\' 7"  (1.702 m)    Wt (!) 306 lb (138.8 kg)    LMP 11/10/2011    SpO2 100%    BMI 47.93 kg/m                 Constitutional: Pt appears in NAD               HENT: Head: NCAT.                Right Ear: External ear normal.                 Left Ear: External ear normal.                Eyes: . Pupils are equal, round, and reactive to light. Conjunctivae and EOM are normal               Nose: without d/c or deformity               Neck: Neck supple. Gross normal ROM               Cardiovascular: Normal rate and regular rhythm.                 Pulmonary/Chest: Effort normal and breath sounds without rales or wheezing.                Abd:  Soft, NT, ND, + BS, no organomegaly               Neurological: Pt is alert. At baseline orientation, motor grossly intact               Skin: Skin is warm. No rashes, no other new lesions, LE edema - none               Psychiatric: Pt behavior is normal without agitation , grieving  Micro: none  Cardiac tracings I have personally interpreted today:  none  Pertinent Radiological findings (summarize): none   Lab Results  Component Value Date   WBC 8.4 07/02/2021   HGB 12.5 07/02/2021   HCT 38.8 07/02/2021   PLT 312.0 07/02/2021   GLUCOSE 88 07/02/2021   CHOL 229 (H) 07/02/2021   TRIG 90.0 07/02/2021   HDL 61.30 07/02/2021   LDLDIRECT 148.1 06/16/2012  LDLCALC 150 (H) 07/02/2021   ALT 10 07/02/2021   AST 12 07/02/2021   NA 139 07/02/2021   K 3.9 07/02/2021   CL 101 07/02/2021   CREATININE 0.75 07/02/2021   BUN 12 07/02/2021   CO2 32 07/02/2021   TSH 1.88 07/02/2021   HGBA1C 6.3 07/02/2021   Assessment/Plan:  Jessica Davenport is a 55 y.o. Black or African American [2] Other or two or more races [6] female with  has a past medical history of Abscess, Allergy, Bilateral lower  extremity edema (12/02/2016), Essential hypertension, Fibroid, uterine, Infected sebaceous cyst, Iron deficiency anemia (08/19/2009), Lower extremity edema, Lumbar disc disease, Morbid obesity ([place]), Obesity, Class III, BMI 40-49.9 (morbid obesity) ([place]) (02/15/2012), Ostium secundum type atrial septal defect, Pulmonary HTN ([place]) (08/20/2009), Pure hypercholesterolemia (06/16/2012), Small bowel obstruction ([place]) (2013), and Vitamin D deficiency.  Vitamin D deficiency Last vitamin D Lab Results  Component Value Date   VD25OH 17.18 (L) 09/24/2019   To start oral replacement   Strep throat Resolved, ok to follow, reassured  Encounter for well adult exam with abnormal findings Age and sex appropriate education and counseling updated with regular exercise and diet Referrals for preventative services - to call GYN for pap, mammogram Immunizations addressed - declines shingrix, and covid booster, ok for flu shot, pneumovax Smoking counseling  - none needed Evidence for depression or other mood disorder - grieving, but no worsening depression Most recent labs reviewed. I have personally reviewed and have noted: 1) the patient's medical and social history 2) The patient's current medications and supplements 3) The patient's height, weight, and BMI have been recorded in the chart   Pure hypercholesterolemia Lab Results  Component Value Date   LDLCALC 150 (H) 07/02/2021   Severe uncontrolled, pt to continue current lower chol diet, declines statin for now   Essential hypertension BP Readings from Last 3 Encounters:  07/02/21 134/82  06/26/21 126/75  06/17/21 140/90   Stable, pt to continue medical treatment benicar 20   GAD (generalized anxiety disorder) Overall stable, cont same tx - klonopin, viberzi  Followup: Return in about 6 months (around 12/31/2021).  Cathlean Cower, MD 07/05/2021 6:34 PM [facility]

## 2021-07-02 NOTE — Patient Instructions (Addendum)
You had flu shot and the pneumovax pneumonia shot today  Please continue all other medications as before, and refills have been done if requested.  Please have the pharmacy call with any other refills you may need.  Please continue your efforts at being more active, low cholesterol diet, and weight control.  You are otherwise up to date with prevention measures today.  Please keep your appointments with your specialists as you may have planned  Please go to the LAB at the blood drawing area for the tests to be done  You will be contacted by phone if any changes need to be made immediately.  Otherwise, you will receive a letter about your results with an explanation, but please check with MyChart first.  Please remember to sign up for MyChart if you have not done so, as this will be important to you in the future with finding out test results, communicating by private email, and scheduling acute appointments online when needed.  Please make an Appointment to return in 6 months, or sooner if needed to Dr Ronnald Ramp

## 2021-07-02 NOTE — Assessment & Plan Note (Signed)
Last vitamin D Lab Results  Component Value Date   VD25OH 17.18 (L) 09/24/2019   To start oral replacement

## 2021-07-05 ENCOUNTER — Encounter: Payer: Self-pay | Admitting: Internal Medicine

## 2021-07-05 NOTE — Assessment & Plan Note (Addendum)
Age and sex appropriate education and counseling updated with regular exercise and diet Referrals for preventative services - to call GYN for pap, mammogram Immunizations addressed - declines shingrix, and covid booster, ok for flu shot, pneumovax Smoking counseling  - none needed Evidence for depression or other mood disorder - grieving, but no worsening depression Most recent labs reviewed. I have personally reviewed and have noted: 1) the patient's medical and social history 2) The patient's current medications and supplements 3) The patient's height, weight, and BMI have been recorded in the chart

## 2021-07-05 NOTE — Assessment & Plan Note (Signed)
Lab Results  Component Value Date   LDLCALC 150 (H) 07/02/2021   Severe uncontrolled, pt to continue current lower chol diet, declines statin for now

## 2021-07-05 NOTE — Assessment & Plan Note (Signed)
Overall stable, cont same tx - klonopin, viberzi

## 2021-07-05 NOTE — Assessment & Plan Note (Signed)
BP Readings from Last 3 Encounters:  07/02/21 134/82  06/26/21 126/75  06/17/21 140/90   Stable, pt to continue medical treatment benicar 20

## 2021-07-06 ENCOUNTER — Other Ambulatory Visit: Payer: Self-pay

## 2021-07-06 ENCOUNTER — Telehealth: Payer: Self-pay | Admitting: Gastroenterology

## 2021-07-06 DIAGNOSIS — A048 Other specified bacterial intestinal infections: Secondary | ICD-10-CM

## 2021-07-06 DIAGNOSIS — R1013 Epigastric pain: Secondary | ICD-10-CM

## 2021-07-06 MED ORDER — TETRACYCLINE HCL 500 MG PO CAPS
500.0000 mg | ORAL_CAPSULE | Freq: Four times a day (QID) | ORAL | 0 refills | Status: AC
Start: 2021-07-06 — End: 2021-07-20

## 2021-07-06 MED ORDER — OMEPRAZOLE 40 MG PO CPDR: 40.0000 mg | DELAYED_RELEASE_CAPSULE | Freq: Two times a day (BID) | ORAL | 0 refills | Status: DC

## 2021-07-06 MED ORDER — METRONIDAZOLE 500 MG PO TABS: 500.0000 mg | ORAL_TABLET | Freq: Four times a day (QID) | ORAL | 0 refills | Status: AC

## 2021-07-06 MED ORDER — BISMUTH SUBSALICYLATE 262 MG PO CHEW
262.0000 mg | CHEWABLE_TABLET | Freq: Four times a day (QID) | ORAL | 0 refills | Status: AC
Start: 2021-07-06 — End: 2021-07-20

## 2021-07-06 MED ORDER — OMEPRAZOLE 40 MG PO CPDR: 40.0000 mg | DELAYED_RELEASE_CAPSULE | Freq: Every day | ORAL | 3 refills | Status: DC

## 2021-07-06 NOTE — Telephone Encounter (Signed)
Patient is returning your call.  

## 2021-07-06 NOTE — Telephone Encounter (Signed)
Addressed in previously created encounter 

## 2021-07-16 ENCOUNTER — Encounter: Payer: Self-pay | Admitting: Internal Medicine

## 2021-07-16 MED ORDER — NIRMATRELVIR/RITONAVIR (PAXLOVID)TABLET: 3.0000 | ORAL_TABLET | Freq: Two times a day (BID) | ORAL | 0 refills | Status: AC

## 2021-07-17 ENCOUNTER — Other Ambulatory Visit: Payer: Self-pay

## 2021-07-17 ENCOUNTER — Telehealth (INDEPENDENT_AMBULATORY_CARE_PROVIDER_SITE_OTHER): Payer: 59 | Admitting: Internal Medicine

## 2021-07-17 ENCOUNTER — Telehealth: Payer: 59 | Admitting: Internal Medicine

## 2021-07-17 ENCOUNTER — Encounter: Payer: Self-pay | Admitting: Internal Medicine

## 2021-07-17 DIAGNOSIS — U071 COVID-19: Secondary | ICD-10-CM | POA: Insufficient documentation

## 2021-07-17 MED ORDER — ALBUTEROL SULFATE HFA 108 (90 BASE) MCG/ACT IN AERS: 1.0000 | INHALATION_SPRAY | Freq: Four times a day (QID) | RESPIRATORY_TRACT | 0 refills | Status: DC | PRN

## 2021-07-17 MED ORDER — PROMETHAZINE-DM 6.25-15 MG/5ML PO SYRP: 5.0000 mL | ORAL_SOLUTION | Freq: Four times a day (QID) | ORAL | 0 refills | Status: DC | PRN

## 2021-07-17 NOTE — Progress Notes (Signed)
Virtual Visit via Video Note  I connected with Jessica Davenport on 07/17/21 at 11:00 AM EST by a video enabled telemedicine application and verified that I am speaking with the correct person using two identifiers.  The patient and the provider were at separate locations throughout the entire encounter. Patient location: home, Provider location: work   I discussed the limitations of evaluation and management by telemedicine and the availability of in person appointments. The patient expressed understanding and agreed to proceed. The patient and the provider were the only parties present for the visit unless noted in HPI below.  History of Present Illness: The patient is a 55 y.o. female with visit for covid-19. Has gotten paxlovid this morning and has not started yet. Severe cough and some SOB.   Work note 07/10/21 to 07/15/21   Observations/Objective: Appearance: sick, breathing appears normal, voice hoarse, coughing during visit, casual grooming, mental status is A and O times 3  Assessment and Plan: See problem oriented charting  Follow Up Instructions: rx promethazine/dm and albuterol inhaler  I discussed the assessment and treatment plan with the patient. The patient was provided an opportunity to ask questions and all were answered. The patient agreed with the plan and demonstrated an understanding of the instructions.   The patient was advised to call back or seek an in-person evaluation if the symptoms worsen or if the condition fails to improve as anticipated.  Hoyt Koch, MD

## 2021-07-17 NOTE — Assessment & Plan Note (Signed)
She is instructed to take paxlovid starting today. Rx promethazine/dm cough syrup and albuterol inhaler. Work note done.

## 2021-07-21 ENCOUNTER — Encounter: Payer: Self-pay | Admitting: Gastroenterology

## 2021-07-21 ENCOUNTER — Other Ambulatory Visit: Payer: Self-pay

## 2021-07-21 DIAGNOSIS — K580 Irritable bowel syndrome with diarrhea: Secondary | ICD-10-CM

## 2021-07-21 MED ORDER — VIBERZI 75 MG PO TABS: 1.0000 | ORAL_TABLET | Freq: Two times a day (BID) | ORAL | 5 refills | Status: DC

## 2021-07-22 ENCOUNTER — Other Ambulatory Visit: Payer: 59

## 2021-07-22 ENCOUNTER — Ambulatory Visit: Payer: 59 | Admitting: Internal Medicine

## 2021-07-22 ENCOUNTER — Telehealth: Payer: Self-pay

## 2021-07-22 NOTE — Telephone Encounter (Signed)
PRIOR AUTHORIZATION  PA initiation date: 07/22/21  Medication: [employer]: [facility] Submission completed electronically through [employer] My Meds: Yes  Will await insurance response re: approval/denial.  Sports administrator (Key: [id number]) Viberzi 75MG  tablets   Form [facility] Electronic Prior Authorization Form (2017 NCPDP) Created 2 minutes ago Sent to Plan 1 minute ago Plan Response 1 minute ago Submit Clinical Questions less than a minute ago Determination Wait for Determination Please wait for [facility] 2017 NCPDP to return a determination.

## 2021-07-22 NOTE — Telephone Encounter (Signed)
APPROVAL  Medication: [employer]: [facility] PA response: Approved Approval dates: 07/22/21 through 01/19/22 Misc. Notes: Jessica Davenport (Key: [id number])  This request has received a Favorable outcome.  Please note any additional information provided by [facility] at the bottom of your screen. Jessica Davenport (Key: [id number]) Viberzi 75MG  tablets   Form [facility] Electronic Prior Authorization Form (2017 NCPDP) Created 7 minutes ago Sent to Plan 6 minutes ago Plan Response 6 minutes ago Submit Clinical Questions 5 minutes ago Determination Favorable 2 minutes ago Message from Plan Request Reference Number: [id number]. VIBERZI TAB 75MG  is approved through 01/19/2022. Your patient may now fill this prescription and it will be covered.

## 2021-07-29 ENCOUNTER — Other Ambulatory Visit: Payer: 59

## 2021-07-29 DIAGNOSIS — G8929 Other chronic pain: Secondary | ICD-10-CM

## 2021-07-29 DIAGNOSIS — A048 Other specified bacterial intestinal infections: Secondary | ICD-10-CM

## 2021-07-30 LAB — HELICOBACTER PYLORI  SPECIAL ANTIGEN
MICRO NUMBER:: 12857150
SPECIMEN QUALITY: ADEQUATE

## 2021-07-31 ENCOUNTER — Other Ambulatory Visit: Payer: Self-pay

## 2021-07-31 DIAGNOSIS — A048 Other specified bacterial intestinal infections: Secondary | ICD-10-CM

## 2021-08-29 IMAGING — US US ABDOMEN LIMITED
1 series · 14 of 25 positions shown · non-contrast
Comparison: None.

CLINICAL DATA: Chronic right upper quadrant abdominal pain

EXAM:
ULTRASOUND ABDOMEN LIMITED RIGHT UPPER QUADRANT

[Series 1: us abdomen limited · 0.14mm/px · 14 of 43 slices shown]
[im 1/43]
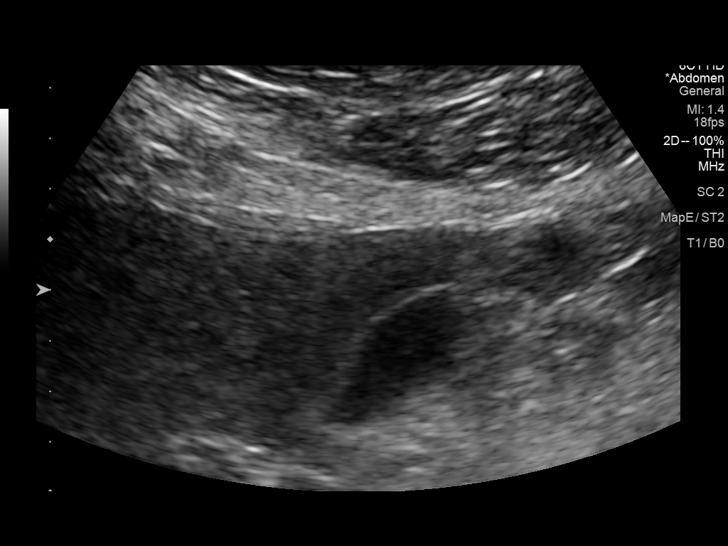
[im 4/43]
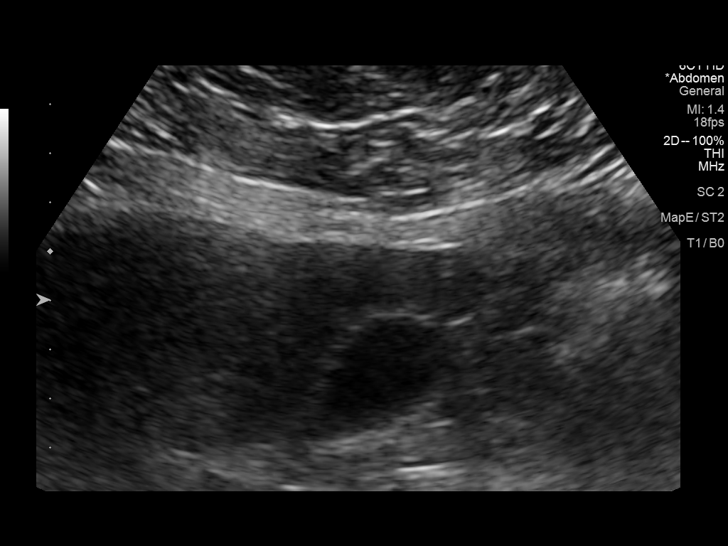
[im 8/43]
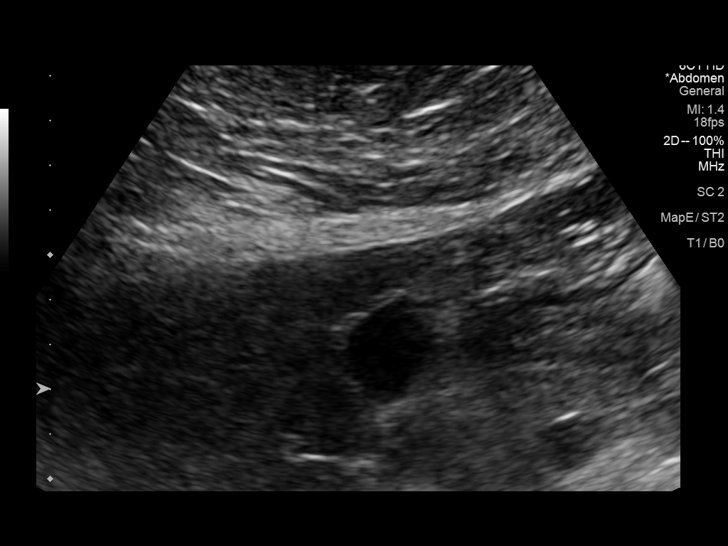
[im 11/43]
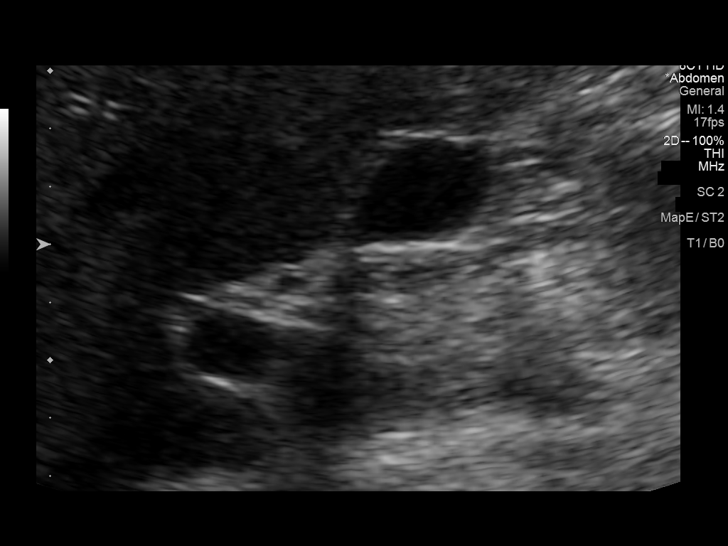
[im 15/43]
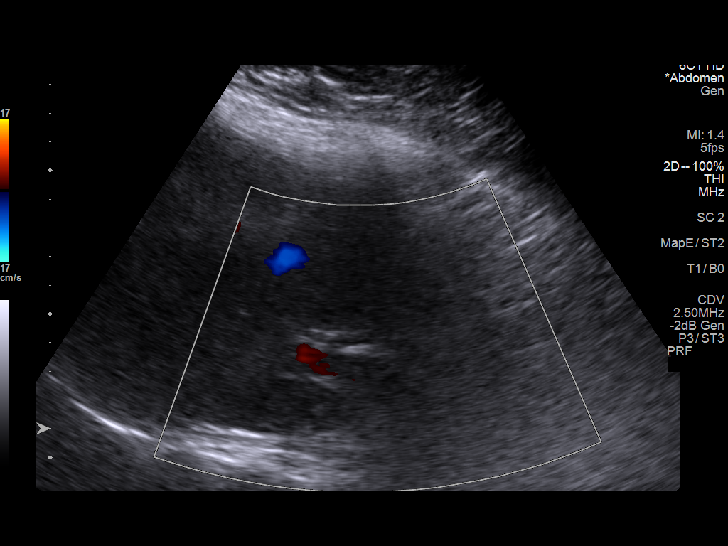
[im 16/43]
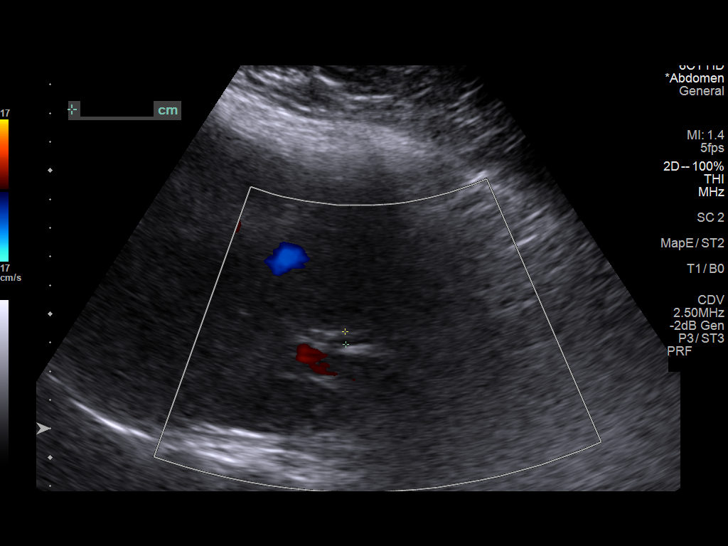
[im 20/43]
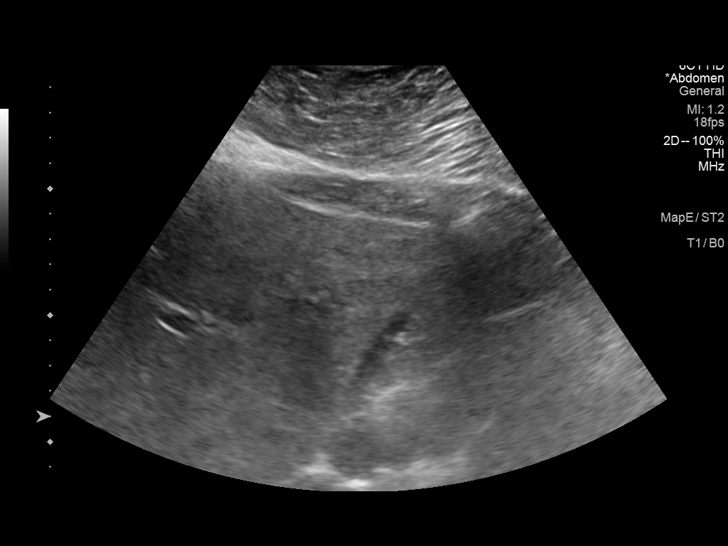
[im 23/43]
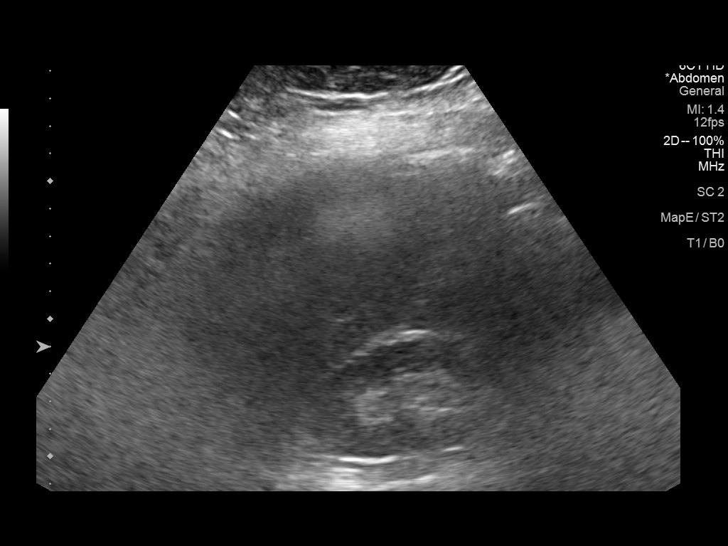
[im 27/43]
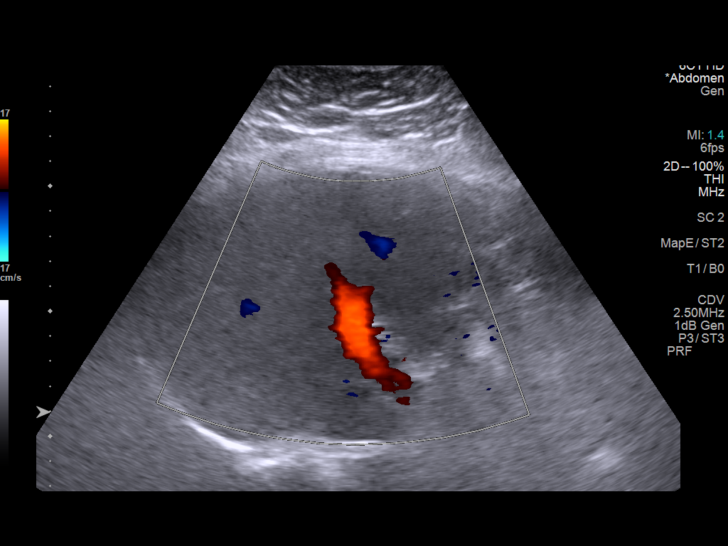
[im 29/43]
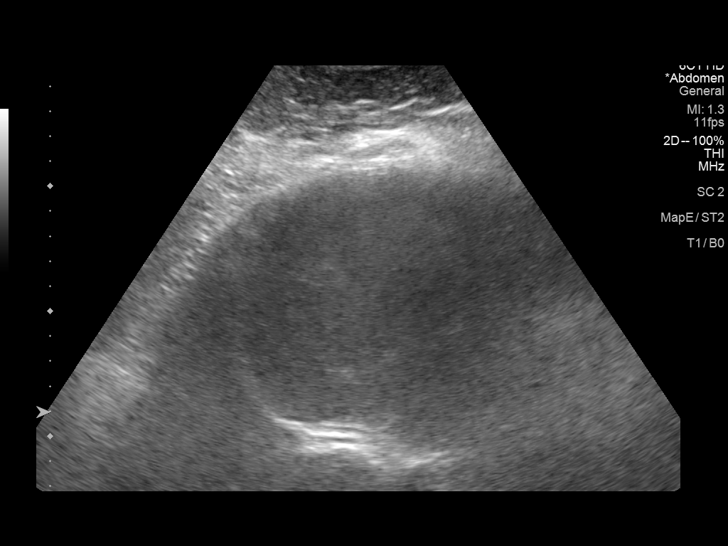
[im 32/43]
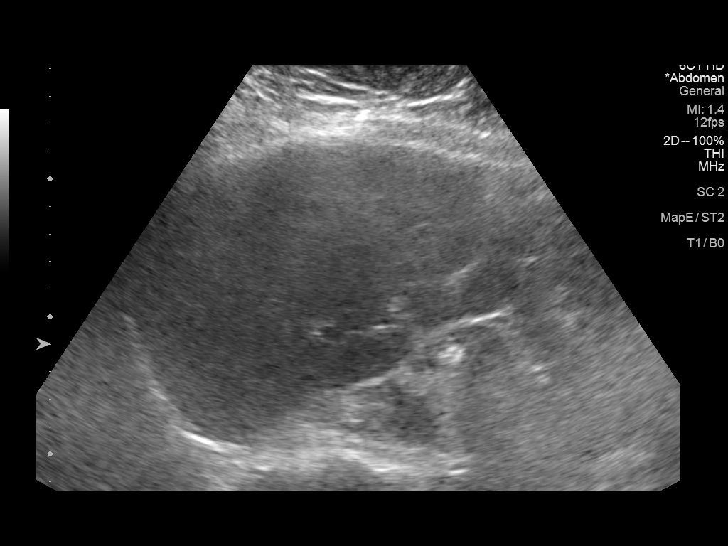
[im 36/43]
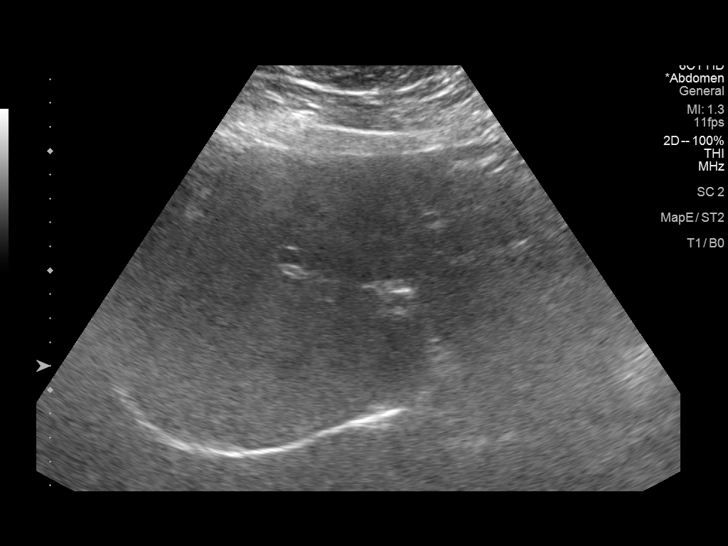
[im 39/43]
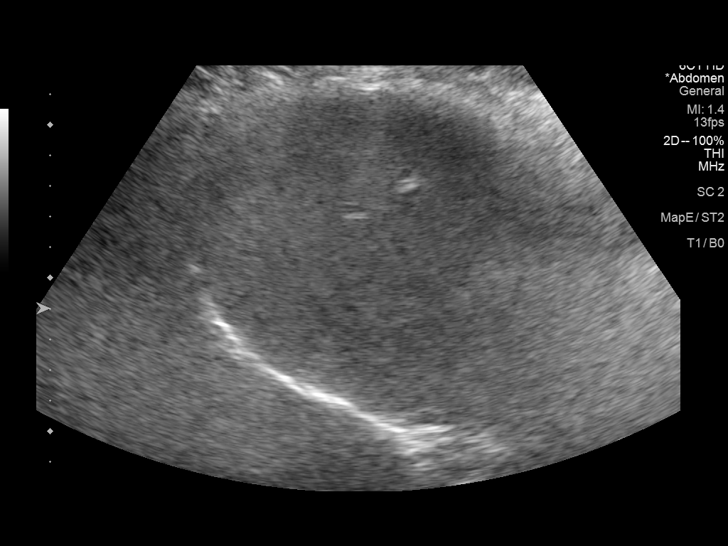
[im 43/43]
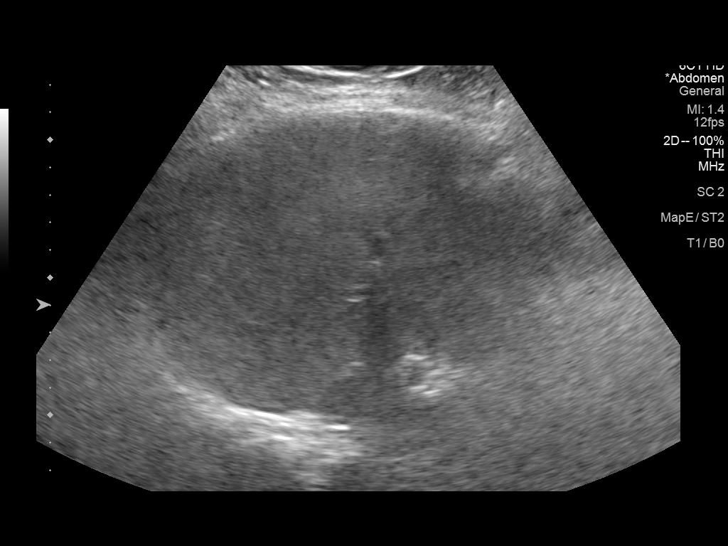

[14 of 25 positions shown; findings below may reference images not displayed]

FINDINGS: Gallbladder:

No gallstones or wall thickening visualized. No sonographic Murphy
sign noted by sonographer.

Common bile duct:

Diameter: 5 mm in proximal diameter. The mid and distal duct are
obscured by overlying bowel gas.

Liver:

Evaluation of the liver parenchyma is slightly limited by the
patient's body habitus and resultant acoustic attenuation. No focal
lesion identified. Within normal limits in parenchymal echogenicity.
Portal vein is patent on color Doppler imaging with normal direction
of blood flow towards the liver.

Other: No ascites
IMPRESSION: Slightly limited but unremarkable examination

## 2021-09-07 ENCOUNTER — Telehealth: Payer: Self-pay | Admitting: Gastroenterology

## 2021-09-07 ENCOUNTER — Other Ambulatory Visit: Payer: 59

## 2021-09-07 DIAGNOSIS — A048 Other specified bacterial intestinal infections: Secondary | ICD-10-CM

## 2021-09-07 NOTE — Telephone Encounter (Signed)
Patient called to inform you that she dropped of the stool sample to the lab today.

## 2021-09-08 LAB — HELICOBACTER PYLORI  SPECIAL ANTIGEN
MICRO NUMBER:: 13031362
SPECIMEN QUALITY: ADEQUATE

## 2021-09-10 ENCOUNTER — Other Ambulatory Visit: Payer: Self-pay | Admitting: Gastroenterology

## 2021-09-10 DIAGNOSIS — G8929 Other chronic pain: Secondary | ICD-10-CM

## 2021-09-15 ENCOUNTER — Other Ambulatory Visit: Payer: Self-pay | Admitting: Internal Medicine

## 2021-09-15 DIAGNOSIS — I10 Essential (primary) hypertension: Secondary | ICD-10-CM

## 2021-09-28 ENCOUNTER — Telehealth: Payer: Self-pay | Admitting: *Deleted

## 2021-09-28 NOTE — Telephone Encounter (Signed)
Submitted PA via [employer]mymeds. Viberzi already approved starting 07/22/21-01/19/22. Faxed approval to pharmacy again.  ?

## 2021-10-26 IMAGING — CT CT HEAD W/O CM
4 series · 16 of 47 positions shown, 18 images · non-contrast
Comparison: None.

CLINICAL DATA: Headache, new or worsening.

EXAM:
CT HEAD WITHOUT CONTRAST
TECHNIQUE: Contiguous axial images were obtained from the base of the skull
through the vertex without intravenous contrast.

[Series 3: head without · axial · non-contrast · 0.46mm/px · z∈[-124,-9]mm · 7 of 31 slices shown, 9 images]
[im 4/31  brain]
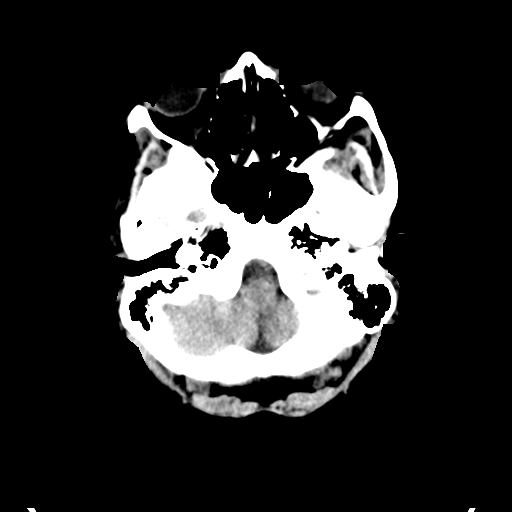
[im 4/31  bone]
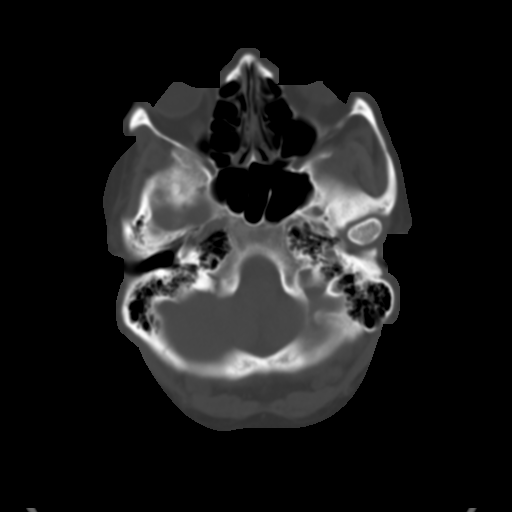
[im 8/31  brain]
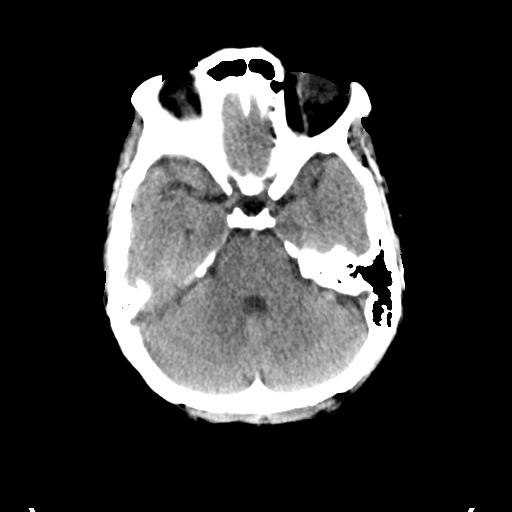
[im 12/31  brain]
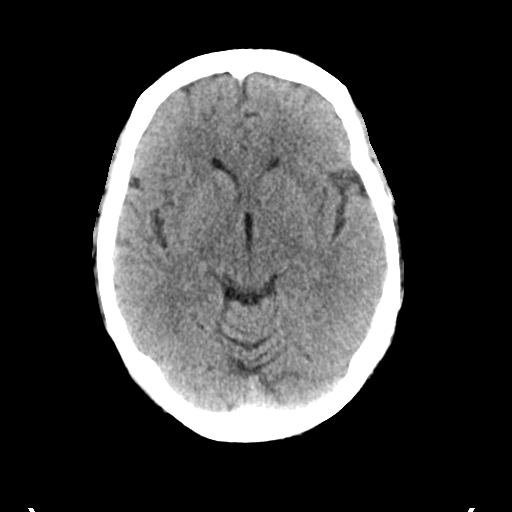
[im 16/31  brain]
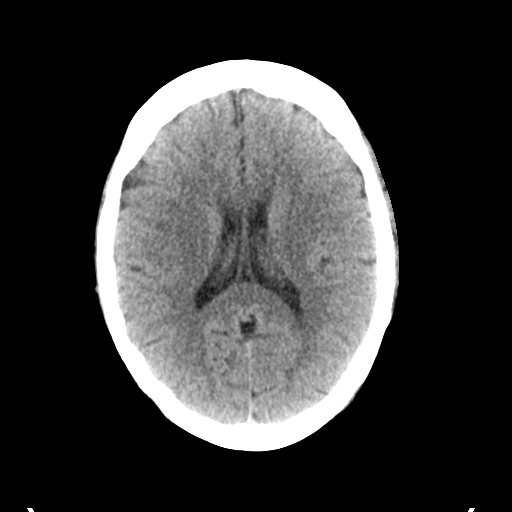
[im 19/31  brain]
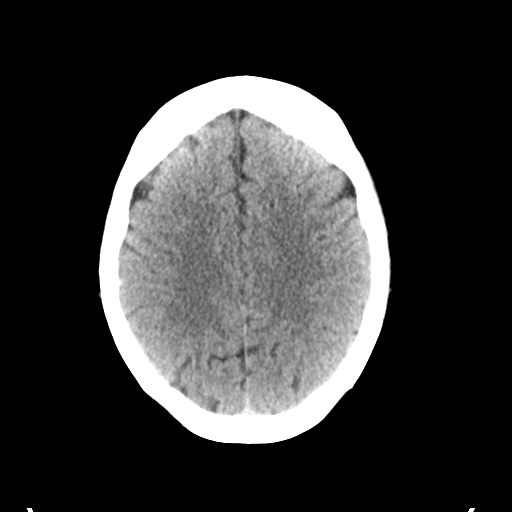
[im 19/31  bone]
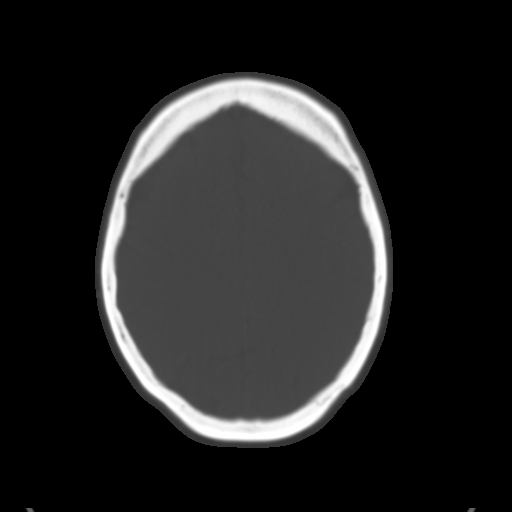
[im 23/31  brain]
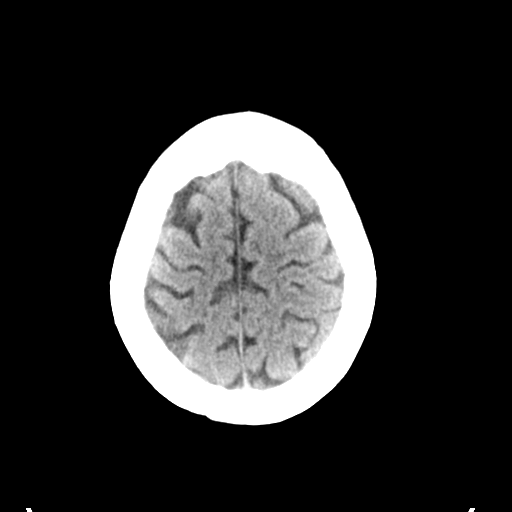
[im 27/31  brain]
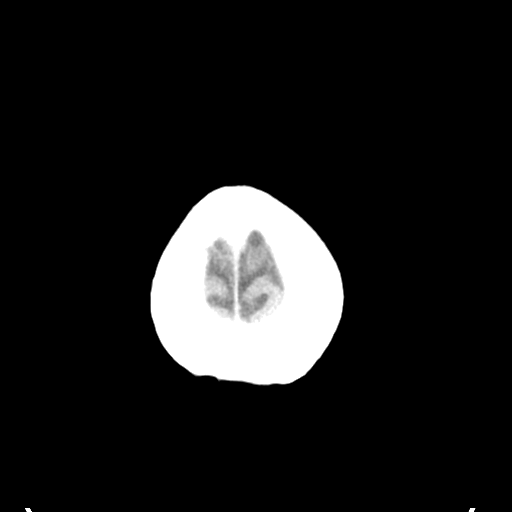

[Series 4: head bone · axial · 0.46mm/px · z∈[-125,-95]mm · 3 of 76 slices shown]
[im 8/76  bone]
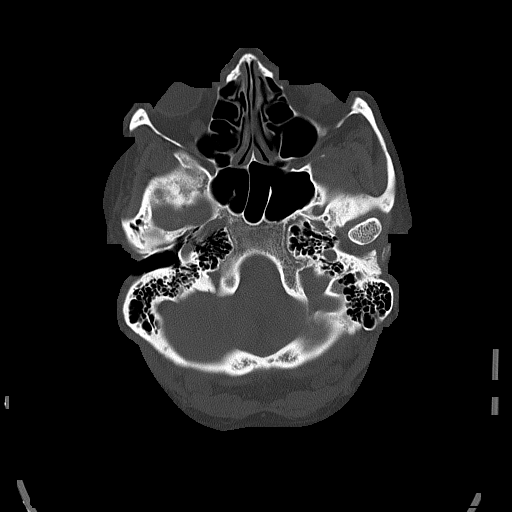
[im 16/76  bone]
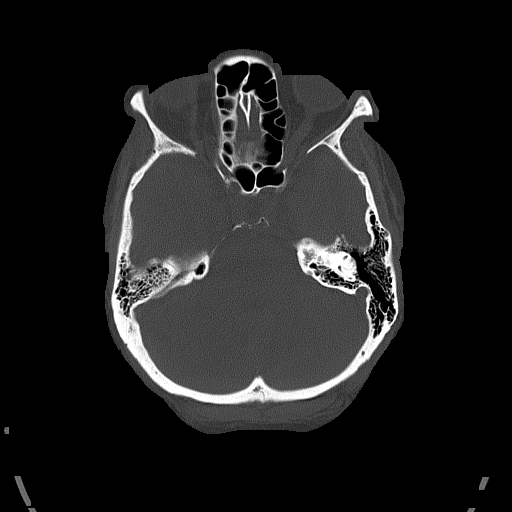
[im 23/76  bone]
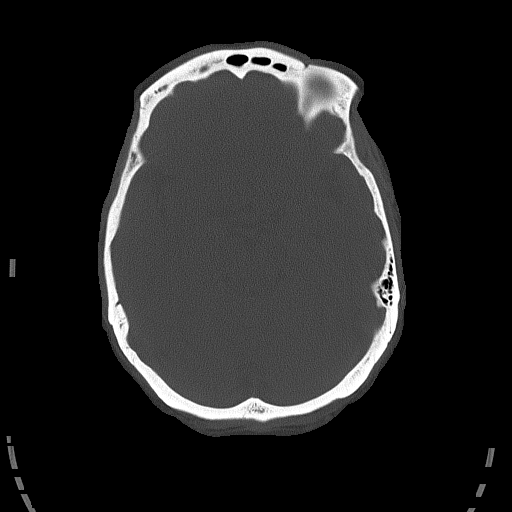

[Series 5: head without cor · coronal · non-contrast · 0.32mm/px · 3 of 69 slices shown]
[im 23/69  brain]
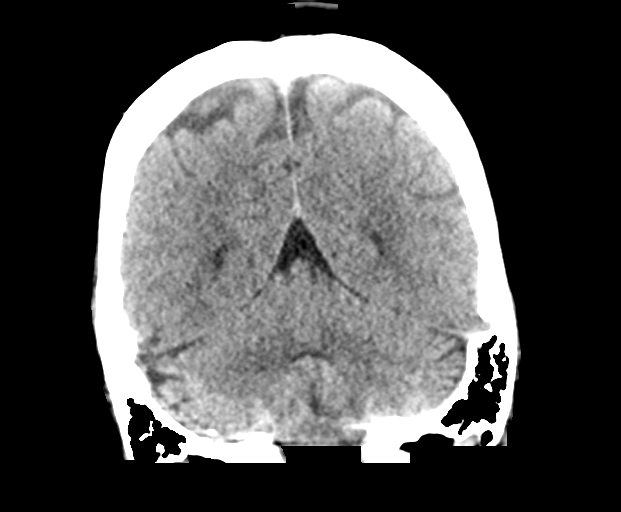
[im 31/69  brain]
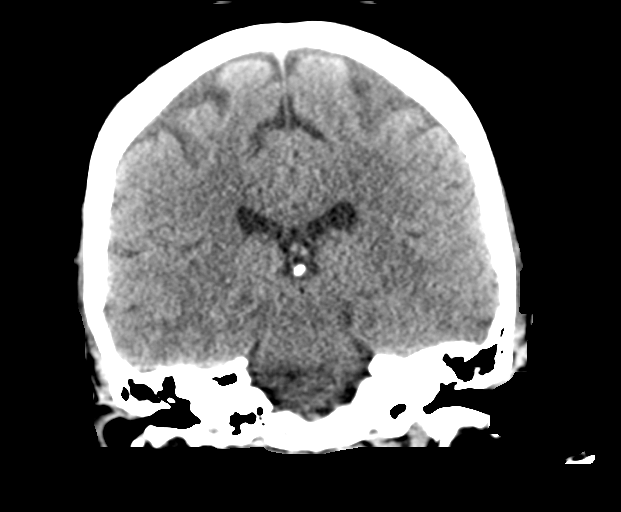
[im 38/69  brain]
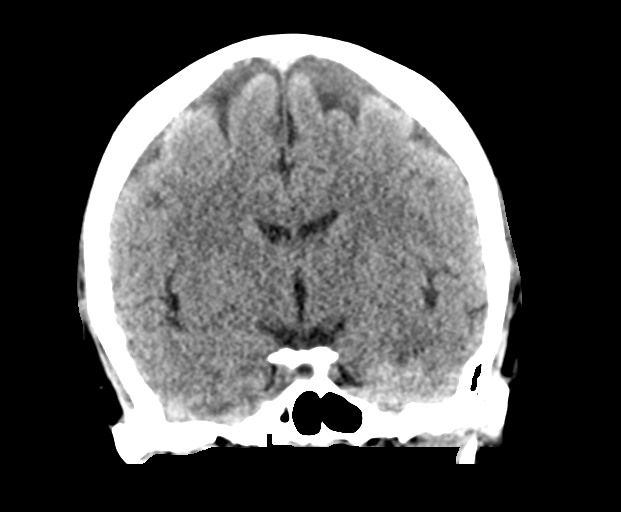

[Series 6: head without sag · sagittal · non-contrast · 0.34mm/px · 3 of 66 slices shown]
[im 22/66  brain]
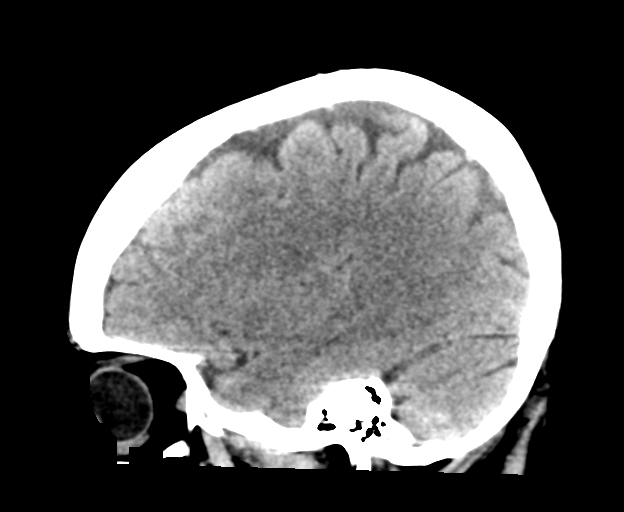
[im 33/66  brain]
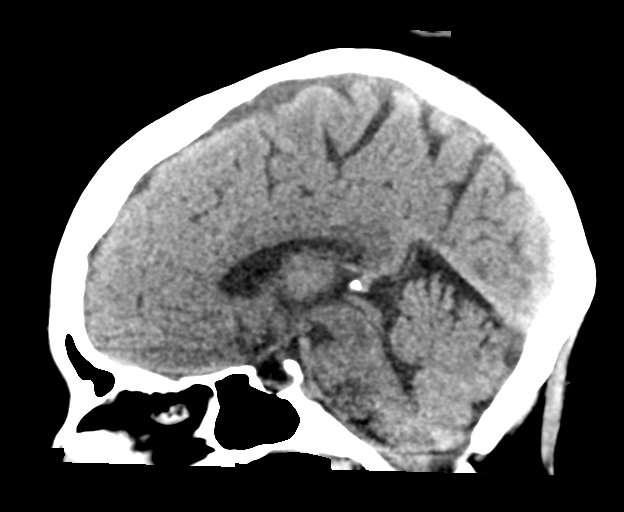
[im 44/66  brain]
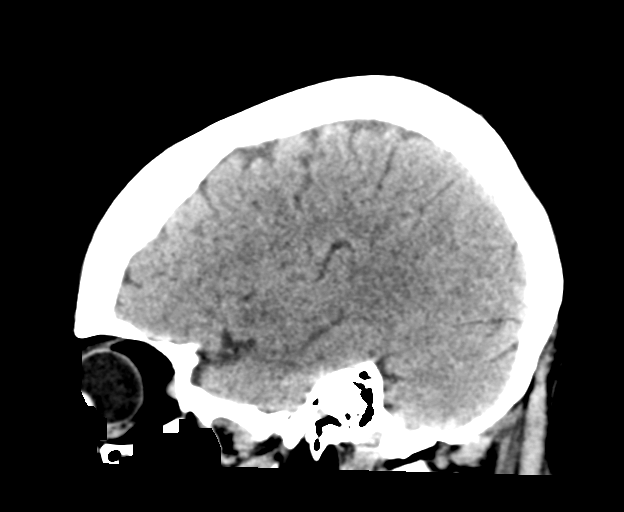

[16 of 47 positions shown; findings below may reference images not displayed]

FINDINGS: Brain: No evidence of acute infarction, hemorrhage, hydrocephalus,
extra-axial collection or mass lesion/mass effect. Partially empty
sella

Vascular: No hyperdense vessel identified. Calcific intracranial
atherosclerosis.

Skull: Normal. Negative for fracture or focal lesion.

Sinuses/Orbits: No acute finding.

Other: None.
IMPRESSION: 1. No acute intracranial abnormality.
2. Partially empty sella, which may be an incidental finding but can
be seen with idiopathic intracranial hypertension in the correct
clinical setting.

## 2021-11-24 ENCOUNTER — Other Ambulatory Visit: Payer: Self-pay | Admitting: Internal Medicine

## 2022-01-08 ENCOUNTER — Telehealth (INDEPENDENT_AMBULATORY_CARE_PROVIDER_SITE_OTHER): Payer: 59 | Admitting: Internal Medicine

## 2022-01-08 DIAGNOSIS — I10 Essential (primary) hypertension: Secondary | ICD-10-CM

## 2022-01-08 DIAGNOSIS — F4321 Adjustment disorder with depressed mood: Secondary | ICD-10-CM | POA: Diagnosis not present

## 2022-01-08 DIAGNOSIS — F32A Depression, unspecified: Secondary | ICD-10-CM

## 2022-01-08 DIAGNOSIS — F5101 Primary insomnia: Secondary | ICD-10-CM

## 2022-01-08 DIAGNOSIS — F411 Generalized anxiety disorder: Secondary | ICD-10-CM

## 2022-01-08 MED ORDER — FUROSEMIDE 80 MG PO TABS: 80.0000 mg | ORAL_TABLET | Freq: Every day | ORAL | 0 refills | Status: DC

## 2022-01-08 MED ORDER — TRAZODONE HCL 50 MG PO TABS: 50.0000 mg | ORAL_TABLET | Freq: Every day | ORAL | 1 refills | Status: DC

## 2022-01-08 MED ORDER — OLMESARTAN MEDOXOMIL 20 MG PO TABS: 20.0000 mg | ORAL_TABLET | Freq: Every day | ORAL | 1 refills | Status: DC

## 2022-01-08 MED ORDER — CLONAZEPAM 1 MG PO TABS: 1.0000 mg | ORAL_TABLET | Freq: Two times a day (BID) | ORAL | 1 refills | Status: DC | PRN

## 2022-01-12 ENCOUNTER — Telehealth: Payer: Self-pay | Admitting: Internal Medicine

## 2022-01-12 ENCOUNTER — Encounter: Payer: Self-pay | Admitting: Internal Medicine

## 2022-01-12 DIAGNOSIS — G4700 Insomnia, unspecified: Secondary | ICD-10-CM | POA: Insufficient documentation

## 2022-01-12 DIAGNOSIS — F4321 Adjustment disorder with depressed mood: Secondary | ICD-10-CM | POA: Insufficient documentation

## 2022-01-12 NOTE — Assessment & Plan Note (Signed)
Mod to severe per pt - for grief counseling referral, declines tiral SSRI

## 2022-01-12 NOTE — Assessment & Plan Note (Signed)
Overall chronic persistent, for klonopin refill, f/u PCP

## 2022-03-26 LAB — HM MAMMOGRAPHY

## 2022-05-27 ENCOUNTER — Other Ambulatory Visit: Payer: Self-pay | Admitting: Internal Medicine

## 2022-05-28 ENCOUNTER — Other Ambulatory Visit: Payer: Self-pay

## 2022-05-28 ENCOUNTER — Telehealth: Payer: Self-pay

## 2022-05-28 ENCOUNTER — Encounter (HOSPITAL_BASED_OUTPATIENT_CLINIC_OR_DEPARTMENT_OTHER): Payer: Self-pay | Admitting: Emergency Medicine

## 2022-05-28 ENCOUNTER — Ambulatory Visit: Payer: 59 | Admitting: Nurse Practitioner

## 2022-05-28 ENCOUNTER — Emergency Department (HOSPITAL_BASED_OUTPATIENT_CLINIC_OR_DEPARTMENT_OTHER): Payer: 59

## 2022-05-28 ENCOUNTER — Emergency Department (HOSPITAL_BASED_OUTPATIENT_CLINIC_OR_DEPARTMENT_OTHER)
Admission: EM | Admit: 2022-05-28 | Discharge: 2022-05-28 | Disposition: A | Discharge status: Home / Self Care | Payer: 59 | Attending: Emergency Medicine | Admitting: Emergency Medicine

## 2022-05-28 VITALS — BP 138/86 | HR 76 | Temp 98.0°F | Ht 67.0 in | Wt 315.1 lb

## 2022-05-28 DIAGNOSIS — M79605 Pain in left leg: Secondary | ICD-10-CM | POA: Insufficient documentation

## 2022-05-28 DIAGNOSIS — I10 Essential (primary) hypertension: Secondary | ICD-10-CM | POA: Insufficient documentation

## 2022-05-28 DIAGNOSIS — Z79899 Other long term (current) drug therapy: Secondary | ICD-10-CM | POA: Diagnosis not present

## 2022-05-28 LAB — CBC WITH DIFFERENTIAL/PLATELET
Abs Immature Granulocytes: 0.03 10*3/uL (ref 0.00–0.07)
Basophils Absolute: 0 10*3/uL (ref 0.0–0.1)
Basophils Relative: 1 %
Eosinophils Absolute: 0.1 10*3/uL (ref 0.0–0.5)
Eosinophils Relative: 2 %
HCT: 38.1 % (ref 36.0–46.0)
Hemoglobin: 11.7 g/dL — ABNORMAL LOW (ref 12.0–15.0)
Immature Granulocytes: 0 %
Lymphocytes Relative: 40 %
Lymphs Abs: 3.5 10*3/uL (ref 0.7–4.0)
MCH: 27.3 pg (ref 26.0–34.0)
MCHC: 30.7 g/dL (ref 30.0–36.0)
MCV: 88.8 fL (ref 80.0–100.0)
Monocytes Absolute: 0.4 10*3/uL (ref 0.1–1.0)
Monocytes Relative: 4 %
Neutro Abs: 4.6 10*3/uL (ref 1.7–7.7)
Neutrophils Relative %: 53 %
Platelets: 336 10*3/uL (ref 150–400)
RBC: 4.29 MIL/uL (ref 3.87–5.11)
RDW: 13.6 % (ref 11.5–15.5)
WBC: 8.6 10*3/uL (ref 4.0–10.5)
nRBC: 0 % (ref 0.0–0.2)

## 2022-05-28 LAB — BASIC METABOLIC PANEL
Anion gap: 11 (ref 5–15)
BUN: 12 mg/dL (ref 6–20)
CO2: 27 mmol/L (ref 22–32)
Calcium: 9.8 mg/dL (ref 8.9–10.3)
Chloride: 101 mmol/L (ref 98–111)
Creatinine, Ser: 0.73 mg/dL (ref 0.44–1.00)
GFR, Estimated: >60 mL/min (ref >60)
Glucose, Bld: 97 mg/dL (ref 70–99)
Potassium: 4.2 mmol/L (ref 3.5–5.1)
Sodium: 139 mmol/L (ref 135–145)

## 2022-05-28 LAB — COMPREHENSIVE METABOLIC PANEL
ALT: 13 U/L (ref 0–35)
AST: 15 U/L (ref 0–37)
Albumin: 4 g/dL (ref 3.5–5.2)
Alkaline Phosphatase: 73 U/L (ref 39–117)
BUN: 12 mg/dL (ref 6–23)
CO2: 31 mEq/L (ref 19–32)
Calcium: 9.6 mg/dL (ref 8.4–10.5)
Chloride: 103 mEq/L (ref 96–112)
Creatinine, Ser: 0.72 mg/dL (ref 0.40–1.20)
GFR: 94.97 mL/min (ref >60.00)
Glucose, Bld: 95 mg/dL (ref 70–99)
Potassium: 4.1 mEq/L (ref 3.5–5.1)
Sodium: 139 mEq/L (ref 135–145)
Total Bilirubin: 0.2 mg/dL (ref 0.2–1.2)
Total Protein: 7.8 g/dL (ref 6.0–8.3)

## 2022-05-28 LAB — CBC
HCT: 36.1 % (ref 36.0–46.0)
Hemoglobin: 11.4 g/dL — ABNORMAL LOW (ref 12.0–15.0)
MCHC: 31.6 g/dL (ref 30.0–36.0)
MCV: 85.6 fl (ref 78.0–100.0)
Platelets: 341 10*3/uL (ref 150.0–400.0)
RBC: 4.22 Mil/uL (ref 3.87–5.11)
RDW: 14.2 % (ref 11.5–15.5)
WBC: 9.7 10*3/uL (ref 4.0–10.5)

## 2022-05-28 LAB — D-DIMER, QUANTITATIVE: D-Dimer, Quant: 0.55 mcg/mL FEU — ABNORMAL HIGH (ref <0.50)

## 2022-05-28 LAB — VITAMIN B12: Vitamin B-12: 695 pg/mL (ref 211–911)

## 2022-05-28 MED ORDER — DEXAMETHASONE SODIUM PHOSPHATE 10 MG/ML IJ SOLN
10.0000 mg | Freq: Once | INTRAMUSCULAR | Status: AC
  Administered 2022-05-28: 10 mg via INTRAMUSCULAR
  Filled 2022-05-28: qty 1

## 2022-05-28 MED ORDER — METHOCARBAMOL 500 MG PO TABS: 500.0000 mg | ORAL_TABLET | Freq: Three times a day (TID) | ORAL | 1 refills | Status: DC | PRN

## 2022-05-28 NOTE — Assessment & Plan Note (Signed)
Acute, I think this may represent sciatica pain.  However, cannot rule out DVT this time.  We had shared decision discussion regarding proceeding to the emergency department for emergent imaging to rule out blood clot.  Patient does not have many risk factors for blood clot however I still feel the safest course of action would be to rule this out as possible cause.  No sign of infection noted on exam today.  We will send in prescription for muscle relaxer (Robaxin), recommend that she not drive or operate heavy machinery while taking the medication.  She reports her understanding and plans to proceed to the emergency part to rule out DVT.  Patient to follow-up with pcp in a few weeks for close monitoring.

## 2022-05-28 NOTE — Patient Instructions (Addendum)
[facility] [place]

## 2022-05-28 NOTE — Telephone Encounter (Signed)
Noted. Will look out for triage report.

## 2022-05-28 NOTE — ED Triage Notes (Addendum)
Left leg pain started 4am. Front/shin leg pain. Eased after 1 hour. Then pain returned around 10am. Drank Gatorade and mustard at home. Was seen by pcp. Who referred to ed to r/o dvt. Elevated d-dimer in mychart

## 2022-05-28 NOTE — Telephone Encounter (Signed)
Patient called in stating she has had a leg cramp since 4 am this morning, thought maybe it was a charley horse took some OTC medications and topical creams which seemed to help for a bit but the moment she sat down the cramp became worse. Jessica Davenport said the pain is at a 10, feels like a dull but sharp pain in her thigh. Had patient speak with triage.

## 2022-05-28 NOTE — Progress Notes (Signed)
Established Patient Office Visit  Subjective   Patient ID: SKYRAH KRUPP, female    DOB: 08/13/1965  Age: 56 y.o. MRN: [id number]  Chief Complaint  Patient presents with   Leg Pain    Symptom onset 4 AM this morning.  Has history of intermittent leg cramps.  Reports pain woke her up out of sleep.  Located to her left lower extremity.  She has tried eating mustard and drinking Gatorade as well as using over-the-counter potassium supplementation.  Reports that pain eventually stopped approximate 1 hour after it started, but then seem to occur again about 4 hours later when she rolled over while laying down.  States pain is sharp and aching.  Denies fevers or sensory changes.  No known recent trauma.  Does have family history of blood clot in mother.  Not currently on hormone replacement therapy, does not smoke, has not had recent surgery, has not traveled farther than [place] in car or airplane recently.  Does have history of lumbar disc disease as well as history of edema in lower extremities.  Takes Lasix.    Review of Systems  Constitutional:  Negative for fever.  Respiratory:  Negative for shortness of breath.   Cardiovascular:  Negative for chest pain and leg swelling.  Musculoskeletal:  Positive for back pain.      Objective:     BP 138/86   Pulse 76   Temp 98 F (36.7 C) (Oral)   Ht '5\' 7"'$  (1.702 m)   Wt (!) 315 lb 2 oz (142.9 kg)   LMP 11/10/2011   SpO2 97%   BMI 49.36 kg/m    Physical Exam Vitals reviewed.  Constitutional:      General: She is not in acute distress.    Appearance: Normal appearance.  HENT:     Head: Normocephalic and atraumatic.  Neck:     Vascular: No carotid bruit.  Cardiovascular:     Rate and Rhythm: Normal rate and regular rhythm.     Pulses: Normal pulses.     Heart sounds: Normal heart sounds.  Pulmonary:     Effort: Pulmonary effort is normal.     Breath sounds: Normal breath sounds.  Musculoskeletal:     Right lower leg:  No edema.     Left lower leg: No edema.  Skin:    General: Skin is warm and dry.     Findings: No erythema, signs of injury, lesion, rash or wound.  Neurological:     General: No focal deficit present.     Mental Status: She is alert and oriented to person, place, and time.  Psychiatric:        Mood and Affect: Mood normal.        Behavior: Behavior normal.        Judgment: Judgment normal.      No results found for any visits on 05/28/22.    The 10-year ASCVD risk score (Arnett DK, et al., 2019) is: 2.9%    Assessment & Plan:   Problem List Items Addressed This Visit       Other   Pain of left lower extremity - Primary    Acute, I think this may represent sciatica pain.  However, cannot rule out DVT this time.  We had shared decision discussion regarding proceeding to the emergency department for emergent imaging to rule out blood clot.  Patient does not have many risk factors for blood clot however I still feel the safest course of  action would be to rule this out as possible cause.  No sign of infection noted on exam today.  We will send in prescription for muscle relaxer (Robaxin), recommend that she not drive or operate heavy machinery while taking the medication.  She reports her understanding and plans to proceed to the emergency part to rule out DVT.  Patient to follow-up with pcp in a few weeks for close monitoring.       Relevant Medications   methocarbamol (ROBAXIN) 500 MG tablet   Other Relevant Orders   Comprehensive metabolic panel   Vitamin M57   CBC   D-Dimer, Quantitative    Return in about 6 weeks (around 07/09/2022) for With Dr. Ronnald Ramp.    Ailene Ards, NP

## 2022-05-28 NOTE — ED Provider Notes (Signed)
[place] EMERGENCY DEPT Provider Note   CSN: [id number] Arrival date & time: 05/28/22  1517     History  Chief Complaint  Patient presents with   Leg Pain    Jessica Davenport is a 56 y.o. female.  Patient presents to the emergency department complaining of left-sided leg pain which began at approximately 4 AM.  Patient states the pain started in the lower leg and radiate up the left leg up to the level of the thigh.  She states pain eased after approximate 1 hour and return at 10 AM.  She states she drank some Gatorade and had mustard at home for muscle cramps.  She was seen by her primary care provider who ordered a d-dimer and instructed her to come to the emergency department for DVT evaluation.  Her D-dimer was noted to be elevated.  Past medical history significant for lumbar disc disease, obesity, hypertension, lower extremity edema, pulmonary hypertension  HPI     Home Medications Prior to Admission medications   Medication Sig Start Date End Date Taking? Authorizing Provider  albuterol (VENTOLIN HFA) 108 (90 Base) MCG/ACT inhaler Inhale 1 puff into the lungs every 6 (six) hours as needed for wheezing or shortness of breath. 07/17/21   Hoyt Koch, MD  clonazePAM (KLONOPIN) 1 MG tablet Take 1 tablet (1 mg total) by mouth 2 (two) times daily as needed for anxiety. 01/08/22   Biagio Borg, MD  EPINEPHrine 0.3 mg/0.3 mL IJ SOAJ injection Inject 0.3 mg into the muscle as needed for anaphylaxis.  09/17/19   [provider]  furosemide (LASIX) 80 MG tablet TAKE ONE TABLET BY MOUTH DAILY 05/28/22   Fay Records, MD  methocarbamol (ROBAXIN) 500 MG tablet Take 1 tablet (500 mg total) by mouth every 8 (eight) hours as needed for muscle spasms. 05/28/22   Ailene Ards, NP  olmesartan (BENICAR) 20 MG tablet Take 1 tablet (20 mg total) by mouth daily. 01/08/22   Biagio Borg, MD  omeprazole (PRILOSEC) 40 MG capsule TAKE ONE CAPSULE BY MOUTH TWICE A DAY  09/14/21   Thornton Park, MD  potassium chloride SA (KLOR-CON M) 20 MEQ tablet Take 2 tablets by mouth every AM and one tablet every PM 07/02/21   Biagio Borg, MD  traZODone (DESYREL) 50 MG tablet Take 1 tablet (50 mg total) by mouth at bedtime. 01/08/22   Biagio Borg, MD      Allergies    Grass pollen(k-o-r-t-swt vern), Lisinopril, and Other    Review of Systems   Review of Systems  Cardiovascular:  Positive for leg swelling.  Musculoskeletal:  Positive for myalgias.    Physical Exam Updated Vital Signs BP (!) 167/61 (BP Location: Right Wrist)   Pulse 72   Temp 98.4 F (36.9 C) (Oral)   Resp 15   Ht '5\' 7"'$  (1.702 m)   Wt (!) 140.6 kg   LMP 11/10/2011   SpO2 100%   BMI 48.55 kg/m  Physical Exam Vitals and nursing note reviewed.  Constitutional:      General: She is not in acute distress.    Appearance: She is well-developed. She is obese.  HENT:     Head: Normocephalic and atraumatic.  Eyes:     Conjunctiva/sclera: Conjunctivae normal.  Cardiovascular:     Rate and Rhythm: Normal rate and regular rhythm.     Heart sounds: No murmur heard. Pulmonary:     Effort: Pulmonary effort is normal. No respiratory distress.  Breath sounds: Normal breath sounds.  Abdominal:     Palpations: Abdomen is soft.     Tenderness: There is no abdominal tenderness.  Musculoskeletal:        General: Swelling present. No tenderness.     Cervical back: Neck supple.     Comments: Negative SLR bilaterally  Skin:    General: Skin is warm and dry.     Capillary Refill: Capillary refill takes less than 2 seconds.  Neurological:     Mental Status: She is alert.  Psychiatric:        Mood and Affect: Mood normal.     ED Results / Procedures / Treatments   Labs (all labs ordered are listed, but only abnormal results are displayed) Labs Reviewed  CBC WITH DIFFERENTIAL/PLATELET - Abnormal; Notable for the following components:      Result Value   Hemoglobin 11.7 (*)    All other  components within normal limits  BASIC METABOLIC PANEL    EKG None  Radiology US Venous Img Lower  Left (DVT Study)  Result Date: 05/28/2022 CLINICAL DATA:  POSITIVE D-dimer.  LEFT thigh cramping. EXAM: LEFT LOWER EXTREMITY VENOUS DOPPLER ULTRASOUND TECHNIQUE: Gray-scale sonography with compression, as well as color and duplex ultrasound, were performed to evaluate the deep venous system(s) from the level of the common femoral vein through the popliteal and proximal calf veins. COMPARISON:  LEFT lower extremity XRs, 08/19/2020. FINDINGS: VENOUS Normal compressibility of the LEFT common femoral, superficial femoral, and popliteal veins, as well as the visualized calf veins. Visualized portions of profunda femoral vein and great saphenous vein unremarkable. No filling defects to suggest DVT on grayscale or color Doppler imaging. Doppler waveforms show normal direction of venous flow, normal respiratory plasticity and response to augmentation. Limited views of the contralateral common femoral vein are unremarkable. OTHER No evidence of superficial thrombophlebitis or abnormal fluid collection. Limitations: none IMPRESSION: No evidence of femoropopliteal DVT or superficial thrombophlebitis within the LEFT lower extremity. Michaelle Birks, MD [facility] Radiology Electronically Signed   By: Michaelle Birks M.D.   On: 05/28/2022 17:01    Procedures Procedures    Medications Ordered in ED Medications  dexamethasone (DECADRON) injection 10 mg (has no administration in time range)    ED Course/ Medical Decision Making/ A&P                           Medical Decision Making Amount and/or Complexity of Data Reviewed Labs: ordered.   This patient presents to the ED for concern of left leg pain and swelling, this involves an extensive number of treatment options, and is a complaint that carries with it a high risk of complications and morbidity.  The  differential diagnosis includes DVT, dependent edema, cellulitis, and others   Co morbidities that complicate the patient evaluation  Obesity, hypertension   Additional history obtained:   External records from outside source obtained and reviewed including urgent care notes from earlier today for the same complaint.  Provider at that time considered sciatic type pain but was also concerned about possible blood clot   Lab Tests:  I Ordered, and personally interpreted labs.  The pertinent results include:  Unremarkable CBC, BMP.  D-dimer performed earlier today mildly elevated at 0.55   Imaging Studies ordered:  I ordered imaging studies including venous ultrasound of the left leg for DVT rule out I independently visualized and interpreted imaging which showed no acute  DVT I agree with the radiologist interpretation   Test / Admission - Considered:  The patient does not have a DVT.  She does have a history of muscle cramps today description of her pain makes me think she may have some radiculopathy due to her lumbar disc disease.  I ordered the patient Decadron for inflammation.  Upon reassessment she is doing about the same.  Plan to discharge patient home.  No DVT.  No acute etiology noted for the patient's pain.  No signs of cellulitis.  No shortness of breath.  No red flag symptoms to require lumbar imaging such as saddle anesthesia, urinary or fecal incontinence, urinary retention.  No new injury.  Patient may follow-up with her primary care provider for further evaluation and management as needed        Final Clinical Impression(s) / ED Diagnoses Final diagnoses:  Left leg pain    Rx / DC Orders ED Discharge Orders     None         Ronny Bacon 05/28/22 2042    Wynona Dove A, DO 06/03/22 1420

## 2022-05-28 NOTE — Discharge Instructions (Signed)
You were seen today for left-sided leg pain.  Your work-up revealed no signs of a deep vein thrombosis.  This may be due to sciatic type pain.  You were administered a steroid at today's visit.  I recommend taking over-the-counter medication as needed such as Advil or Tylenol and follow-up with your primary care provider.

## 2022-06-09 ENCOUNTER — Other Ambulatory Visit: Payer: Self-pay | Admitting: Internal Medicine

## 2022-06-15 DIAGNOSIS — M545 Low back pain, unspecified: Secondary | ICD-10-CM | POA: Insufficient documentation

## 2022-07-08 ENCOUNTER — Ambulatory Visit: Payer: 59 | Admitting: Internal Medicine

## 2022-07-08 ENCOUNTER — Encounter: Payer: Self-pay | Admitting: Internal Medicine

## 2022-07-08 VITALS — BP 162/110 | HR 81 | Temp 98.7°F | Resp 16 | Ht 67.0 in | Wt 320.0 lb

## 2022-07-08 DIAGNOSIS — Z6841 Body Mass Index (BMI) 40.0 and over, adult: Secondary | ICD-10-CM | POA: Diagnosis not present

## 2022-07-08 DIAGNOSIS — D509 Iron deficiency anemia, unspecified: Secondary | ICD-10-CM

## 2022-07-08 DIAGNOSIS — R7303 Prediabetes: Secondary | ICD-10-CM

## 2022-07-08 DIAGNOSIS — Z0000 Encounter for general adult medical examination without abnormal findings: Secondary | ICD-10-CM

## 2022-07-08 DIAGNOSIS — Z0001 Encounter for general adult medical examination with abnormal findings: Secondary | ICD-10-CM

## 2022-07-08 DIAGNOSIS — I10 Essential (primary) hypertension: Secondary | ICD-10-CM | POA: Diagnosis not present

## 2022-07-08 DIAGNOSIS — Z1211 Encounter for screening for malignant neoplasm of colon: Secondary | ICD-10-CM

## 2022-07-08 DIAGNOSIS — Z23 Encounter for immunization: Secondary | ICD-10-CM

## 2022-07-08 LAB — URINALYSIS, ROUTINE W REFLEX MICROSCOPIC
Bilirubin Urine: NEGATIVE
Ketones, ur: NEGATIVE
Leukocytes,Ua: NEGATIVE
Nitrite: NEGATIVE
Specific Gravity, Urine: 1.025 (ref 1.000–1.030)
Total Protein, Urine: NEGATIVE
Urine Glucose: NEGATIVE
Urobilinogen, UA: 0.2 (ref 0.0–1.0)
pH: 6 (ref 5.0–8.0)

## 2022-07-08 LAB — BASIC METABOLIC PANEL
BUN: 12 mg/dL (ref 6–23)
CO2: 30 mEq/L (ref 19–32)
Calcium: 9.7 mg/dL (ref 8.4–10.5)
Chloride: 103 mEq/L (ref 96–112)
Creatinine, Ser: 0.63 mg/dL (ref 0.40–1.20)
GFR: 100.68 mL/min (ref >60.00)
Glucose, Bld: 95 mg/dL (ref 70–99)
Potassium: 4 mEq/L (ref 3.5–5.1)
Sodium: 139 mEq/L (ref 135–145)

## 2022-07-08 LAB — TSH: TSH: 1.66 u[IU]/mL (ref 0.35–5.50)

## 2022-07-08 LAB — IBC + FERRITIN
Ferritin: 10.8 ng/mL (ref 10.0–291.0)
Iron: 49 ug/dL (ref 42–145)
Saturation Ratios: 12.7 % — ABNORMAL LOW (ref 20.0–50.0)
TIBC: 386.4 ug/dL (ref 250.0–450.0)
Transferrin: 276 mg/dL (ref 212.0–360.0)

## 2022-07-08 LAB — HEMOGLOBIN A1C: Hgb A1c MFr Bld: 6.4 % (ref 4.6–6.5)

## 2022-07-08 MED ORDER — TRIAMTERENE-HCTZ 37.5-25 MG PO CAPS: 1.0000 | ORAL_CAPSULE | Freq: Every day | ORAL | 0 refills | Status: DC

## 2022-07-08 NOTE — Patient Instructions (Signed)
Hypertension, Adult High blood pressure (hypertension) is when the force of blood pumping through the arteries is too strong. The arteries are the blood vessels that carry blood from the heart throughout the body. Hypertension forces the heart to work harder to pump blood and may cause arteries to become narrow or stiff. Untreated or uncontrolled hypertension can lead to a heart attack, heart failure, a stroke, kidney disease, and other problems. A blood pressure reading consists of a higher number over a lower number. Ideally, your blood pressure should be below 120/80. The first ("top") number is called the systolic pressure. It is a measure of the pressure in your arteries as your heart beats. The second ("bottom") number is called the diastolic pressure. It is a measure of the pressure in your arteries as the heart relaxes. What are the causes? The exact cause of this condition is not known. There are some conditions that result in high blood pressure. What increases the risk? Certain factors may make you more likely to develop high blood pressure. Some of these risk factors are under your control, including: Smoking. Not getting enough exercise or physical activity. Being overweight. Having too much fat, sugar, calories, or salt (sodium) in your diet. Drinking too much alcohol. Other risk factors include: Having a personal history of heart disease, diabetes, high cholesterol, or kidney disease. Stress. Having a family history of high blood pressure and high cholesterol. Having obstructive sleep apnea. Age. The risk increases with age. What are the signs or symptoms? High blood pressure may not cause symptoms. Very high blood pressure (hypertensive crisis) may cause: Headache. Fast or irregular heartbeats (palpitations). Shortness of breath. Nosebleed. Nausea and vomiting. Vision changes. Severe chest pain, dizziness, and seizures. How is this diagnosed? This condition is diagnosed by  measuring your blood pressure while you are seated, with your arm resting on a flat surface, your legs uncrossed, and your feet flat on the floor. The cuff of the blood pressure monitor will be placed directly against the skin of your upper arm at the level of your heart. Blood pressure should be measured at least twice using the same arm. Certain conditions can cause a difference in blood pressure between your right and left arms. If you have a high blood pressure reading during one visit or you have normal blood pressure with other risk factors, you may be asked to: Return on a different day to have your blood pressure checked again. Monitor your blood pressure at home for 1 week or longer. If you are diagnosed with hypertension, you may have other blood or imaging tests to help your health care provider understand your overall risk for other conditions. How is this treated? This condition is treated by making healthy lifestyle changes, such as eating healthy foods, exercising more, and reducing your alcohol intake. You may be referred for counseling on a healthy diet and physical activity. Your health care provider may prescribe medicine if lifestyle changes are not enough to get your blood pressure under control and if: Your systolic blood pressure is above 130. Your diastolic blood pressure is above 80. Your personal target blood pressure may vary depending on your medical conditions, your age, and other factors. Follow these instructions at home: Eating and drinking  Eat a diet that is high in fiber and potassium, and low in sodium, added sugar, and fat. An example of this eating plan is called the DASH diet. DASH stands for Dietary Approaches to Stop Hypertension. To eat this way: Eat   plenty of fresh fruits and vegetables. Try to fill one half of your plate at each meal with fruits and vegetables. Eat whole grains, such as whole-wheat pasta, brown rice, or whole-grain bread. Fill about one  fourth of your plate with whole grains. Eat or drink low-fat dairy products, such as skim milk or low-fat yogurt. Avoid fatty cuts of meat, processed or cured meats, and poultry with skin. Fill about one fourth of your plate with lean proteins, such as fish, chicken without skin, beans, eggs, or tofu. Avoid pre-made and processed foods. These tend to be higher in sodium, added sugar, and fat. Reduce your daily sodium intake. Many people with hypertension should eat less than 1,500 mg of sodium a day. Do not drink alcohol if: Your health care provider tells you not to drink. You are pregnant, may be pregnant, or are planning to become pregnant. If you drink alcohol: Limit how much you have to: 0-1 drink a day for women. 0-2 drinks a day for men. Know how much alcohol is in your drink. In the [place] one drink equals one 12 oz bottle of beer (355 mL), one 5 oz glass of wine (148 mL), or one 1 oz glass of hard liquor (44 mL). Lifestyle  Work with your health care provider to maintain a healthy body weight or to lose weight. Ask what an ideal weight is for you. Get at least 30 minutes of exercise that causes your heart to beat faster (aerobic exercise) most days of the week. Activities may include walking, swimming, or biking. Include exercise to strengthen your muscles (resistance exercise), such as Pilates or lifting weights, as part of your weekly exercise routine. Try to do these types of exercises for 30 minutes at least 3 days a week. Do not use any products that contain nicotine or tobacco. These products include cigarettes, chewing tobacco, and vaping devices, such as e-cigarettes. If you need help quitting, ask your health care provider. Monitor your blood pressure at home as told by your health care provider. Keep all follow-up visits. This is important. Medicines Take over-the-counter and prescription medicines only as told by your health care provider. Follow directions carefully. Blood  pressure medicines must be taken as prescribed. Do not skip doses of blood pressure medicine. Doing this puts you at risk for problems and can make the medicine less effective. Ask your health care provider about side effects or reactions to medicines that you should watch for. Contact a health care provider if you: Think you are having a reaction to a medicine you are taking. Have headaches that keep coming back (recurring). Feel dizzy. Have swelling in your ankles. Have trouble with your vision. Get help right away if you: Develop a severe headache or confusion. Have unusual weakness or numbness. Feel faint. Have severe pain in your chest or abdomen. Vomit repeatedly. Have trouble breathing. These symptoms may be an emergency. Get help right away. Call 911. Do not wait to see if the symptoms will go away. Do not drive yourself to the hospital. Summary Hypertension is when the force of blood pumping through your arteries is too strong. If this condition is not controlled, it may put you at risk for serious complications. Your personal target blood pressure may vary depending on your medical conditions, your age, and other factors. For most people, a normal blood pressure is less than 120/80. Hypertension is treated with lifestyle changes, medicines, or a combination of both. Lifestyle changes include losing weight, eating a healthy,   low-sodium diet, exercising more, and limiting alcohol. This information is not intended to replace advice given to you by your health care provider. Make sure you discuss any questions you have with your health care provider. Document Revised: 05/12/2021 Document Reviewed: 05/12/2021 [employer] Patient Education  2023 [employer]  

## 2022-07-08 NOTE — Progress Notes (Unsigned)
Subjective:  Patient ID: Jessica Davenport, female    DOB: 07/24/1965  Age: 56 y.o. MRN: [id number]  CC: Annual Exam, Anemia, and Hypertension   HPI Karlye LARRI BREWTON presents for a CPX and f/up -   She is not taking the loop diuretic.  She complains of weight gain, chronic fatigue, and low back pain but denies chest pain, shortness of breath, diaphoresis, or edema.  Outpatient Medications Prior to Visit  Medication Sig Dispense Refill   clonazePAM (KLONOPIN) 1 MG tablet Take 1 tablet (1 mg total) by mouth 2 (two) times daily as needed for anxiety. 60 tablet 1   EPINEPHrine 0.3 mg/0.3 mL IJ SOAJ injection Inject 0.3 mg into the muscle as needed for anaphylaxis.      methocarbamol (ROBAXIN) 500 MG tablet Take 1 tablet (500 mg total) by mouth every 8 (eight) hours as needed for muscle spasms. 10 tablet 1   olmesartan (BENICAR) 20 MG tablet Take 1 tablet (20 mg total) by mouth daily. 90 tablet 1   omeprazole (PRILOSEC) 40 MG capsule TAKE ONE CAPSULE BY MOUTH TWICE A DAY 60 capsule 5   potassium chloride SA (KLOR-CON M) 20 MEQ tablet Take 2 tablets by mouth every AM and one tablet every PM 270 tablet 3   traZODone (DESYREL) 50 MG tablet Take 1 tablet (50 mg total) by mouth at bedtime. 90 tablet 1   albuterol (VENTOLIN HFA) 108 (90 Base) MCG/ACT inhaler Inhale 1 puff into the lungs every 6 (six) hours as needed for wheezing or shortness of breath. 8 g 0   furosemide (LASIX) 80 MG tablet TAKE ONE TABLET BY MOUTH DAILY 15 tablet 0   No facility-administered medications prior to visit.    ROS Review of Systems  Constitutional:  Positive for fatigue and unexpected weight change (wt gain). Negative for appetite change and diaphoresis.  HENT: Negative.    Eyes: Negative.  Negative for visual disturbance.  Respiratory:  Negative for cough, chest tightness, shortness of breath and wheezing.   Cardiovascular:  Negative for chest pain, palpitations and leg swelling.  Gastrointestinal:  Negative  for abdominal pain, diarrhea and nausea.  Endocrine: Negative.   Genitourinary: Negative.  Negative for difficulty urinating, dysuria and hematuria.  Musculoskeletal:  Positive for back pain. Negative for arthralgias and myalgias.  Skin: Negative.   Neurological: Negative.  Negative for dizziness, weakness, light-headedness and headaches.  Hematological:  Negative for adenopathy. Does not bruise/bleed easily.  Psychiatric/Behavioral:  Negative for decreased concentration, dysphoric mood and sleep disturbance. The patient is nervous/anxious.     Objective:  BP (!) 162/110 (BP Location: Left Arm)   Pulse 81   Temp 98.7 F (37.1 C) (Oral)   Resp 16   Ht '5\' 7"'$  (1.702 m)   Wt (!) 320 lb (145.2 kg)   LMP 11/10/2011   SpO2 96%   BMI 50.12 kg/m   BP Readings from Last 3 Encounters:  07/08/22 (!) 162/110  05/28/22 (!) 164/85  05/28/22 138/86    Wt Readings from Last 3 Encounters:  07/08/22 (!) 320 lb (145.2 kg)  05/28/22 (!) 310 lb (140.6 kg)  05/28/22 (!) 315 lb 2 oz (142.9 kg)    Physical Exam Constitutional:      General: She is not in acute distress.    Appearance: She is obese. She is not toxic-appearing or diaphoretic.  HENT:     Mouth/Throat:     Mouth: Mucous membranes are moist.  Eyes:     General: No scleral  icterus.    Conjunctiva/sclera: Conjunctivae normal.  Neck:     Thyroid: No thyroid mass, thyromegaly or thyroid tenderness.     Comments: Right lateral neck there is a 2 CN subcutaneous mass that is nontender, nonfixed, and nonfluctuant. Cardiovascular:     Rate and Rhythm: Normal rate and regular rhythm.     Heart sounds: No murmur heard.    No friction rub. No gallop.     Comments: EKG- SR with 1st degree AV block, 70 bpm T wave changes in II, III, aVF, and precordial leads No LVH or Q waves Pulmonary:     Effort: Pulmonary effort is normal.     Breath sounds: No stridor. No wheezing, rhonchi or rales.  Abdominal:     General: Abdomen is  protuberant. Bowel sounds are normal. There is no distension.     Palpations: Abdomen is soft. There is no hepatomegaly, splenomegaly or mass.     Tenderness: There is no abdominal tenderness. There is no guarding.  Musculoskeletal:        General: Normal range of motion.     Cervical back: Neck supple.     Right lower leg: No edema.     Left lower leg: No edema.  Lymphadenopathy:     Cervical: No cervical adenopathy.     Right cervical: No superficial, deep or posterior cervical adenopathy.    Left cervical: No superficial, deep or posterior cervical adenopathy.  Skin:    General: Skin is warm and dry.  Neurological:     General: No focal deficit present.     Mental Status: She is alert.  Psychiatric:        Mood and Affect: Mood normal.        Behavior: Behavior normal.     Lab Results  Component Value Date   WBC 8.6 05/28/2022   HGB 11.7 (L) 05/28/2022   HCT 38.1 05/28/2022   PLT 336 05/28/2022   GLUCOSE 95 07/08/2022   CHOL 229 (H) 07/02/2021   TRIG 90.0 07/02/2021   HDL 61.30 07/02/2021   LDLDIRECT 148.1 06/16/2012   LDLCALC 150 (H) 07/02/2021   ALT 13 05/28/2022   AST 15 05/28/2022   NA 139 07/08/2022   K 4.0 07/08/2022   CL 103 07/08/2022   CREATININE 0.63 07/08/2022   BUN 12 07/08/2022   CO2 30 07/08/2022   TSH 1.66 07/08/2022   HGBA1C 6.4 07/08/2022    US Venous Img Lower  Left (DVT Study)  Result Date: 05/28/2022 CLINICAL DATA:  POSITIVE D-dimer.  LEFT thigh cramping. EXAM: LEFT LOWER EXTREMITY VENOUS DOPPLER ULTRASOUND TECHNIQUE: Gray-scale sonography with compression, as well as color and duplex ultrasound, were performed to evaluate the deep venous system(s) from the level of the common femoral vein through the popliteal and proximal calf veins. COMPARISON:  LEFT lower extremity XRs, 08/19/2020. FINDINGS: VENOUS Normal compressibility of the LEFT common femoral, superficial femoral, and popliteal veins, as well as the visualized calf veins. Visualized  portions of profunda femoral vein and great saphenous vein unremarkable. No filling defects to suggest DVT on grayscale or color Doppler imaging. Doppler waveforms show normal direction of venous flow, normal respiratory plasticity and response to augmentation. Limited views of the contralateral common femoral vein are unremarkable. OTHER No evidence of superficial thrombophlebitis or abnormal fluid collection. Limitations: none IMPRESSION: No evidence of femoropopliteal DVT or superficial thrombophlebitis within the LEFT lower extremity. Michaelle Birks, MD [facility] Radiology Electronically Signed   By: Wille Glaser  Mugweru M.D.   On: 05/28/2022 17:01    Assessment & Plan:   Cesiah was seen today for annual exam, anemia and hypertension.  Diagnoses and all orders for this visit:  Essential hypertension -     Cancel: CBC with Differential/Platelet; Future -     TSH; Future -     EKG 12-Lead -     TSH  Prediabetes- Her A1c is up to 6.4%. -     Hemoglobin A1c; Future -     Hemoglobin A1c  Iron deficiency anemia, unspecified iron deficiency anemia type -     IBC + Ferritin; Future -     Cancel: CBC with Differential/Platelet; Future -     IBC + Ferritin -     Ferric Maltol (ACCRUFER) 30 MG CAPS; Take 1 capsule by mouth in the morning and at bedtime.  Morbid obesity with BMI of 45.0-49.9, adult [facility])- Labs are negative for secondary causes. -     TSH; Future -     TSH  Accelerated hypertension- Her blood pressure is not well-controlled.  I recommended that she start the combination of triamterene and hydrochlorothiazide.  Will start a workup for secondary causes and endorgan damage. -     Basic metabolic panel; Future -     Urinalysis, Routine w reflex microscopic; Future -     Aldosterone + renin activity w/ ratio; Future -     triamterene-hydrochlorothiazide (DYAZIDE) 37.5-25 MG capsule; Take 1 each (1 capsule total) by mouth daily. -      Aldosterone + renin activity w/ ratio -     Urinalysis, Routine w reflex microscopic -     Basic metabolic panel -     VAS US RENAL ARTERY DUPLEX; Future  Encounter for general adult medical examination with abnormal findings- Exam completed, labs reviewed, vaccines reviewed and updated, cancer screenings addressed, patient education was given.  Screen for colon cancer -     Cologuard  Other orders -     Flu Vaccine QUAD 6+ mos PF IM (Fluarix Quad PF)   I have discontinued Ashaya Y. Christenson's albuterol and furosemide. I am also having her start on triamterene-hydrochlorothiazide and ACCRUFeR. Additionally, I am having her maintain her EPINEPHrine, potassium chloride SA, omeprazole, clonazePAM, traZODone, olmesartan, and methocarbamol.  Meds ordered this encounter  Medications   triamterene-hydrochlorothiazide (DYAZIDE) 37.5-25 MG capsule    Sig: Take 1 each (1 capsule total) by mouth daily.    Dispense:  90 capsule    Refill:  0   Ferric Maltol (ACCRUFER) 30 MG CAPS    Sig: Take 1 capsule by mouth in the morning and at bedtime.    Dispense:  90 capsule    Refill:  1     Follow-up: Return in about 6 weeks (around 08/19/2022).  [name], MD

## 2022-07-09 ENCOUNTER — Encounter: Payer: Self-pay | Admitting: Internal Medicine

## 2022-07-10 ENCOUNTER — Encounter: Payer: Self-pay | Admitting: Internal Medicine

## 2022-07-10 MED ORDER — ACCRUFER 30 MG PO CAPS: 1.0000 | ORAL_CAPSULE | Freq: Two times a day (BID) | ORAL | 1 refills | Status: DC

## 2022-07-10 NOTE — Addendum Note (Signed)
Addended by: Janith Lima on: 07/10/2022 10:02 AM   Modules accepted: Level of Service

## 2022-07-14 ENCOUNTER — Ambulatory Visit: Payer: 59 | Admitting: Behavioral Health

## 2022-07-15 ENCOUNTER — Encounter: Payer: Self-pay | Admitting: Internal Medicine

## 2022-07-15 ENCOUNTER — Ambulatory Visit: Payer: 59 | Admitting: Internal Medicine

## 2022-07-15 ENCOUNTER — Telehealth: Payer: Self-pay

## 2022-07-15 VITALS — BP 124/78 | HR 81 | Temp 99.3°F | Ht 67.0 in | Wt 314.0 lb

## 2022-07-15 DIAGNOSIS — R519 Headache, unspecified: Secondary | ICD-10-CM | POA: Diagnosis not present

## 2022-07-15 DIAGNOSIS — G8929 Other chronic pain: Secondary | ICD-10-CM | POA: Diagnosis not present

## 2022-07-15 DIAGNOSIS — J069 Acute upper respiratory infection, unspecified: Secondary | ICD-10-CM | POA: Diagnosis not present

## 2022-07-15 DIAGNOSIS — R1030 Lower abdominal pain, unspecified: Secondary | ICD-10-CM

## 2022-07-15 DIAGNOSIS — R1031 Right lower quadrant pain: Secondary | ICD-10-CM

## 2022-07-15 DIAGNOSIS — K5790 Diverticulosis of intestine, part unspecified, without perforation or abscess without bleeding: Secondary | ICD-10-CM | POA: Insufficient documentation

## 2022-07-15 LAB — ALDOSTERONE + RENIN ACTIVITY W/ RATIO
ALDO / PRA Ratio: 20 Ratio (ref 0.9–28.9)
Aldosterone: 1 ng/dL
Renin Activity: 0.05 ng/mL/h — ABNORMAL LOW (ref 0.25–5.82)

## 2022-07-15 LAB — CBC WITH DIFFERENTIAL/PLATELET
Basophils Absolute: 0.1 10*3/uL (ref 0.0–0.1)
Basophils Relative: 0.7 % (ref 0.0–3.0)
Eosinophils Absolute: 0.2 10*3/uL (ref 0.0–0.7)
Eosinophils Relative: 1.5 % (ref 0.0–5.0)
HCT: 39.1 % (ref 36.0–46.0)
Hemoglobin: 12.5 g/dL (ref 12.0–15.0)
Lymphocytes Relative: 32.6 % (ref 12.0–46.0)
Lymphs Abs: 3.3 10*3/uL (ref 0.7–4.0)
MCHC: 32 g/dL (ref 30.0–36.0)
MCV: 84.3 fl (ref 78.0–100.0)
Monocytes Absolute: 0.6 10*3/uL (ref 0.1–1.0)
Monocytes Relative: 6 % (ref 3.0–12.0)
Neutro Abs: 5.9 10*3/uL (ref 1.4–7.7)
Neutrophils Relative %: 59.2 % (ref 43.0–77.0)
Platelets: 318 10*3/uL (ref 150.0–400.0)
RBC: 4.63 Mil/uL (ref 3.87–5.11)
RDW: 15.1 % (ref 11.5–15.5)
WBC: 10 10*3/uL (ref 4.0–10.5)

## 2022-07-15 LAB — URINALYSIS
Hgb urine dipstick: NEGATIVE
Ketones, ur: NEGATIVE
Leukocytes,Ua: NEGATIVE
Nitrite: NEGATIVE
Specific Gravity, Urine: 1.025 (ref 1.000–1.030)
Total Protein, Urine: NEGATIVE
Urine Glucose: NEGATIVE
Urobilinogen, UA: 0.2 (ref 0.0–1.0)
pH: 6 (ref 5.0–8.0)

## 2022-07-15 LAB — COMPREHENSIVE METABOLIC PANEL
ALT: 15 U/L (ref 0–35)
AST: 13 U/L (ref 0–37)
Albumin: 4.2 g/dL (ref 3.5–5.2)
Alkaline Phosphatase: 70 U/L (ref 39–117)
BUN: 14 mg/dL (ref 6–23)
CO2: 32 mEq/L (ref 19–32)
Calcium: 9.6 mg/dL (ref 8.4–10.5)
Chloride: 101 mEq/L (ref 96–112)
Creatinine, Ser: 0.76 mg/dL (ref 0.40–1.20)
GFR: 88.92 mL/min (ref >60.00)
Glucose, Bld: 97 mg/dL (ref 70–99)
Potassium: 3.9 mEq/L (ref 3.5–5.1)
Sodium: 140 mEq/L (ref 135–145)
Total Bilirubin: 0.3 mg/dL (ref 0.2–1.2)
Total Protein: 8.3 g/dL (ref 6.0–8.3)

## 2022-07-15 LAB — POC COVID19 BINAXNOW: SARS Coronavirus 2 Ag: NEGATIVE

## 2022-07-15 LAB — SEDIMENTATION RATE: Sed Rate: 98 mm/hr — ABNORMAL HIGH (ref 0–30)

## 2022-07-15 MED ORDER — PROMETHAZINE-DM 6.25-15 MG/5ML PO SYRP: 5.0000 mL | ORAL_SOLUTION | Freq: Four times a day (QID) | ORAL | 0 refills | Status: DC | PRN

## 2022-07-15 MED ORDER — AZITHROMYCIN 250 MG PO TABS: ORAL_TABLET | ORAL | 0 refills | Status: DC

## 2022-07-15 NOTE — Assessment & Plan Note (Addendum)
Relapsed. Abd CT 2021 w/divericulosis S/p partial hysterectomy R/o appendicitis Labs and abd CT Pt should f/u w/Dr Tarri Glenn

## 2022-07-15 NOTE — Telephone Encounter (Signed)
Key: [id number]

## 2022-07-15 NOTE — Telephone Encounter (Signed)
Per CoverMyMeds [place] was denied  Reason given: This request was denied because you did not meet the following requirements: The requested medication and/or diagnosis are not a covered benefit and are excluded from coverage in accordance with the terms and conditions of your plan benefit. Therefore, this request has been administratively denied.

## 2022-07-15 NOTE — Progress Notes (Signed)
Subjective:  Patient ID: Jessica Davenport, female    DOB: 1965-11-04  Age: 56 y.o. MRN: [id number]  CC: Sore Throat (Swollen throat , body ache , headache, cough and congestion , ears aching)   HPI Jessica Davenport presents for URI sx's x 2-3 days C/o RLQ abd pain x2 weeks (pt had this pain before) now 3/10, it was 10/10 2 weeks ago. No n/v. Poor appetite  Outpatient Medications Prior to Visit  Medication Sig Dispense Refill   clonazePAM (KLONOPIN) 1 MG tablet Take 1 tablet (1 mg total) by mouth 2 (two) times daily as needed for anxiety. 60 tablet 1   EPINEPHrine 0.3 mg/0.3 mL IJ SOAJ injection Inject 0.3 mg into the muscle as needed for anaphylaxis.      Ferric Maltol (ACCRUFER) 30 MG CAPS Take 1 capsule by mouth in the morning and at bedtime. 90 capsule 1   meloxicam (MOBIC) 15 MG tablet Take 1 tablet daily with food for 5-7 days, then as needed     methocarbamol (ROBAXIN) 500 MG tablet Take 1 tablet (500 mg total) by mouth every 8 (eight) hours as needed for muscle spasms. 10 tablet 1   olmesartan (BENICAR) 20 MG tablet Take 1 tablet (20 mg total) by mouth daily. 90 tablet 1   omeprazole (PRILOSEC) 40 MG capsule TAKE ONE CAPSULE BY MOUTH TWICE A DAY 60 capsule 5   potassium chloride SA (KLOR-CON M) 20 MEQ tablet Take 2 tablets by mouth every AM and one tablet every PM 270 tablet 3   traZODone (DESYREL) 50 MG tablet Take 1 tablet (50 mg total) by mouth at bedtime. 90 tablet 1   triamterene-hydrochlorothiazide (DYAZIDE) 37.5-25 MG capsule Take 1 each (1 capsule total) by mouth daily. 90 capsule 0   No facility-administered medications prior to visit.    ROS: Review of Systems  Constitutional:  Positive for fatigue. Negative for activity change, appetite change, chills and unexpected weight change.  HENT:  Positive for postnasal drip and sore throat. Negative for congestion, mouth sores and sinus pressure.   Eyes:  Negative for visual disturbance.  Respiratory:  Negative for cough  and chest tightness.   Gastrointestinal:  Positive for abdominal pain and nausea. Negative for abdominal distention, blood in stool, constipation and diarrhea.  Genitourinary:  Negative for difficulty urinating, frequency and vaginal pain.  Musculoskeletal:  Negative for back pain and gait problem.  Skin:  Negative for pallor and rash.  Neurological:  Negative for dizziness, tremors, weakness, numbness and headaches.  Psychiatric/Behavioral:  Negative for confusion and sleep disturbance.     Objective:  BP 124/78 (BP Location: Right Arm, Patient Position: Sitting, Cuff Size: Normal)   Pulse 81   Temp 99.3 F (37.4 C) (Oral)   Ht '5\' 7"'$  (1.702 m)   Wt (!) 314 lb (142.4 kg)   LMP 11/10/2011   SpO2 99%   BMI 49.18 kg/m   BP Readings from Last 3 Encounters:  07/15/22 124/78  07/08/22 (!) 162/110  05/28/22 (!) 164/85    Wt Readings from Last 3 Encounters:  07/15/22 (!) 314 lb (142.4 kg)  07/08/22 (!) 320 lb (145.2 kg)  05/28/22 (!) 310 lb (140.6 kg)    Physical Exam Constitutional:      General: She is not in acute distress.    Appearance: She is well-developed. She is obese.  HENT:     Head: Normocephalic.     Right Ear: External ear normal.     Left Ear: External ear  normal.     Nose: Nose normal.  Eyes:     General:        Right eye: No discharge.        Left eye: No discharge.     Conjunctiva/sclera: Conjunctivae normal.     Pupils: Pupils are equal, round, and reactive to light.  Neck:     Thyroid: No thyromegaly.     Vascular: No JVD.     Trachea: No tracheal deviation.  Cardiovascular:     Rate and Rhythm: Normal rate and regular rhythm.     Heart sounds: Normal heart sounds.  Pulmonary:     Effort: No respiratory distress.     Breath sounds: No stridor. No wheezing.  Abdominal:     General: Bowel sounds are normal. There is no distension.     Palpations: Abdomen is soft. There is no mass.     Tenderness: There is abdominal tenderness. There is no  guarding or rebound.  Musculoskeletal:        General: No tenderness.     Cervical back: Normal range of motion and neck supple. No rigidity.  Lymphadenopathy:     Cervical: No cervical adenopathy.  Skin:    Findings: No erythema or rash.  Neurological:     Cranial Nerves: No cranial nerve deficit.     Motor: No abnormal muscle tone.     Coordination: Coordination normal.     Deep Tendon Reflexes: Reflexes normal.  Psychiatric:        Behavior: Behavior normal.        Thought Content: Thought content normal.        Judgment: Judgment normal.   LLQ w/pain on palpation No rebound Eryth throat  Lab Results  Component Value Date   WBC 8.6 05/28/2022   HGB 11.7 (L) 05/28/2022   HCT 38.1 05/28/2022   PLT 336 05/28/2022   GLUCOSE 95 07/08/2022   CHOL 229 (H) 07/02/2021   TRIG 90.0 07/02/2021   HDL 61.30 07/02/2021   LDLDIRECT 148.1 06/16/2012   LDLCALC 150 (H) 07/02/2021   ALT 13 05/28/2022   AST 15 05/28/2022   NA 139 07/08/2022   K 4.0 07/08/2022   CL 103 07/08/2022   CREATININE 0.63 07/08/2022   BUN 12 07/08/2022   CO2 30 07/08/2022   TSH 1.66 07/08/2022   HGBA1C 6.4 07/08/2022    US Venous Img Lower  Left (DVT Study)  Result Date: 05/28/2022 CLINICAL DATA:  POSITIVE D-dimer.  LEFT thigh cramping. EXAM: LEFT LOWER EXTREMITY VENOUS DOPPLER ULTRASOUND TECHNIQUE: Gray-scale sonography with compression, as well as color and duplex ultrasound, were performed to evaluate the deep venous system(s) from the level of the common femoral vein through the popliteal and proximal calf veins. COMPARISON:  LEFT lower extremity XRs, 08/19/2020. FINDINGS: VENOUS Normal compressibility of the LEFT common femoral, superficial femoral, and popliteal veins, as well as the visualized calf veins. Visualized portions of profunda femoral vein and great saphenous vein unremarkable. No filling defects to suggest DVT on grayscale or color Doppler imaging. Doppler waveforms show normal direction of  venous flow, normal respiratory plasticity and response to augmentation. Limited views of the contralateral common femoral vein are unremarkable. OTHER No evidence of superficial thrombophlebitis or abnormal fluid collection. Limitations: none IMPRESSION: No evidence of femoropopliteal DVT or superficial thrombophlebitis within the LEFT lower extremity. Michaelle Birks, MD [facility] Radiology Electronically Signed   By: Michaelle Birks M.D.   On: 05/28/2022 17:01  Assessment & Plan:   Problem List Items Addressed This Visit     URI (upper respiratory infection)    Acute COVID(-)      Relevant Medications   azithromycin (ZITHROMAX Z-PAK) 250 MG tablet   Diverticulosis    Discussed w/pt      Chronic RLQ pain    Relapsed. Abd CT 2021 w/divericulosis S/p partial hysterectomy R/o appendicitis Labs and abd CT Pt should f/u w/Dr Tarri Glenn      Relevant Medications   meloxicam (MOBIC) 15 MG tablet   Other Relevant Orders   CBC with Differential/Platelet   Comprehensive metabolic panel   Urinalysis   Sedimentation rate   CT Abdomen Pelvis W Contrast   Other Visit Diagnoses     Acute nonintractable headache, unspecified headache type    -  Primary   Relevant Medications   meloxicam (MOBIC) 15 MG tablet   Other Relevant Orders   POC COVID-19 (Completed)   CBC with Differential/Platelet   Comprehensive metabolic panel   Urinalysis   Sedimentation rate   Lower abdominal pain       Relevant Orders   CT Abdomen Pelvis W Contrast         Meds ordered this encounter  Medications   promethazine-dextromethorphan (PROMETHAZINE-DM) 6.25-15 MG/5ML syrup    Sig: Take 5 mLs by mouth 4 (four) times daily as needed for cough.    Dispense:  240 mL    Refill:  0   azithromycin (ZITHROMAX Z-PAK) 250 MG tablet    Sig: As directed    Dispense:  6 tablet    Refill:  0      Follow-up: Return in about 2 weeks (around 07/29/2022) for a  follow-up visit.  Walker Kehr, MD

## 2022-07-15 NOTE — Patient Instructions (Signed)
Go to ER if worse 

## 2022-07-15 NOTE — Assessment & Plan Note (Signed)
Acute COVID(-)

## 2022-07-15 NOTE — Assessment & Plan Note (Signed)
Discussed w pt

## 2022-07-16 ENCOUNTER — Ambulatory Visit
Admission: RE | Admit: 2022-07-16 | Discharge: 2022-07-16 | Disposition: A | Discharge status: Home / Self Care | Payer: 59 | Source: Ambulatory Visit | Attending: Internal Medicine | Admitting: Internal Medicine

## 2022-07-16 ENCOUNTER — Other Ambulatory Visit: Payer: Self-pay | Admitting: Internal Medicine

## 2022-07-16 DIAGNOSIS — D509 Iron deficiency anemia, unspecified: Secondary | ICD-10-CM

## 2022-07-16 DIAGNOSIS — G8929 Other chronic pain: Secondary | ICD-10-CM

## 2022-07-16 DIAGNOSIS — R1030 Lower abdominal pain, unspecified: Secondary | ICD-10-CM

## 2022-07-16 MED ORDER — FERROUS SULFATE 220 (44 FE) MG/5ML PO SOLN: 220.0000 mg | Freq: Two times a day (BID) | ORAL | 5 refills | Status: DC

## 2022-07-16 MED ORDER — IOPAMIDOL (ISOVUE-300) INJECTION 61%
100.0000 mL | Freq: Once | INTRAVENOUS | Status: AC | PRN
  Administered 2022-07-16: 100 mL via INTRAVENOUS

## 2022-07-23 LAB — COLOGUARD: COLOGUARD: NEGATIVE

## 2022-07-26 ENCOUNTER — Ambulatory Visit (HOSPITAL_COMMUNITY)
Admission: RE | Admit: 2022-07-26 | Discharge: 2022-07-26 | Disposition: A | Discharge status: Home / Self Care | Payer: 59 | Source: Ambulatory Visit | Attending: Internal Medicine | Admitting: Internal Medicine

## 2022-07-26 DIAGNOSIS — I10 Essential (primary) hypertension: Secondary | ICD-10-CM

## 2022-07-27 ENCOUNTER — Ambulatory Visit: Payer: 59 | Admitting: Internal Medicine

## 2022-07-27 ENCOUNTER — Encounter: Payer: Self-pay | Admitting: Internal Medicine

## 2022-07-27 VITALS — BP 134/84 | HR 80 | Temp 98.2°F | Resp 16 | Ht 67.0 in | Wt 317.0 lb

## 2022-07-27 DIAGNOSIS — J069 Acute upper respiratory infection, unspecified: Secondary | ICD-10-CM | POA: Diagnosis not present

## 2022-07-27 DIAGNOSIS — I10 Essential (primary) hypertension: Secondary | ICD-10-CM | POA: Diagnosis not present

## 2022-07-27 NOTE — Progress Notes (Signed)
Subjective:  Patient ID: Jessica Davenport, female    DOB: [birth year]  Age: 57 y.o. MRN: [id number]  CC: Hypertension   HPI Jessica Davenport presents for f/up -  Her cough is improving.  She denies chest pain, shortness of breath, fever, chills, dizziness, lightheadedness, or edema.  She tells me her blood pressure has been well-controlled.  Outpatient Medications Prior to Visit  Medication Sig Dispense Refill   clonazePAM (KLONOPIN) 1 MG tablet Take 1 tablet (1 mg total) by mouth 2 (two) times daily as needed for anxiety. 60 tablet 1   EPINEPHrine 0.3 mg/0.3 mL IJ SOAJ injection Inject 0.3 mg into the muscle as needed for anaphylaxis.      ferrous sulfate 220 (44 Fe) MG/5ML solution Take 5 mLs (220 mg total) by mouth 2 (two) times daily with a meal. 300 mL 5   meloxicam (MOBIC) 15 MG tablet Take 1 tablet daily with food for 5-7 days, then as needed     methocarbamol (ROBAXIN) 500 MG tablet Take 1 tablet (500 mg total) by mouth every 8 (eight) hours as needed for muscle spasms. 10 tablet 1   olmesartan (BENICAR) 20 MG tablet Take 1 tablet (20 mg total) by mouth daily. 90 tablet 1   omeprazole (PRILOSEC) 40 MG capsule TAKE ONE CAPSULE BY MOUTH TWICE A DAY 60 capsule 5   potassium chloride SA (KLOR-CON M) 20 MEQ tablet Take 2 tablets by mouth every AM and one tablet every PM 270 tablet 3   traZODone (DESYREL) 50 MG tablet Take 1 tablet (50 mg total) by mouth at bedtime. 90 tablet 1   triamterene-hydrochlorothiazide (DYAZIDE) 37.5-25 MG capsule Take 1 each (1 capsule total) by mouth daily. 90 capsule 0   azithromycin (ZITHROMAX Z-PAK) 250 MG tablet As directed 6 tablet 0   promethazine-dextromethorphan (PROMETHAZINE-DM) 6.25-15 MG/5ML syrup Take 5 mLs by mouth 4 (four) times daily as needed for cough. 240 mL 0   No facility-administered medications prior to visit.    ROS Review of Systems  Constitutional: Negative.  Negative for diaphoresis and fatigue.  HENT: Negative.     Respiratory:  Positive for cough. Negative for chest tightness and wheezing.   Cardiovascular:  Negative for chest pain, palpitations and leg swelling.  Gastrointestinal:  Negative for abdominal pain, diarrhea, nausea and vomiting.  Genitourinary: Negative.  Negative for difficulty urinating.  Musculoskeletal: Negative.   Skin: Negative.  Negative for rash.  Neurological: Negative.  Negative for dizziness and weakness.  Hematological:  Negative for adenopathy. Does not bruise/bleed easily.  Psychiatric/Behavioral: Negative.      Objective:  BP 134/84 (BP Location: Left Arm, Patient Position: Sitting, Cuff Size: Large)   Pulse 80   Temp 98.2 F (36.8 C) (Oral)   Resp 16   Ht '5\' 7"'$  (1.702 m)   Wt (!) 317 lb (143.8 kg)   LMP 11/10/2011   SpO2 97%   BMI 49.65 kg/m   BP Readings from Last 3 Encounters:  07/27/22 134/84  07/15/22 124/78  07/08/22 (!) 162/110    Wt Readings from Last 3 Encounters:  07/27/22 (!) 317 lb (143.8 kg)  07/15/22 (!) 314 lb (142.4 kg)  07/08/22 (!) 320 lb (145.2 kg)    Physical Exam Vitals reviewed.  Constitutional:      Appearance: She is not ill-appearing.  HENT:     Mouth/Throat:     Mouth: Mucous membranes are moist.  Eyes:     General: No scleral icterus.    Conjunctiva/sclera: Conjunctivae  normal.  Cardiovascular:     Rate and Rhythm: Normal rate and regular rhythm.     Heart sounds: No murmur heard. Pulmonary:     Effort: Pulmonary effort is normal.     Breath sounds: No stridor. No wheezing, rhonchi or rales.  Abdominal:     General: Abdomen is protuberant.     Palpations: There is no mass.     Tenderness: There is no abdominal tenderness. There is no guarding.     Hernia: No hernia is present.  Musculoskeletal:        General: Normal range of motion.     Cervical back: Neck supple.     Right lower leg: No edema.     Left lower leg: No edema.  Lymphadenopathy:     Cervical: No cervical adenopathy.  Skin:    General: Skin is  warm and dry.  Neurological:     General: No focal deficit present.     Mental Status: She is alert.  Psychiatric:        Mood and Affect: Mood normal.        Behavior: Behavior normal.     Lab Results  Component Value Date   WBC 10.0 07/15/2022   HGB 12.5 07/15/2022   HCT 39.1 07/15/2022   PLT 318.0 07/15/2022   GLUCOSE 97 07/15/2022   CHOL 229 (H) 07/02/2021   TRIG 90.0 07/02/2021   HDL 61.30 07/02/2021   LDLDIRECT 148.1 06/16/2012   LDLCALC 150 (H) 07/02/2021   ALT 15 07/15/2022   AST 13 07/15/2022   NA 140 07/15/2022   K 3.9 07/15/2022   CL 101 07/15/2022   CREATININE 0.76 07/15/2022   BUN 14 07/15/2022   CO2 32 07/15/2022   TSH 1.66 07/08/2022   HGBA1C 6.4 07/08/2022    VAS US RENAL ARTERY DUPLEX  Result Date: 07/26/2022 ABDOMINAL VISCERAL Patient Name:  Jessica Davenport  Date of Exam:   07/26/2022 Medical Rec #: [id number]          Accession #:    [id number] Date of Birth: 1965-10-13          Patient Gender: F Patient Age:   57 years Exam Location:  [facility] Procedure:      VAS US RENAL ARTERY DUPLEX Referring Phys: [place] -------------------------------------------------------------------------------- Indications: Hypertension High Risk Factors: Hypertension, no history of smoking. Other Factors: ASD. Limitations: Air/bowel gas and obesity. Comparison Study: 07/16/22 CTA ABD Performing Technologist: Elta Guadeloupe RVT, RDMS  Examination Guidelines: A complete evaluation includes B-mode imaging, spectral Doppler, color Doppler, and power Doppler as needed of all accessible portions of each vessel. Bilateral testing is considered an integral part of a complete examination. Limited examinations for reoccurring indications may be performed as noted.  Duplex Findings: +------------------+--------+--------+-------+ Right Renal ArteryPSV cm/sEDV cm/sComment +------------------+--------+--------+-------+ Proximal             89      28            +------------------+--------+--------+-------+ Mid                 128      45           +------------------+--------+--------+-------+ Distal              136      46           +------------------+--------+--------+-------+ +-----------------+--------+--------+-------+ Left Renal ArteryPSV cm/sEDV cm/sComment +-----------------+--------+--------+-------+ Proximal  86      26           +-----------------+--------+--------+-------+ Mid                101      30           +-----------------+--------+--------+-------+ Distal             125      38           +-----------------+--------+--------+-------+ +------------+--------+--------+--+-----------+--------+--------+---+ Right KidneyPSV cm/sEDV cm/sRILeft KidneyPSV cm/sEDV cm/sRI  +------------+--------+--------+--+-----------+--------+--------+---+ Upper Pole                    Upper Pole                     +------------+--------+--------+--+-----------+--------+--------+---+ Mid                           Mid                            +------------+--------+--------+--+-----------+--------+--------+---+ Lower Pole                    Lower Pole                     +------------+--------+--------+--+-----------+--------+--------+---+ Hilar                         Hilar                          +------------+--------+--------+--+-----------+--------+--------+---+ +------------------+-----+------------------+-----+ Right Kidney           Left Kidney             +------------------+-----+------------------+-----+ RAR                    RAR                     +------------------+-----+------------------+-----+ RAR (manual)           RAR (manual)            +------------------+-----+------------------+-----+ Cortex                 Cortex                  +------------------+-----+------------------+-----+ Cortex thickness       Corex thickness          +------------------+-----+------------------+-----+ Kidney length (cm)10.60Kidney length (cm)10.13 +------------------+-----+------------------+-----+  Summary: Renal:  Right: Normal size right kidney. No evidence of right renal artery        stenosis. RRV flow present. Left:  Normal size of left kidney. No evidence of left renal artery        stenosis. LRV flow present.  *See table(s) above for measurements and observations.  Diagnosing physician: Harold Barban MD  Electronically signed by Harold Barban MD on 07/26/2022 at 6:49:00 PM.    Final     Assessment & Plan:   Jessica Davenport was seen today for hypertension.  Diagnoses and all orders for this visit:  Accelerated hypertension- Her blood pressure is adequately well-controlled.  Viral URI- Symptoms are improving.   I have discontinued Jessica Davenport promethazine-dextromethorphan and azithromycin. I am also having her maintain her EPINEPHrine, potassium chloride SA, omeprazole, clonazePAM, traZODone, olmesartan, methocarbamol, triamterene-hydrochlorothiazide, meloxicam, and ferrous sulfate.  No orders  of the defined types were placed in this encounter.    Follow-up: No follow-ups on file.  [name], MD

## 2022-07-30 DIAGNOSIS — J069 Acute upper respiratory infection, unspecified: Secondary | ICD-10-CM | POA: Insufficient documentation

## 2022-08-03 ENCOUNTER — Ambulatory Visit (INDEPENDENT_AMBULATORY_CARE_PROVIDER_SITE_OTHER): Payer: 59 | Admitting: Behavioral Health

## 2022-08-03 DIAGNOSIS — F411 Generalized anxiety disorder: Secondary | ICD-10-CM

## 2022-08-03 NOTE — Progress Notes (Signed)
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  Mental Status Exam: Appearance:   Casual     Behavior:  Appropriate and Sharing  Motor:  Normal  Speech/Language:   Clear and Coherent  Affect:  Appropriate  Mood:  normal  Thought process:  normal  Thought content:    WNL  Sensory/Perceptual disturbances:    WNL  Orientation:  oriented to person, place, and time/date  Attention:  Good  Concentration:  Good  Memory:  WNL  Fund of knowledge:   Good  Insight:    Good  Judgment:   Good  Impulse Control:  Good   Risk Assessment: Danger to Self:  No Self-injurious Behavior: No Danger to Others: No Duty to Warn:no Physical Aggression / Violence:No  Access to Firearms a concern: No  Gang Involvement:No  Patient / guardian was educated about steps to take if suicide or homicide risk level increases between visits: yes While future psychiatric events cannot be accurately predicted, the patient does not currently require acute inpatient psychiatric care and does not currently meet [facility] involuntary commitment criteria.  Substance Abuse History: Current substance abuse: No     Past Psychiatric History:   No previous psychological problems have been observed Outpatient Providers: [name], MD History of Psych Hospitalization: No  Psychological Testing:  NA    Abuse History:  Victim of: No.,  NA    Report needed: No. Victim of Neglect:No. Perpetrator of  NA   Witness / Exposure to Domestic Violence: No   Protective Services Involvement: No  Witness to [employer] Violence:  No   Family History:  Family History  Problem Relation Age  of Onset   Coronary artery disease Mother    Hypertension Mother    Diabetes Mother    Breast cancer Mother    Coronary artery disease Father    Hypertension Father    Diabetes Father    Anxiety disorder Sister    Ovarian cancer Maternal Aunt    Breast cancer Maternal Aunt    Colon cancer Maternal Uncle    Cervical cancer Maternal Aunt     Living situation: the patient lives with their family  Sexual Orientation: Straight  Relationship Status: Unk; No Partner mentioned today. Will claify @ next visit.  Name of spouse / other:Unk If a parent, number of children / ages:55yo Christian, [place]: friends Extended [place]:  No   Income/Employment/Disability: Employment Banker for 14 yrs; now in the Energy manager Service: No   Educational History: Education: high school diploma/GED  Religion/Sprituality/World View: [employer]  Any cultural differences that may affect / interfere with treatment:  None noted today  Recreation/Hobbies: reading  Stressors: Other: Work stress has inc'd since the death of her Mother on Jul 15, 2021. She has been unable to hold her grief & manage it when she is working. It is not debilitating, but sometimes she gets emotional. Pt is finding it hard to cope, even though she now works FT remote in her [employer].    Strengths: Supportive Relationships, Family, Friends, Church, Spirituality, Hopefulness, Conservator, museum/gallery,  and Able to Communicate Effectively  Barriers:  None noted today   Legal History: Pending legal issue / charges: The patient has no significant history of legal issues. History of legal issue / charges:  NA  Medical History/Surgical History: reviewed Past Medical History:  Diagnosis Date   Abscess    Allergy    Bilateral lower extremity edema 12/02/2016   Essential hypertension    Fibroid, uterine    Infected sebaceous  cyst    mons pubis   Iron deficiency anemia 08/19/2009        Lower extremity edema    Lumbar disc disease    Morbid obesity ([place])    Obesity, Class III, BMI 40-49.9 (morbid obesity) ([place]) 02/15/2012   Ostium secundum type atrial septal defect    s/p 28 mm Amplatzer device closure in 2005   Pulmonary HTN ([place]) 08/20/2009        Pure hypercholesterolemia 06/16/2012   Small bowel obstruction ([place]) 2013   Vitamin D deficiency     Past Surgical History:  Procedure Laterality Date   ABDOMINAL HYSTERECTOMY  12/07/2011   ASD REPAIR  2005/2009   ENDOMETRIAL ABLATION  2010,2011,2012   teeth removal     TUBAL LIGATION  07/19/1994   uterine ablation      Medications: Current Outpatient Medications  Medication Sig Dispense Refill   clonazePAM (KLONOPIN) 1 MG tablet Take 1 tablet (1 mg total) by mouth 2 (two) times daily as needed for anxiety. 60 tablet 1   EPINEPHrine 0.3 mg/0.3 mL IJ SOAJ injection Inject 0.3 mg into the muscle as needed for anaphylaxis.      ferrous sulfate 220 (44 Fe) MG/5ML solution Take 5 mLs (220 mg total) by mouth 2 (two) times daily with a meal. 300 mL 5   meloxicam (MOBIC) 15 MG tablet Take 1 tablet daily with food for 5-7 days, then as needed     methocarbamol (ROBAXIN) 500 MG tablet Take 1 tablet (500 mg total) by mouth every 8 (eight) hours as needed for muscle spasms. 10 tablet 1   olmesartan (BENICAR) 20 MG tablet Take 1 tablet (20 mg total) by mouth daily. 90 tablet 1   omeprazole (PRILOSEC) 40 MG capsule TAKE ONE CAPSULE BY MOUTH TWICE A DAY 60 capsule 5   potassium chloride SA (KLOR-CON M) 20 MEQ tablet Take 2 tablets by mouth every AM and one tablet every PM 270 tablet 3   traZODone (DESYREL) 50 MG tablet Take 1 tablet (50 mg total) by mouth at bedtime. 90 tablet 1   triamterene-hydrochlorothiazide (DYAZIDE) 37.5-25 MG capsule Take 1 each (1 capsule total) by mouth daily. 90 capsule 0   No current facility-administered medications for this visit.     Allergies  Allergen Reactions   Grass Pollen(K-O-R-T-Swt Vern) Hives   Lisinopril Hives   Other     Celery and green peas    Diagnoses:  GAD (generalized anxiety disorder)  Plan of Care: Ezella describes feeling overwhelmed by the death of her Mother @ the end of 2022. This past Holiday season was challenging & there is a lot of hurt & grief around her Family & how her Mother's care @ end-of-life was handled & @ her death by her StepFr. Pt is processing a great deal of emotion @ this time. She will keep a Notebook to record her feelings/thoughts btwn sessions to facilitate our work in psychotherapy.  Target Date: 10/02/2022  Progress: 0  Frequency: Twice monthly  Modality: Cherlyn Labella L  Nachmen Mansel, LMFT

## 2022-08-03 NOTE — Progress Notes (Signed)
                Jessica Davenport L Tayden Duran, LMFT 

## 2022-08-10 ENCOUNTER — Other Ambulatory Visit: Payer: Self-pay | Admitting: Internal Medicine

## 2022-08-10 DIAGNOSIS — I10 Essential (primary) hypertension: Secondary | ICD-10-CM

## 2022-08-10 NOTE — Telephone Encounter (Signed)
Ok to pcp please °

## 2022-08-18 ENCOUNTER — Ambulatory Visit: Payer: 59 | Admitting: Behavioral Health

## 2022-08-19 ENCOUNTER — Ambulatory Visit: Payer: 59 | Admitting: Internal Medicine

## 2022-08-25 ENCOUNTER — Ambulatory Visit: Payer: 59 | Admitting: Gastroenterology

## 2022-08-31 ENCOUNTER — Ambulatory Visit: Payer: 59 | Admitting: Behavioral Health

## 2022-09-23 ENCOUNTER — Ambulatory Visit: Payer: 59 | Admitting: Gastroenterology

## 2022-09-23 ENCOUNTER — Encounter: Payer: Self-pay | Admitting: Gastroenterology

## 2022-09-23 ENCOUNTER — Other Ambulatory Visit (INDEPENDENT_AMBULATORY_CARE_PROVIDER_SITE_OTHER): Payer: 59

## 2022-09-23 VITALS — BP 110/78 | HR 80 | Ht 65.5 in | Wt 316.5 lb

## 2022-09-23 DIAGNOSIS — R195 Other fecal abnormalities: Secondary | ICD-10-CM

## 2022-09-23 DIAGNOSIS — R1013 Epigastric pain: Secondary | ICD-10-CM

## 2022-09-23 DIAGNOSIS — G8929 Other chronic pain: Secondary | ICD-10-CM

## 2022-09-23 DIAGNOSIS — Z8379 Family history of other diseases of the digestive system: Secondary | ICD-10-CM

## 2022-09-23 LAB — CBC WITH DIFFERENTIAL/PLATELET
Basophils Absolute: 0.1 10*3/uL (ref 0.0–0.1)
Basophils Relative: 0.8 % (ref 0.0–3.0)
Eosinophils Absolute: 0.1 10*3/uL (ref 0.0–0.7)
Eosinophils Relative: 1.3 % (ref 0.0–5.0)
HCT: 36.5 % (ref 36.0–46.0)
Hemoglobin: 11.9 g/dL — ABNORMAL LOW (ref 12.0–15.0)
Lymphocytes Relative: 36.2 % (ref 12.0–46.0)
Lymphs Abs: 2.8 10*3/uL (ref 0.7–4.0)
MCHC: 32.5 g/dL (ref 30.0–36.0)
MCV: 84.2 fl (ref 78.0–100.0)
Monocytes Absolute: 0.4 10*3/uL (ref 0.1–1.0)
Monocytes Relative: 4.8 % (ref 3.0–12.0)
Neutro Abs: 4.3 10*3/uL (ref 1.4–7.7)
Neutrophils Relative %: 56.9 % (ref 43.0–77.0)
Platelets: 347 10*3/uL (ref 150.0–400.0)
RBC: 4.34 Mil/uL (ref 3.87–5.11)
RDW: 14.6 % (ref 11.5–15.5)
WBC: 7.6 10*3/uL (ref 4.0–10.5)

## 2022-09-23 LAB — COMPREHENSIVE METABOLIC PANEL
ALT: 12 U/L (ref 0–35)
AST: 12 U/L (ref 0–37)
Albumin: 3.7 g/dL (ref 3.5–5.2)
Alkaline Phosphatase: 74 U/L (ref 39–117)
BUN: 11 mg/dL (ref 6–23)
CO2: 30 mEq/L (ref 19–32)
Calcium: 9.9 mg/dL (ref 8.4–10.5)
Chloride: 101 mEq/L (ref 96–112)
Creatinine, Ser: 0.82 mg/dL (ref 0.40–1.20)
GFR: 81.06 mL/min (ref >60.00)
Glucose, Bld: 106 mg/dL — ABNORMAL HIGH (ref 70–99)
Potassium: 3.8 mEq/L (ref 3.5–5.1)
Sodium: 139 mEq/L (ref 135–145)
Total Bilirubin: 0.4 mg/dL (ref 0.2–1.2)
Total Protein: 7.7 g/dL (ref 6.0–8.3)

## 2022-09-23 LAB — LIPASE: Lipase: 23 U/L (ref 11.0–59.0)

## 2022-09-23 NOTE — Progress Notes (Signed)
Referring Provider: Janith Lima, MD Primary Care Physician:  Janith Lima, MD  Reason for Consultation:  Abdominal pain   IMPRESSION:  Recurrent abdominal pain with a history of H pylori. Not explained by CT scan.   ? Melena with normal CBC  LA Grade A reflux esophagitis with a small hiatal hernia on EGD 2022.  On PPI therapy.   Colon cancer screening. Cologuard negative x 2, most recently 2024.   Family history of gallbladder disease   PLAN: - CBC, CMP, lipase - [facility] H pylori testing to confirm eradication   - HIDA with CCK to evaluate for symptomatic gallbladder disease - EGD with biopsies of the esophagus, gastric, and duodenum if gallbladder testing - Continue omeprazole 40 mg BID - MyChart message with escalating symptoms in the meantime  HPI: Jessica Davenport is a 57 y.o. female who presents today for the evaluation of nausea and abdominal pain.  Seen 03/2020 for post-prandial RUQ abdominal pain with associated nausea and change in bowel habits. She described the pain as intermittently sharp, intermittently dull, intermittently achy, and frequently associated with abdominal wall pain overlying the right upper quadrant.  Associated anorexia, loose stools, and hyperosmia. No specific food triggers.  Temporally associated with stress.  No change with movement or defecation. No blood or mucous in the stool.  Endoscopic evaluation recommended but not scheduled.  Seen 06/17/21 for the same symptoms.  Ready to proceed with endoscopic evaluation.  She declined colonoscopy.  EGD 06/26/2021 showed LA grade a reflux esophagitis.  Esophageal biopsies were eosinophilic esophagitis were negative.  She had a small hiatal hernia.  She had H. pylori gastritis.  Duodenal biopsies showed peptic duodenitis.  She was treated with metronidazole, tetracycline, Pepto-Bismol, and omeprazole.  H. pylori stool antigen - 09/07/2021  She returns today reporting ongoing diffuse but  predominantly abdominal pain with associated nausea.  She notes dark tarry stools during these episodes of pain. Pain has now largely resolved, but, she is fearful that it will recur given the severity of the symptoms. She has been concerned that it's her gallbladder given her family history. She does not eat fried or greasy foods at baseline. Appetite is returning following the death of her parents. Weight has increased. Eating, movement, or defecation does not change her symptoms.   No recent NSAIDs. She has been using Tylenol for neck and shoulder pain.   Recent evaluation has included:  01/15/20 Abdominal films showed no obstruction.  01/17/20 CT abd/pelvis with contrast showed diverticulosis without diverticulitis, no acute abnormality 01/23/20 Abdominal ultrasound essentially normal  07/17/2022 CT of the abdomen and pelvis with contrast was negative  She had a Cologard 02/2019 and again in 2020 for that were normal. Does not want to have a colonoscopy.   Labs 07/15/22: CBC normal, iron 49, ferritin 10.8  Gallbladder disease in her mother and sister. She thinks a maternal uncle may have had colon cancer.  No other known family history of colon cancer or polyps. No family history of uterine/endometrial cancer, pancreatic cancer or gastric/stomach cancer.   Past Medical History:  Diagnosis Date   Abscess    Allergy    Bilateral lower extremity edema 12/02/2016   Essential hypertension    Fibroid, uterine    Infected sebaceous cyst    mons pubis   Iron deficiency anemia 08/19/2009        Lower extremity edema    Lumbar disc disease    Morbid obesity ([facility])    Obesity, Class  III, BMI 40-49.9 (morbid obesity) ([place]) 02/15/2012   Ostium secundum type atrial septal defect    s/p 28 mm Amplatzer device closure in 2005   Pulmonary HTN ([place]) 08/20/2009        Pure hypercholesterolemia 06/16/2012   Small bowel obstruction ([place]) 2013   Vitamin D deficiency     Past Surgical History:  Procedure  Laterality Date   ABDOMINAL HYSTERECTOMY  12/07/2011   ASD REPAIR  2005/2009   ENDOMETRIAL ABLATION  2010,2011,2012   teeth removal     TUBAL LIGATION  07/19/1994   uterine ablation      Current Outpatient Medications  Medication Sig Dispense Refill   acetaminophen (TYLENOL) 500 MG tablet Take 500-1,000 mg by mouth as needed.     albuterol (VENTOLIN HFA) 108 (90 Base) MCG/ACT inhaler Inhale 1 puff into the lungs as needed.     aspirin-acetaminophen-caffeine (EXCEDRIN MIGRAINE) 250-250-65 MG tablet Take 1 tablet by mouth as needed.     clonazePAM (KLONOPIN) 1 MG tablet Take 1 tablet (1 mg total) by mouth 2 (two) times daily as needed for anxiety. 60 tablet 1   ferrous sulfate 220 (44 Fe) MG/5ML solution Take 5 mLs (220 mg total) by mouth 2 (two) times daily with a meal. 300 mL 5   meloxicam (MOBIC) 15 MG tablet Take 1 tablet daily with food for 5-7 days, then as needed     methocarbamol (ROBAXIN) 500 MG tablet Take 1 tablet (500 mg total) by mouth every 8 (eight) hours as needed for muscle spasms. 10 tablet 1   olmesartan (BENICAR) 20 MG tablet TAKE 1 TABLET BY MOUTH DAILY 90 tablet 1   omeprazole (PRILOSEC) 40 MG capsule TAKE ONE CAPSULE BY MOUTH TWICE A DAY 60 capsule 5   potassium chloride SA (KLOR-CON M) 20 MEQ tablet Take 2 tablets by mouth every AM and one tablet every PM 270 tablet 3   traZODone (DESYREL) 50 MG tablet TAKE 1 TABLET BY MOUTH AT BEDTIME 90 tablet 1   triamterene-hydrochlorothiazide (DYAZIDE) 37.5-25 MG capsule Take 1 each (1 capsule total) by mouth daily. 90 capsule 0   EPINEPHrine 0.3 mg/0.3 mL IJ SOAJ injection Inject 0.3 mg into the muscle as needed for anaphylaxis.  (Patient not taking: Reported on 09/23/2022)     No current facility-administered medications for this visit.    Allergies as of 09/23/2022 - Review Complete 09/23/2022  Allergen Reaction Noted   Grass pollen(k-o-r-t-swt vern) Hives 05/28/2022   Lisinopril Hives 01/25/2019   Other  06/01/2012     Family History  Problem Relation Age of Onset   Coronary artery disease Mother    Hypertension Mother    Diabetes Mother    Breast cancer Mother    Coronary artery disease Father    Hypertension Father    Diabetes Father    Anxiety disorder Sister    Ovarian cancer Maternal Aunt    Breast cancer Maternal Aunt    Colon cancer Maternal Uncle    Cervical cancer Maternal Aunt      Physical Exam: General:   Alert,  well-nourished, pleasant and cooperative in NAD Head:  Normocephalic and atraumatic. Eyes:  Sclera clear, no icterus.   Conjunctiva pink. Abdomen:  Soft, central obesity, nontender, nondistended, normal bowel sounds, no rebound or guarding. No hepatosplenomegaly. I am unable to reproduce her symptoms on exam.    Neurologic:  Alert and  oriented x4;  grossly nonfocal Skin:  Intact without significant lesions or rashes. Psych:  Alert and cooperative.  Normal mood and affect.    Kathrynn Backstrom L. Tarri Glenn, MD, MPH 09/23/2022, 9:54 AM

## 2022-09-23 NOTE — Patient Instructions (Signed)
Continue Omeprazole 40 mg twice daily.   Your provider has requested that you go to the basement level for lab work before leaving today. Press "B" on the elevator. The lab is located at the first door on the left as you exit the elevator.  Your provider has ordered "Diatherix" stool testing for you. You have received a kit from our office today containing all necessary supplies to complete this test. Please carefully read the stool collection instructions provided in the kit before opening the accompanying materials. In addition, be sure to place the label from the top left corner of the laboratory request sheet onto the "puritan opti-swab" tube that is supplied in the kit. This label should include your full name and date of birth. After completing the test, you should secure the purtian tube into the specimen biohazard bag. The laboratory request information sheet (including date and time of specimen collection) should be placed into the outside pocket of the specimen biohazard bag and returned to the [place] lab with 2 days of collection.   You have been scheduled for a HIDA scan at [facility] (1st floor) on Thursday 10/07/22. Please arrive 30 minutes prior to your scheduled appointment at  7 am. Make certain not to have anything to eat or drink at least 6 hours prior to your test. Should this appointment date or time not work well for you, please call radiology scheduling at [phone].  _____________________________________________________________________ hepatobiliary (HIDA) scan is an imaging procedure used to diagnose problems in the liver, gallbladder and bile ducts. In the HIDA scan, a radioactive chemical or tracer is injected into a vein in your arm. The tracer is handled by the liver like bile. Bile is a fluid produced and excreted by your liver that helps your digestive system break down fats in the foods you eat. Bile is stored in your gallbladder and the gallbladder releases the  bile when you eat a meal. A special nuclear medicine scanner (gamma camera) tracks the flow of the tracer from your liver into your gallbladder and small intestine.  During your HIDA scan  You'll be asked to change into a hospital gown before your HIDA scan begins. Your health care team will position you on a table, usually on your back. The radioactive tracer is then injected into a vein in your arm.The tracer travels through your bloodstream to your liver, where it's taken up by the bile-producing cells. The radioactive tracer travels with the bile from your liver into your gallbladder and through your bile ducts to your small intestine.You may feel some pressure while the radioactive tracer is injected into your vein. As you lie on the table, a special gamma camera is positioned over your abdomen taking pictures of the tracer as it moves through your body. The gamma camera takes pictures continually for about an hour. You'll need to keep still during the HIDA scan. This can become uncomfortable, but you may find that you can lessen the discomfort by taking deep breaths and thinking about other things. Tell your health care team if you're uncomfortable. The radiologist will watch on a computer the progress of the radioactive tracer through your body. The HIDA scan may be stopped when the radioactive tracer is seen in the gallbladder and enters your small intestine. This typically takes about an hour. In some cases extra imaging will be performed if original images aren't satisfactory, if morphine is given to help visualize the gallbladder or if the medication CCK is given to  look at the contraction of the gallbladder. This test typically takes 2 hours to complete. ________________________________________________________________________  _______________________________________________________  If your blood pressure at your visit was 140/90 or greater, please contact your primary care physician to follow up  on this.  _______________________________________________________  If you are age 55 or older, your body mass index should be between 23-30. Your Body mass index is 51.87 kg/m. If this is out of the aforementioned range listed, please consider follow up with your Primary Care Provider.  If you are age 55 or younger, your body mass index should be between 19-25. Your Body mass index is 51.87 kg/m. If this is out of the aformentioned range listed, please consider follow up with your Primary Care Provider.   ________________________________________________________  The [facility] providers would like to encourage you to use [facility] to communicate with providers for non-urgent requests or questions.  Due to long hold times on the telephone, sending your provider a message by [facility] may be a faster and more efficient way to get a response.  Please allow 48 business hours for a response.  Please remember that this is for non-urgent requests.  _______________________________________________________

## 2022-09-24 ENCOUNTER — Other Ambulatory Visit: Payer: Self-pay | Admitting: *Deleted

## 2022-09-24 DIAGNOSIS — D509 Iron deficiency anemia, unspecified: Secondary | ICD-10-CM

## 2022-10-02 ENCOUNTER — Other Ambulatory Visit: Payer: Self-pay | Admitting: Internal Medicine

## 2022-10-02 DIAGNOSIS — I10 Essential (primary) hypertension: Secondary | ICD-10-CM

## 2022-10-07 ENCOUNTER — Encounter (HOSPITAL_COMMUNITY)
Admission: RE | Admit: 2022-10-07 | Discharge: 2022-10-07 | Disposition: A | Discharge status: Home / Self Care | Payer: 59 | Source: Ambulatory Visit | Attending: Gastroenterology | Admitting: Gastroenterology

## 2022-10-07 DIAGNOSIS — R1013 Epigastric pain: Secondary | ICD-10-CM | POA: Insufficient documentation

## 2022-10-07 DIAGNOSIS — G8929 Other chronic pain: Secondary | ICD-10-CM | POA: Insufficient documentation

## 2022-10-07 MED ORDER — TECHNETIUM TC 99M MEBROFENIN IV KIT
5.2000 | PACK | Freq: Once | INTRAVENOUS | Status: AC
  Administered 2022-10-07: 5.2 via INTRAVENOUS

## 2022-10-18 ENCOUNTER — Encounter: Payer: Self-pay | Admitting: Internal Medicine

## 2022-10-18 ENCOUNTER — Ambulatory Visit: Payer: 59 | Admitting: Internal Medicine

## 2022-10-18 VITALS — BP 126/80 | HR 80 | Temp 98.5°F | Ht 65.5 in | Wt 315.2 lb

## 2022-10-18 DIAGNOSIS — J069 Acute upper respiratory infection, unspecified: Secondary | ICD-10-CM

## 2022-10-18 LAB — POCT RAPID STREP A (OFFICE): Rapid Strep A Screen: NEGATIVE

## 2022-10-18 LAB — POC COVID19 BINAXNOW: SARS Coronavirus 2 Ag: NEGATIVE

## 2022-10-18 MED ORDER — ALBUTEROL SULFATE HFA 108 (90 BASE) MCG/ACT IN AERS: 1.0000 | INHALATION_SPRAY | RESPIRATORY_TRACT | 5 refills | Status: DC | PRN

## 2022-10-18 MED ORDER — HYDROCODONE BIT-HOMATROP MBR 5-1.5 MG/5ML PO SOLN: 5.0000 mL | Freq: Three times a day (TID) | ORAL | 0 refills | Status: DC | PRN

## 2022-10-18 MED ORDER — AMOXICILLIN-POT CLAVULANATE 875-125 MG PO TABS: 1.0000 | ORAL_TABLET | Freq: Two times a day (BID) | ORAL | 0 refills | Status: DC

## 2022-10-18 NOTE — Progress Notes (Signed)
Subjective:    Patient ID: Jessica Davenport, female    DOB: [birth year], 57 y.o.   MRN: [id number]      HPI Caly is here for  Chief Complaint  Patient presents with   Cough    Symptoms started last Tuesday; Cough, congestion, sore throat and pain in ears     She is here for an acute visit for cold symptoms.   Her symptoms started Tuesday night - 6 days ago  She is experiencing nasal congestion, ear pressure, postnasal drip, sinus pressure, sore throat, cough that is productive of discolored mucus and headaches.  Her symptoms have not been getting better and she feels her cough is getting worse.  She has tried taking  theraflu, tea, afrin    Medications and allergies reviewed with patient and updated if appropriate.  Current Outpatient Medications on File Prior to Visit  Medication Sig Dispense Refill   acetaminophen (TYLENOL) 500 MG tablet Take 500-1,000 mg by mouth as needed.     albuterol (VENTOLIN HFA) 108 (90 Base) MCG/ACT inhaler Inhale 1 puff into the lungs as needed.     aspirin-acetaminophen-caffeine (EXCEDRIN MIGRAINE) 250-250-65 MG tablet Take 1 tablet by mouth as needed.     clonazePAM (KLONOPIN) 1 MG tablet Take 1 tablet (1 mg total) by mouth 2 (two) times daily as needed for anxiety. 60 tablet 1   EPINEPHrine 0.3 mg/0.3 mL IJ SOAJ injection Inject 0.3 mg into the muscle as needed for anaphylaxis.     ferrous sulfate 220 (44 Fe) MG/5ML solution Take 5 mLs (220 mg total) by mouth 2 (two) times daily with a meal. 300 mL 5   meloxicam (MOBIC) 15 MG tablet Take 1 tablet daily with food for 5-7 days, then as needed     methocarbamol (ROBAXIN) 500 MG tablet Take 1 tablet (500 mg total) by mouth every 8 (eight) hours as needed for muscle spasms. 10 tablet 1   olmesartan (BENICAR) 20 MG tablet TAKE 1 TABLET BY MOUTH DAILY 90 tablet 1   omeprazole (PRILOSEC) 40 MG capsule TAKE ONE CAPSULE BY MOUTH TWICE A DAY 60 capsule 5   potassium chloride SA (KLOR-CON M) 20 MEQ  tablet Take 2 tablets by mouth every AM and one tablet every PM 270 tablet 3   traZODone (DESYREL) 50 MG tablet TAKE 1 TABLET BY MOUTH AT BEDTIME 90 tablet 1   triamterene-hydrochlorothiazide (DYAZIDE) 37.5-25 MG capsule TAKE 1 CAPSULE BY MOUTH DAILY 90 capsule 0   No current facility-administered medications on file prior to visit.    Review of Systems  Constitutional:  Negative for chills and fever.  HENT:  Positive for congestion, ear pain, postnasal drip, sinus pressure (frontal and cheeks) and sore throat.   Respiratory:  Positive for cough (productive - yellow- green mucus). Negative for shortness of breath and wheezing.   Gastrointestinal:  Negative for diarrhea and nausea.  Neurological:  Positive for headaches (mild - frontal).       Objective:   Vitals:   10/18/22 0754  BP: 126/80  Pulse: 80  Temp: 98.5 F (36.9 C)  SpO2: 97%   BP Readings from Last 3 Encounters:  10/18/22 126/80  09/23/22 110/78  07/27/22 134/84   Wt Readings from Last 3 Encounters:  10/18/22 (!) 315 lb 3.2 oz (143 kg)  09/23/22 (!) 316 lb 8 oz (143.6 kg)  07/27/22 (!) 317 lb (143.8 kg)   Body mass index is 51.65 kg/m.    Physical Exam Constitutional:  General: She is not in acute distress.    Appearance: Normal appearance. She is not ill-appearing.  HENT:     Head: Normocephalic and atraumatic.     Right Ear: Tympanic membrane, ear canal and external ear normal.     Left Ear: Tympanic membrane, ear canal and external ear normal.     Mouth/Throat:     Mouth: Mucous membranes are moist.     Pharynx: No oropharyngeal exudate or posterior oropharyngeal erythema.  Eyes:     Conjunctiva/sclera: Conjunctivae normal.  Cardiovascular:     Rate and Rhythm: Normal rate and regular rhythm.  Pulmonary:     Effort: Pulmonary effort is normal. No respiratory distress.     Breath sounds: Normal breath sounds. No wheezing or rales.  Musculoskeletal:     Cervical back: Neck supple. No  tenderness.  Lymphadenopathy:     Cervical: No cervical adenopathy.  Skin:    General: Skin is warm and dry.  Neurological:     Mental Status: She is alert.            Assessment & Plan:    URI: Acute With asthma but does not seem to be flared at this time Concern for bacterial cause COVID test here negative Strep test here negative Having surgery soon Start Augmentin 875-125 mg twice daily x 10 days Hycodan cough syrup Albuterol inhaler sent to pharmacy-she has not been using it and is not sure where her doses-advised to use for any chest tightness, wheezing or shortness of breath.  May also benefit from inhaler if cough is not improving Over-the-counter cold medications as needed Rest, fluids Call if no improvement  Hypertension: Chronic Well-controlled Over-the-counter cold medication should be hypertension friendly-Coricidin products Continue Benicar 20 mg daily, Dyazide 37.5-25 mg daily

## 2022-10-18 NOTE — Patient Instructions (Addendum)
      Medications changes include :   Augmentin, albuterol, cough syrup     Return if symptoms worsen or fail to improve.

## 2022-10-18 NOTE — Addendum Note (Signed)
Addended by: Marcina Millard on: 10/18/2022 09:24 AM   Modules accepted: Orders

## 2022-10-19 ENCOUNTER — Encounter: Payer: Self-pay | Admitting: Internal Medicine

## 2022-10-22 ENCOUNTER — Other Ambulatory Visit: Payer: Self-pay

## 2022-10-22 ENCOUNTER — Encounter: Payer: Self-pay | Admitting: Internal Medicine

## 2022-10-22 MED ORDER — ALBUTEROL SULFATE HFA 108 (90 BASE) MCG/ACT IN AERS: 2.0000 | INHALATION_SPRAY | Freq: Four times a day (QID) | RESPIRATORY_TRACT | 2 refills | Status: DC | PRN

## 2022-11-05 ENCOUNTER — Other Ambulatory Visit: Payer: Self-pay | Admitting: Gastroenterology

## 2022-11-05 DIAGNOSIS — G8929 Other chronic pain: Secondary | ICD-10-CM

## 2022-11-19 ENCOUNTER — Ambulatory Visit: Payer: 59 | Admitting: Family Medicine

## 2022-12-21 ENCOUNTER — Telehealth: Payer: Self-pay | Admitting: Gastroenterology

## 2022-12-21 NOTE — Telephone Encounter (Signed)
Inbound call from patient stating she had a stool sample sent to the lab in April with Dr. Orvan Falconer. States she never heard anything back regarding results. She is requesting a call back to discuss the results. Please advise, thank you.

## 2022-12-22 NOTE — Telephone Encounter (Signed)
The pt is asking about diatherix results.  Result is negative for H pylori. The pt has been advised. She will f/u as needed.

## 2022-12-22 NOTE — Telephone Encounter (Signed)
  H pylori has been successfully treated. Please call the office to schedule a follow-up office appointment if you are still having abdominal pain or altered bowel habits despite successful treatment of the H pylori. Thanks. KLB

## 2023-01-10 ENCOUNTER — Emergency Department (HOSPITAL_BASED_OUTPATIENT_CLINIC_OR_DEPARTMENT_OTHER): Payer: 59 | Admitting: Radiology

## 2023-01-10 ENCOUNTER — Encounter (HOSPITAL_BASED_OUTPATIENT_CLINIC_OR_DEPARTMENT_OTHER): Payer: Self-pay | Admitting: Emergency Medicine

## 2023-01-10 ENCOUNTER — Emergency Department (HOSPITAL_BASED_OUTPATIENT_CLINIC_OR_DEPARTMENT_OTHER)
Admission: EM | Admit: 2023-01-10 | Discharge: 2023-01-10 | Disposition: A | Discharge status: Home / Self Care | Payer: 59 | Attending: Emergency Medicine | Admitting: Emergency Medicine

## 2023-01-10 ENCOUNTER — Other Ambulatory Visit (HOSPITAL_BASED_OUTPATIENT_CLINIC_OR_DEPARTMENT_OTHER): Payer: Self-pay

## 2023-01-10 ENCOUNTER — Other Ambulatory Visit: Payer: Self-pay

## 2023-01-10 DIAGNOSIS — W108XXA Fall (on) (from) other stairs and steps, initial encounter: Secondary | ICD-10-CM | POA: Diagnosis not present

## 2023-01-10 DIAGNOSIS — M25512 Pain in left shoulder: Secondary | ICD-10-CM | POA: Diagnosis present

## 2023-01-10 DIAGNOSIS — W19XXXA Unspecified fall, initial encounter: Secondary | ICD-10-CM

## 2023-01-10 MED ORDER — OXYCODONE-ACETAMINOPHEN 5-325 MG PO TABS: 1.0000 | ORAL_TABLET | Freq: Four times a day (QID) | ORAL | 0 refills | Status: DC | PRN

## 2023-01-10 MED ORDER — OXYCODONE-ACETAMINOPHEN 5-325 MG PO TABS
1.0000 | ORAL_TABLET | Freq: Once | ORAL | Status: AC
  Administered 2023-01-10: 1 via ORAL
  Filled 2023-01-10: qty 1

## 2023-01-10 NOTE — Discharge Instructions (Signed)
You were seen in the emergency department following mechanical fall.  Your x-ray imaging was negative for any acute fractures or dislocations.  I would advise managing her symptoms at home as best he can with over-the-counter pain medications.  Prescription for Percocet will also be sent to her pharmacy.  Please plan to follow-up with your orthopedic surgeon in the next few days as currently scheduled.

## 2023-01-10 NOTE — ED Provider Notes (Signed)
[place] EMERGENCY DEPARTMENT AT [facility] Provider Note   CSN: [id number] Arrival date & time: 01/10/23  1302     History Chief Complaint  Patient presents with   Fall    Jessica Davenport is a 57 y.o. female.  Patient presents to the emergency department complaints of mechanical fall.  She reports that she fell forwards upstairs yesterday.  Does endorse that she recently had rotator cuff surgery repair about 2 months ago and has had some complication of the healing process since then.  He was previously taking Percocet for pain but reports that she has been taking Tylenol and ibuprofen recently with adequate pain control.  Denies any significant changes in range of motion does report that she was having difficulty range of motion previously due to adhesive capsulitis.  Currently planning on following up with orthopedics this week.  Was operated on by Dr. Francena Hanly with [facility].   Fall       Home Medications Prior to Admission medications   Medication Sig Start Date End Date Taking? Authorizing Provider  albuterol (VENTOLIN HFA) 108 (90 Base) MCG/ACT inhaler Inhale 2 puffs into the lungs every 6 (six) hours as needed for wheezing or shortness of breath. 10/22/22   Pincus Sanes, MD  oxyCODONE-acetaminophen (PERCOCET/ROXICET) 5-325 MG tablet Take 1 tablet by mouth every 6 (six) hours as needed for severe pain. 01/10/23  Yes Smitty Knudsen, PA-C  acetaminophen (TYLENOL) 500 MG tablet Take 500-1,000 mg by mouth as needed.    [provider]  albuterol (VENTOLIN HFA) 108 (90 Base) MCG/ACT inhaler Inhale 1 puff into the lungs as needed. 10/18/22   Pincus Sanes, MD  amoxicillin-clavulanate (AUGMENTIN) 875-125 MG tablet Take 1 tablet by mouth 2 (two) times daily. 10/18/22   Pincus Sanes, MD  aspirin-acetaminophen-caffeine (EXCEDRIN MIGRAINE) [phone] MG tablet Take 1 tablet by mouth as needed. 10/09/20   [provider]  clonazePAM (KLONOPIN) 1  MG tablet Take 1 tablet (1 mg total) by mouth 2 (two) times daily as needed for anxiety. 01/08/22   Corwin Levins, MD  EPINEPHrine 0.3 mg/0.3 mL IJ SOAJ injection Inject 0.3 mg into the muscle as needed for anaphylaxis. 09/17/19   [provider]  ferrous sulfate 220 (44 Fe) MG/5ML solution Take 5 mLs (220 mg total) by mouth 2 (two) times daily with a meal. 07/16/22   [name], MD  HYDROcodone bit-homatropine (HYCODAN) 5-1.5 MG/5ML syrup Take 5 mLs by mouth every 8 (eight) hours as needed for cough. 10/18/22   Pincus Sanes, MD  meloxicam (MOBIC) 15 MG tablet Take 1 tablet daily with food for 5-7 days, then as needed    [provider]  methocarbamol (ROBAXIN) 500 MG tablet Take 1 tablet (500 mg total) by mouth every 8 (eight) hours as needed for muscle spasms. 05/28/22   Elenore Paddy, NP  olmesartan (BENICAR) 20 MG tablet TAKE 1 TABLET BY MOUTH DAILY 08/10/22   [name], MD  omeprazole (PRILOSEC) 40 MG capsule TAKE ONE CAPSULE BY MOUTH TWICE A DAY 11/05/22   Tressia Danas, MD  potassium chloride SA (KLOR-CON M) 20 MEQ tablet Take 2 tablets by mouth every AM and one tablet every PM 07/02/21   Corwin Levins, MD  traZODone (DESYREL) 50 MG tablet TAKE 1 TABLET BY MOUTH AT BEDTIME 08/10/22   [name], MD  triamterene-hydrochlorothiazide (DYAZIDE) 37.5-25 MG capsule TAKE 1 CAPSULE BY MOUTH DAILY 10/02/22   Sanda Linger  L, MD      Allergies    Grass pollen([id number]-o-[id number]-t-swt vern), Lisinopril, and Other    Review of Systems   Review of Systems  Musculoskeletal:        Left shoulder pain  All other systems reviewed and are negative.   Physical Exam Updated Vital Signs BP 127/68   Pulse 68   Temp 97.9 F (36.6 C)   Resp 17   Ht 5' 5.5" (1.664 m)   Wt (!) 145.2 kg   LMP 11/10/2011   SpO2 100%   BMI 52.44 kg/m  Physical Exam Vitals and nursing note reviewed.  Constitutional:      General: She is not in acute distress.    Appearance: She is well-developed.   HENT:     Head: Normocephalic and atraumatic.  Eyes:     Conjunctiva/sclera: Conjunctivae normal.  Abdominal:     Palpations: Abdomen is soft.     Tenderness: There is no abdominal tenderness.  Musculoskeletal:        General: Tenderness present. No swelling, deformity or signs of injury.     Right lower leg: No edema.     Left lower leg: No edema.     Comments: Limited range of motion in left shoulder compared to right.  Appears to be patient's new baseline following surgery.  Skin:    General: Skin is warm and dry.     Capillary Refill: Capillary refill takes less than 2 seconds.  Neurological:     General: No focal deficit present.     Mental Status: She is alert. Mental status is at baseline.     ED Results / Procedures / Treatments   Labs (all labs ordered are listed, but only abnormal results are displayed) Labs Reviewed - No data to display  EKG None  Radiology DG Hand Complete Left  Result Date: 01/10/2023 CLINICAL DATA:  Fall, left hand pain EXAM: LEFT HAND - COMPLETE 3+ VIEW COMPARISON:  None Available. FINDINGS: There is no evidence of fracture or dislocation. There is no evidence of arthropathy or other focal bone abnormality. Soft tissues are unremarkable. IMPRESSION: Negative. Electronically Signed   By: Duanne Guess D.O.   On: 01/10/2023 14:06   DG Elbow Complete Left  Result Date: 01/10/2023 CLINICAL DATA:  Fall, left elbow pain EXAM: LEFT ELBOW - COMPLETE 3+ VIEW COMPARISON:  None Available. FINDINGS: There is no evidence of fracture, dislocation, or joint effusion. There is no evidence of arthropathy or other focal bone abnormality. Soft tissues are unremarkable. IMPRESSION: Negative. Electronically Signed   By: Duanne Guess D.O.   On: 01/10/2023 14:04   DG Shoulder Left  Result Date: 01/10/2023 CLINICAL DATA:  Fall, left shoulder pain EXAM: LEFT SHOULDER - 2+ VIEW COMPARISON:  None Available. FINDINGS: There is no evidence of fracture or  dislocation. There is no evidence of arthropathy or other focal bone abnormality. Soft tissues are unremarkable. IMPRESSION: Negative. Electronically Signed   By: Duanne Guess D.O.   On: 01/10/2023 14:03    Procedures Procedures   Medications Ordered in ED Medications  oxyCODONE-acetaminophen (PERCOCET/ROXICET) 5-325 MG per tablet 1 tablet (has no administration in time range)    ED Course/ Medical Decision Making/ A&P                           Medical Decision Making Amount and/or Complexity of Data Reviewed Radiology: ordered.  Risk Prescription drug management.   This patient presents to  the ED for concern of fall.  Differential diagnosis includes shoulder dislocation, humeral neck fracture, cervical radiculopathy, hematoma of the left arm   Imaging Studies ordered:  I ordered imaging studies including x-ray of left shoulder, left elbow, left hand I independently visualized and interpreted imaging which showed no acute fracture dislocations noted I agree with the radiologist interpretation   Medicines ordered and prescription drug management:  I ordered medication including Percocet for pain Reevaluation of the patient after these medicines showed that the patient improved I have reviewed the patients home medicines and have made adjustments as needed   Problem List / ED Course:  Patient presented to the emergency department following a mechanical fall.  She reports that she lost her footing when she was walking up some stairs at her apartment.  She reports that she fell forward with arms outstretched and denies direct impact to the left shoulder.  Recently denies shoulder surgery on this left side about 2 months ago.  States that pain is difficult to manage at home with over-the-counter pain medications at this time.  Was previously taking Percocet for pain control but states that she has been out of this medication several weeks ago.  On examination, patient is range  of motion is limited in left shoulder but patient appears at this is not significantly different compared to prior to fall.  Given that patient is planning on following up with orthopedics this week, I advised pain control and following up with them for further recommendations regarding imaging or further testing.  Patient is neurovascularly intact and range of motion is still present the patient would likely benefit from further imaging to reassess potential injuries at this area unless orthopedics does not believe that this is necessary.  Advised patient to follow-up with primary care provider or return to the emergency department if symptoms are worsening.  Will plan on discharging him after dose of Percocet. Percocet administered.  Patient discharged home in good condition.  All questions answered prior to patient discharge.  Final Clinical Impression(s) / ED Diagnoses Final diagnoses:  Acute pain of left shoulder  Fall, initial encounter    Rx / DC Orders ED Discharge Orders          Ordered    oxyCODONE-acetaminophen (PERCOCET/ROXICET) 5-325 MG tablet  Every 6 hours PRN        01/10/23 1640              Smitty Knudsen, PA-C 01/10/23 1641    Gwyneth Sprout, MD 01/14/23 1745

## 2023-01-10 NOTE — ED Triage Notes (Signed)
Pt arrives to ED with c/o fall. Pt notes mechanical fall at her home last night. She notes left hand pain that runs up to her left shoulder. Pt denies head or neck injury.

## 2023-01-11 ENCOUNTER — Telehealth: Payer: Self-pay

## 2023-01-11 NOTE — Transitions of Care (Post Inpatient/ED Visit) (Unsigned)
   01/11/2023  Name: Jessica Davenport MRN: [id number] DOB: March 31, 1966  Today's TOC FU Call Status: Today's TOC FU Call Status:: Unsuccessul Call (1st Attempt) Unsuccessful Call (1st Attempt) Date: 01/11/23  Attempted to reach the patient regarding the most recent Inpatient/ED visit.  Follow Up Plan: Additional outreach attempts will be made to reach the patient to complete the Transitions of Care (Post Inpatient/ED visit) call.   Signature Karena Addison, LPN [facility] Nurse Health Advisor Direct Dial [phone]

## 2023-01-13 NOTE — Transitions of Care (Post Inpatient/ED Visit) (Signed)
01/13/2023  Name: Jessica Davenport MRN: [id number] DOB: 08-Nov-1965  Today's TOC FU Call Status: Today's TOC FU Call Status:: Successful TOC FU Call Competed Unsuccessful Call (1st Attempt) Date: 01/11/23 [facility] FU Call Complete Date: 01/13/23  Transition Care Management Follow-up Telephone Call Date of Discharge: 01/10/23 Discharge Facility: [facility] ([facility]) Type of Discharge: Emergency Department Reason for ED Visit: Other: (left shoulder pain) How have you been since you were released from the hospital?: Better Any questions or concerns?: No  Items Reviewed: Did you receive and understand the discharge instructions provided?: Yes Medications obtained,verified, and reconciled?: Yes (Medications Reviewed) Any new allergies since your discharge?: No Dietary orders reviewed?: Yes Do you have support at home?: Yes People in Home: child(ren), adult  Medications Reviewed Today: Medications Reviewed Today     Reviewed by Karena Addison, LPN (Licensed Practical Nurse) on 01/13/23 at 1353  Med List Status: <None>   Medication Order Taking? Sig Documenting Provider Last Dose Status Informant  acetaminophen (TYLENOL) 500 MG tablet [id number] No Take 500-1,000 mg by mouth as needed. [provider] Taking Active   albuterol (VENTOLIN HFA) 108 (90 Base) MCG/ACT inhaler [id number]  Inhale 1 puff into the lungs as needed. Pincus Sanes, MD  Active   albuterol (VENTOLIN HFA) 108 (90 Base) MCG/ACT inhaler [id number]  Inhale 2 puffs into the lungs every 6 (six) hours as needed for wheezing or shortness of breath. Pincus Sanes, MD  Active   amoxicillin-clavulanate (AUGMENTIN) 875-125 MG tablet [id number]  Take 1 tablet by mouth 2 (two) times daily. Pincus Sanes, MD  Active   aspirin-acetaminophen-caffeine [facility] MIGRAINE) [phone] MG tablet [id number] No Take 1 tablet by mouth as needed. [provider] Taking Active   clonazePAM (KLONOPIN) 1 MG tablet [id number]  No Take 1 tablet (1 mg total) by mouth 2 (two) times daily as needed for anxiety. Corwin Levins, MD Taking Active   EPINEPHrine 0.3 mg/0.3 mL IJ SOAJ injection [id number] No Inject 0.3 mg into the muscle as needed for anaphylaxis. [provider] Taking Active Self           Med Note (DAVIS, SOPHIA A   Thu Sep 23, 2022  9:30 AM) On hand  ferrous sulfate 220 (44 Fe) MG/5ML solution [id number] No Take 5 mLs (220 mg total) by mouth 2 (two) times daily with a meal. [name], MD Taking Active   HYDROcodone bit-homatropine (HYCODAN) 5-1.5 MG/5ML syrup [id number]  Take 5 mLs by mouth every 8 (eight) hours as needed for cough. Pincus Sanes, MD  Active   meloxicam [facility]) 15 MG tablet [id number] No Take 1 tablet daily with food for 5-7 days, then as needed [provider] Taking Active   methocarbamol (ROBAXIN) 500 MG tablet [id number] No Take 1 tablet (500 mg total) by mouth every 8 (eight) hours as needed for muscle spasms. Elenore Paddy, NP Taking Active   olmesartan [facility]) 20 MG tablet [id number] No TAKE 1 TABLET BY MOUTH DAILY [name], MD Taking Active   omeprazole (PRILOSEC) 40 MG capsule [id number]  TAKE ONE CAPSULE BY MOUTH TWICE A Jonette Eva, MD  Active   oxyCODONE-acetaminophen (PERCOCET/ROXICET) 5-325 MG tablet [id number]  Take 1 tablet by mouth every 6 (six) hours as needed for severe pain. Smitty Knudsen, PA-C  Active   potassium chloride SA (KLOR-CON M) 20 MEQ tablet [id number] No Take 2 tablets by mouth every AM and one tablet every PM Corwin Levins, MD Taking Active  traZODone (DESYREL) 50 MG tablet [id number] No TAKE 1 TABLET BY MOUTH AT BEDTIME [name], MD Taking Active   triamterene-hydrochlorothiazide (DYAZIDE) 37.5-25 MG capsule [id number] No TAKE 1 CAPSULE BY MOUTH DAILY [name], MD Taking Active             Home Care and Equipment/Supplies: Were Home Health Services Ordered?: NA Any new equipment or medical supplies ordered?:  NA  Functional Questionnaire: Do you need assistance with bathing/showering or dressing?: No Do you need assistance with meal preparation?: No Do you need assistance with eating?: No Do you have difficulty maintaining continence: No Do you need assistance with getting out of bed/getting out of a chair/moving?: No Do you have difficulty managing or taking your medications?: No  Follow up appointments reviewed: PCP Follow-up appointment confirmed?: No (no avail appt times, sent message to staff to schedule) MD Provider Line Number:[phone] Given: No Specialist Hospital Follow-up appointment confirmed?: NA Do you need transportation to your follow-up appointment?: No Do you understand care options if your condition(s) worsen?: Yes-patient verbalized understanding    SIGNATURE Karena Addison, LPN [facility] Nurse Health Advisor Direct Dial [phone]

## 2023-01-14 ENCOUNTER — Other Ambulatory Visit: Payer: Self-pay | Admitting: Internal Medicine

## 2023-01-14 DIAGNOSIS — F411 Generalized anxiety disorder: Secondary | ICD-10-CM

## 2023-01-18 ENCOUNTER — Encounter: Payer: Self-pay | Admitting: Internal Medicine

## 2023-01-18 ENCOUNTER — Ambulatory Visit: Payer: 59 | Admitting: Internal Medicine

## 2023-01-18 ENCOUNTER — Ambulatory Visit (INDEPENDENT_AMBULATORY_CARE_PROVIDER_SITE_OTHER): Payer: 59

## 2023-01-18 VITALS — BP 122/76 | HR 77 | Temp 98.7°F | Ht 65.5 in | Wt 320.0 lb

## 2023-01-18 DIAGNOSIS — M25521 Pain in right elbow: Secondary | ICD-10-CM | POA: Diagnosis not present

## 2023-01-18 DIAGNOSIS — M25461 Effusion, right knee: Secondary | ICD-10-CM

## 2023-01-18 DIAGNOSIS — M25462 Effusion, left knee: Secondary | ICD-10-CM | POA: Diagnosis not present

## 2023-01-18 DIAGNOSIS — W19XXXD Unspecified fall, subsequent encounter: Secondary | ICD-10-CM

## 2023-01-18 DIAGNOSIS — F411 Generalized anxiety disorder: Secondary | ICD-10-CM | POA: Diagnosis not present

## 2023-01-18 DIAGNOSIS — D509 Iron deficiency anemia, unspecified: Secondary | ICD-10-CM

## 2023-01-18 DIAGNOSIS — M25511 Pain in right shoulder: Secondary | ICD-10-CM | POA: Diagnosis not present

## 2023-01-18 DIAGNOSIS — M545 Low back pain, unspecified: Secondary | ICD-10-CM | POA: Diagnosis not present

## 2023-01-18 MED ORDER — CYCLOBENZAPRINE HCL 10 MG PO TABS: ORAL_TABLET | ORAL | 1 refills | Status: DC

## 2023-01-18 MED ORDER — TRAMADOL HCL 50 MG PO TABS: 50.0000 mg | ORAL_TABLET | Freq: Four times a day (QID) | ORAL | 0 refills | Status: DC | PRN

## 2023-01-18 MED ORDER — CLONAZEPAM 1 MG PO TABS: 1.0000 mg | ORAL_TABLET | Freq: Two times a day (BID) | ORAL | 1 refills | Status: DC | PRN

## 2023-01-18 MED ORDER — FERROUS SULFATE 220 (44 FE) MG/5ML PO SOLN: 220.0000 mg | Freq: Two times a day (BID) | ORAL | 5 refills | Status: DC

## 2023-01-18 NOTE — Patient Instructions (Signed)
Please take all new medication as prescribed - the tramadol and flexeril for pain  Please continue all other medications as before, and refills have been done if requested - the iron, and klonopin  Please have the pharmacy call with any other refills you may need.  Please continue your efforts at being more active, low cholesterol diet, and weight control.  Please keep your appointments with your specialists as you may have planned  Please go to the XRAY Department in the first floor for the x-ray testing  You will be contacted by phone if any changes need to be made immediately.  Otherwise, you will receive a letter about your results with an explanation, but please check with MyChart first.  Please remember to sign up for MyChart if you have not done so, as this will be important to you in the future with finding out test results, communicating by private email, and scheduling acute appointments online when needed.

## 2023-01-18 NOTE — Progress Notes (Unsigned)
Patient ID: Jessica Davenport, female   DOB: 1965-10-20, 57 y.o.   MRN: [id number]        Chief Complaint: follow up polyarthralgias post fall       HPI:  Jessica Davenport is a 57 y.o. female here with c/o hx of left rot cuff surgury nov 2023, but unfortunately fell again jun 23, happened very fast but believes she tripped and fell to both knees, also with lower back pain, right shoulder and elbow pain as well.  Lower back likely the worst of it now 8/10.  Was seen June 24 in ED, then 6/27 per [facility], but pain persists and has no planned follow up.  Pt denies chest pain, increased sob or doe, wheezing, orthopnea, PND, increased LE swelling, palpitations, dizziness or syncope.   Pt denies polydipsia, polyuria, or new focal neuro s/s.    Pt denies fever, wt loss, night sweats, loss of appetite, or other constitutional symptoms  Denies worsening depressive symptoms, suicidal ideation, but has significant anxiety, obesity now worsening ambulattion with pain, for refill klonopin prn requested.  No overt bleeding, but incidentally out of iron solution, asks to restart as this was intended per pt and will f/u with pcp.         Wt Readings from Last 3 Encounters:  01/18/23 (!) 320 lb (145.2 kg)  01/10/23 (!) 320 lb (145.2 kg)  10/18/22 (!) 315 lb 3.2 oz (143 kg)   BP Readings from Last 3 Encounters:  01/18/23 122/76  01/10/23 127/71  10/18/22 126/80         Past Medical History:  Diagnosis Date   Abscess    Allergy    Bilateral lower extremity edema 12/02/2016   Essential hypertension    Fibroid, uterine    Infected sebaceous cyst    mons pubis   Iron deficiency anemia 08/19/2009        Lower extremity edema    Lumbar disc disease    Morbid obesity (HCC)    Obesity, Class III, BMI 40-49.9 (morbid obesity) (HCC) 02/15/2012   Ostium secundum type atrial septal defect    s/p 28 mm Amplatzer device closure in 2005   Pulmonary HTN (HCC) 08/20/2009        Pure hypercholesterolemia 06/16/2012    Small bowel obstruction (HCC) 2013   Vitamin D deficiency    Past Surgical History:  Procedure Laterality Date   ABDOMINAL HYSTERECTOMY  12/07/2011   ASD REPAIR  2005/2009   ENDOMETRIAL ABLATION  2010,2011,2012   teeth removal     TUBAL LIGATION  07/19/1994   uterine ablation      reports that she has never smoked. She has never used smokeless tobacco. She reports that she does not drink alcohol and does not use drugs. family history includes Anxiety disorder in her sister; Breast cancer in her maternal aunt and mother; Cervical cancer in her maternal aunt; Colon cancer in her maternal uncle; Coronary artery disease in her father and mother; Diabetes in her father and mother; Hypertension in her father and mother; Ovarian cancer in her maternal aunt. Allergies  Allergen Reactions   Grass Pollen(K-O-R-T-Swt Vern) Hives   Lisinopril Hives   Other     Celery and green peas   Current Outpatient Medications on File Prior to Visit  Medication Sig Dispense Refill   acetaminophen (TYLENOL) 500 MG tablet Take 500-1,000 mg by mouth as needed.     albuterol (VENTOLIN HFA) 108 (90 Base) MCG/ACT inhaler Inhale 1 puff into the  lungs as needed. 18 g 5   albuterol (VENTOLIN HFA) 108 (90 Base) MCG/ACT inhaler Inhale 2 puffs into the lungs every 6 (six) hours as needed for wheezing or shortness of breath. 8 g 2   EPINEPHrine 0.3 mg/0.3 mL IJ SOAJ injection Inject 0.3 mg into the muscle as needed for anaphylaxis.     furosemide (LASIX) 40 MG tablet Take 40 mg by mouth.     meloxicam (MOBIC) 15 MG tablet Take 1 tablet daily with food for 5-7 days, then as needed     methocarbamol (ROBAXIN) 500 MG tablet Take 1 tablet (500 mg total) by mouth every 8 (eight) hours as needed for muscle spasms. 10 tablet 1   omeprazole (PRILOSEC) 40 MG capsule TAKE ONE CAPSULE BY MOUTH TWICE A DAY 60 capsule 11   ondansetron (ZOFRAN) 4 MG tablet      potassium chloride SA (KLOR-CON M) 20 MEQ tablet Take 2 tablets by mouth  every AM and one tablet every PM 270 tablet 3   traZODone (DESYREL) 50 MG tablet TAKE 1 TABLET BY MOUTH AT BEDTIME 90 tablet 1   triamterene-hydrochlorothiazide (DYAZIDE) 37.5-25 MG capsule TAKE 1 CAPSULE BY MOUTH DAILY 90 capsule 0   No current facility-administered medications on file prior to visit.        ROS:  All others reviewed and negative.  Objective        PE:  BP 122/76 (BP Location: Right Arm, Patient Position: Sitting, Cuff Size: Normal)   Pulse 77   Temp 98.7 F (37.1 C) (Oral)   Ht 5' 5.5" (1.664 m)   Wt (!) 320 lb (145.2 kg)   LMP 11/10/2011   SpO2 99%   BMI 52.44 kg/m                 Constitutional: Pt appears in NAD               HENT: Head: NCAT.                Right Ear: External ear normal.                 Left Ear: External ear normal.                Eyes: . Pupils are equal, round, and reactive to light. Conjunctivae and EOM are normal               Nose: without d/c or deformity               Neck: Neck supple. Gross normal ROM               Cardiovascular: Normal rate and regular rhythm.                 Pulmonary/Chest: Effort normal and breath sounds without rales or wheezing.                Abd:  Soft, NT, ND, + BS, no organomegaly               Neurological: Pt is alert. At baseline orientation, motor grossly intact               Skin: Skin is warm. No rashes, no other new lesions, LE edema - none               Bilat knees with small effusions abrasions right > left, has mild tener right elbow lateral epicondylar area, has tenderness at the  right deltoid insertion site at the humerus, also with diffuse lumbar midline tender, worse at the lower levels, without LE weakness or radicular pain               Psychiatric: Pt behavior is normal without agitation   Micro: none  Cardiac tracings I have personally interpreted today:  none  Pertinent Radiological findings (summarize): none   Lab Results  Component Value Date   WBC 7.6 09/23/2022   HGB 11.9  (L) 09/23/2022   HCT 36.5 09/23/2022   PLT 347.0 09/23/2022   GLUCOSE 106 (H) 09/23/2022   CHOL 229 (H) 07/02/2021   TRIG 90.0 07/02/2021   HDL 61.30 07/02/2021   LDLDIRECT 148.1 06/16/2012   LDLCALC 150 (H) 07/02/2021   ALT 12 09/23/2022   AST 12 09/23/2022   NA 139 09/23/2022   K 3.8 09/23/2022   CL 101 09/23/2022   CREATININE 0.82 09/23/2022   BUN 11 09/23/2022   CO2 30 09/23/2022   TSH 1.66 07/08/2022   HGBA1C 6.4 07/08/2022   Assessment/Plan:  Jessica Davenport is a 57 y.o. Black or African American [2] Other or two or more races [6] female with  has a past medical history of Abscess, Allergy, Bilateral lower extremity edema (12/02/2016), Essential hypertension, Fibroid, uterine, Infected sebaceous cyst, Iron deficiency anemia (08/19/2009), Lower extremity edema, Lumbar disc disease, Morbid obesity (HCC), Obesity, Class III, BMI 40-49.9 (morbid obesity) (HCC) (02/15/2012), Ostium secundum type atrial septal defect, Pulmonary HTN (HCC) (08/20/2009), Pure hypercholesterolemia (06/16/2012), Small bowel obstruction (HCC) (2013), and Vitamin D deficiency.  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  Oliver Barre, MD 01/20/2023 3:38 PM [facility] Internal Medicine

## 2023-01-20 ENCOUNTER — Encounter: Payer: Self-pay | Admitting: Internal Medicine

## 2023-01-20 DIAGNOSIS — W19XXXA Unspecified fall, initial encounter: Secondary | ICD-10-CM | POA: Insufficient documentation

## 2023-01-20 NOTE — Assessment & Plan Note (Signed)
Ok for restart iron solution and pt states will f/u with pcp

## 2023-01-20 NOTE — Assessment & Plan Note (Signed)
Chronic persistent with mild situational worsening, for klonopin refill prn,

## 2023-01-20 NOTE — Assessment & Plan Note (Signed)
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

## 2023-01-24 ENCOUNTER — Telehealth: Payer: Self-pay | Admitting: Internal Medicine

## 2023-01-24 DIAGNOSIS — M7541 Impingement syndrome of right shoulder: Secondary | ICD-10-CM | POA: Insufficient documentation

## 2023-01-24 NOTE — Telephone Encounter (Signed)
Pt called wanting results on her xray's. Please advise.

## 2023-01-26 NOTE — Telephone Encounter (Signed)
Patient called back to check on the status of her imaging results. She would like a call back at [phone].

## 2023-01-27 ENCOUNTER — Encounter: Payer: Self-pay | Admitting: Internal Medicine

## 2023-01-27 NOTE — Telephone Encounter (Signed)
Very sorry, I ma not sure how the result comments were not put into the [place] - fortunately there was only seen mild arthritis in the knees, but no fractures or other significant abnormalities of the knee xrays, the right elbow or the lower back xray   Thanks

## 2023-01-27 NOTE — Telephone Encounter (Signed)
Notified pt w/ MD response. She states he did put a msg on phone tree, but she will f/u w/ her orthopedic.Marland KitchenSantiago Bumpers

## 2023-02-04 ENCOUNTER — Other Ambulatory Visit: Payer: Self-pay | Admitting: Internal Medicine

## 2023-02-04 DIAGNOSIS — I10 Essential (primary) hypertension: Secondary | ICD-10-CM

## 2023-03-03 ENCOUNTER — Encounter (INDEPENDENT_AMBULATORY_CARE_PROVIDER_SITE_OTHER): Payer: Self-pay

## 2023-03-09 ENCOUNTER — Other Ambulatory Visit: Payer: Self-pay | Admitting: Internal Medicine

## 2023-03-09 DIAGNOSIS — I10 Essential (primary) hypertension: Secondary | ICD-10-CM

## 2023-03-16 ENCOUNTER — Ambulatory Visit: Payer: 59 | Admitting: Internal Medicine

## 2023-03-16 ENCOUNTER — Encounter: Payer: Self-pay | Admitting: Internal Medicine

## 2023-03-16 VITALS — BP 124/82 | HR 90 | Temp 101.3°F | Ht 65.5 in | Wt 316.0 lb

## 2023-03-16 DIAGNOSIS — I10 Essential (primary) hypertension: Secondary | ICD-10-CM | POA: Diagnosis not present

## 2023-03-16 DIAGNOSIS — E559 Vitamin D deficiency, unspecified: Secondary | ICD-10-CM

## 2023-03-16 DIAGNOSIS — R7303 Prediabetes: Secondary | ICD-10-CM | POA: Diagnosis not present

## 2023-03-16 DIAGNOSIS — U071 COVID-19: Secondary | ICD-10-CM | POA: Insufficient documentation

## 2023-03-16 LAB — POC COVID19 BINAXNOW: SARS Coronavirus 2 Ag: POSITIVE — AB

## 2023-03-16 MED ORDER — NIRMATRELVIR/RITONAVIR (PAXLOVID)TABLET: 3.0000 | ORAL_TABLET | Freq: Two times a day (BID) | ORAL | 0 refills | Status: AC

## 2023-03-16 MED ORDER — HYDROCODONE BIT-HOMATROP MBR 5-1.5 MG/5ML PO SOLN: 5.0000 mL | Freq: Four times a day (QID) | ORAL | 0 refills | Status: AC | PRN

## 2023-03-16 NOTE — Progress Notes (Unsigned)
Patient ID: Jessica Davenport, female   DOB: 06-25-1966, 57 y.o.   MRN: [id number]        Chief Complaint: follow up URI symptoms, low vit d, preDM, htn       HPI:  Jessica Davenport is a 57 y.o. female here with c/o uri symptoms x 3 days  -  Here with 2-3 days acute onset fever, facial pain, pressure, headache, general weakness and malaise, with mild ST and cough, but pt denies chest pain, wheezing, increased sob or doe, orthopnea, PND, increased LE swelling, palpitations, dizziness or syncope.  Temp 101.3 today.   Pt denies polydipsia, polyuria, or new focal neuro s/s.    Pt denies recent unintended wt loss, night sweats,, or other constitutional symptoms except for some  loss of appetite.        Wt Readings from Last 3 Encounters:  03/16/23 (!) 316 lb (143.3 kg)  01/18/23 (!) 320 lb (145.2 kg)  01/10/23 (!) 320 lb (145.2 kg)   BP Readings from Last 3 Encounters:  03/16/23 124/82  01/18/23 122/76  01/10/23 127/71         Past Medical History:  Diagnosis Date   Abscess    Allergy    Bilateral lower extremity edema 12/02/2016   Essential hypertension    Fibroid, uterine    Infected sebaceous cyst    mons pubis   Iron deficiency anemia 08/19/2009        Lower extremity edema    Lumbar disc disease    Morbid obesity (HCC)    Obesity, Class III, BMI 40-49.9 (morbid obesity) (HCC) 02/15/2012   Ostium secundum type atrial septal defect    s/p 28 mm Amplatzer device closure in 2005   Pulmonary HTN (HCC) 08/20/2009        Pure hypercholesterolemia 06/16/2012   Small bowel obstruction (HCC) 2013   Vitamin D deficiency    Past Surgical History:  Procedure Laterality Date   ABDOMINAL HYSTERECTOMY  12/07/2011   ASD REPAIR  2005/2009   ENDOMETRIAL ABLATION  2010,2011,2012   teeth removal     TUBAL LIGATION  07/19/1994   uterine ablation      reports that she has never smoked. She has never used smokeless tobacco. She reports that she does not drink alcohol and does not use  drugs. family history includes Anxiety disorder in her sister; Breast cancer in her maternal aunt and mother; Cervical cancer in her maternal aunt; Colon cancer in her maternal uncle; Coronary artery disease in her father and mother; Diabetes in her father and mother; Hypertension in her father and mother; Ovarian cancer in her maternal aunt. Allergies  Allergen Reactions   Grass Pollen(K-O-R-T-Swt Vern) Hives   Lisinopril Hives   Other     Celery and green peas   Current Outpatient Medications on File Prior to Visit  Medication Sig Dispense Refill   acetaminophen (TYLENOL) 500 MG tablet Take 500-1,000 mg by mouth as needed.     albuterol (VENTOLIN HFA) 108 (90 Base) MCG/ACT inhaler Inhale 1 puff into the lungs as needed. 18 g 5   albuterol (VENTOLIN HFA) 108 (90 Base) MCG/ACT inhaler Inhale 2 puffs into the lungs every 6 (six) hours as needed for wheezing or shortness of breath. 8 g 2   clonazePAM (KLONOPIN) 1 MG tablet Take 1 tablet (1 mg total) by mouth 2 (two) times daily as needed for anxiety. 60 tablet 1   cyclobenzaprine (FLEXERIL) 10 MG tablet 1 tab by mouth every 8  hrs as needed 40 tablet 1   EPINEPHrine 0.3 mg/0.3 mL IJ SOAJ injection Inject 0.3 mg into the muscle as needed for anaphylaxis.     ferrous sulfate 220 (44 Fe) MG/5ML solution Take 5 mLs (220 mg total) by mouth 2 (two) times daily with a meal. 300 mL 5   furosemide (LASIX) 40 MG tablet Take 40 mg by mouth.     meloxicam (MOBIC) 15 MG tablet Take 1 tablet daily with food for 5-7 days, then as needed     methocarbamol (ROBAXIN) 500 MG tablet Take 1 tablet (500 mg total) by mouth every 8 (eight) hours as needed for muscle spasms. 10 tablet 1   omeprazole (PRILOSEC) 40 MG capsule TAKE ONE CAPSULE BY MOUTH TWICE A DAY 60 capsule 11   ondansetron (ZOFRAN) 4 MG tablet      potassium chloride SA (KLOR-CON M) 20 MEQ tablet Take 2 tablets by mouth every AM and one tablet every PM 270 tablet 3   traMADol (ULTRAM) 50 MG tablet Take 1  tablet (50 mg total) by mouth every 6 (six) hours as needed. 60 tablet 0   traZODone (DESYREL) 50 MG tablet TAKE 1 TABLET BY MOUTH AT BEDTIME 90 tablet 1   triamterene-hydrochlorothiazide (DYAZIDE) 37.5-25 MG capsule TAKE 1 CAPSULE BY MOUTH DAILY 90 capsule 0   No current facility-administered medications on file prior to visit.        ROS:  All others reviewed and negative.  Objective        PE:  BP 124/82 (BP Location: Left Arm, Patient Position: Sitting, Cuff Size: Normal)   Pulse 90   Temp (!) 101.3 F (38.5 C) (Oral)   Ht 5' 5.5" (1.664 m)   Wt (!) 316 lb (143.3 kg)   LMP 11/10/2011   SpO2 97%   BMI 51.79 kg/m                 Constitutional: Pt appears in NAD               HENT: Head: NCAT.                Right Ear: External ear normal.                 Left Ear: External ear normal. Bilat tm's with mild erythema.  Max sinus areas mild tender.  Pharynx with mild erythema, no exudate               Eyes: . Pupils are equal, round, and reactive to light. Conjunctivae and EOM are normal               Nose: without d/c or deformity               Neck: Neck supple. Gross normal ROM               Cardiovascular: Normal rate and regular rhythm.                 Pulmonary/Chest: Effort normal and breath sounds without rales or wheezing.                Abd:  Soft, NT, ND, + BS, no organomegaly               Neurological: Pt is alert. At baseline orientation, motor grossly intact               Skin: Skin is warm. No rashes, no other new lesions, LE  edema - none               Psychiatric: Pt behavior is normal without agitation   Micro: none  Cardiac tracings I have personally interpreted today:  none  Pertinent Radiological findings (summarize): none   Lab Results  Component Value Date   WBC 7.6 09/23/2022   HGB 11.9 (L) 09/23/2022   HCT 36.5 09/23/2022   PLT 347.0 09/23/2022   GLUCOSE 106 (H) 09/23/2022   CHOL 229 (H) 07/02/2021   TRIG 90.0 07/02/2021   HDL 61.30 07/02/2021    LDLDIRECT 148.1 06/16/2012   LDLCALC 150 (H) 07/02/2021   ALT 12 09/23/2022   AST 12 09/23/2022   NA 139 09/23/2022   K 3.8 09/23/2022   CL 101 09/23/2022   CREATININE 0.82 09/23/2022   BUN 11 09/23/2022   CO2 30 09/23/2022   TSH 1.66 07/08/2022   HGBA1C 6.4 07/08/2022   SARS Coronavirus 2 Ag Negative Positive Abnormal    Assessment/Plan:  Jessica Davenport is a 57 y.o. Black or African American [2] Other or two or more races [6] female with  has a past medical history of Abscess, Allergy, Bilateral lower extremity edema (12/02/2016), Essential hypertension, Fibroid, uterine, Infected sebaceous cyst, Iron deficiency anemia (08/19/2009), Lower extremity edema, Lumbar disc disease, Morbid obesity (HCC), Obesity, Class III, BMI 40-49.9 (morbid obesity) (HCC) (02/15/2012), Ostium secundum type atrial septal defect, Pulmonary HTN (HCC) (08/20/2009), Pure hypercholesterolemia (06/16/2012), Small bowel obstruction (HCC) (2013), and Vitamin D deficiency.  COVID-19 virus infection Mild to mod, for paxlovid course and cough med prn,,  to f/u any worsening symptoms or concerns  Vitamin D deficiency Last vitamin D Lab Results  Component Value Date   VD25OH 9.19 (L) 07/02/2021   Low to start oral replacement   Accelerated hypertension BP Readings from Last 3 Encounters:  03/16/23 124/82  01/18/23 122/76  01/10/23 127/71   Stable, pt to continue medical treatment dyaziide 37.5 - 25 qd   Prediabetes Lab Results  Component Value Date   HGBA1C 6.4 07/08/2022   Stable, pt to continue current medical treatment  - diet, wt control  Followup: Return if symptoms worsen or fail to improve.  Oliver Barre, MD 03/17/2023 9:22 PM [facility] Internal Medicine

## 2023-03-16 NOTE — Patient Instructions (Addendum)
Your COVID testing was positive today  Please take all new medication as prescribed - the paxlovid, and cough medicine  Please continue all other medications as before.  Please have the pharmacy call with any other refills you may need.  Please keep your appointments with your specialists as you may have planned

## 2023-03-17 ENCOUNTER — Telehealth: Payer: Self-pay | Admitting: Internal Medicine

## 2023-03-17 ENCOUNTER — Encounter: Payer: Self-pay | Admitting: Internal Medicine

## 2023-03-17 NOTE — Assessment & Plan Note (Signed)
Mild to mod, for paxlovid course and cough med prn,,  to f/u any worsening symptoms or concerns

## 2023-03-17 NOTE — Telephone Encounter (Signed)
Patient saw Dr. Jonny Ruiz on 03/16/2023 and was diagnosed with Covid - she needs a doctors note - please contact her at [phone]

## 2023-03-17 NOTE — Assessment & Plan Note (Addendum)
BP Readings from Last 3 Encounters:  03/16/23 124/82  01/18/23 122/76  01/10/23 127/71   Stable, pt to continue medical treatment dyaziide 37.5 - 25 qd

## 2023-03-17 NOTE — Assessment & Plan Note (Signed)
Lab Results  Component Value Date   HGBA1C 6.4 07/08/2022   Stable, pt to continue current medical treatment  - diet, wt control

## 2023-03-17 NOTE — Assessment & Plan Note (Signed)
Last vitamin D Lab Results  Component Value Date   VD25OH 9.19 (L) 07/02/2021   Low to start oral replacement

## 2023-03-18 ENCOUNTER — Encounter: Payer: Self-pay | Admitting: Internal Medicine

## 2023-03-18 NOTE — Telephone Encounter (Signed)
Work note done to [employer].

## 2023-03-18 NOTE — Telephone Encounter (Signed)
Ok for doctors note as requested per pt

## 2023-03-29 LAB — HM MAMMOGRAPHY: HM Mammogram: NORMAL (ref 0–4)

## 2023-03-30 ENCOUNTER — Emergency Department (HOSPITAL_BASED_OUTPATIENT_CLINIC_OR_DEPARTMENT_OTHER): Payer: 59 | Admitting: Radiology

## 2023-03-30 ENCOUNTER — Emergency Department (HOSPITAL_BASED_OUTPATIENT_CLINIC_OR_DEPARTMENT_OTHER)
Admission: EM | Admit: 2023-03-30 | Discharge: 2023-03-30 | Disposition: A | Discharge status: Home / Self Care | Payer: 59 | Attending: Emergency Medicine | Admitting: Emergency Medicine

## 2023-03-30 ENCOUNTER — Other Ambulatory Visit: Payer: Self-pay

## 2023-03-30 DIAGNOSIS — M25512 Pain in left shoulder: Secondary | ICD-10-CM | POA: Diagnosis present

## 2023-03-30 DIAGNOSIS — I10 Essential (primary) hypertension: Secondary | ICD-10-CM | POA: Insufficient documentation

## 2023-03-30 DIAGNOSIS — Z79899 Other long term (current) drug therapy: Secondary | ICD-10-CM | POA: Diagnosis not present

## 2023-03-30 MED ORDER — KETOROLAC TROMETHAMINE 15 MG/ML IJ SOLN
15.0000 mg | Freq: Once | INTRAMUSCULAR | Status: AC
  Administered 2023-03-30: 15 mg via INTRAMUSCULAR
  Filled 2023-03-30: qty 1

## 2023-03-30 NOTE — ED Notes (Signed)
Pt discharged to home using teachback Method. Discharge instructions have been discussed with patient and/or family members. Pt verbally acknowledges understanding d/c instructions, has been given opportunity for questions to be answered, and endorses comprehension to checkout at registration before leaving.  

## 2023-03-30 NOTE — Discharge Instructions (Signed)
As we discussed, your workup in the ER today was reassuring for acute findings.  X-ray imaging of your shoulder did not reveal any fracture or dislocation.  However, he would not pick up a ligament or tendon injury.  I do have some suspicion for this and therefore recommend that you follow-up closely with your orthopedist for continued evaluation and management of your symptoms.  Have given you a shoulder sling to wear for support and recommend you rest, ice, compress, and elevate it and call your orthopedist to schedule an appointment at your earliest convenience.  You can take Tylenol/ibuprofen for pain as well as your already prescribed home the pain medication.  Return if development of any new or worsening symptoms.

## 2023-03-30 NOTE — ED Provider Notes (Signed)
[place] EMERGENCY DEPARTMENT AT [facility] Provider Note   CSN: [id number] Arrival date & time: 03/30/23  1422     History  No chief complaint on file.   Jessica Davenport is a 57 y.o. female.  Patient with history of morbid obesity and hypertension presents today with complaints of left shoulder pain. She states that in April she had right rotator cuff repair which she has been recovering from appropriately.  At the end of August she was in a store picking up some prescriptions and someone in front of her suddenly turned around and bumped into her shoulder causing significant pain.  She states that since then she has had significant limitations to her range of motion in the shoulder along with substantial pain.  She has an appointment with her orthopedist at the end of the month for follow-up and originally thought she can make it to this appointment to discuss her new shoulder pain, however she has not been able to.  She is taking tramadol at night with some improvement.  Also states she has appointment with her primary doctor tomorrow.  She denies any other injuries or complaints. No chest pain or shortness of breath.  The history is provided by the patient. No language interpreter was used.       Home Medications Prior to Admission medications   Medication Sig Start Date End Date Taking? Authorizing Provider  acetaminophen (TYLENOL) 500 MG tablet Take 500-1,000 mg by mouth as needed.    [provider]  albuterol (VENTOLIN HFA) 108 (90 Base) MCG/ACT inhaler Inhale 1 puff into the lungs as needed. 10/18/22   Pincus Sanes, MD  albuterol (VENTOLIN HFA) 108 (90 Base) MCG/ACT inhaler Inhale 2 puffs into the lungs every 6 (six) hours as needed for wheezing or shortness of breath. 10/22/22   Pincus Sanes, MD  clonazePAM (KLONOPIN) 1 MG tablet Take 1 tablet (1 mg total) by mouth 2 (two) times daily as needed for anxiety. 01/18/23   Corwin Levins, MD  cyclobenzaprine  (FLEXERIL) 10 MG tablet 1 tab by mouth every 8 hrs as needed 01/18/23   Corwin Levins, MD  EPINEPHrine 0.3 mg/0.3 mL IJ SOAJ injection Inject 0.3 mg into the muscle as needed for anaphylaxis. 09/17/19   [provider]  ferrous sulfate 220 (44 Fe) MG/5ML solution Take 5 mLs (220 mg total) by mouth 2 (two) times daily with a meal. 01/18/23   Corwin Levins, MD  furosemide (LASIX) 40 MG tablet Take 40 mg by mouth. 11/19/22 11/19/23  [provider]  meloxicam (MOBIC) 15 MG tablet Take 1 tablet daily with food for 5-7 days, then as needed    [provider]  methocarbamol (ROBAXIN) 500 MG tablet Take 1 tablet (500 mg total) by mouth every 8 (eight) hours as needed for muscle spasms. 05/28/22   Elenore Paddy, NP  omeprazole (PRILOSEC) 40 MG capsule TAKE ONE CAPSULE BY MOUTH TWICE A DAY 11/05/22   Tressia Danas, MD  ondansetron [facility]) 4 MG tablet  10/29/22   [provider]  potassium chloride SA (KLOR-CON M) 20 MEQ tablet Take 2 tablets by mouth every AM and one tablet every PM 07/02/21   Corwin Levins, MD  traMADol (ULTRAM) 50 MG tablet Take 1 tablet (50 mg total) by mouth every 6 (six) hours as needed. 01/18/23   Corwin Levins, MD  traZODone (DESYREL) 50 MG tablet TAKE 1 TABLET BY MOUTH AT BEDTIME 08/10/22  [name], MD  triamterene-hydrochlorothiazide (DYAZIDE) 37.5-25 MG capsule TAKE 1 CAPSULE BY MOUTH DAILY 03/09/23   [name], MD      Allergies    Grass pollen([id number] vern), Lisinopril, and Other    Review of Systems   Review of Systems  Musculoskeletal:  Positive for arthralgias.  All other systems reviewed and are negative.   Physical Exam Updated Vital Signs BP (!) 122/55 (BP Location: Right Arm)   Pulse 80   Temp 98.2 F (36.8 C) (Oral)   Resp 18   LMP 11/10/2011   SpO2 99%  Physical Exam Vitals and nursing note reviewed.  Constitutional:      General: She is not in acute distress.    Appearance: Normal appearance. She is normal  weight. She is not ill-appearing, toxic-appearing or diaphoretic.  HENT:     Head: Normocephalic and atraumatic.  Cardiovascular:     Rate and Rhythm: Normal rate and regular rhythm.     Heart sounds: Normal heart sounds.  Pulmonary:     Effort: Pulmonary effort is normal. No respiratory distress.     Breath sounds: Normal breath sounds.  Abdominal:     General: Abdomen is flat.     Palpations: Abdomen is soft.  Musculoskeletal:        General: Normal range of motion.     Cervical back: Normal range of motion.     Comments: TTP throughout the left shoulder specifically over the anterior aspect of the right humeral head. No swelling, bruising, crepitus, or deformity. ROM limited due to pain. Radial and ulnar pulses intact and 2+. No tenderness to cervical spine, chest, elbow, wrist, or hand.  Skin:    General: Skin is warm and dry.  Neurological:     General: No focal deficit present.     Mental Status: She is alert.  Psychiatric:        Mood and Affect: Mood normal.        Behavior: Behavior normal.     ED Results / Procedures / Treatments   Labs (all labs ordered are listed, but only abnormal results are displayed) Labs Reviewed - No data to display  EKG None  Radiology DG Shoulder Left  Result Date: 03/30/2023 CLINICAL DATA:  Left shoulder pain EXAM: LEFT SHOULDER - 3 VIEW COMPARISON:  None Available. FINDINGS: Axillary view of the shoulder does not include the glenoid. There is no evidence of fracture or dislocation. There is no evidence of arthropathy or other focal bone abnormality. Soft tissues are unremarkable. IMPRESSION: 1. No acute fracture or dislocation. 2. Suboptimal axillary view of the shoulder, which does not include the glenoid. Electronically Signed   By: Agustin Cree M.D.   On: 03/30/2023 16:48    Procedures Procedures    Medications Ordered in ED Medications  ketorolac (TORADOL) 15 MG/ML injection 15 mg (15 mg Intramuscular Given 03/30/23 1556)    ED  Course/ Medical Decision Making/ A&P                                 Medical Decision Making Amount and/or Complexity of Data Reviewed Radiology: ordered.  Risk Prescription drug management.   Patient presents today with complaints of left shoulder injury x 2 weeks ago.  She is afebrile, nontoxic-appearing, and in no acute respiratory vital signs.  Physical exam reveals TTP over the left humeral head with limitations to ROM noted as well.  No  bruising, crepitus, deformity, or overlying skin changes.  Full ROM noted to the left elbow and wrist.  Heart and lung sounds unremarkable.  X-ray imaging of the shoulder which has resulted and reveals no acute findings, Suboptimal axillary view of the shoulder, which does not include the glenoid.  I personally reviewed and interpreted this imaging and agree with radiology interpretation.  Patient given a shoulder sling for support, I do have some suspicion that she may have reinjured the shoulder given her limitations to range of motion and her recent rotator cuff surgery.  She has an orthopedist that she follows with closely, recommend that she follows up with them. No suspicion for cardiac or pulmonary cause of symptoms.  Recommend conservative management and use of her already prescribed home narcotic pain medications.  Evaluation and diagnostic testing in the emergency department does not suggest an emergent condition requiring admission or immediate intervention beyond what has been performed at this time.  Plan for discharge with close PCP follow-up.  Patient is understanding and amenable with plan, educated on red flag symptoms that would prompt immediate return.  Patient discharged in stable condition.  Final Clinical Impression(s) / ED Diagnoses Final diagnoses:  Acute pain of left shoulder    Rx / DC Orders ED Discharge Orders     None     An After Visit Summary was printed and given to the patient.     Vear Clock 03/30/23  1703    Jacalyn Lefevre, MD 03/31/23 [phone]

## 2023-03-31 ENCOUNTER — Encounter: Payer: Self-pay | Admitting: Internal Medicine

## 2023-03-31 ENCOUNTER — Ambulatory Visit: Payer: 59 | Admitting: Internal Medicine

## 2023-03-31 VITALS — BP 130/86 | HR 79 | Temp 98.5°F | Ht 65.5 in | Wt 316.0 lb

## 2023-03-31 DIAGNOSIS — I10 Essential (primary) hypertension: Secondary | ICD-10-CM | POA: Diagnosis not present

## 2023-03-31 DIAGNOSIS — E119 Type 2 diabetes mellitus without complications: Secondary | ICD-10-CM | POA: Insufficient documentation

## 2023-03-31 DIAGNOSIS — E876 Hypokalemia: Secondary | ICD-10-CM

## 2023-03-31 DIAGNOSIS — D509 Iron deficiency anemia, unspecified: Secondary | ICD-10-CM

## 2023-03-31 DIAGNOSIS — E1169 Type 2 diabetes mellitus with other specified complication: Secondary | ICD-10-CM | POA: Diagnosis not present

## 2023-03-31 DIAGNOSIS — E785 Hyperlipidemia, unspecified: Secondary | ICD-10-CM

## 2023-03-31 DIAGNOSIS — T502X5A Adverse effect of carbonic-anhydrase inhibitors, benzothiadiazides and other diuretics, initial encounter: Secondary | ICD-10-CM

## 2023-03-31 LAB — CBC WITH DIFFERENTIAL/PLATELET
Basophils Absolute: 0 10*3/uL (ref 0.0–0.1)
Basophils Relative: 0.6 % (ref 0.0–3.0)
Eosinophils Absolute: 0.2 10*3/uL (ref 0.0–0.7)
Eosinophils Relative: 2.2 % (ref 0.0–5.0)
HCT: 40.6 % (ref 36.0–46.0)
Hemoglobin: 12.9 g/dL (ref 12.0–15.0)
Lymphocytes Relative: 36 % (ref 12.0–46.0)
Lymphs Abs: 2.6 10*3/uL (ref 0.7–4.0)
MCHC: 31.7 g/dL (ref 30.0–36.0)
MCV: 88.3 fl (ref 78.0–100.0)
Monocytes Absolute: 0.5 10*3/uL (ref 0.1–1.0)
Monocytes Relative: 6.2 % (ref 3.0–12.0)
Neutro Abs: 4 10*3/uL (ref 1.4–7.7)
Neutrophils Relative %: 55 % (ref 43.0–77.0)
Platelets: 338 10*3/uL (ref 150.0–400.0)
RBC: 4.6 Mil/uL (ref 3.87–5.11)
RDW: 14.5 % (ref 11.5–15.5)
WBC: 7.2 10*3/uL (ref 4.0–10.5)

## 2023-03-31 LAB — LIPID PANEL
Cholesterol: 231 mg/dL — ABNORMAL HIGH (ref 0–200)
HDL: 54.5 mg/dL (ref >39.00)
LDL Cholesterol: 139 mg/dL — ABNORMAL HIGH (ref 0–99)
NonHDL: 176.68
Total CHOL/HDL Ratio: 4
Triglycerides: 187 mg/dL — ABNORMAL HIGH (ref 0.0–149.0)
VLDL: 37.4 mg/dL (ref 0.0–40.0)

## 2023-03-31 LAB — IBC + FERRITIN
Ferritin: 31.3 ng/mL (ref 10.0–291.0)
Iron: 76 ug/dL (ref 42–145)
Saturation Ratios: 21.3 % (ref 20.0–50.0)
TIBC: 357 ug/dL (ref 250.0–450.0)
Transferrin: 255 mg/dL (ref 212.0–360.0)

## 2023-03-31 LAB — BASIC METABOLIC PANEL
BUN: 11 mg/dL (ref 6–23)
CO2: 29 meq/L (ref 19–32)
Calcium: 8.9 mg/dL (ref 8.4–10.5)
Chloride: 104 meq/L (ref 96–112)
Creatinine, Ser: 0.8 mg/dL (ref 0.40–1.20)
GFR: 83.2 mL/min (ref >60.00)
Glucose, Bld: 111 mg/dL — ABNORMAL HIGH (ref 70–99)
Potassium: 3.7 meq/L (ref 3.5–5.1)
Sodium: 141 meq/L (ref 135–145)

## 2023-03-31 LAB — URINALYSIS, ROUTINE W REFLEX MICROSCOPIC
Bilirubin Urine: NEGATIVE
Hgb urine dipstick: NEGATIVE
Ketones, ur: NEGATIVE
Leukocytes,Ua: NEGATIVE
Nitrite: NEGATIVE
Specific Gravity, Urine: 1.025 (ref 1.000–1.030)
Total Protein, Urine: NEGATIVE
Urine Glucose: NEGATIVE
Urobilinogen, UA: 0.2 (ref 0.0–1.0)
pH: 6 (ref 5.0–8.0)

## 2023-03-31 LAB — MICROALBUMIN / CREATININE URINE RATIO
Creatinine,U: 218.9 mg/dL
Microalb Creat Ratio: 0.7 mg/g (ref 0.0–30.0)
Microalb, Ur: 1.6 mg/dL (ref 0.0–1.9)

## 2023-03-31 LAB — TSH: TSH: 1.39 u[IU]/mL (ref 0.35–5.50)

## 2023-03-31 LAB — HEMOGLOBIN A1C: Hgb A1c MFr Bld: 6.7 % — ABNORMAL HIGH (ref 4.6–6.5)

## 2023-03-31 MED ORDER — TIRZEPATIDE 2.5 MG/0.5ML ~~LOC~~ SOAJ: 2.5000 mg | SUBCUTANEOUS | 0 refills | Status: DC

## 2023-03-31 NOTE — Patient Instructions (Signed)

## 2023-03-31 NOTE — Progress Notes (Signed)
Subjective:  Patient ID: Jessica Davenport, female    DOB: Jan 06, 1966  Age: 57 y.o. MRN: [id number]  CC: Anemia, Hypertension, and Hyperlipidemia   HPI [name] presents for f/up ---  Discussed the use of AI scribe software for clinical note transcription with the patient, who gave verbal consent to proceed.  History of Present Illness   The patient, with a history of hypertension, cardiac issues, and obesity, presents with concerns about her weight and blood pressure control. They report that their blood pressure has been more under control recently, with no associated headaches or blurred vision. They deny any chest pain or shortness of breath. However, they do note intermittent lower extremity swelling, which they manage with fluid pills as needed, as advised by their cardiac surgeon.  The patient is a non-smoker and non-drinker. They had an EKG last year and have another scheduled for the upcoming October.  They also report a history of anemia, which has improved with liquid iron supplementation. They deny any current blood loss. They use an albuterol inhaler as needed and take Prilosec twice daily for acid reflux.   The patient recently recovered from COVID-19, with resolution of symptoms including fever, chills, and sore throat. They report a past ear infection, with the left ear being more affected than the right.  They have been trying to stay active by walking with a neighbor for about thirty minutes each evening. They had a mammogram on the 10th of the current month, with no reported issues. They are due for an eye exam this year, with the last one being in September of the previous year.       Outpatient Medications Prior to Visit  Medication Sig Dispense Refill   albuterol (VENTOLIN HFA) 108 (90 Base) MCG/ACT inhaler Inhale 1 puff into the lungs as needed. 18 g 5   albuterol (VENTOLIN HFA) 108 (90 Base) MCG/ACT inhaler Inhale 2 puffs into the lungs every 6 (six) hours  as needed for wheezing or shortness of breath. 8 g 2   clonazePAM (KLONOPIN) 1 MG tablet Take 1 tablet (1 mg total) by mouth 2 (two) times daily as needed for anxiety. 60 tablet 1   cyclobenzaprine (FLEXERIL) 10 MG tablet 1 tab by mouth every 8 hrs as needed 40 tablet 1   EPINEPHrine 0.3 mg/0.3 mL IJ SOAJ injection Inject 0.3 mg into the muscle as needed for anaphylaxis.     ferrous sulfate 220 (44 Fe) MG/5ML solution Take 5 mLs (220 mg total) by mouth 2 (two) times daily with a meal. 300 mL 5   furosemide (LASIX) 40 MG tablet Take 40 mg by mouth.     meloxicam (MOBIC) 15 MG tablet Take 1 tablet daily with food for 5-7 days, then as needed     methocarbamol (ROBAXIN) 500 MG tablet Take 1 tablet (500 mg total) by mouth every 8 (eight) hours as needed for muscle spasms. 10 tablet 1   omeprazole (PRILOSEC) 40 MG capsule TAKE ONE CAPSULE BY MOUTH TWICE A DAY 60 capsule 11   ondansetron (ZOFRAN) 4 MG tablet      potassium chloride SA (KLOR-CON M) 20 MEQ tablet Take 2 tablets by mouth every AM and one tablet every PM 270 tablet 3   traMADol (ULTRAM) 50 MG tablet Take 1 tablet (50 mg total) by mouth every 6 (six) hours as needed. 60 tablet 0   traZODone (DESYREL) 50 MG tablet TAKE 1 TABLET BY MOUTH AT BEDTIME 90 tablet 1  triamterene-hydrochlorothiazide (DYAZIDE) 37.5-25 MG capsule TAKE 1 CAPSULE BY MOUTH DAILY 90 capsule 0   acetaminophen (TYLENOL) 500 MG tablet Take 500-1,000 mg by mouth as needed.     No facility-administered medications prior to visit.    ROS Review of Systems  Constitutional:  Negative for chills, diaphoresis, fatigue and fever.  HENT: Negative.    Eyes:  Negative for visual disturbance.  Respiratory:  Negative for cough, chest tightness, shortness of breath and wheezing.   Cardiovascular:  Negative for chest pain, palpitations and leg swelling.  Gastrointestinal:  Negative for abdominal pain, constipation, diarrhea, nausea and vomiting.  Genitourinary:  Negative for  difficulty urinating and dysuria.  Musculoskeletal:  Positive for arthralgias and gait problem. Negative for myalgias and neck pain.  Skin: Negative.   Neurological:  Negative for dizziness, weakness and light-headedness.  Hematological:  Negative for adenopathy. Does not bruise/bleed easily.  Psychiatric/Behavioral: Negative.      Objective:  BP 130/86 (BP Location: Right Arm, Patient Position: Sitting, Cuff Size: Large)   Pulse 79   Temp 98.5 F (36.9 C) (Oral)   Ht 5' 5.5" (1.664 m)   Wt (!) 316 lb (143.3 kg)   LMP 11/10/2011   SpO2 99%   BMI 51.79 kg/m   BP Readings from Last 3 Encounters:  03/31/23 130/86  03/30/23 (!) 126/58  03/16/23 124/82    Wt Readings from Last 3 Encounters:  03/31/23 (!) 316 lb (143.3 kg)  03/16/23 (!) 316 lb (143.3 kg)  01/18/23 (!) 320 lb (145.2 kg)    Physical Exam Vitals reviewed.  Constitutional:      Appearance: Normal appearance. She is obese.  HENT:     Mouth/Throat:     Mouth: Mucous membranes are moist.  Eyes:     General: No scleral icterus.    Conjunctiva/sclera: Conjunctivae normal.  Cardiovascular:     Rate and Rhythm: Normal rate and regular rhythm.     Pulses: Normal pulses.     Heart sounds: No murmur heard.    No friction rub. No gallop.  Pulmonary:     Effort: Pulmonary effort is normal.     Breath sounds: No stridor. No wheezing, rhonchi or rales.  Abdominal:     General: Abdomen is flat.     Palpations: There is no mass.     Tenderness: There is no abdominal tenderness. There is no guarding.     Hernia: No hernia is present.  Musculoskeletal:        General: Normal range of motion.     Cervical back: Neck supple.     Right lower leg: No edema.     Left lower leg: No edema.  Lymphadenopathy:     Cervical: No cervical adenopathy.  Skin:    General: Skin is warm and dry.  Neurological:     General: No focal deficit present.     Mental Status: She is alert. Mental status is at baseline.  Psychiatric:         Mood and Affect: Mood normal.        Behavior: Behavior normal.     Lab Results  Component Value Date   WBC 7.2 03/31/2023   HGB 12.9 03/31/2023   HCT 40.6 03/31/2023   PLT 338.0 03/31/2023   GLUCOSE 111 (H) 03/31/2023   CHOL 231 (H) 03/31/2023   TRIG 187.0 (H) 03/31/2023   HDL 54.50 03/31/2023   LDLDIRECT 148.1 06/16/2012   LDLCALC 139 (H) 03/31/2023   ALT 12 09/23/2022  AST 12 09/23/2022   NA 141 03/31/2023   K 3.7 03/31/2023   CL 104 03/31/2023   CREATININE 0.80 03/31/2023   BUN 11 03/31/2023   CO2 29 03/31/2023   TSH 1.39 03/31/2023   HGBA1C 6.7 (H) 03/31/2023   MICROALBUR 1.6 03/31/2023    DG Shoulder Left  Result Date: 03/30/2023 CLINICAL DATA:  Left shoulder pain EXAM: LEFT SHOULDER - 3 VIEW COMPARISON:  None Available. FINDINGS: Axillary view of the shoulder does not include the glenoid. There is no evidence of fracture or dislocation. There is no evidence of arthropathy or other focal bone abnormality. Soft tissues are unremarkable. IMPRESSION: 1. No acute fracture or dislocation. 2. Suboptimal axillary view of the shoulder, which does not include the glenoid. Electronically Signed   By: Agustin Cree M.D.   On: 03/30/2023 16:48    Assessment & Plan:   Iron deficiency anemia, unspecified iron deficiency anemia type- H&H and iron levels are normal. -     IBC + Ferritin; Future -     CBC with Differential/Platelet; Future  Diuretic-induced hypokalemia -     Basic metabolic panel; Future  Dyslipidemia, goal LDL below 130- Will start a statin for cardiovascular risk reduction. -     Lipid panel; Future -     Rosuvastatin Calcium; Take 1 tablet (20 mg total) by mouth daily.  Dispense: 90 tablet; Refill: 1  Type 2 diabetes mellitus with other specified complication, without long-term current use of insulin (HCC)- Will treat with a GIP/GLP agonist. -     Hemoglobin A1c; Future -     Microalbumin / creatinine urine ratio; Future -     Urinalysis, Routine w reflex  microscopic; Future -     Tirzepatide; Inject 2.5 mg into the skin once a week.  Dispense: 2 mL; Refill: 0 -     HM Diabetes Foot Exam -     Ambulatory referral to Ophthalmology  Accelerated hypertension- Her blood pressure is adequately well-controlled. -     Basic metabolic panel; Future -     TSH; Future -     Urinalysis, Routine w reflex microscopic; Future     Follow-up: Return in about 4 months (around 07/31/2023).  Sanda Linger, MD

## 2023-04-01 MED ORDER — ROSUVASTATIN CALCIUM 20 MG PO TABS
20.0000 mg | ORAL_TABLET | Freq: Every day | ORAL | 1 refills | Status: DC
Start: 2023-04-01 — End: 2023-12-22

## 2023-05-01 ENCOUNTER — Other Ambulatory Visit: Payer: Self-pay | Admitting: Internal Medicine

## 2023-05-01 DIAGNOSIS — E1169 Type 2 diabetes mellitus with other specified complication: Secondary | ICD-10-CM

## 2023-05-01 MED ORDER — TIRZEPATIDE 5 MG/0.5ML ~~LOC~~ SOAJ
5.0000 mg | SUBCUTANEOUS | 0 refills | Status: DC
Start: 2023-05-01 — End: 2023-07-26

## 2023-06-06 ENCOUNTER — Other Ambulatory Visit: Payer: Self-pay | Admitting: Otolaryngology

## 2023-06-07 LAB — SURGICAL PATHOLOGY

## 2023-06-21 LAB — HM DIABETES EYE EXAM

## 2023-07-04 ENCOUNTER — Other Ambulatory Visit: Payer: Self-pay | Admitting: Internal Medicine

## 2023-07-04 DIAGNOSIS — I10 Essential (primary) hypertension: Secondary | ICD-10-CM

## 2023-07-22 ENCOUNTER — Other Ambulatory Visit: Payer: Self-pay | Admitting: Internal Medicine

## 2023-07-22 DIAGNOSIS — E1169 Type 2 diabetes mellitus with other specified complication: Secondary | ICD-10-CM

## 2023-07-25 ENCOUNTER — Other Ambulatory Visit: Payer: Self-pay | Admitting: Internal Medicine

## 2023-07-25 DIAGNOSIS — E1169 Type 2 diabetes mellitus with other specified complication: Secondary | ICD-10-CM

## 2023-07-26 ENCOUNTER — Other Ambulatory Visit: Payer: Self-pay | Admitting: Internal Medicine

## 2023-07-26 DIAGNOSIS — E1169 Type 2 diabetes mellitus with other specified complication: Secondary | ICD-10-CM

## 2023-07-26 NOTE — Telephone Encounter (Signed)
 Medication: Tirepatide 5 mg/0.5 mL Directions: Inject 5 mg under the skin once weekly Last given: 05/01/23 Number refills: 0 Last o/v: 03/31/23 Follow up: 4 months f/u (07/31/23) Labs: 03/31/23

## 2023-08-01 ENCOUNTER — Ambulatory Visit: Payer: 59 | Admitting: Internal Medicine

## 2023-08-12 ENCOUNTER — Ambulatory Visit: Payer: 59 | Admitting: Internal Medicine

## 2023-08-12 DIAGNOSIS — R52 Pain, unspecified: Secondary | ICD-10-CM | POA: Insufficient documentation

## 2023-08-14 NOTE — Progress Notes (Unsigned)
   Acute Office Visit  Subjective:     Patient ID: Jessica Davenport, female    DOB: 1965/10/24, 58 y.o.   MRN: [id number]  No chief complaint on file.   HPI Patient is in today for evaluation of ***, for the last ***. Has tried Denies known sick contacts, Denies abdominal pain, nausea, vomiting, diarrhea, rash, fever, chills, other symptoms.  Medical hx as outlined below.  ROS Per HPI      Objective:    LMP 11/10/2011    Physical Exam Vitals and nursing note reviewed.  HENT:     Head: Normocephalic and atraumatic.     Nose: No congestion.     Mouth/Throat:     Mouth: Mucous membranes are moist.     Pharynx: Oropharynx is clear. No oropharyngeal exudate or posterior oropharyngeal erythema.  Eyes:     Extraocular Movements: Extraocular movements intact.  Cardiovascular:     Rate and Rhythm: Normal rate and regular rhythm.  Pulmonary:     Effort: Pulmonary effort is normal. No respiratory distress.  Musculoskeletal:     Cervical back: Normal range of motion and neck supple.  Lymphadenopathy:     Cervical: No cervical adenopathy.  Skin:    General: Skin is warm and dry.  Neurological:     Mental Status: She is alert.     No results found for any visits on 08/15/23.      Assessment & Plan:  ***  No orders of the defined types were placed in this encounter.   No follow-ups on file.  Moshe Cipro, FNP

## 2023-08-15 ENCOUNTER — Encounter: Payer: Self-pay | Admitting: Family Medicine

## 2023-08-15 ENCOUNTER — Ambulatory Visit: Payer: 59 | Admitting: Family Medicine

## 2023-08-15 VITALS — BP 138/90 | HR 81 | Temp 98.8°F

## 2023-08-15 DIAGNOSIS — B9689 Other specified bacterial agents as the cause of diseases classified elsewhere: Secondary | ICD-10-CM

## 2023-08-15 DIAGNOSIS — R6883 Chills (without fever): Secondary | ICD-10-CM

## 2023-08-15 DIAGNOSIS — J069 Acute upper respiratory infection, unspecified: Secondary | ICD-10-CM | POA: Diagnosis not present

## 2023-08-15 DIAGNOSIS — R051 Acute cough: Secondary | ICD-10-CM

## 2023-08-15 DIAGNOSIS — R062 Wheezing: Secondary | ICD-10-CM | POA: Diagnosis not present

## 2023-08-15 LAB — POCT INFLUENZA A/B
Influenza A, POC: NEGATIVE
Influenza B, POC: NEGATIVE

## 2023-08-15 LAB — POC COVID19 BINAXNOW: SARS Coronavirus 2 Ag: NEGATIVE

## 2023-08-15 MED ORDER — FLUCONAZOLE 150 MG PO TABS: ORAL_TABLET | ORAL | 0 refills | Status: DC

## 2023-08-15 MED ORDER — HYDROCODONE BIT-HOMATROP MBR 5-1.5 MG/5ML PO SOLN: 5.0000 mL | Freq: Three times a day (TID) | ORAL | 0 refills | Status: DC | PRN

## 2023-08-15 MED ORDER — AMOXICILLIN-POT CLAVULANATE 875-125 MG PO TABS: 1.0000 | ORAL_TABLET | Freq: Two times a day (BID) | ORAL | 0 refills | Status: AC

## 2023-08-15 MED ORDER — PREDNISONE 20 MG PO TABS: 40.0000 mg | ORAL_TABLET | Freq: Every day | ORAL | 0 refills | Status: AC

## 2023-08-15 NOTE — Patient Instructions (Signed)
I have sent in Augmentin for you to take twice a day for 7 days.  This medication can upset your stomach, so I tell everyone to take it with a meal.  I have sent in prednisone for you to take 2 tablets once daily in the morning with breakfast for the next 5 days.  I have sent in hydrocodone cough syrup for you to take 5 mL once daily in the evening as needed for cough.  This medication may make you sleepy.  Do not drive or operate heavy machinery while taking this medication.  I have also sent in Diflucan for you to take in case of yeast after antibiotic treatment.  If you are having symptoms when you complete antibiotics, may take 1 tablet.  If you are still having symptoms 3 days later, may take the second tablet.  Follow-up with me for new or worsening symptoms.

## 2023-08-20 ENCOUNTER — Other Ambulatory Visit: Payer: Self-pay | Admitting: Internal Medicine

## 2023-08-20 DIAGNOSIS — E1169 Type 2 diabetes mellitus with other specified complication: Secondary | ICD-10-CM

## 2023-08-24 ENCOUNTER — Ambulatory Visit: Payer: 59 | Admitting: Internal Medicine

## 2023-08-26 DIAGNOSIS — M7541 Impingement syndrome of right shoulder: Secondary | ICD-10-CM | POA: Diagnosis not present

## 2023-08-26 DIAGNOSIS — M542 Cervicalgia: Secondary | ICD-10-CM | POA: Diagnosis not present

## 2023-09-02 ENCOUNTER — Encounter: Payer: Self-pay | Admitting: Internal Medicine

## 2023-09-02 ENCOUNTER — Telehealth: Payer: 59 | Admitting: Nurse Practitioner

## 2023-09-02 ENCOUNTER — Ambulatory Visit: Payer: Self-pay | Admitting: Internal Medicine

## 2023-09-02 DIAGNOSIS — L509 Urticaria, unspecified: Secondary | ICD-10-CM

## 2023-09-02 MED ORDER — PREDNISONE 20 MG PO TABS: 20.0000 mg | ORAL_TABLET | Freq: Two times a day (BID) | ORAL | 0 refills | Status: AC

## 2023-09-02 MED ORDER — FAMOTIDINE 20 MG PO TABS: 20.0000 mg | ORAL_TABLET | Freq: Two times a day (BID) | ORAL | 0 refills | Status: DC

## 2023-09-02 MED ORDER — CETIRIZINE HCL 10 MG PO TABS: 10.0000 mg | ORAL_TABLET | Freq: Every day | ORAL | 0 refills | Status: DC

## 2023-09-02 NOTE — Telephone Encounter (Signed)
Chief Complaint: Hives Symptoms: itching Frequency: About 2 days Pertinent Negatives: Patient denies SOB, fever Disposition: [] ED /[x] Urgent Care (no appt availability in office) / [] Appointment(In office/virtual)/ []  [place] [facility]/ [] Home Care/ [] Refused Recommended Disposition /[] [place] [facility]/ []  Follow-up with PCP Additional Notes: Pt reports she developed hives approx 2 days ago, they have worsened, pt reports severe itching. OTC/home remedies have given her little to no improvement. Pt denies swelling to lips, tongue or face or any difficulty breathing. No OV available today. Virtual UC visit scheduled. This RN educated pt on home care, new-worsening symptoms, when to call back/seek emergent care. Pt verbalized understanding and agrees to plan.    Copied from CRM [id number]. Topic: Clinical - Red Word Triage >> Sep 02, 2023  4:19 PM Taleah C wrote: Red Word that prompted transfer to Nurse Triage: body temperature increasing, hives gradually increasing. Reason for Disposition  [1] MODERATE-SEVERE hives persist (i.e., hives interfere with normal activities or work) AND [2] taking antihistamine (e.g., Benadryl, Claritin) > 24 hours  Answer Assessment - Initial Assessment Questions 1. APPEARANCE: "What does the rash look like?"      Hives 2. LOCATION: "Where is the rash located?"   5. ONSET: "When did the hives begin?" (Hours or days ago)      About 2 days ago 6. ITCHING: "Does it itch?" If Yes, ask: "How bad is the itch?"    - MILD: doesn't interfere with normal activities   - MODERATE-SEVERE: interferes with work, school, sleep, or other activities      Severe 7. RECURRENT PROBLEM: "Have you had hives before?" If Yes, ask: "When was the last time?" and "What happened that time?"      Yes 8. TRIGGERS: "Were you exposed to any new food, plant, cosmetic product or animal just before the hives began?"     None pt is aware of 9. OTHER SYMPTOMS: "Do you have any other  symptoms?" (e.g., fever, tongue swelling, difficulty breathing, abdomen pain)     Left side of lip is swollen  Protocols used: Hives-A-AH

## 2023-09-02 NOTE — Progress Notes (Signed)
Virtual Visit Consent   Jessica Davenport, you are scheduled for a virtual visit with a [facility] provider today. Just as with appointments in the office, your consent must be obtained to participate. Your consent will be active for this visit and any virtual visit you may have with one of our providers in the next 365 days. If you have a MyChart account, a copy of this consent can be sent to you electronically.  As this is a virtual visit, video technology does not allow for your provider to perform a traditional examination. This may limit your provider's ability to fully assess your condition. If your provider identifies any concerns that need to be evaluated in person or the need to arrange testing (such as labs, EKG, etc.), we will make arrangements to do so. Although advances in technology are sophisticated, we cannot ensure that it will always work on either your end or our end. If the connection with a video visit is poor, the visit may have to be switched to a telephone visit. With either a video or telephone visit, we are not always able to ensure that we have a secure connection.  By engaging in this virtual visit, you consent to the provision of healthcare and authorize for your insurance to be billed (if applicable) for the services provided during this visit. Depending on your insurance coverage, you may receive a charge related to this service.  I need to obtain your verbal consent now. Are you willing to proceed with your visit today? Jessica Davenport has provided verbal consent on 09/02/2023 for a virtual visit (video or telephone). Viviano Simas, FNP  Date: 09/02/2023 7:17 PM   Virtual Visit via Video Note   I, Viviano Simas, connected with  Jessica Davenport  ([id number], 02/26/1966) on 09/02/23 at  7:15 PM EST by a video-enabled telemedicine application and verified that I am speaking with the correct person using two identifiers.  Location: Patient: Virtual Visit Location  Patient: Home Provider: Virtual Visit Location Provider: Home Office   I discussed the limitations of evaluation and management by telemedicine and the availability of in person appointments. The patient expressed understanding and agreed to proceed.    History of Present Illness: Jessica Davenport is a 58 y.o. who identifies as a female who was assigned female at birth, and is being seen today for hives  Symptoms started two days ago  She took benadryl and cool towel and was able to improve symptoms temporarily  She has continued to take benadryl around the clock since that time   Feels the hives are "under her skin" and worsening today and spreading to her face   This has happened to her in the past it has been over a year since her last episode  Rash is on: Both legs, both arms, torso/back  Denies any oral symptoms, tongue is normal, breathing not effected no throat symptoms   She denies any known exposure to triggers including her food allergies     Problems:  Patient Active Problem List   Diagnosis Date Noted   Dyslipidemia, goal LDL below 130 03/31/2023   Diabetes mellitus (HCC) 03/31/2023   Impingement syndrome of right shoulder region 01/24/2023   Diverticulosis 07/15/2022   Accelerated hypertension 07/08/2022   Encounter for general adult medical examination with abnormal findings 07/08/2022   Low back pain 06/15/2022   Mild intermittent asthma without complication 06/05/2021   Idiopathic intracranial hypertension 03/25/2020   Diuretic-induced hypokalemia 02/26/2020  Irritable bowel syndrome with diarrhea 02/26/2020   Esophageal dysphagia 01/15/2020   GAD (generalized anxiety disorder) 01/09/2020   Morbid obesity with BMI of 45.0-49.9, adult (HCC) 02/23/2019   Stasis dermatitis of both legs 01/16/2015   ASD (atrial septal defect) 04/24/2013   Constipation 06/16/2012   Pure hypercholesterolemia 06/16/2012   Intrinsic eczema 01/08/2010   Pulmonary HTN (HCC)  08/20/2009   ALLERGIC RHINITIS 08/20/2009   DISC DISEASE, LUMBAR 08/20/2009   Vitamin D deficiency 08/19/2009   Iron deficiency anemia 08/19/2009    Allergies:  Allergies  Allergen Reactions   Grass Pollen(K-O-R-T-Swt Vern) Hives   Lisinopril Hives   Other     Celery and green peas   Metformin And Related Diarrhea   Medications:  Current Outpatient Medications:    albuterol (VENTOLIN HFA) 108 (90 Base) MCG/ACT inhaler, Inhale 1 puff into the lungs as needed., Disp: 18 g, Rfl: 5   albuterol (VENTOLIN HFA) 108 (90 Base) MCG/ACT inhaler, Inhale 2 puffs into the lungs every 6 (six) hours as needed for wheezing or shortness of breath., Disp: 8 g, Rfl: 2   clonazePAM (KLONOPIN) 1 MG tablet, Take 1 tablet (1 mg total) by mouth 2 (two) times daily as needed for anxiety., Disp: 60 tablet, Rfl: 1   EPINEPHrine 0.3 mg/0.3 mL IJ SOAJ injection, Inject 0.3 mg into the muscle as needed for anaphylaxis., Disp: , Rfl:    fluconazole (DIFLUCAN) 150 MG tablet, Take one tablet at the onset of symptoms, if still having symptoms in 3 days, take the second tablet., Disp: 2 tablet, Rfl: 0   furosemide (LASIX) 40 MG tablet, Take 40 mg by mouth., Disp: , Rfl:    HYDROcodone bit-homatropine (HYCODAN) 5-1.5 MG/5ML syrup, Take 5 mLs by mouth every 8 (eight) hours as needed for cough., Disp: 120 mL, Rfl: 0   meloxicam (MOBIC) 15 MG tablet, Take 1 tablet daily with food for 5-7 days, then as needed, Disp: , Rfl:    MOUNJARO 5 MG/0.5ML Pen, INJECT 5 MG UNDER THE SKIN ONCE WEEKLY, Disp: 2 mL, Rfl: 0   omeprazole (PRILOSEC) 40 MG capsule, TAKE ONE CAPSULE BY MOUTH TWICE A DAY, Disp: 60 capsule, Rfl: 11   rosuvastatin (CRESTOR) 20 MG tablet, Take 1 tablet (20 mg total) by mouth daily., Disp: 90 tablet, Rfl: 1   traMADol (ULTRAM) 50 MG tablet, Take 1 tablet (50 mg total) by mouth every 6 (six) hours as needed., Disp: 60 tablet, Rfl: 0   traZODone (DESYREL) 50 MG tablet, TAKE 1 TABLET BY MOUTH AT BEDTIME, Disp: 90 tablet,  Rfl: 1   triamterene-hydrochlorothiazide (DYAZIDE) 37.5-25 MG capsule, TAKE 1 CAPSULE BY MOUTH DAILY, Disp: 90 capsule, Rfl: 0  Observations/Objective: Patient is well-developed, well-nourished in no acute distress.  Resting comfortably  at home.  Head is normocephalic, atraumatic.  No labored breathing.  Speech is clear and coherent with logical content.  Patient is alert and oriented at baseline.    Assessment and Plan:  1. Hives (Primary) If symptoms persist or with any involvement of oral/respiratory tract seek in person evaluation   Meds ordered this encounter  Medications   famotidine (PEPCID) 20 MG tablet    Sig: Take 1 tablet (20 mg total) by mouth 2 (two) times daily for 7 days.    Dispense:  14 tablet    Refill:  0   predniSONE (DELTASONE) 20 MG tablet    Sig: Take 1 tablet (20 mg total) by mouth 2 (two) times daily with a meal for 5  days.    Dispense:  10 tablet    Refill:  0   cetirizine (ZYRTEC ALLERGY) 10 MG tablet    Sig: Take 1 tablet (10 mg total) by mouth at bedtime.    Dispense:  30 tablet    Refill:  0        Follow Up Instructions: I discussed the assessment and treatment plan with the patient. The patient was provided an opportunity to ask questions and all were answered. The patient agreed with the plan and demonstrated an understanding of the instructions.  A copy of instructions were sent to the patient via MyChart unless otherwise noted below.    The patient was advised to call back or seek an in-person evaluation if the symptoms worsen or if the condition fails to improve as anticipated.    Viviano Simas, FNP

## 2023-09-17 ENCOUNTER — Other Ambulatory Visit: Payer: Self-pay | Admitting: Internal Medicine

## 2023-09-17 DIAGNOSIS — E1169 Type 2 diabetes mellitus with other specified complication: Secondary | ICD-10-CM

## 2023-09-17 DIAGNOSIS — Z419 Encounter for procedure for purposes other than remedying health state, unspecified: Secondary | ICD-10-CM | POA: Diagnosis not present

## 2023-09-28 LAB — HM DIABETES EYE EXAM

## 2023-10-15 ENCOUNTER — Other Ambulatory Visit: Payer: Self-pay | Admitting: Internal Medicine

## 2023-10-15 DIAGNOSIS — E1169 Type 2 diabetes mellitus with other specified complication: Secondary | ICD-10-CM

## 2023-10-17 ENCOUNTER — Telehealth: Payer: Self-pay

## 2023-10-17 NOTE — Telephone Encounter (Signed)
 Pharmacy Patient Advocate Encounter   Received notification from CoverMyMeds that prior authorization for  Mounjaro 5MG /0.5ML auto-injectors is required/requested.   Insurance verification completed.   The patient is insured through [facility] [place] .   Per test claim: PA required; PA submitted to above mentioned insurance via CoverMyMeds Key/confirmation #/EOC [id number] Status is pending

## 2023-10-18 NOTE — Telephone Encounter (Signed)
 Pharmacy Patient Advocate Encounter  Received notification from [facility] Medicaid that Prior Authorization for [facility] 5MG /0.5ML auto-injectors has been DENIED.  Full denial letter will be uploaded to the media tab. See denial reason below.   PA #/Case ID/Reference #: [id number]

## 2023-10-20 NOTE — Telephone Encounter (Signed)
 Patient has been made aware she gave a verbal understanding.

## 2023-10-28 DIAGNOSIS — M542 Cervicalgia: Secondary | ICD-10-CM | POA: Diagnosis not present

## 2023-10-29 DIAGNOSIS — Z419 Encounter for procedure for purposes other than remedying health state, unspecified: Secondary | ICD-10-CM | POA: Diagnosis not present

## 2023-11-05 ENCOUNTER — Other Ambulatory Visit: Payer: Self-pay | Admitting: Internal Medicine

## 2023-11-05 DIAGNOSIS — I10 Essential (primary) hypertension: Secondary | ICD-10-CM

## 2023-11-08 ENCOUNTER — Other Ambulatory Visit: Payer: Self-pay | Admitting: Internal Medicine

## 2023-11-08 DIAGNOSIS — I10 Essential (primary) hypertension: Secondary | ICD-10-CM

## 2023-11-08 DIAGNOSIS — E1169 Type 2 diabetes mellitus with other specified complication: Secondary | ICD-10-CM

## 2023-11-08 DIAGNOSIS — Z4789 Encounter for other orthopedic aftercare: Secondary | ICD-10-CM | POA: Diagnosis not present

## 2023-11-08 DIAGNOSIS — Z7689 Persons encountering health services in other specified circumstances: Secondary | ICD-10-CM | POA: Diagnosis not present

## 2023-11-09 MED ORDER — TRIAMTERENE-HCTZ 37.5-25 MG PO CAPS: 1.0000 | ORAL_CAPSULE | Freq: Every day | ORAL | 0 refills | Status: DC

## 2023-11-14 ENCOUNTER — Other Ambulatory Visit (HOSPITAL_COMMUNITY): Payer: Self-pay

## 2023-11-14 ENCOUNTER — Other Ambulatory Visit: Payer: Self-pay | Admitting: Internal Medicine

## 2023-11-14 DIAGNOSIS — E1169 Type 2 diabetes mellitus with other specified complication: Secondary | ICD-10-CM

## 2023-11-14 MED ORDER — MOUNJARO 5 MG/0.5ML ~~LOC~~ SOAJ: 5.0000 mg | SUBCUTANEOUS | 5 refills | Status: DC

## 2023-11-24 DIAGNOSIS — M5412 Radiculopathy, cervical region: Secondary | ICD-10-CM | POA: Diagnosis not present

## 2023-11-24 DIAGNOSIS — M542 Cervicalgia: Secondary | ICD-10-CM | POA: Diagnosis not present

## 2023-11-24 DIAGNOSIS — R296 Repeated falls: Secondary | ICD-10-CM | POA: Insufficient documentation

## 2023-11-24 DIAGNOSIS — Z7689 Persons encountering health services in other specified circumstances: Secondary | ICD-10-CM | POA: Diagnosis not present

## 2023-11-24 DIAGNOSIS — M7541 Impingement syndrome of right shoulder: Secondary | ICD-10-CM | POA: Diagnosis not present

## 2023-11-28 DIAGNOSIS — Z419 Encounter for procedure for purposes other than remedying health state, unspecified: Secondary | ICD-10-CM | POA: Diagnosis not present

## 2023-11-29 DIAGNOSIS — Z4789 Encounter for other orthopedic aftercare: Secondary | ICD-10-CM | POA: Diagnosis not present

## 2023-11-29 DIAGNOSIS — M25521 Pain in right elbow: Secondary | ICD-10-CM | POA: Diagnosis not present

## 2023-11-29 DIAGNOSIS — M25532 Pain in left wrist: Secondary | ICD-10-CM | POA: Diagnosis not present

## 2023-11-29 DIAGNOSIS — M25531 Pain in right wrist: Secondary | ICD-10-CM | POA: Diagnosis not present

## 2023-11-29 DIAGNOSIS — M25522 Pain in left elbow: Secondary | ICD-10-CM | POA: Diagnosis not present

## 2023-11-29 DIAGNOSIS — Z7689 Persons encountering health services in other specified circumstances: Secondary | ICD-10-CM | POA: Diagnosis not present

## 2023-12-09 ENCOUNTER — Other Ambulatory Visit: Payer: Self-pay | Admitting: Internal Medicine

## 2023-12-09 DIAGNOSIS — I10 Essential (primary) hypertension: Secondary | ICD-10-CM

## 2023-12-14 DIAGNOSIS — Z7689 Persons encountering health services in other specified circumstances: Secondary | ICD-10-CM | POA: Diagnosis not present

## 2023-12-14 DIAGNOSIS — M5412 Radiculopathy, cervical region: Secondary | ICD-10-CM | POA: Diagnosis not present

## 2023-12-14 DIAGNOSIS — M542 Cervicalgia: Secondary | ICD-10-CM | POA: Diagnosis not present

## 2023-12-20 ENCOUNTER — Encounter: Payer: Self-pay | Admitting: Internal Medicine

## 2023-12-21 DIAGNOSIS — M542 Cervicalgia: Secondary | ICD-10-CM | POA: Diagnosis not present

## 2023-12-21 DIAGNOSIS — Z7689 Persons encountering health services in other specified circumstances: Secondary | ICD-10-CM | POA: Diagnosis not present

## 2023-12-21 NOTE — Telephone Encounter (Signed)
 She is overdue for an appt and A1C

## 2023-12-22 ENCOUNTER — Ambulatory Visit: Admitting: Internal Medicine

## 2023-12-22 ENCOUNTER — Encounter: Payer: Self-pay | Admitting: Internal Medicine

## 2023-12-22 VITALS — BP 132/80 | HR 84 | Temp 98.3°F | Resp 16 | Ht 65.5 in | Wt 294.4 lb

## 2023-12-22 DIAGNOSIS — I10 Essential (primary) hypertension: Secondary | ICD-10-CM

## 2023-12-22 DIAGNOSIS — E1169 Type 2 diabetes mellitus with other specified complication: Secondary | ICD-10-CM

## 2023-12-22 DIAGNOSIS — T502X5A Adverse effect of carbonic-anhydrase inhibitors, benzothiadiazides and other diuretics, initial encounter: Secondary | ICD-10-CM

## 2023-12-22 DIAGNOSIS — R9431 Abnormal electrocardiogram [ECG] [EKG]: Secondary | ICD-10-CM | POA: Diagnosis not present

## 2023-12-22 DIAGNOSIS — E876 Hypokalemia: Secondary | ICD-10-CM

## 2023-12-22 DIAGNOSIS — E785 Hyperlipidemia, unspecified: Secondary | ICD-10-CM

## 2023-12-22 DIAGNOSIS — Z7985 Long-term (current) use of injectable non-insulin antidiabetic drugs: Secondary | ICD-10-CM

## 2023-12-22 DIAGNOSIS — Z23 Encounter for immunization: Secondary | ICD-10-CM

## 2023-12-22 DIAGNOSIS — Z0000 Encounter for general adult medical examination without abnormal findings: Secondary | ICD-10-CM | POA: Diagnosis not present

## 2023-12-22 DIAGNOSIS — Z7689 Persons encountering health services in other specified circumstances: Secondary | ICD-10-CM | POA: Diagnosis not present

## 2023-12-22 DIAGNOSIS — Z0001 Encounter for general adult medical examination with abnormal findings: Secondary | ICD-10-CM

## 2023-12-22 LAB — CBC WITH DIFFERENTIAL/PLATELET
Basophils Absolute: 0.1 10*3/uL (ref 0.0–0.1)
Basophils Relative: 0.8 % (ref 0.0–3.0)
Eosinophils Absolute: 0.1 10*3/uL (ref 0.0–0.7)
Eosinophils Relative: 1.2 % (ref 0.0–5.0)
HCT: 39.6 % (ref 36.0–46.0)
Hemoglobin: 12.9 g/dL (ref 12.0–15.0)
Lymphocytes Relative: 34.8 % (ref 12.0–46.0)
Lymphs Abs: 2.8 10*3/uL (ref 0.7–4.0)
MCHC: 32.6 g/dL (ref 30.0–36.0)
MCV: 86.5 fl (ref 78.0–100.0)
Monocytes Absolute: 0.4 10*3/uL (ref 0.1–1.0)
Monocytes Relative: 5.2 % (ref 3.0–12.0)
Neutro Abs: 4.7 10*3/uL (ref 1.4–7.7)
Neutrophils Relative %: 58 % (ref 43.0–77.0)
Platelets: 316 10*3/uL (ref 150.0–400.0)
RBC: 4.58 Mil/uL (ref 3.87–5.11)
RDW: 14.4 % (ref 11.5–15.5)
WBC: 8.1 10*3/uL (ref 4.0–10.5)

## 2023-12-22 LAB — URINALYSIS, ROUTINE W REFLEX MICROSCOPIC
Bilirubin Urine: NEGATIVE
Hgb urine dipstick: NEGATIVE
Ketones, ur: NEGATIVE
Leukocytes,Ua: NEGATIVE
Nitrite: NEGATIVE
Specific Gravity, Urine: 1.015 (ref 1.000–1.030)
Total Protein, Urine: NEGATIVE
Urine Glucose: NEGATIVE
Urobilinogen, UA: 0.2 (ref 0.0–1.0)
pH: 6.5 (ref 5.0–8.0)

## 2023-12-22 LAB — HEMOGLOBIN A1C: Hgb A1c MFr Bld: 6.3 % (ref 4.6–6.5)

## 2023-12-22 LAB — BASIC METABOLIC PANEL WITH GFR
BUN: 9 mg/dL (ref 6–23)
CO2: 31 meq/L (ref 19–32)
Calcium: 10.1 mg/dL (ref 8.4–10.5)
Chloride: 98 meq/L (ref 96–112)
Creatinine, Ser: 0.89 mg/dL (ref 0.40–1.20)
GFR: 72.83 mL/min (ref >60.00)
Glucose, Bld: 101 mg/dL — ABNORMAL HIGH (ref 70–99)
Potassium: 3.3 meq/L — ABNORMAL LOW (ref 3.5–5.1)
Sodium: 141 meq/L (ref 135–145)

## 2023-12-22 LAB — MICROALBUMIN / CREATININE URINE RATIO
Creatinine,U: 147.5 mg/dL
Microalb Creat Ratio: UNDETERMINED mg/g (ref 0.0–30.0)
Microalb, Ur: <0.7 mg/dL

## 2023-12-22 LAB — HEPATIC FUNCTION PANEL
ALT: 14 U/L (ref 0–35)
AST: 16 U/L (ref 0–37)
Albumin: 4.2 g/dL (ref 3.5–5.2)
Alkaline Phosphatase: 79 U/L (ref 39–117)
Bilirubin, Direct: 0 mg/dL (ref 0.0–0.3)
Total Bilirubin: 0.4 mg/dL (ref 0.2–1.2)
Total Protein: 8.2 g/dL (ref 6.0–8.3)

## 2023-12-22 MED ORDER — ROSUVASTATIN CALCIUM 20 MG PO TABS: 20.0000 mg | ORAL_TABLET | Freq: Every day | ORAL | 1 refills | Status: DC

## 2023-12-22 MED ORDER — SHINGRIX 50 MCG/0.5ML IM SUSR: 0.5000 mL | Freq: Once | INTRAMUSCULAR | 1 refills | Status: AC

## 2023-12-22 MED ORDER — SPIRONOLACTONE 50 MG PO TABS: 50.0000 mg | ORAL_TABLET | Freq: Every day | ORAL | 0 refills | Status: DC

## 2023-12-22 MED ORDER — MOUNJARO 7.5 MG/0.5ML ~~LOC~~ SOAJ: 7.5000 mg | SUBCUTANEOUS | 0 refills | Status: DC

## 2023-12-22 NOTE — Progress Notes (Signed)
 Subjective:  Patient ID: Jessica Davenport, female    DOB: 14-Sep-1965  Age: 58 y.o. MRN: [id number]  CC: Medical Management of Chronic Issues (Overdue 4 month follow up ), Annual Exam, Hypertension, Hyperlipidemia, and Diabetes   HPI [name] presents for a CPX and f/up ---  Discussed the use of AI scribe software for clinical note transcription with the patient, who gave verbal consent to proceed.  History of Present Illness   Jessica Davenport is a 58 year old female who presents with headache and swelling in the legs.  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 The nerve damage is reportedly starting to relax, and there is also damage in the neck area. No chest pain or shortness of breath.  She experiences swelling in her legs and feet, for which she takes a diuretic as needed to manage the fluid retention. Her legs swell occasionally, requiring her to manage fluid retention.  She is currently not working due to previous injuries and surgeries that did not heal as expected, leading to medical disability retirement. She is in the process of proving her disability status to receive retirement benefits from [facility].  She attends physical therapy in the evenings to improve her range of motion, particularly in her arms, and is undergoing dry needling for her neck. She has limited range of motion in her arms due to nerve damage, and physical therapy is helping her regain function.  No excessive thirst or urination, although she sometimes feels thirsty. She tries to drink water and avoid sweets, and her medication helps curb cravings. She ensures to eat breakfast to avoid taking medication on an empty stomach.  No bowel movement issues, such as constipation, diarrhea, or blood in the stool. She takes a stool softener if she feels she hasn't had enough bowel movements, typically going in the morning and evening.  For  pain management, she uses Tylenol  and occasionally meloxicam. She previously used Flexeril  but has run out and does not currently need it. She had surgery on her arm in the past and had masses removed from that area.       Outpatient Medications Prior to Visit  Medication Sig Dispense Refill   albuterol  (VENTOLIN  HFA) 108 (90 Base) MCG/ACT inhaler Inhale 1 puff into the lungs as needed. 18 g 5   cetirizine  (ZYRTEC  ALLERGY) 10 MG tablet Take 1 tablet (10 mg total) by mouth at bedtime. 30 tablet 0   clonazePAM  (KLONOPIN ) 1 MG tablet Take 1 tablet (1 mg total) by mouth 2 (two) times daily as needed for anxiety. 60 tablet 1   EPINEPHrine 0.3 mg/0.3 mL IJ SOAJ injection Inject 0.3 mg into the muscle as needed for anaphylaxis.     meloxicam (MOBIC) 15 MG tablet Take 1 tablet daily with food for 5-7 days, then as needed     omeprazole  (PRILOSEC ) 40 MG capsule TAKE ONE CAPSULE BY MOUTH TWICE A DAY 60 capsule 11   traMADol  (ULTRAM ) 50 MG tablet Take 1 tablet (50 mg total) by mouth every 6 (six) hours as needed. 60 tablet 0   traZODone  (DESYREL ) 50 MG tablet TAKE 1 TABLET BY MOUTH AT BEDTIME 90 tablet 1   rosuvastatin  (CRESTOR ) 20 MG tablet Take 1 tablet (20 mg total) by mouth daily. 90 tablet 1   tirzepatide  (MOUNJARO ) 5 MG/0.5ML Pen Inject 5 mg into the skin once a week. 2 mL 5   triamterene -hydrochlorothiazide (DYAZIDE) 37.5-25  MG capsule TAKE 1 CAPSULE BY MOUTH DAILY 30 capsule 0   albuterol  (VENTOLIN  HFA) 108 (90 Base) MCG/ACT inhaler Inhale 2 puffs into the lungs every 6 (six) hours as needed for wheezing or shortness of breath. 8 g 2   famotidine  (PEPCID ) 20 MG tablet Take 1 tablet (20 mg total) by mouth 2 (two) times daily for 7 days. 14 tablet 0   fluconazole  (DIFLUCAN ) 150 MG tablet Take one tablet at the onset of symptoms, if still having symptoms in 3 days, take the second tablet. 2 tablet 0   HYDROcodone  bit-homatropine (HYCODAN) 5-1.5 MG/5ML syrup Take 5 mLs by mouth every 8 (eight) hours as  needed for cough. 120 mL 0   No facility-administered medications prior to visit.    ROS Review of Systems  Constitutional:  Negative for appetite change, chills, diaphoresis, fatigue and fever.  HENT: Negative.    Eyes: Negative.   Respiratory: Negative.  Negative for cough, chest tightness, shortness of breath and wheezing.   Cardiovascular:  Positive for leg swelling. Negative for chest pain and palpitations.  Gastrointestinal: Negative.  Negative for abdominal pain, constipation, diarrhea, nausea and vomiting.  Genitourinary:  Negative for difficulty urinating.  Musculoskeletal:  Positive for arthralgias, back pain, gait problem and neck pain. Negative for joint swelling.  Neurological:  Negative for dizziness, seizures, syncope, weakness, numbness and headaches.  Hematological:  Negative for adenopathy. Does not bruise/bleed easily.  Psychiatric/Behavioral:  Negative for behavioral problems, confusion, decreased concentration, dysphoric mood, sleep disturbance and suicidal ideas. The patient is nervous/anxious.     Objective:  BP 132/80 (BP Location: Left Arm, Patient Position: Sitting, Cuff Size: Large)   Pulse 84   Temp 98.3 F (36.8 C) (Oral)   Resp 16   Ht 5' 5.5" (1.664 m)   Wt 294 lb 6.4 oz (133.5 kg)   LMP 11/10/2011   SpO2 99%   BMI 48.25 kg/m   BP Readings from Last 3 Encounters:  12/22/23 132/80  08/15/23 (!) 138/90  03/31/23 130/86    Wt Readings from Last 3 Encounters:  12/22/23 294 lb 6.4 oz (133.5 kg)  03/31/23 (!) 316 lb (143.3 kg)  03/16/23 (!) 316 lb (143.3 kg)    Physical Exam Vitals reviewed.  Constitutional:      Appearance: Normal appearance.  HENT:     Nose: Nose normal.     Mouth/Throat:     Mouth: Mucous membranes are moist.  Eyes:     General: No scleral icterus.    Conjunctiva/sclera: Conjunctivae normal.  Cardiovascular:     Rate and Rhythm: Normal rate and regular rhythm.     Heart sounds: No murmur heard.    No friction rub.  No gallop.     Comments: EKG--- SR with 1st degree AV block, 82 bpm TWI in anterior leads is not new  Low voltage in III is new No Q waves  Pulmonary:     Effort: Pulmonary effort is normal.     Breath sounds: No stridor. No wheezing, rhonchi or rales.  Abdominal:     General: Abdomen is flat.     Palpations: There is no mass.     Tenderness: There is no abdominal tenderness. There is no guarding.     Hernia: No hernia is present.  Musculoskeletal:        General: Normal range of motion.     Cervical back: Neck supple.     Right lower leg: No edema.     Left lower leg:  No edema.  Lymphadenopathy:     Cervical: No cervical adenopathy.  Skin:    General: Skin is warm and dry.  Neurological:     General: No focal deficit present.     Mental Status: She is alert. Mental status is at baseline.  Psychiatric:        Mood and Affect: Mood normal.        Behavior: Behavior normal.     Lab Results  Component Value Date   WBC 8.1 12/22/2023   HGB 12.9 12/22/2023   HCT 39.6 12/22/2023   PLT 316.0 12/22/2023   GLUCOSE 101 (H) 12/22/2023   CHOL 231 (H) 03/31/2023   TRIG 187.0 (H) 03/31/2023   HDL 54.50 03/31/2023   LDLDIRECT 148.1 06/16/2012   LDLCALC 139 (H) 03/31/2023   ALT 14 12/22/2023   AST 16 12/22/2023   NA 141 12/22/2023   K 3.3 (L) 12/22/2023   CL 98 12/22/2023   CREATININE 0.89 12/22/2023   BUN 9 12/22/2023   CO2 31 12/22/2023   TSH 1.39 03/31/2023   HGBA1C 6.3 12/22/2023   MICROALBUR <0.7 12/22/2023    DG Shoulder Left Result Date: 03/30/2023 CLINICAL DATA:  Left shoulder pain EXAM: LEFT SHOULDER - 3 VIEW COMPARISON:  None Available. FINDINGS: Axillary view of the shoulder does not include the glenoid. There is no evidence of fracture or dislocation. There is no evidence of arthropathy or other focal bone abnormality. Soft tissues are unremarkable. IMPRESSION: 1. No acute fracture or dislocation. 2. Suboptimal axillary view of the shoulder, which does not  include the glenoid. Electronically Signed   By: Limin  Xu M.D.   On: 03/30/2023 16:48    Assessment & Plan:  Accelerated hypertension- K+ remains low. Will switch Dyazide to spironolactone . -     EKG 12-Lead -     Basic metabolic panel with GFR; Future -     CBC with Differential/Platelet; Future -     Urinalysis, Routine w reflex microscopic; Future -     Hepatic function panel; Future -     Spironolactone ; Take 1 tablet (50 mg total) by mouth daily.  Dispense: 90 tablet; Refill: 0 -     AMB Referral VBCI Care Management  Type 2 diabetes mellitus with other specified complication, without long-term current use of insulin (HCC)- Her blood sugar is well controlled. -     Basic metabolic panel with GFR; Future -     Urinalysis, Routine w reflex microscopic; Future -     Hemoglobin A1c; Future -     Microalbumin / creatinine urine ratio; Future -     Mounjaro ; Inject 7.5 mg into the skin once a week.  Dispense: 6 mL; Refill: 0  Dyslipidemia, goal LDL below 130 -     Hepatic function panel; Future -     Rosuvastatin  Calcium ; Take 1 tablet (20 mg total) by mouth daily.  Dispense: 90 tablet; Refill: 1  Immunization due -     Pneumococcal conjugate vaccine 20-valent  Need for prophylactic vaccination and inoculation against varicella -     Shingrix ; Inject 0.5 mLs into the muscle once for 1 dose.  Dispense: 0.5 mL; Refill: 1  Encounter for general adult medical examination with abnormal findings- Exam completed, labs reviewed, vaccines reviewed and updated, cancer screenings are UTD, pt ed material was given.   Diuretic-induced hypokalemia -     Spironolactone ; Take 1 tablet (50 mg total) by mouth daily.  Dispense: 90 tablet; Refill: 0 -  AMB Referral VBCI Care Management     Follow-up: Return in about 6 months (around 06/22/2024).  Sandra Crouch, MD

## 2023-12-22 NOTE — Patient Instructions (Signed)

## 2023-12-23 ENCOUNTER — Telehealth: Payer: Self-pay

## 2023-12-23 ENCOUNTER — Telehealth: Payer: Self-pay | Admitting: *Deleted

## 2023-12-23 ENCOUNTER — Other Ambulatory Visit (HOSPITAL_COMMUNITY): Payer: Self-pay

## 2023-12-23 DIAGNOSIS — M542 Cervicalgia: Secondary | ICD-10-CM | POA: Diagnosis not present

## 2023-12-23 DIAGNOSIS — Z7689 Persons encountering health services in other specified circumstances: Secondary | ICD-10-CM | POA: Diagnosis not present

## 2023-12-23 NOTE — Telephone Encounter (Signed)
 Patient has been made aware.

## 2023-12-23 NOTE — Telephone Encounter (Signed)
 Pharmacy Patient Advocate Encounter  Received notification from [facility] MEDICAID that Prior Authorization for Mounjaro  7.5 has been APPROVED from 12/23/23 to 12/22/24. Ran test claim, Copay is $25.00. This test claim was processed through [facility]- copay amounts may vary at other pharmacies due to pharmacy/plan contracts, or as the patient moves through the different stages of their insurance plan.   PA #/Case ID/Reference #: [id number]

## 2023-12-23 NOTE — Progress Notes (Signed)
 Care Guide Pharmacy Note  12/23/2023 Name: Jessica Davenport MRN: [id number] DOB: Jan 24, 1966  Referred By: Arcadio Knuckles, MD Reason for referral: Complex Care Management and Call Attempt #1 (Outreach to schedule referral with pharmacist )   Jessica Davenport is a 58 y.o. year old female who is a primary care patient of Arcadio Knuckles, MD.  Jessica Davenport was referred to the pharmacist for assistance related to: HTN  Successful contact was made with the patient to discuss pharmacy services including being ready for the pharmacist to call at least 5 minutes before the scheduled appointment time and to have medication bottles and any blood pressure readings ready for review. The patient agreed to meet with the pharmacist via telephone visit on 01/02/2024  Jessica Davenport, CMA [place]  [facility], [facility] Guide Direct Dial: [phone]  Fax: [phone] Website: [email or web address]

## 2023-12-23 NOTE — Telephone Encounter (Signed)
 Pharmacy Patient Advocate Encounter   Received notification from CoverMyMeds that prior authorization for Mounjaro  7.5 is required/requested.   Insurance verification completed.   The patient is insured through [facility] MEDICAID .   Per test claim: PA required; PA submitted to above mentioned insurance via CoverMyMeds Key/confirmation #/EOC [id number] Status is pending

## 2023-12-23 NOTE — Progress Notes (Signed)
 Care Guide Pharmacy Note  12/23/2023 Name: Jessica Davenport MRN: [id number] DOB: [birth year]  Referred By: Arcadio Knuckles, MD Reason for referral: Complex Care Management and Call Attempt #1 (Outreach to schedule referral with pharmacist )   Jessica Davenport is a 58 y.o. year old female who is a primary care patient of Arcadio Knuckles, MD.  Jessica Davenport was referred to the pharmacist for assistance related to: HTN  An unsuccessful telephone outreach was attempted today to contact the patient who was referred to the pharmacy team for assistance with medication management. Additional attempts will be made to contact the patient.  Kandis Ormond, CMA [place]  [facility], [facility] Guide Direct Dial: [phone]  Fax: [phone] Website: [email or web address]

## 2023-12-23 NOTE — Telephone Encounter (Signed)
 Patient has been made aware gave a verbal understanding.

## 2023-12-24 ENCOUNTER — Ambulatory Visit: Payer: Self-pay | Admitting: Internal Medicine

## 2023-12-24 DIAGNOSIS — Z7689 Persons encountering health services in other specified circumstances: Secondary | ICD-10-CM | POA: Diagnosis not present

## 2023-12-24 DIAGNOSIS — R9431 Abnormal electrocardiogram [ECG] [EKG]: Secondary | ICD-10-CM | POA: Insufficient documentation

## 2023-12-26 DIAGNOSIS — M542 Cervicalgia: Secondary | ICD-10-CM | POA: Diagnosis not present

## 2023-12-26 DIAGNOSIS — Z7689 Persons encountering health services in other specified circumstances: Secondary | ICD-10-CM | POA: Diagnosis not present

## 2023-12-26 DIAGNOSIS — M5412 Radiculopathy, cervical region: Secondary | ICD-10-CM | POA: Diagnosis not present

## 2023-12-29 DIAGNOSIS — M542 Cervicalgia: Secondary | ICD-10-CM | POA: Diagnosis not present

## 2023-12-29 DIAGNOSIS — Z419 Encounter for procedure for purposes other than remedying health state, unspecified: Secondary | ICD-10-CM | POA: Diagnosis not present

## 2024-01-02 ENCOUNTER — Other Ambulatory Visit: Admitting: Pharmacist

## 2024-01-02 DIAGNOSIS — M5412 Radiculopathy, cervical region: Secondary | ICD-10-CM | POA: Diagnosis not present

## 2024-01-02 DIAGNOSIS — E876 Hypokalemia: Secondary | ICD-10-CM

## 2024-01-02 DIAGNOSIS — Z7689 Persons encountering health services in other specified circumstances: Secondary | ICD-10-CM | POA: Diagnosis not present

## 2024-01-02 DIAGNOSIS — I10 Essential (primary) hypertension: Secondary | ICD-10-CM

## 2024-01-02 NOTE — Patient Instructions (Signed)
 It was a pleasure speaking with you today!  Take spironolactone  25 mg daily for blood pressure. Stop triamterene /hydrochlorothiazide.  You can continue using furosemide  40 mg as needed for leg swelling.  Come by the lab at [facility] any day the week of 7/7 for us  to recheck your potassium.  Feel free to call with any questions or concerns!  Rainelle Bur, PharmD, BCPS, CPP Clinical Pharmacist Practitioner  Primary Care at [facility] [phone]

## 2024-01-02 NOTE — Progress Notes (Signed)
 01/02/2024 Name: Jessica Davenport MRN: [id number] DOB: Aug 21, 1965  Chief Complaint  Patient presents with   Hypertension   Medication Management   Hypokalemia    Jessica Davenport is a 58 y.o. year old female who presented for a telephone visit.   They were referred to the pharmacist by their PCP for assistance in managing hypertension/hypokalemia   Subjective:  Care Team: Primary Care Provider: Arcadio Knuckles, MD ; Next Scheduled Visit: none scheduled   Medication Access/Adherence  Current Pharmacy:  [facility] [id number] - [address] [place] - [address] [facility] [address] [facility] [address] [address] [place] [address] Phone: [phone] Fax: [phone]  [id number] - [place]ette Nestle, [place] - [address] AT [facility] OF [address] [place] [address] Phone: [phone] Fax: [phone]   Patient reports affordability concerns with their medications: No  Patient reports access/transportation concerns to their pharmacy: No  Patient reports adherence concerns with their medications:  No     Hypertension:  Current medications: furosemide  40 mg daily PRN (needed about 2x per week), triamterene /hydrochlorothiazide 37.5/25 mg daily, spironolactone  25 mg daily  *Pt notes she was told to take spironolactone  and triamterene /hydrochlorothiazide so she has been taking both for about 1.5 weeks. She also takes furosemide  40 mg PRN from cardio for leg swelling. She reports taking this usually 2x per week.  Patient reports hypotensive s/sx including dizziness, lightheadedness.     Objective:  Lab Results  Component Value Date   HGBA1C 6.3 12/22/2023    Lab Results  Component Value Date   CREATININE 0.89 12/22/2023   BUN 9 12/22/2023   NA 141 12/22/2023   K 3.3 (L) 12/22/2023   CL 98 12/22/2023   CO2 31 12/22/2023    Lab Results  Component Value Date   CHOL 231 (H) 03/31/2023   HDL 54.50 03/31/2023   LDLCALC  139 (H) 03/31/2023   LDLDIRECT 148.1 06/16/2012   TRIG 187.0 (H) 03/31/2023   CHOLHDL 4 03/31/2023    Medications Reviewed Today     Reviewed by Dion Frankel, RPH (Pharmacist) on 01/02/24 at 1549  Med List Status: <None>   Medication Order Taking? Sig Documenting Provider Last Dose Status Informant  albuterol  (VENTOLIN  HFA) 108 (90 Base) MCG/ACT inhaler [id number]  Inhale 1 puff into the lungs as needed. Colene Dauphin, MD  Active   cetirizine  (ZYRTEC  ALLERGY) 10 MG tablet [id number]  Take 1 tablet (10 mg total) by mouth at bedtime. Mardene Shake, FNP  Expired 12/22/23 2359   clonazePAM  (KLONOPIN ) 1 MG tablet [id number]  Take 1 tablet (1 mg total) by mouth 2 (two) times daily as needed for anxiety. Roslyn Coombe, MD  Active   EPINEPHrine 0.3 mg/0.3 mL IJ SOAJ injection [id number]  Inject 0.3 mg into the muscle as needed for anaphylaxis. [provider]  Active Self           Med Note (DAVIS, SOPHIA A   Thu Sep 23, 2022  9:30 AM) On hand  meloxicam (MOBIC) 15 MG tablet [id number]  Take 1 tablet daily with food for 5-7 days, then as needed [provider]  Active   omeprazole  (PRILOSEC ) 40 MG capsule [id number]  TAKE ONE CAPSULE BY MOUTH TWICE A DAY Beavers, Kimberly, MD  Active   rosuvastatin  (CRESTOR ) 20 MG tablet [id number] Yes Take 1 tablet (20 mg total) by mouth daily. Arcadio Knuckles, MD  Active   spironolactone  (ALDACTONE ) 50 MG tablet  [id number] Yes Take 1 tablet (50 mg total) by mouth daily. Arcadio Knuckles, MD  Active   tirzepatide  (MOUNJARO ) 7.5 MG/0.5ML Pen [id number] Yes Inject 7.5 mg into the skin once a week. Arcadio Knuckles, MD  Active   traMADol  (ULTRAM ) 50 MG tablet [id number]  Take 1 tablet (50 mg total) by mouth every 6 (six) hours as needed. Roslyn Coombe, MD  Active   traZODone  (DESYREL ) 50 MG tablet [id number]  TAKE 1 TABLET BY MOUTH AT BEDTIME Arcadio Knuckles, MD  Active   triamterene -hydrochlorothiazide (DYAZIDE) 37.5-25 MG capsule [id number] Yes Take 1  capsule by mouth daily. [provider]  Active              Assessment/Plan:   Hypertension/Hypokalemia: - Currently controlled, BP goal <130/80 - Recommend to stop triamterene /hydrochlorothiazide. Continue spironolactone  and furosemide  PRN - BMP in 3 weeks to check potassium     Follow Up Plan: 7/7 labs  Rainelle Bur, PharmD, BCPS, CPP Clinical Pharmacist Practitioner [facility] [phone]

## 2024-01-03 DIAGNOSIS — M25511 Pain in right shoulder: Secondary | ICD-10-CM | POA: Diagnosis not present

## 2024-01-03 DIAGNOSIS — M25512 Pain in left shoulder: Secondary | ICD-10-CM | POA: Diagnosis not present

## 2024-01-03 DIAGNOSIS — Z7689 Persons encountering health services in other specified circumstances: Secondary | ICD-10-CM | POA: Diagnosis not present

## 2024-01-04 DIAGNOSIS — M542 Cervicalgia: Secondary | ICD-10-CM | POA: Diagnosis not present

## 2024-01-04 DIAGNOSIS — Z7689 Persons encountering health services in other specified circumstances: Secondary | ICD-10-CM | POA: Diagnosis not present

## 2024-01-04 DIAGNOSIS — M5412 Radiculopathy, cervical region: Secondary | ICD-10-CM | POA: Diagnosis not present

## 2024-01-06 ENCOUNTER — Telehealth: Payer: Self-pay

## 2024-01-06 DIAGNOSIS — M25511 Pain in right shoulder: Secondary | ICD-10-CM | POA: Diagnosis not present

## 2024-01-06 DIAGNOSIS — Z7689 Persons encountering health services in other specified circumstances: Secondary | ICD-10-CM | POA: Diagnosis not present

## 2024-01-06 DIAGNOSIS — M25512 Pain in left shoulder: Secondary | ICD-10-CM | POA: Diagnosis not present

## 2024-01-06 NOTE — Telephone Encounter (Signed)
 Pharmacy Patient Advocate Encounter   Received notification from CoverMyMeds that prior authorization for Mounjaro  5MG /0.5ML auto-injectors is required/requested.   Insurance verification completed.   The patient is insured through [facility] .   Per test claim: PA required; PA submitted to above mentioned insurance via CoverMyMeds Key/confirmation #/EOC [id number] Status is pending

## 2024-01-09 DIAGNOSIS — M25811 Other specified joint disorders, right shoulder: Secondary | ICD-10-CM | POA: Diagnosis not present

## 2024-01-09 DIAGNOSIS — M5412 Radiculopathy, cervical region: Secondary | ICD-10-CM | POA: Diagnosis not present

## 2024-01-09 DIAGNOSIS — Z7689 Persons encountering health services in other specified circumstances: Secondary | ICD-10-CM | POA: Diagnosis not present

## 2024-01-09 DIAGNOSIS — M25812 Other specified joint disorders, left shoulder: Secondary | ICD-10-CM | POA: Diagnosis not present

## 2024-01-09 DIAGNOSIS — M542 Cervicalgia: Secondary | ICD-10-CM | POA: Diagnosis not present

## 2024-01-10 ENCOUNTER — Ambulatory Visit: Payer: Self-pay | Admitting: *Deleted

## 2024-01-10 DIAGNOSIS — M25532 Pain in left wrist: Secondary | ICD-10-CM | POA: Diagnosis not present

## 2024-01-10 DIAGNOSIS — M25522 Pain in left elbow: Secondary | ICD-10-CM | POA: Diagnosis not present

## 2024-01-10 DIAGNOSIS — M25531 Pain in right wrist: Secondary | ICD-10-CM | POA: Diagnosis not present

## 2024-01-10 DIAGNOSIS — Z7689 Persons encountering health services in other specified circumstances: Secondary | ICD-10-CM | POA: Diagnosis not present

## 2024-01-10 DIAGNOSIS — I10 Essential (primary) hypertension: Secondary | ICD-10-CM | POA: Diagnosis not present

## 2024-01-10 DIAGNOSIS — M25521 Pain in right elbow: Secondary | ICD-10-CM | POA: Diagnosis not present

## 2024-01-10 DIAGNOSIS — Q211 Atrial septal defect, unspecified: Secondary | ICD-10-CM | POA: Diagnosis not present

## 2024-01-10 NOTE — Telephone Encounter (Signed)
 FYI Only or Action Required?: FYI only for provider.  Patient was last seen in primary care on 12/22/2023 by Joshua Debby CROME, MD. Called Nurse Triage reporting nose bleeds. Symptoms began today. Interventions attempted: Other: patient states this bleeding is further back in sinus- not bleeding out of nose- can fel it in the throat. Symptoms are: gradually improving.  Triage Disposition: Home Care  Patient/caregiver understands and will follow disposition?: yes- patient is going to another appointment now- will have BP checked- may need PCP follow up for this since blood not actually coming out of nose.   Reason for Disposition  [1] Nosebleed AND [2] bleeding present < 30 minutes AND [3] using correct technique per guideline  Answer Assessment - Initial Assessment Questions 1. AMOUNT OF BLEEDING: How bad is the bleeding? How much blood was lost? Has the bleeding stopped?   - MILD: needed a couple tissues   - MODERATE: needed many tissues   - SEVERE: large blood clots, soaked many tissues, lasted more than 30 minutes      moderate 2. ONSET: When did the nosebleed start?      today 3. FREQUENCY: How many nosebleeds have you had in the last 24 hours?      Only today 4. RECURRENT SYMPTOMS: Have there been other recent nosebleeds? If Yes, ask: How long did it take you to stop the bleeding? What worked best?      Yes- couple days before last PCP appointment- it was heavier 5. CAUSE: What do you think caused this nosebleed?     No idea 6. LOCAL FACTORS: Do you have any cold symptoms?, Have you been rubbing or picking at your nose?     no 7. SYSTEMIC FACTORS: Do you have high blood pressure or any bleeding problems?     Not sure- patient is leaving Ortho and on her way to [facility] for appointment- they will check it there 8. BLOOD THINNERS: Do you take any blood thinners? (e.g., aspirin, clopidogrel / Plavix, coumadin, heparin). Notes: Other strong blood thinners include:  Arixtra (fondaparinux), Eliquis (apixaban), Pradaxa (dabigatran), and Xarelto (rivaroxaban).     no 9. OTHER SYMPTOMS: Do you have any other symptoms? (e.g., lightheadedness)     No   Reviewed care advice- patient aware for when to seek acre and call back for assistance  Protocols used: Nosebleed-A-AH    Copied from CRM [id number]. Topic: Clinical - Red Word Triage >> Jan 10, 2024 12:41 PM Lavanda D wrote: Red Word that prompted transfer to Nurse Triage: Patient has been experiencing nose bleeds and the blood has been draining down throat. 2nd time this has been occuring. It has been going on for about 5 minutes now, moderate bleeding.

## 2024-01-11 DIAGNOSIS — M5412 Radiculopathy, cervical region: Secondary | ICD-10-CM | POA: Diagnosis not present

## 2024-01-11 DIAGNOSIS — Z7689 Persons encountering health services in other specified circumstances: Secondary | ICD-10-CM | POA: Diagnosis not present

## 2024-01-12 DIAGNOSIS — M25511 Pain in right shoulder: Secondary | ICD-10-CM | POA: Diagnosis not present

## 2024-01-12 DIAGNOSIS — Z7689 Persons encountering health services in other specified circumstances: Secondary | ICD-10-CM | POA: Diagnosis not present

## 2024-01-12 DIAGNOSIS — M25512 Pain in left shoulder: Secondary | ICD-10-CM | POA: Diagnosis not present

## 2024-01-13 NOTE — Telephone Encounter (Signed)
 Patient wants to know if she needs an appointment for this ?

## 2024-01-16 DIAGNOSIS — M25512 Pain in left shoulder: Secondary | ICD-10-CM | POA: Diagnosis not present

## 2024-01-16 DIAGNOSIS — Z7689 Persons encountering health services in other specified circumstances: Secondary | ICD-10-CM | POA: Diagnosis not present

## 2024-01-16 NOTE — Telephone Encounter (Signed)
 Patient has been made aware and she gave a verbal understanding.

## 2024-01-16 NOTE — Telephone Encounter (Signed)
 If the nosebleeds persist I will refer to ENDO

## 2024-01-17 DIAGNOSIS — M25521 Pain in right elbow: Secondary | ICD-10-CM | POA: Diagnosis not present

## 2024-01-17 DIAGNOSIS — Z7689 Persons encountering health services in other specified circumstances: Secondary | ICD-10-CM | POA: Diagnosis not present

## 2024-01-17 DIAGNOSIS — M5412 Radiculopathy, cervical region: Secondary | ICD-10-CM | POA: Diagnosis not present

## 2024-01-18 DIAGNOSIS — M25512 Pain in left shoulder: Secondary | ICD-10-CM | POA: Diagnosis not present

## 2024-01-18 DIAGNOSIS — M25511 Pain in right shoulder: Secondary | ICD-10-CM | POA: Diagnosis not present

## 2024-01-18 DIAGNOSIS — Z7689 Persons encountering health services in other specified circumstances: Secondary | ICD-10-CM | POA: Diagnosis not present

## 2024-01-22 ENCOUNTER — Encounter: Payer: Self-pay | Admitting: Internal Medicine

## 2024-01-22 DIAGNOSIS — Z7689 Persons encountering health services in other specified circumstances: Secondary | ICD-10-CM | POA: Diagnosis not present

## 2024-01-23 DIAGNOSIS — M542 Cervicalgia: Secondary | ICD-10-CM | POA: Diagnosis not present

## 2024-01-23 DIAGNOSIS — Z7689 Persons encountering health services in other specified circumstances: Secondary | ICD-10-CM | POA: Diagnosis not present

## 2024-01-24 ENCOUNTER — Other Ambulatory Visit: Payer: Self-pay

## 2024-01-24 DIAGNOSIS — L509 Urticaria, unspecified: Secondary | ICD-10-CM

## 2024-01-24 DIAGNOSIS — M542 Cervicalgia: Secondary | ICD-10-CM | POA: Diagnosis not present

## 2024-01-24 DIAGNOSIS — G8929 Other chronic pain: Secondary | ICD-10-CM

## 2024-01-24 DIAGNOSIS — Z7689 Persons encountering health services in other specified circumstances: Secondary | ICD-10-CM | POA: Diagnosis not present

## 2024-01-24 MED ORDER — CETIRIZINE HCL 10 MG PO TABS: 10.0000 mg | ORAL_TABLET | Freq: Every day | ORAL | 1 refills | Status: AC

## 2024-01-24 MED ORDER — OMEPRAZOLE 40 MG PO CPDR: 40.0000 mg | DELAYED_RELEASE_CAPSULE | Freq: Two times a day (BID) | ORAL | 1 refills | Status: AC

## 2024-01-27 ENCOUNTER — Ambulatory Visit: Payer: Self-pay | Admitting: Pharmacist

## 2024-01-27 ENCOUNTER — Other Ambulatory Visit (INDEPENDENT_AMBULATORY_CARE_PROVIDER_SITE_OTHER)

## 2024-01-27 DIAGNOSIS — T502X5A Adverse effect of carbonic-anhydrase inhibitors, benzothiadiazides and other diuretics, initial encounter: Secondary | ICD-10-CM | POA: Diagnosis not present

## 2024-01-27 DIAGNOSIS — I10 Essential (primary) hypertension: Secondary | ICD-10-CM | POA: Diagnosis not present

## 2024-01-27 DIAGNOSIS — Z7689 Persons encountering health services in other specified circumstances: Secondary | ICD-10-CM | POA: Diagnosis not present

## 2024-01-27 DIAGNOSIS — E876 Hypokalemia: Secondary | ICD-10-CM | POA: Diagnosis not present

## 2024-01-27 LAB — BASIC METABOLIC PANEL WITH GFR
BUN: 12 mg/dL (ref 6–23)
CO2: 34 meq/L — ABNORMAL HIGH (ref 19–32)
Calcium: 9.7 mg/dL (ref 8.4–10.5)
Chloride: 101 meq/L (ref 96–112)
Creatinine, Ser: 0.86 mg/dL (ref 0.40–1.20)
GFR: 75.84 mL/min (ref >60.00)
Glucose, Bld: 97 mg/dL (ref 70–99)
Potassium: 3.6 meq/L (ref 3.5–5.1)
Sodium: 139 meq/L (ref 135–145)

## 2024-01-28 DIAGNOSIS — Z419 Encounter for procedure for purposes other than remedying health state, unspecified: Secondary | ICD-10-CM | POA: Diagnosis not present

## 2024-02-01 DIAGNOSIS — M25512 Pain in left shoulder: Secondary | ICD-10-CM | POA: Diagnosis not present

## 2024-02-01 DIAGNOSIS — M25511 Pain in right shoulder: Secondary | ICD-10-CM | POA: Diagnosis not present

## 2024-02-01 DIAGNOSIS — Z7689 Persons encountering health services in other specified circumstances: Secondary | ICD-10-CM | POA: Diagnosis not present

## 2024-02-02 ENCOUNTER — Other Ambulatory Visit (HOSPITAL_COMMUNITY): Payer: Self-pay

## 2024-02-02 NOTE — Telephone Encounter (Signed)
 PA RESOLVED. NEXT REFILL DATE 02/09/2024

## 2024-02-03 DIAGNOSIS — Z7689 Persons encountering health services in other specified circumstances: Secondary | ICD-10-CM | POA: Diagnosis not present

## 2024-02-03 DIAGNOSIS — M5412 Radiculopathy, cervical region: Secondary | ICD-10-CM | POA: Diagnosis not present

## 2024-02-07 DIAGNOSIS — Z7689 Persons encountering health services in other specified circumstances: Secondary | ICD-10-CM | POA: Diagnosis not present

## 2024-02-07 DIAGNOSIS — M542 Cervicalgia: Secondary | ICD-10-CM | POA: Diagnosis not present

## 2024-02-07 DIAGNOSIS — M5412 Radiculopathy, cervical region: Secondary | ICD-10-CM | POA: Diagnosis not present

## 2024-02-09 DIAGNOSIS — M542 Cervicalgia: Secondary | ICD-10-CM | POA: Diagnosis not present

## 2024-02-09 DIAGNOSIS — Z7689 Persons encountering health services in other specified circumstances: Secondary | ICD-10-CM | POA: Diagnosis not present

## 2024-02-09 DIAGNOSIS — M25512 Pain in left shoulder: Secondary | ICD-10-CM | POA: Diagnosis not present

## 2024-02-09 DIAGNOSIS — M25511 Pain in right shoulder: Secondary | ICD-10-CM | POA: Diagnosis not present

## 2024-02-10 ENCOUNTER — Other Ambulatory Visit: Payer: Self-pay | Admitting: Internal Medicine

## 2024-02-10 DIAGNOSIS — T502X5A Adverse effect of carbonic-anhydrase inhibitors, benzothiadiazides and other diuretics, initial encounter: Secondary | ICD-10-CM

## 2024-02-10 DIAGNOSIS — I10 Essential (primary) hypertension: Secondary | ICD-10-CM

## 2024-02-14 DIAGNOSIS — Z7689 Persons encountering health services in other specified circumstances: Secondary | ICD-10-CM | POA: Diagnosis not present

## 2024-02-15 ENCOUNTER — Other Ambulatory Visit (HOSPITAL_COMMUNITY): Payer: Self-pay

## 2024-02-15 DIAGNOSIS — Z7689 Persons encountering health services in other specified circumstances: Secondary | ICD-10-CM | POA: Diagnosis not present

## 2024-02-15 DIAGNOSIS — M25521 Pain in right elbow: Secondary | ICD-10-CM | POA: Diagnosis not present

## 2024-02-17 DIAGNOSIS — M25522 Pain in left elbow: Secondary | ICD-10-CM | POA: Diagnosis not present

## 2024-02-17 DIAGNOSIS — M25531 Pain in right wrist: Secondary | ICD-10-CM | POA: Diagnosis not present

## 2024-02-17 DIAGNOSIS — M25532 Pain in left wrist: Secondary | ICD-10-CM | POA: Diagnosis not present

## 2024-02-17 DIAGNOSIS — Z7689 Persons encountering health services in other specified circumstances: Secondary | ICD-10-CM | POA: Diagnosis not present

## 2024-02-17 DIAGNOSIS — M25521 Pain in right elbow: Secondary | ICD-10-CM | POA: Diagnosis not present

## 2024-02-19 DIAGNOSIS — Z7689 Persons encountering health services in other specified circumstances: Secondary | ICD-10-CM | POA: Diagnosis not present

## 2024-02-21 DIAGNOSIS — M25512 Pain in left shoulder: Secondary | ICD-10-CM | POA: Diagnosis not present

## 2024-02-21 DIAGNOSIS — Z7689 Persons encountering health services in other specified circumstances: Secondary | ICD-10-CM | POA: Diagnosis not present

## 2024-02-21 DIAGNOSIS — M25511 Pain in right shoulder: Secondary | ICD-10-CM | POA: Diagnosis not present

## 2024-02-22 DIAGNOSIS — M25521 Pain in right elbow: Secondary | ICD-10-CM | POA: Diagnosis not present

## 2024-02-22 DIAGNOSIS — Z7689 Persons encountering health services in other specified circumstances: Secondary | ICD-10-CM | POA: Diagnosis not present

## 2024-02-23 DIAGNOSIS — Z7689 Persons encountering health services in other specified circumstances: Secondary | ICD-10-CM | POA: Diagnosis not present

## 2024-02-23 DIAGNOSIS — M25511 Pain in right shoulder: Secondary | ICD-10-CM | POA: Diagnosis not present

## 2024-02-23 DIAGNOSIS — M25512 Pain in left shoulder: Secondary | ICD-10-CM | POA: Diagnosis not present

## 2024-02-28 DIAGNOSIS — Z419 Encounter for procedure for purposes other than remedying health state, unspecified: Secondary | ICD-10-CM | POA: Diagnosis not present

## 2024-02-29 DIAGNOSIS — M25511 Pain in right shoulder: Secondary | ICD-10-CM | POA: Diagnosis not present

## 2024-02-29 DIAGNOSIS — Z7689 Persons encountering health services in other specified circumstances: Secondary | ICD-10-CM | POA: Diagnosis not present

## 2024-02-29 DIAGNOSIS — M25521 Pain in right elbow: Secondary | ICD-10-CM | POA: Diagnosis not present

## 2024-02-29 DIAGNOSIS — M25512 Pain in left shoulder: Secondary | ICD-10-CM | POA: Diagnosis not present

## 2024-03-02 DIAGNOSIS — M25512 Pain in left shoulder: Secondary | ICD-10-CM | POA: Diagnosis not present

## 2024-03-02 DIAGNOSIS — Z7689 Persons encountering health services in other specified circumstances: Secondary | ICD-10-CM | POA: Diagnosis not present

## 2024-03-02 DIAGNOSIS — M25511 Pain in right shoulder: Secondary | ICD-10-CM | POA: Diagnosis not present

## 2024-03-06 DIAGNOSIS — Z7689 Persons encountering health services in other specified circumstances: Secondary | ICD-10-CM | POA: Diagnosis not present

## 2024-03-06 DIAGNOSIS — M5382 Other specified dorsopathies, cervical region: Secondary | ICD-10-CM | POA: Diagnosis not present

## 2024-03-06 DIAGNOSIS — M5412 Radiculopathy, cervical region: Secondary | ICD-10-CM | POA: Diagnosis not present

## 2024-03-08 DIAGNOSIS — M25621 Stiffness of right elbow, not elsewhere classified: Secondary | ICD-10-CM | POA: Diagnosis not present

## 2024-03-08 DIAGNOSIS — Z7689 Persons encountering health services in other specified circumstances: Secondary | ICD-10-CM | POA: Diagnosis not present

## 2024-03-12 ENCOUNTER — Other Ambulatory Visit (HOSPITAL_COMMUNITY): Payer: Self-pay

## 2024-03-12 ENCOUNTER — Other Ambulatory Visit: Payer: Self-pay | Admitting: Internal Medicine

## 2024-03-12 DIAGNOSIS — E1169 Type 2 diabetes mellitus with other specified complication: Secondary | ICD-10-CM

## 2024-03-12 DIAGNOSIS — M25521 Pain in right elbow: Secondary | ICD-10-CM | POA: Diagnosis not present

## 2024-03-12 DIAGNOSIS — Z7689 Persons encountering health services in other specified circumstances: Secondary | ICD-10-CM | POA: Diagnosis not present

## 2024-03-13 DIAGNOSIS — Z7689 Persons encountering health services in other specified circumstances: Secondary | ICD-10-CM | POA: Diagnosis not present

## 2024-03-15 DIAGNOSIS — Z7689 Persons encountering health services in other specified circumstances: Secondary | ICD-10-CM | POA: Diagnosis not present

## 2024-03-15 DIAGNOSIS — M5412 Radiculopathy, cervical region: Secondary | ICD-10-CM | POA: Diagnosis not present

## 2024-03-16 ENCOUNTER — Other Ambulatory Visit: Payer: Self-pay | Admitting: Internal Medicine

## 2024-03-16 DIAGNOSIS — F411 Generalized anxiety disorder: Secondary | ICD-10-CM

## 2024-03-20 DIAGNOSIS — M25521 Pain in right elbow: Secondary | ICD-10-CM | POA: Diagnosis not present

## 2024-03-20 DIAGNOSIS — Z7689 Persons encountering health services in other specified circumstances: Secondary | ICD-10-CM | POA: Diagnosis not present

## 2024-03-21 DIAGNOSIS — M25512 Pain in left shoulder: Secondary | ICD-10-CM | POA: Diagnosis not present

## 2024-03-21 DIAGNOSIS — Z7689 Persons encountering health services in other specified circumstances: Secondary | ICD-10-CM | POA: Diagnosis not present

## 2024-03-21 DIAGNOSIS — M25511 Pain in right shoulder: Secondary | ICD-10-CM | POA: Diagnosis not present

## 2024-03-23 DIAGNOSIS — Z7689 Persons encountering health services in other specified circumstances: Secondary | ICD-10-CM | POA: Diagnosis not present

## 2024-03-26 DIAGNOSIS — Z7689 Persons encountering health services in other specified circumstances: Secondary | ICD-10-CM | POA: Diagnosis not present

## 2024-03-27 DIAGNOSIS — Z7689 Persons encountering health services in other specified circumstances: Secondary | ICD-10-CM | POA: Diagnosis not present

## 2024-03-28 DIAGNOSIS — M47812 Spondylosis without myelopathy or radiculopathy, cervical region: Secondary | ICD-10-CM | POA: Diagnosis not present

## 2024-03-28 DIAGNOSIS — Z7689 Persons encountering health services in other specified circumstances: Secondary | ICD-10-CM | POA: Diagnosis not present

## 2024-03-29 DIAGNOSIS — Z7689 Persons encountering health services in other specified circumstances: Secondary | ICD-10-CM | POA: Diagnosis not present

## 2024-03-30 DIAGNOSIS — M25521 Pain in right elbow: Secondary | ICD-10-CM | POA: Diagnosis not present

## 2024-03-30 DIAGNOSIS — M25522 Pain in left elbow: Secondary | ICD-10-CM | POA: Diagnosis not present

## 2024-03-30 DIAGNOSIS — Z7689 Persons encountering health services in other specified circumstances: Secondary | ICD-10-CM | POA: Diagnosis not present

## 2024-03-30 DIAGNOSIS — M25532 Pain in left wrist: Secondary | ICD-10-CM | POA: Diagnosis not present

## 2024-03-30 DIAGNOSIS — Z419 Encounter for procedure for purposes other than remedying health state, unspecified: Secondary | ICD-10-CM | POA: Diagnosis not present

## 2024-03-30 DIAGNOSIS — M25531 Pain in right wrist: Secondary | ICD-10-CM | POA: Diagnosis not present

## 2024-04-02 LAB — HM MAMMOGRAPHY: HM Mammogram: NORMAL (ref 0–4)

## 2024-04-02 LAB — HM PAP SMEAR

## 2024-04-03 ENCOUNTER — Ambulatory Visit: Payer: Self-pay

## 2024-04-03 DIAGNOSIS — M25512 Pain in left shoulder: Secondary | ICD-10-CM | POA: Diagnosis not present

## 2024-04-03 DIAGNOSIS — M25532 Pain in left wrist: Secondary | ICD-10-CM | POA: Diagnosis not present

## 2024-04-03 DIAGNOSIS — Z7689 Persons encountering health services in other specified circumstances: Secondary | ICD-10-CM | POA: Diagnosis not present

## 2024-04-03 DIAGNOSIS — M25531 Pain in right wrist: Secondary | ICD-10-CM | POA: Diagnosis not present

## 2024-04-03 DIAGNOSIS — M25522 Pain in left elbow: Secondary | ICD-10-CM | POA: Diagnosis not present

## 2024-04-03 DIAGNOSIS — M25511 Pain in right shoulder: Secondary | ICD-10-CM | POA: Diagnosis not present

## 2024-04-03 DIAGNOSIS — M25521 Pain in right elbow: Secondary | ICD-10-CM | POA: Diagnosis not present

## 2024-04-03 NOTE — Telephone Encounter (Signed)
 FYI Only or Action Required?: FYI only for provider.  Patient was last seen in primary care on 12/22/2023 by Joshua Debby CROME, MD.  Called Nurse Triage reporting Arm Swelling.  Symptoms began today.  Interventions attempted: Rest, hydration, or home remedies.  Symptoms are: unchanged.  Triage Disposition: See Physician Within 24 Hours  Patient/caregiver understands and will follow disposition?: Yes  Copied from CRM [id number]. Topic: Clinical - Red Word Triage >> Apr 03, 2024  3:56 PM Robinson H wrote: [facility] that prompted transfer to Nurse Triage: At physical therapy today and noticed that her left arm is swollen, can't get injection tomorrow until she sees primary for reason arm is swollen Reason for Disposition  MODERATE arm swelling (e.g., puffiness or swollen feeling of entire arm)  Answer Assessment - Initial Assessment Questions 1. ONSET: When did the swelling start? (e.g., minutes, hours, days)     Patient reports noticing swelling today while at physical therapy session 2. LOCATION: What part of the arm is swollen?  Are both arms swollen or just one arm?     Left arm-upper arm 3. SEVERITY: How bad is the swelling? (e.g., localized; mild, moderate, severe)     moderate 4. REDNESS: Is there redness or signs of infection?     no 5. PAIN: Is the swelling painful to touch? If Yes, ask: How painful is it?   (Scale 1-10; mild, moderate or severe)     no 6. FEVER: Do you have a fever? If Yes, ask: What is it, how was it measured, and when did it start?      no 7. CAUSE: What do you think is causing the arm swelling?     unsure 8. MEDICAL HISTORY: Do you have a history of heart failure, kidney disease, liver failure, or cancer?     no 9. RECURRENT SYMPTOM: Have you had arm swelling before? If Yes, ask: When was the last time? What happened that time?     Had swelling post surgery 10. OTHER SYMPTOMS: Do you have any other symptoms? (e.g., chest pain,  difficulty breathing)       no  Protocols used: Arm Swelling and Edema-A-AH

## 2024-04-04 ENCOUNTER — Ambulatory Visit: Admitting: Internal Medicine

## 2024-04-04 VITALS — BP 128/90 | HR 76 | Temp 98.1°F | Ht 65.5 in | Wt 288.2 lb

## 2024-04-04 DIAGNOSIS — I10 Essential (primary) hypertension: Secondary | ICD-10-CM

## 2024-04-04 DIAGNOSIS — M7989 Other specified soft tissue disorders: Secondary | ICD-10-CM

## 2024-04-04 DIAGNOSIS — Z7689 Persons encountering health services in other specified circumstances: Secondary | ICD-10-CM | POA: Diagnosis not present

## 2024-04-04 DIAGNOSIS — E559 Vitamin D deficiency, unspecified: Secondary | ICD-10-CM | POA: Diagnosis not present

## 2024-04-04 DIAGNOSIS — R030 Elevated blood-pressure reading, without diagnosis of hypertension: Secondary | ICD-10-CM | POA: Insufficient documentation

## 2024-04-04 MED ORDER — DOXYCYCLINE HYCLATE 100 MG PO TABS
100.0000 mg | ORAL_TABLET | Freq: Two times a day (BID) | ORAL | 0 refills | Status: DC
Start: 2024-04-04 — End: 2024-05-18

## 2024-04-04 MED ORDER — TRIAMCINOLONE ACETONIDE 0.1 % EX CREA: 1.0000 | TOPICAL_CREAM | Freq: Two times a day (BID) | CUTANEOUS | 1 refills | Status: AC

## 2024-04-04 NOTE — Patient Instructions (Signed)
 Please take all new medication as prescribed - the antibiotic, as well as the cream in a few days if it does not seem to be getting better  Please continue all other medications as before, and refills have been done if requested.  Please have the pharmacy call with any other refills you may need.  Please keep your appointments with your specialists as you may have planned

## 2024-04-04 NOTE — Assessment & Plan Note (Signed)
 Last vitamin D  Lab Results  Component Value Date   VD25OH 9.19 (L) 07/02/2021   Low, to start oral replacement

## 2024-04-04 NOTE — Assessment & Plan Note (Signed)
 Exam c/w cellulitis vs inflammatory area - for doxycycline  100 bid, and topical triam 0.`% cream if not improved

## 2024-04-04 NOTE — Assessment & Plan Note (Signed)
 BP Readings from Last 3 Encounters:  04/04/24 (!) 128/90  12/22/23 132/80  08/15/23 (!) 138/90   Stable, pt to continue medical treatment  - diet, wt control

## 2024-04-04 NOTE — Progress Notes (Signed)
 Patient ID: Jessica Davenport, female   DOB: 12-09-1965, 58 y.o.   MRN: [id number]        Chief Complaint: follow up left arm redness and swelling x 2 days       HPI:  Jessica Davenport is a 58 y.o. female here with c/o left post arm redness, swelling and a couple of lesions maybe pustules, for no apparent reason. Limited to post left arm to wrist and some eryema noted in the bicep area.   Has not been working in the yard and contact dermatitis seems less likely.  But pt has no fever, chills, and little in the way of other symptoms - seems to have little pain overall, and little itching at most.  No other sick contacts  Has been taking mounjaro  2.5 mg - Lost over 15 lbs since last yr  BP seems to have improved somewhat.  Wt Readings from Last 3 Encounters:  04/04/24 288 lb 3.2 oz (130.7 kg)  12/22/23 294 lb 6.4 oz (133.5 kg)  03/31/23 (!) 316 lb (143.3 kg)   BP Readings from Last 3 Encounters:  04/04/24 (!) 128/90  12/22/23 132/80  08/15/23 (!) 138/90         Past Medical History:  Diagnosis Date   Abscess    Allergy    Bilateral lower extremity edema 12/02/2016   Essential hypertension    Fibroid, uterine    Infected sebaceous cyst    mons pubis   Iron deficiency anemia 08/19/2009        Lower extremity edema    Lumbar disc disease    Morbid obesity (HCC)    Obesity, Class III, BMI 40-49.9 (morbid obesity) 02/15/2012   Ostium secundum type atrial septal defect    s/p 28 mm Amplatzer device closure in 2005   Pulmonary HTN (HCC) 08/20/2009        Pure hypercholesterolemia 06/16/2012   Small bowel obstruction (HCC) 2013   Vitamin D  deficiency    Past Surgical History:  Procedure Laterality Date   ABDOMINAL HYSTERECTOMY  12/07/2011   ASD REPAIR  2005/2009   ENDOMETRIAL ABLATION  2010,2011,2012   teeth removal     TUBAL LIGATION  07/19/1994   uterine ablation      reports that she has never smoked. She has never used smokeless tobacco. She reports that she does not drink alcohol  and does not use drugs. family history includes Anxiety disorder in her sister; Breast cancer in her maternal aunt and mother; Cervical cancer in her maternal aunt; Colon cancer in her maternal uncle; Coronary artery disease in her father and mother; Diabetes in her father and mother; Hypertension in her father and mother; Ovarian cancer in her maternal aunt. Allergies  Allergen Reactions   Celery Oil Swelling   Grass Pollen(K-O-R-T-Swt Vern) Hives   Lisinopril  Hives   Other     Celery and green peas   Metformin And Related Diarrhea   Current Outpatient Medications on File Prior to Visit  Medication Sig Dispense Refill   albuterol  (VENTOLIN  HFA) 108 (90 Base) MCG/ACT inhaler INHALE 2 PUFFS BY MOUTH EVERY 6 HOURS AS NEEDED FOR WHEEZING OR SHORTNESS OF BREATH 18 g 1   cetirizine  (ZYRTEC  ALLERGY) 10 MG tablet Take 1 tablet (10 mg total) by mouth at bedtime. 90 tablet 1   clonazePAM  (KLONOPIN ) 1 MG tablet TAKE ONE TABLET BY MOUTH TWICE A DAY AS NEEDED FOR ANXIETY 60 tablet 1   EPINEPHrine 0.3 mg/0.3 mL IJ SOAJ injection Inject  0.3 mg into the muscle as needed for anaphylaxis.     furosemide  (LASIX ) 40 MG tablet Take 40 mg by mouth daily as needed for edema.     meloxicam (MOBIC) 15 MG tablet Take 1 tablet daily with food for 5-7 days, then as needed     omeprazole  (PRILOSEC ) 40 MG capsule Take 1 capsule (40 mg total) by mouth 2 (two) times daily. 180 capsule 1   rosuvastatin  (CRESTOR ) 20 MG tablet Take 1 tablet (20 mg total) by mouth daily. 90 tablet 1   spironolactone  (ALDACTONE ) 50 MG tablet TAKE 1 TABLET BY MOUTH DAILY 90 tablet 0   tirzepatide  (MOUNJARO ) 7.5 MG/0.5ML Pen INJECT 7.5 MG UNDER THE SKIN ONCE WEEKLY 6 mL 0   traMADol  (ULTRAM ) 50 MG tablet Take 1 tablet (50 mg total) by mouth every 6 (six) hours as needed. 60 tablet 0   traZODone  (DESYREL ) 50 MG tablet TAKE 1 TABLET BY MOUTH AT BEDTIME 90 tablet 1   No current facility-administered medications on file prior to visit.         ROS:  All others reviewed and negative.  Objective        PE:  BP (!) 128/90   Pulse 76   Temp 98.1 F (36.7 C)   Ht 5' 5.5 (1.664 m)   Wt 288 lb 3.2 oz (130.7 kg)   LMP 11/10/2011   SpO2 99%   BMI 47.23 kg/m                 Constitutional: Pt appears in NAD               HENT: Head: NCAT.                Right Ear: External ear normal.                 Left Ear: External ear normal.                Eyes: . Pupils are equal, round, and reactive to light. Conjunctivae and EOM are normal               Nose: without d/c or deformity               Neck: Neck supple. Gross normal ROM               Cardiovascular: Normal rate and regular rhythm.                 Pulmonary/Chest: Effort normal and breath sounds without rales or wheezing.                Abd:  Soft, NT, ND, + BS, no organomegaly               Neurological: Pt is alert. At baseline orientation, motor grossly intact               Skin: Skin is warm. LE edema - none, left arm with diffuse post arm redness and swelling with 2 pustule like lesions near the elbow, but no abscess; does has some redness to the left bicep area as well               Psychiatric: Pt behavior is normal without agitation   Micro: none  Cardiac tracings I have personally interpreted today:  none  Pertinent Radiological findings (summarize): none   Lab Results  Component Value Date   WBC 8.1 12/22/2023   HGB 12.9  12/22/2023   HCT 39.6 12/22/2023   PLT 316.0 12/22/2023   GLUCOSE 97 01/27/2024   CHOL 231 (H) 03/31/2023   TRIG 187.0 (H) 03/31/2023   HDL 54.50 03/31/2023   LDLDIRECT 148.1 06/16/2012   LDLCALC 139 (H) 03/31/2023   ALT 14 12/22/2023   AST 16 12/22/2023   NA 139 01/27/2024   K 3.6 01/27/2024   CL 101 01/27/2024   CREATININE 0.86 01/27/2024   BUN 12 01/27/2024   CO2 34 (H) 01/27/2024   TSH 1.39 03/31/2023   HGBA1C 6.3 12/22/2023   MICROALBUR <0.7 12/22/2023   Assessment/Plan:  Jessica Davenport is a 58 y.o. Black or African  American [2] Other or two or more races [6] female with  has a past medical history of Abscess, Allergy, Bilateral lower extremity edema (12/02/2016), Essential hypertension, Fibroid, uterine, Infected sebaceous cyst, Iron deficiency anemia (08/19/2009), Lower extremity edema, Lumbar disc disease, Morbid obesity (HCC), Obesity, Class III, BMI 40-49.9 (morbid obesity) (02/15/2012), Ostium secundum type atrial septal defect, Pulmonary HTN (HCC) (08/20/2009), Pure hypercholesterolemia (06/16/2012), Small bowel obstruction (HCC) (2013), and Vitamin D  deficiency.  Accelerated hypertension BP Readings from Last 3 Encounters:  04/04/24 (!) 128/90  12/22/23 132/80  08/15/23 (!) 138/90   Stable, pt to continue medical treatment  - diet, wt control   Left arm swelling Exam c/w cellulitis vs inflammatory area - for doxycycline  100 bid, and topical triam 0.`% cream if not improved  Vitamin D  deficiency Last vitamin D  Lab Results  Component Value Date   VD25OH 9.19 (L) 07/02/2021   Low, to start oral replacement  Followup: Return if symptoms worsen or fail to improve.  Lynwood Rush, MD 04/04/2024 9:00 PM [facility]

## 2024-04-09 DIAGNOSIS — Z7689 Persons encountering health services in other specified circumstances: Secondary | ICD-10-CM | POA: Diagnosis not present

## 2024-04-10 DIAGNOSIS — Z7689 Persons encountering health services in other specified circumstances: Secondary | ICD-10-CM | POA: Diagnosis not present

## 2024-04-10 DIAGNOSIS — I10 Essential (primary) hypertension: Secondary | ICD-10-CM | POA: Diagnosis not present

## 2024-04-10 DIAGNOSIS — Q211 Atrial septal defect, unspecified: Secondary | ICD-10-CM | POA: Diagnosis not present

## 2024-04-12 DIAGNOSIS — Z7689 Persons encountering health services in other specified circumstances: Secondary | ICD-10-CM | POA: Diagnosis not present

## 2024-04-16 DIAGNOSIS — Z7689 Persons encountering health services in other specified circumstances: Secondary | ICD-10-CM | POA: Diagnosis not present

## 2024-04-26 DIAGNOSIS — Z7689 Persons encountering health services in other specified circumstances: Secondary | ICD-10-CM | POA: Diagnosis not present

## 2024-05-07 DIAGNOSIS — Z7689 Persons encountering health services in other specified circumstances: Secondary | ICD-10-CM | POA: Diagnosis not present

## 2024-05-12 ENCOUNTER — Other Ambulatory Visit: Payer: Self-pay | Admitting: Internal Medicine

## 2024-05-12 DIAGNOSIS — I10 Essential (primary) hypertension: Secondary | ICD-10-CM

## 2024-05-12 DIAGNOSIS — E876 Hypokalemia: Secondary | ICD-10-CM

## 2024-05-14 ENCOUNTER — Other Ambulatory Visit: Payer: Self-pay

## 2024-05-14 ENCOUNTER — Emergency Department (HOSPITAL_COMMUNITY)

## 2024-05-14 ENCOUNTER — Emergency Department (HOSPITAL_COMMUNITY)
Admission: EM | Admit: 2024-05-14 | Discharge: 2024-05-14 | Disposition: A | Discharge status: Home / Self Care | Attending: Emergency Medicine | Admitting: Emergency Medicine

## 2024-05-14 ENCOUNTER — Encounter (HOSPITAL_COMMUNITY): Payer: Self-pay

## 2024-05-14 DIAGNOSIS — M542 Cervicalgia: Secondary | ICD-10-CM | POA: Diagnosis not present

## 2024-05-14 DIAGNOSIS — Y92410 Unspecified street and highway as the place of occurrence of the external cause: Secondary | ICD-10-CM | POA: Diagnosis not present

## 2024-05-14 DIAGNOSIS — I10 Essential (primary) hypertension: Secondary | ICD-10-CM | POA: Diagnosis not present

## 2024-05-14 DIAGNOSIS — S199XXA Unspecified injury of neck, initial encounter: Secondary | ICD-10-CM | POA: Diagnosis not present

## 2024-05-14 DIAGNOSIS — M25552 Pain in left hip: Secondary | ICD-10-CM | POA: Diagnosis not present

## 2024-05-14 DIAGNOSIS — M25572 Pain in left ankle and joints of left foot: Secondary | ICD-10-CM | POA: Diagnosis not present

## 2024-05-14 DIAGNOSIS — M25561 Pain in right knee: Secondary | ICD-10-CM | POA: Insufficient documentation

## 2024-05-14 DIAGNOSIS — M25551 Pain in right hip: Secondary | ICD-10-CM | POA: Insufficient documentation

## 2024-05-14 DIAGNOSIS — M25562 Pain in left knee: Secondary | ICD-10-CM | POA: Diagnosis not present

## 2024-05-14 DIAGNOSIS — M4802 Spinal stenosis, cervical region: Secondary | ICD-10-CM | POA: Diagnosis not present

## 2024-05-14 DIAGNOSIS — M50322 Other cervical disc degeneration at C5-C6 level: Secondary | ICD-10-CM | POA: Diagnosis not present

## 2024-05-14 DIAGNOSIS — S161XXA Strain of muscle, fascia and tendon at neck level, initial encounter: Secondary | ICD-10-CM

## 2024-05-14 DIAGNOSIS — Z79899 Other long term (current) drug therapy: Secondary | ICD-10-CM | POA: Diagnosis not present

## 2024-05-14 DIAGNOSIS — M47812 Spondylosis without myelopathy or radiculopathy, cervical region: Secondary | ICD-10-CM | POA: Diagnosis not present

## 2024-05-14 MED ORDER — OXYCODONE HCL 5 MG PO TABS
5.0000 mg | ORAL_TABLET | Freq: Once | ORAL | Status: AC
  Administered 2024-05-14: 5 mg via ORAL
  Filled 2024-05-14: qty 1

## 2024-05-14 MED ORDER — CLONAZEPAM 0.5 MG PO TABS
1.0000 mg | ORAL_TABLET | Freq: Once | ORAL | Status: AC
Start: 2024-05-14 — End: 2024-05-14
  Administered 2024-05-14: 1 mg via ORAL
  Filled 2024-05-14: qty 2

## 2024-05-14 MED ORDER — METHOCARBAMOL 500 MG PO TABS: 500.0000 mg | ORAL_TABLET | Freq: Three times a day (TID) | ORAL | 0 refills | Status: AC | PRN

## 2024-05-14 NOTE — ED Notes (Signed)
 Patient transported to X-ray
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
## 2024-05-14 NOTE — ED Notes (Signed)
 EMS C-Collar in place

## 2024-05-15 DIAGNOSIS — M25572 Pain in left ankle and joints of left foot: Secondary | ICD-10-CM | POA: Diagnosis not present

## 2024-05-15 DIAGNOSIS — M5412 Radiculopathy, cervical region: Secondary | ICD-10-CM | POA: Diagnosis not present

## 2024-05-15 DIAGNOSIS — M5459 Other low back pain: Secondary | ICD-10-CM | POA: Diagnosis not present

## 2024-05-15 DIAGNOSIS — M542 Cervicalgia: Secondary | ICD-10-CM | POA: Diagnosis not present

## 2024-05-16 DIAGNOSIS — Z7689 Persons encountering health services in other specified circumstances: Secondary | ICD-10-CM | POA: Diagnosis not present

## 2024-05-17 DIAGNOSIS — M542 Cervicalgia: Secondary | ICD-10-CM | POA: Diagnosis not present

## 2024-05-18 ENCOUNTER — Ambulatory Visit: Admitting: Family Medicine

## 2024-05-18 ENCOUNTER — Encounter: Payer: Self-pay | Admitting: Family Medicine

## 2024-05-18 VITALS — BP 126/74 | HR 78 | Temp 97.8°F | Ht 65.5 in | Wt 277.0 lb

## 2024-05-18 DIAGNOSIS — M542 Cervicalgia: Secondary | ICD-10-CM | POA: Diagnosis not present

## 2024-05-18 NOTE — Patient Instructions (Signed)
 Continue the treatment plan Dr. Bonner recommended. Let us  know if there is anything we can do to help you.
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
## 2024-05-29 DIAGNOSIS — Z7689 Persons encountering health services in other specified circumstances: Secondary | ICD-10-CM | POA: Diagnosis not present

## 2024-05-29 DIAGNOSIS — M47812 Spondylosis without myelopathy or radiculopathy, cervical region: Secondary | ICD-10-CM | POA: Diagnosis not present

## 2024-05-31 DIAGNOSIS — M25512 Pain in left shoulder: Secondary | ICD-10-CM | POA: Diagnosis not present

## 2024-05-31 DIAGNOSIS — Z7689 Persons encountering health services in other specified circumstances: Secondary | ICD-10-CM | POA: Diagnosis not present

## 2024-05-31 DIAGNOSIS — M25511 Pain in right shoulder: Secondary | ICD-10-CM | POA: Diagnosis not present

## 2024-06-01 ENCOUNTER — Other Ambulatory Visit: Payer: Self-pay | Admitting: Internal Medicine

## 2024-06-01 DIAGNOSIS — E1169 Type 2 diabetes mellitus with other specified complication: Secondary | ICD-10-CM

## 2024-06-05 DIAGNOSIS — Z7689 Persons encountering health services in other specified circumstances: Secondary | ICD-10-CM | POA: Diagnosis not present

## 2024-06-05 DIAGNOSIS — M25532 Pain in left wrist: Secondary | ICD-10-CM | POA: Diagnosis not present

## 2024-06-05 DIAGNOSIS — M25531 Pain in right wrist: Secondary | ICD-10-CM | POA: Diagnosis not present

## 2024-06-08 DIAGNOSIS — Z7689 Persons encountering health services in other specified circumstances: Secondary | ICD-10-CM | POA: Diagnosis not present

## 2024-06-11 DIAGNOSIS — Z7689 Persons encountering health services in other specified circumstances: Secondary | ICD-10-CM | POA: Diagnosis not present

## 2024-06-12 DIAGNOSIS — Z7689 Persons encountering health services in other specified circumstances: Secondary | ICD-10-CM | POA: Diagnosis not present

## 2024-06-13 DIAGNOSIS — Z7689 Persons encountering health services in other specified circumstances: Secondary | ICD-10-CM | POA: Diagnosis not present

## 2024-06-16 ENCOUNTER — Other Ambulatory Visit: Payer: Self-pay | Admitting: Internal Medicine

## 2024-06-16 DIAGNOSIS — I10 Essential (primary) hypertension: Secondary | ICD-10-CM

## 2024-06-16 DIAGNOSIS — E876 Hypokalemia: Secondary | ICD-10-CM

## 2024-06-18 DIAGNOSIS — Z7689 Persons encountering health services in other specified circumstances: Secondary | ICD-10-CM | POA: Diagnosis not present

## 2024-06-19 DIAGNOSIS — M5416 Radiculopathy, lumbar region: Secondary | ICD-10-CM | POA: Diagnosis not present

## 2024-06-22 DIAGNOSIS — Z7689 Persons encountering health services in other specified circumstances: Secondary | ICD-10-CM | POA: Diagnosis not present

## 2024-06-24 ENCOUNTER — Other Ambulatory Visit: Payer: Self-pay | Admitting: Internal Medicine

## 2024-06-24 DIAGNOSIS — E785 Hyperlipidemia, unspecified: Secondary | ICD-10-CM

## 2024-06-28 DIAGNOSIS — I10 Essential (primary) hypertension: Secondary | ICD-10-CM | POA: Diagnosis not present

## 2024-06-28 DIAGNOSIS — Z6841 Body Mass Index (BMI) 40.0 and over, adult: Secondary | ICD-10-CM | POA: Diagnosis not present

## 2024-06-28 DIAGNOSIS — Q211 Atrial septal defect, unspecified: Secondary | ICD-10-CM | POA: Diagnosis not present

## 2024-06-28 DIAGNOSIS — Z7689 Persons encountering health services in other specified circumstances: Secondary | ICD-10-CM | POA: Diagnosis not present

## 2024-07-03 ENCOUNTER — Ambulatory Visit: Admitting: Internal Medicine

## 2024-07-03 VITALS — BP 122/86 | HR 77 | Temp 98.2°F | Ht 65.5 in | Wt 274.2 lb

## 2024-07-03 DIAGNOSIS — E1169 Type 2 diabetes mellitus with other specified complication: Secondary | ICD-10-CM

## 2024-07-03 DIAGNOSIS — E785 Hyperlipidemia, unspecified: Secondary | ICD-10-CM

## 2024-07-03 DIAGNOSIS — I272 Pulmonary hypertension, unspecified: Secondary | ICD-10-CM | POA: Diagnosis not present

## 2024-07-03 LAB — CBC WITH DIFFERENTIAL/PLATELET
Basophils Absolute: 0 K/uL (ref 0.0–0.1)
Basophils Relative: 0.3 % (ref 0.0–3.0)
Eosinophils Absolute: 0 K/uL (ref 0.0–0.7)
Eosinophils Relative: 0.4 % (ref 0.0–5.0)
HCT: 38.2 % (ref 36.0–46.0)
Hemoglobin: 12.6 g/dL (ref 12.0–15.0)
Lymphocytes Relative: 44.6 % (ref 12.0–46.0)
Lymphs Abs: 3 K/uL (ref 0.7–4.0)
MCHC: 33 g/dL (ref 30.0–36.0)
MCV: 88.1 fl (ref 78.0–100.0)
Monocytes Absolute: 0.4 K/uL (ref 0.1–1.0)
Monocytes Relative: 5.4 % (ref 3.0–12.0)
Neutro Abs: 3.3 K/uL (ref 1.4–7.7)
Neutrophils Relative %: 49.3 % (ref 43.0–77.0)
Platelets: 295 K/uL (ref 150.0–400.0)
RBC: 4.34 Mil/uL (ref 3.87–5.11)
RDW: 14.6 % (ref 11.5–15.5)
WBC: 6.7 K/uL (ref 4.0–10.5)

## 2024-07-03 LAB — HEPATIC FUNCTION PANEL
ALT: 12 U/L (ref 3–35)
AST: 14 U/L (ref 5–37)
Albumin: 4.1 g/dL (ref 3.5–5.2)
Alkaline Phosphatase: 63 U/L (ref 39–117)
Bilirubin, Direct: 0 mg/dL — ABNORMAL LOW (ref 0.1–0.3)
Total Bilirubin: 0.4 mg/dL (ref 0.2–1.2)
Total Protein: 7.7 g/dL (ref 6.0–8.3)

## 2024-07-03 LAB — BASIC METABOLIC PANEL WITH GFR
BUN: 13 mg/dL (ref 6–23)
CO2: 30 meq/L (ref 19–32)
Calcium: 9.3 mg/dL (ref 8.4–10.5)
Chloride: 102 meq/L (ref 96–112)
Creatinine, Ser: 0.82 mg/dL (ref 0.40–1.20)
GFR: 80.06 mL/min (ref >60.00)
Glucose, Bld: 93 mg/dL (ref 70–99)
Potassium: 3.7 meq/L (ref 3.5–5.1)
Sodium: 139 meq/L (ref 135–145)

## 2024-07-03 LAB — LIPID PANEL
Cholesterol: 220 mg/dL — ABNORMAL HIGH (ref 28–200)
HDL: 50.9 mg/dL (ref >39.00)
LDL Cholesterol: 151 mg/dL — ABNORMAL HIGH (ref 10–99)
NonHDL: 168.64
Total CHOL/HDL Ratio: 4
Triglycerides: 88 mg/dL (ref 10.0–149.0)
VLDL: 17.6 mg/dL (ref 0.0–40.0)

## 2024-07-03 LAB — MICROALBUMIN / CREATININE URINE RATIO
Creatinine,U: 128.4 mg/dL
Microalb Creat Ratio: UNDETERMINED mg/g (ref 0.0–30.0)
Microalb, Ur: <0.7 mg/dL

## 2024-07-03 LAB — CK: Total CK: 126 U/L (ref 17–177)

## 2024-07-03 LAB — TSH: TSH: 1.23 u[IU]/mL (ref 0.35–5.50)

## 2024-07-03 LAB — HEMOGLOBIN A1C: Hgb A1c MFr Bld: 5.6 % (ref 4.6–6.5)

## 2024-07-03 NOTE — Patient Instructions (Signed)
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
## 2024-07-04 ENCOUNTER — Ambulatory Visit: Payer: Self-pay | Admitting: Internal Medicine

## 2024-07-04 DIAGNOSIS — Z7689 Persons encountering health services in other specified circumstances: Secondary | ICD-10-CM | POA: Diagnosis not present

## 2024-07-04 LAB — URINALYSIS, ROUTINE W REFLEX MICROSCOPIC
Bilirubin Urine: NEGATIVE
Hgb urine dipstick: NEGATIVE
Ketones, ur: NEGATIVE
Leukocytes,Ua: NEGATIVE
Nitrite: NEGATIVE
RBC / HPF: NONE SEEN (ref 0–?)
Specific Gravity, Urine: 1.01 (ref 1.000–1.030)
Total Protein, Urine: NEGATIVE
Urine Glucose: NEGATIVE
Urobilinogen, UA: 0.2 (ref 0.0–1.0)
pH: 6.5 (ref 5.0–8.0)

## 2024-07-04 LAB — HEPATITIS B SURFACE ANTIBODY, QUANTITATIVE: Hep B S AB Quant (Post): 14 m[IU]/mL (ref >=10)

## 2024-07-04 MED ORDER — ROSUVASTATIN CALCIUM 20 MG PO TABS: 20.0000 mg | ORAL_TABLET | Freq: Every day | ORAL | 1 refills | Status: AC

## 2024-07-04 MED ORDER — SHINGRIX 50 MCG/0.5ML IM SUSR: 0.5000 mL | Freq: Once | INTRAMUSCULAR | 1 refills | Status: AC

## 2024-07-05 DIAGNOSIS — Z7689 Persons encountering health services in other specified circumstances: Secondary | ICD-10-CM | POA: Diagnosis not present

## 2024-07-16 ENCOUNTER — Telehealth: Payer: Self-pay | Admitting: *Deleted

## 2024-07-16 ENCOUNTER — Other Ambulatory Visit: Payer: Self-pay | Admitting: Internal Medicine

## 2024-07-16 DIAGNOSIS — I10 Essential (primary) hypertension: Secondary | ICD-10-CM

## 2024-07-16 DIAGNOSIS — E876 Hypokalemia: Secondary | ICD-10-CM

## 2024-07-16 NOTE — Progress Notes (Signed)
 Care Guide Pharmacy Note  07/16/2024 Name: Jessica Davenport MRN: [id number] DOB: [birth year]  Referred By: Joshua Debby CROME, MD Reason for referral: Call Attempt #1 and Complex Care Management (Outreach to schedule referral with pharmacist )   Jessica Davenport is a 58 y.o. year old female who is a primary care patient of Joshua Debby CROME, MD.  Jessica Davenport was referred to the pharmacist for assistance related to: HLD  Successful contact was made with the patient to discuss pharmacy services.  Patient declines engagement at this time. Contact information was provided to the patient should they wish to reach out for assistance at a later time. Says she would f/u in mychart   Thedford Franks, CMA [facility]  [facility], [facility] Guide Direct Dial: [phone]  Fax: [phone] Website: [email or web address]

## 2024-07-16 NOTE — Progress Notes (Signed)
 Care Guide Pharmacy Note  07/16/2024 Name: LONDYNN SONODA MRN: [id number] DOB: 19-Dec-1965  Referred By: Joshua Debby CROME, MD Reason for referral: Call Attempt #1 and Complex Care Management (Outreach to schedule referral with pharmacist )   Othelia ANELIA CARRIVEAU is a 58 y.o. year old female who is a primary care patient of Joshua Debby CROME, MD.  Lenn CINDERELLA Hamburger was referred to the pharmacist for assistance related to: HLD  An unsuccessful telephone outreach was attempted today to contact the patient who was referred to the pharmacy team for assistance with medication management. Additional attempts will be made to contact the patient.  Thedford Franks, CMA [place]  [facility], [facility] Guide Direct Dial: [phone]  Fax: [phone] Website: [email or web address]
